# Patient Record
Sex: Female | Born: 1935 | Race: White | Hispanic: No | Smoking: Never smoker
Health system: Southern US, Community
[De-identification: ages and names within clinical notes are randomized; demographics above are authoritative.]

## PROBLEM LIST (undated history)

## (undated) DIAGNOSIS — I509 Heart failure, unspecified: Secondary | ICD-10-CM

## (undated) DIAGNOSIS — J189 Pneumonia, unspecified organism: Secondary | ICD-10-CM

## (undated) DIAGNOSIS — I10 Essential (primary) hypertension: Secondary | ICD-10-CM

## (undated) DIAGNOSIS — M545 Low back pain, unspecified: Secondary | ICD-10-CM

## (undated) DIAGNOSIS — E119 Type 2 diabetes mellitus without complications: Secondary | ICD-10-CM

## (undated) DIAGNOSIS — Z950 Presence of cardiac pacemaker: Secondary | ICD-10-CM

## (undated) DIAGNOSIS — I219 Acute myocardial infarction, unspecified: Secondary | ICD-10-CM

## (undated) DIAGNOSIS — M109 Gout, unspecified: Secondary | ICD-10-CM

## (undated) DIAGNOSIS — J90 Pleural effusion, not elsewhere classified: Secondary | ICD-10-CM

## (undated) DIAGNOSIS — R0602 Shortness of breath: Secondary | ICD-10-CM

## (undated) DIAGNOSIS — M1990 Unspecified osteoarthritis, unspecified site: Secondary | ICD-10-CM

## (undated) DIAGNOSIS — Z9289 Personal history of other medical treatment: Secondary | ICD-10-CM

## (undated) DIAGNOSIS — E7800 Pure hypercholesterolemia, unspecified: Secondary | ICD-10-CM

## (undated) DIAGNOSIS — G8929 Other chronic pain: Secondary | ICD-10-CM

## (undated) DIAGNOSIS — E039 Hypothyroidism, unspecified: Secondary | ICD-10-CM

## (undated) DIAGNOSIS — N184 Chronic kidney disease, stage 4 (severe): Secondary | ICD-10-CM

## (undated) HISTORY — PX: JOINT REPLACEMENT: SHX530

## (undated) HISTORY — PX: INSERT / REPLACE / REMOVE PACEMAKER: SUR710

---

## 1993-09-07 DIAGNOSIS — I219 Acute myocardial infarction, unspecified: Secondary | ICD-10-CM

## 1993-09-07 HISTORY — DX: Acute myocardial infarction, unspecified: I21.9

## 1993-09-07 HISTORY — PX: CORONARY ANGIOPLASTY: SHX604

## 1998-04-20 ENCOUNTER — Emergency Department (HOSPITAL_COMMUNITY): Admission: EM | Admit: 1998-04-20 | Discharge: 1998-04-20 | Payer: Self-pay | Admitting: Emergency Medicine

## 1998-09-07 HISTORY — PX: CORONARY ANGIOPLASTY WITH STENT PLACEMENT: SHX49

## 1999-03-31 ENCOUNTER — Observation Stay (HOSPITAL_COMMUNITY): Admission: AD | Admit: 1999-03-31 | Discharge: 1999-04-01 | Payer: Self-pay | Admitting: Cardiology

## 1999-08-25 ENCOUNTER — Emergency Department (HOSPITAL_COMMUNITY): Admission: EM | Admit: 1999-08-25 | Discharge: 1999-08-25 | Payer: Self-pay | Admitting: Emergency Medicine

## 1999-12-31 ENCOUNTER — Encounter: Admission: RE | Admit: 1999-12-31 | Discharge: 1999-12-31 | Payer: Self-pay | Admitting: Family Medicine

## 1999-12-31 ENCOUNTER — Encounter: Payer: Self-pay | Admitting: Family Medicine

## 2000-07-09 ENCOUNTER — Encounter: Payer: Self-pay | Admitting: Family Medicine

## 2000-07-09 ENCOUNTER — Encounter: Admission: RE | Admit: 2000-07-09 | Discharge: 2000-07-09 | Payer: Self-pay | Admitting: Family Medicine

## 2000-12-21 ENCOUNTER — Encounter: Admission: RE | Admit: 2000-12-21 | Discharge: 2000-12-21 | Payer: Self-pay | Admitting: Family Medicine

## 2000-12-21 ENCOUNTER — Encounter: Payer: Self-pay | Admitting: Family Medicine

## 2001-04-12 ENCOUNTER — Encounter: Payer: Self-pay | Admitting: Neurosurgery

## 2001-04-12 ENCOUNTER — Encounter: Admission: RE | Admit: 2001-04-12 | Discharge: 2001-04-12 | Payer: Self-pay | Admitting: Neurosurgery

## 2001-04-26 ENCOUNTER — Encounter: Admission: RE | Admit: 2001-04-26 | Discharge: 2001-04-26 | Payer: Self-pay | Admitting: Neurosurgery

## 2001-04-26 ENCOUNTER — Encounter: Payer: Self-pay | Admitting: Neurosurgery

## 2001-05-10 ENCOUNTER — Encounter: Payer: Self-pay | Admitting: Neurosurgery

## 2001-05-10 ENCOUNTER — Encounter: Admission: RE | Admit: 2001-05-10 | Discharge: 2001-05-10 | Payer: Self-pay | Admitting: Neurosurgery

## 2002-01-26 ENCOUNTER — Emergency Department (HOSPITAL_COMMUNITY): Admission: EM | Admit: 2002-01-26 | Discharge: 2002-01-26 | Payer: Self-pay

## 2002-03-17 ENCOUNTER — Encounter: Admission: RE | Admit: 2002-03-17 | Discharge: 2002-03-17 | Payer: Self-pay | Admitting: Family Medicine

## 2002-03-17 ENCOUNTER — Encounter: Payer: Self-pay | Admitting: Family Medicine

## 2002-03-20 ENCOUNTER — Encounter: Payer: Self-pay | Admitting: Nephrology

## 2002-03-20 ENCOUNTER — Encounter: Admission: RE | Admit: 2002-03-20 | Discharge: 2002-03-20 | Payer: Self-pay | Admitting: Nephrology

## 2006-11-05 ENCOUNTER — Encounter: Admission: RE | Admit: 2006-11-05 | Discharge: 2006-11-05 | Payer: Self-pay | Admitting: Family Medicine

## 2006-11-18 ENCOUNTER — Encounter: Admission: RE | Admit: 2006-11-18 | Discharge: 2006-11-18 | Payer: Self-pay | Admitting: Family Medicine

## 2007-04-15 ENCOUNTER — Ambulatory Visit: Admission: RE | Admit: 2007-04-15 | Discharge: 2007-04-15 | Payer: Self-pay | Admitting: General Surgery

## 2007-06-08 HISTORY — PX: HERNIA REPAIR: SHX51

## 2007-06-09 ENCOUNTER — Encounter (INDEPENDENT_AMBULATORY_CARE_PROVIDER_SITE_OTHER): Payer: Self-pay | Admitting: General Surgery

## 2007-06-11 ENCOUNTER — Inpatient Hospital Stay (HOSPITAL_COMMUNITY): Admission: AD | Admit: 2007-06-11 | Discharge: 2007-06-13 | Payer: Self-pay | Admitting: General Surgery

## 2007-07-09 HISTORY — PX: ABDOMINAL WALL MESH  REMOVAL: SHX1116

## 2007-08-01 ENCOUNTER — Inpatient Hospital Stay (HOSPITAL_COMMUNITY): Admission: RE | Admit: 2007-08-01 | Discharge: 2007-08-03 | Payer: Self-pay | Admitting: General Surgery

## 2007-09-08 HISTORY — PX: TOTAL HIP ARTHROPLASTY: SHX124

## 2007-09-21 ENCOUNTER — Encounter: Admission: RE | Admit: 2007-09-21 | Discharge: 2007-09-21 | Payer: Self-pay | Admitting: Anesthesiology

## 2007-12-14 ENCOUNTER — Encounter: Admission: RE | Admit: 2007-12-14 | Discharge: 2007-12-14 | Payer: Self-pay | Admitting: Family Medicine

## 2008-05-16 ENCOUNTER — Encounter: Admission: RE | Admit: 2008-05-16 | Discharge: 2008-05-16 | Payer: Self-pay | Admitting: Family Medicine

## 2008-07-16 ENCOUNTER — Inpatient Hospital Stay (HOSPITAL_COMMUNITY): Admission: RE | Admit: 2008-07-16 | Discharge: 2008-07-19 | Payer: Self-pay | Admitting: Orthopedic Surgery

## 2008-09-07 DIAGNOSIS — Z9289 Personal history of other medical treatment: Secondary | ICD-10-CM

## 2008-09-07 HISTORY — PX: TOTAL HIP ARTHROPLASTY: SHX124

## 2008-09-07 HISTORY — DX: Personal history of other medical treatment: Z92.89

## 2008-10-02 ENCOUNTER — Encounter: Admission: RE | Admit: 2008-10-02 | Discharge: 2008-10-02 | Payer: Self-pay | Admitting: Cardiology

## 2008-10-10 ENCOUNTER — Ambulatory Visit (HOSPITAL_COMMUNITY): Admission: RE | Admit: 2008-10-10 | Discharge: 2008-10-11 | Payer: Self-pay | Admitting: Cardiology

## 2009-01-29 ENCOUNTER — Inpatient Hospital Stay (HOSPITAL_COMMUNITY): Admission: RE | Admit: 2009-01-29 | Discharge: 2009-02-01 | Payer: Self-pay | Admitting: Orthopedic Surgery

## 2009-07-18 ENCOUNTER — Encounter: Admission: RE | Admit: 2009-07-18 | Discharge: 2009-07-18 | Payer: Self-pay | Admitting: General Surgery

## 2010-12-16 LAB — GLUCOSE, CAPILLARY
Glucose-Capillary: 135 mg/dL — ABNORMAL HIGH (ref 70–99)
Glucose-Capillary: 158 mg/dL — ABNORMAL HIGH (ref 70–99)
Glucose-Capillary: 172 mg/dL — ABNORMAL HIGH (ref 70–99)
Glucose-Capillary: 190 mg/dL — ABNORMAL HIGH (ref 70–99)
Glucose-Capillary: 238 mg/dL — ABNORMAL HIGH (ref 70–99)
Glucose-Capillary: 87 mg/dL (ref 70–99)

## 2010-12-16 LAB — DIFFERENTIAL
Basophils Absolute: 0 10*3/uL (ref 0.0–0.1)
Lymphocytes Relative: 21 % (ref 12–46)

## 2010-12-16 LAB — URINALYSIS, ROUTINE W REFLEX MICROSCOPIC
Glucose, UA: NEGATIVE mg/dL
Hgb urine dipstick: NEGATIVE
Ketones, ur: NEGATIVE mg/dL
Specific Gravity, Urine: 1.018 (ref 1.005–1.030)
Urobilinogen, UA: 1 mg/dL (ref 0.0–1.0)

## 2010-12-16 LAB — CBC
HCT: 28 % — ABNORMAL LOW (ref 36.0–46.0)
HCT: 40.7 % (ref 36.0–46.0)
Hemoglobin: 13.4 g/dL (ref 12.0–15.0)
Hemoglobin: 9 g/dL — ABNORMAL LOW (ref 12.0–15.0)
MCHC: 33.4 g/dL (ref 30.0–36.0)
MCV: 92.1 fL (ref 78.0–100.0)
Platelets: 144 10*3/uL — ABNORMAL LOW (ref 150–400)
RBC: 2.95 MIL/uL — ABNORMAL LOW (ref 3.87–5.11)
RDW: 13.2 % (ref 11.5–15.5)
WBC: 10.8 10*3/uL — ABNORMAL HIGH (ref 4.0–10.5)
WBC: 10.8 10*3/uL — ABNORMAL HIGH (ref 4.0–10.5)

## 2010-12-16 LAB — BASIC METABOLIC PANEL
BUN: 24 mg/dL — ABNORMAL HIGH (ref 6–23)
BUN: 26 mg/dL — ABNORMAL HIGH (ref 6–23)
BUN: 26 mg/dL — ABNORMAL HIGH (ref 6–23)
BUN: 37 mg/dL — ABNORMAL HIGH (ref 6–23)
CO2: 27 mEq/L (ref 19–32)
CO2: 28 mEq/L (ref 19–32)
Calcium: 7.5 mg/dL — ABNORMAL LOW (ref 8.4–10.5)
Calcium: 7.5 mg/dL — ABNORMAL LOW (ref 8.4–10.5)
Calcium: 9.1 mg/dL (ref 8.4–10.5)
Calcium: 9.4 mg/dL (ref 8.4–10.5)
Chloride: 101 mEq/L (ref 96–112)
Chloride: 103 mEq/L (ref 96–112)
Chloride: 103 mEq/L (ref 96–112)
Chloride: 99 mEq/L (ref 96–112)
Creatinine, Ser: 1.33 mg/dL — ABNORMAL HIGH (ref 0.4–1.2)
Creatinine, Ser: 1.52 mg/dL — ABNORMAL HIGH (ref 0.4–1.2)
Creatinine, Ser: 1.6 mg/dL — ABNORMAL HIGH (ref 0.4–1.2)
Creatinine, Ser: 2.33 mg/dL — ABNORMAL HIGH (ref 0.4–1.2)
GFR calc Af Amer: 25 mL/min — ABNORMAL LOW (ref >60)
GFR calc Af Amer: 44 mL/min — ABNORMAL LOW (ref >60)
GFR calc non Af Amer: 20 mL/min — ABNORMAL LOW (ref >60)
GFR calc non Af Amer: 33 mL/min — ABNORMAL LOW (ref >60)
Glucose, Bld: 179 mg/dL — ABNORMAL HIGH (ref 70–99)
Glucose, Bld: 211 mg/dL — ABNORMAL HIGH (ref 70–99)
Glucose, Bld: 98 mg/dL (ref 70–99)
Potassium: 3.7 mEq/L (ref 3.5–5.1)
Potassium: 4.7 mEq/L (ref 3.5–5.1)
Potassium: 5.5 mEq/L — ABNORMAL HIGH (ref 3.5–5.1)
Potassium: 6 mEq/L — ABNORMAL HIGH (ref 3.5–5.1)
Sodium: 132 mEq/L — ABNORMAL LOW (ref 135–145)
Sodium: 133 mEq/L — ABNORMAL LOW (ref 135–145)
Sodium: 142 mEq/L (ref 135–145)

## 2010-12-16 LAB — HEMOGLOBIN AND HEMATOCRIT, BLOOD
HCT: 25.3 % — ABNORMAL LOW (ref 36.0–46.0)
Hemoglobin: 8.3 g/dL — ABNORMAL LOW (ref 12.0–15.0)

## 2010-12-16 LAB — PROTIME-INR
INR: 1 (ref 0.00–1.49)
Prothrombin Time: 13.9 seconds (ref 11.6–15.2)

## 2010-12-16 LAB — TYPE AND SCREEN: ABO/RH(D): O POS

## 2010-12-16 LAB — URINE MICROSCOPIC-ADD ON

## 2010-12-16 LAB — APTT: aPTT: 29 seconds (ref 24–37)

## 2010-12-23 LAB — GLUCOSE, CAPILLARY
Glucose-Capillary: 129 mg/dL — ABNORMAL HIGH (ref 70–99)
Glucose-Capillary: 149 mg/dL — ABNORMAL HIGH (ref 70–99)
Glucose-Capillary: 57 mg/dL — ABNORMAL LOW (ref 70–99)

## 2011-01-20 NOTE — Discharge Summary (Signed)
Miranda Cruz, Miranda Cruz NO.:  [id number]   MEDICAL RECORD NO.:  [id number]          PATIENT TYPE:  INP   LOCATION:  1605                         FACILITY:  [facility]   PHYSICIAN:  Madlyn Frankel. Charlann Boxer, M.D.  DATE OF BIRTH:  20-Nov-1929   DATE OF ADMISSION:  07/16/2008  DATE OF DISCHARGE:  07/19/2008                               DISCHARGE SUMMARY   ADMISSION DIAGNOSES:  1. Osteoarthritis.  2. Hypertension.  3. Coronary artery disease.  4. Congestive heart failure.  5. Impaired hearing.  6. Diabetes.  7. Gout.   DISCHARGE DIAGNOSES:  1. Osteoarthritis.  2. Hypertension.  3. Coronary artery disease.  4. Congestive heart failure.  5. Impaired hearing.  6. Diabetes.  7. Gout.   HISTORY OF PRESENT ILLNESS:  A 77 year old female with a history of left  hip sit pain secondary to osteoarthritis.   PROCEDURE:  Left total hip replacement by surgeon Dr. Durene Romans,  assisted by Dwyane Luo, PA-C.   CONSULTATIONS:  None.   LABORATORY DATA:  CBC tracked over the course of her stay.  Final  reading showed white blood cells 10.2, hemoglobin 9.5, hematocrit 29,  platelet count 163,000.  Metabolic panel:  Sodium 139, potassium 3.7,  BUN 40, creatinine 1.69, glucose 50.  Coag's all within normal limits.  Urinalysis showed rare bacteria, negative for nitrites.  Calcium 7.8.   Radiology:  Chest, two view showed mild cardiomegaly, no active disease.   Cardiology, EKG showed sinus bradycardia with a first degree AV block,  left anterior fascicular block.   HOSPITAL COURSE:  The patient was admitted to the hospital and underwent  left total hip replacement and tolerated the procedure well.  The  patient was admitted to the orthopedic floor.  Her stay was unremarkable  although she made minimal progress throughout.  She did have some  hypotension and was given some volume.  She does have a history of  chronic renal insufficiency, borderline, which remained stable  throughout.   Her dressing was changed after day #1 with no significant  drainage from the wound.  She did have a couple hypoglycemic events.  Due to her limited progress, she was a candidate for a skilled nursing  facility rehab until she was able to safely ambulate.  Seen on July 19, 2008 she was ready to go, afebrile, dressing was changed, no active  drainage, neurovascularly intact.  She was weight-bearing as tolerated  with the use of a rolling walker and was ready to go.   DISPOSITION:  Discharged to skilled nursing facility rehab in stable and  improved condition.   DISCHARGE MEDICATIONS:  1. Lovenox 40 mg subcutaneously q.24h. x 11 days and then stop.  2. Enteric coated aspirin 325 mg one p.o. daily x 4 weeks after      Lovenox completed.  3. Robaxin 500 mg one p.o. q.6h. muscle spasms/pain.  4. Vicodin 7.5/325 one to two p.o. q.4h. for pain.  5. Iron 325 mg one p.o. t.i.d. x  3 weeks.  6. Colace 100 mg one p.o. b.i.d. constipation.  7. MiraLax 17 grams p.o. daily p.r.n. constipation while on narcotics.  8. Glyburide 5 mg one p.o. q.a.m.  9. Lipitor 20 mg one p.o. q.a.m.  10.Lisinopril 10 mg one p.o. q.a.m., hold while systolic less than      120.  11.Metoprolol 25 mg one p.o. q.a.m. and one p.o. q.p.m.  12.Furosemide 80 mg one p.o. q.a.m. and one p.o. q.p.m. if needed.  13.Allopurinol 100 mg two p.o. q.a.m.  14.Lovaza 1 gram, four q.a.m.  Discontinue if any bleeding problems.  15.Alprazolam 1 mg one p.o. nightly.  16.Qualaquin 324 mg one p.o. nightly p.r.n.  17.Levothyroxine 50 mcg one p.o. q.a.m.  18.Pepcid over the counter p.r.n.  19.Benadryl 25 mg one p.o. q.6h. p.r.n. itching.  20.Co-Q10 100 mg p.r.n.  21.Vitamin D, unknown dosage amount p.r.n.   ACTIVITY:  Physical therapy, weight-bearing as tolerated with use of her  own walker.  Goals of physical therapy will be to increase strength,  decrease pain and encourage independence of activities of daily living.  Increase her  strength and ability to get from a sitting to standing  position independently.   DISCHARGE WOUND CARE:  Keep dry.   DISCHARGE DIET:  She is diabetic, carb-modified diet.   DISCHARGE FOLLOWUP:  Follow up with Dr. Charlann Boxer at phone number [phone] in  two weeks for a wound check.     ______________________________  Yetta Glassman. [address]oreta [address], [place]      Madlyn Frankel. Charlann Boxer, M.D.  Electronically Signed    BLM/MEDQ  D:  07/19/2008  T:  07/19/2008  Job:  [id number]   cc:   Francisca December, M.D.  Fax: [phone]   Donia Guiles, M.D.  Fax: [phone]   Tamera C. Lewis, N.P.   Please fax copies to all.

## 2011-01-20 NOTE — H&P (Signed)
NAMERITAL, CAVEY NO.:  [id number]   MEDICAL RECORD NO.:  [id number]           PATIENT TYPE:   LOCATION:                                 FACILITY:   PHYSICIAN:  Madlyn Frankel. Charlann Boxer, M.D.  DATE OF BIRTH:  1930/02/07   DATE OF ADMISSION:  DATE OF DISCHARGE:                              HISTORY & PHYSICAL   Procedure will be a right total hip replacement.   CHIEF COMPLAINT:  Right hip pain.   HISTORY OF PRESENT ILLNESS:  This is a 77 year old female with a history  of right hip pain secondary to osteoarthritis.  It has been refractory  to all conservative treatment.  She does have a history of a left total  hip replacement November 2009 and has done very well with this.  She  does state she has had a pacemaker placed since that left total hip  replacement back in November 2009.  Other than the pacemaker, she has no  other recent significant health changes.   PAST MEDICAL HISTORY:  1. Osteoarthritis.  2. Coronary artery disease.  3. Hypertension.  4. Congestive heart failure.  5. Impaired hearing.  6. Diabetes.  7. Gout.  8. Pacemaker placement.   PAST SURGICAL HISTORY:  1. Pacemaker in 2010.  2. Left total hip placement in November 2009.  3. Hernia repair in 2008.   FAMILY HISTORY:  Kidney disease, appendicitis, stroke.   SOCIAL HISTORY:  She is widowed, a nonsmoker.  She does have family for  postoperative care in the home.   PRIMARY CARE PHYSICIAN:  Donia Guiles, M.D.   OTHER PHYSICIANS:  Include Francisca December, M.D., cardiologist, and Kathrin Penner. Vear Clock, M.D. for pain management.   MEDICATIONS:  1. Glyburide 5 mg 1 p.o. q.a.m.  2. Lipitor 20 mg 1 p.o. q.a.m.  3. Lisinopril 10 mg 1 p.o. q.a.m.  4. Levothyroxine 50 mcg 1-1/2 each morning.  5. Furosemide 80 mg 1 p.o. q.a.m. and bedtime as needed in the      evening.  6. Oxycodone 5 mg 1-2 p.o. q. 4-6 p.r.n.  7. Cyclobenzaprine 10 mg 1 p.o. q. 12 p.r.n.  8. Alprazolam 1 mg bedtime p.r.n.  9. Allopurinol 100 mg 2 p.o. daily.  10.Lovaza 1 g 4 per day.  11.Qualaquin 324 mg p.o. bedtime p.r.n.  12.Pantoprazole 40 mg 1 p.o. q.a.m.  13.Aspirin 325 mg 1 p.o. q.a.m.  14.Stool softeners p.r.n.  15.Benadryl p.r.n.  16.Aleve p.r.n.  17.CoQ-10 100 mg p.r.n.  18.Vitamin D p.r.n.   DRUG ALLERGIES:  To include PHENERGAN and MORPHINE.   REVIEW OF SYSTEMS:  HEENT:  She has hearing loss, wears dentures.  DERMATOLOGY:  She has intermittent rashes.  RESPIRATORY:  She does have wheezing.  MUSCULOSKELETAL:  She has joint pain, back pain, morning stiffness.  Otherwise see HPI.   PHYSICAL EXAMINATION:  Pulse 64, respirations 16, blood pressure 148/76.  GENERAL:  Awake, alert and oriented.  HEENT:  Normocephalic.  NECK:  Supple.  No carotid bruit.  CHEST:  Right lungs are clear to auscultation.  Left lungs  upper and  lower have inspiratory wheeze and rub.  BREASTS:  Deferred.  HEART:  S1-S2 distinct.  ABDOMEN:  Soft, bowel sounds present.  PELVIS:  Stable.  GENITOURINARY:  Deferred.  EXTREMITIES:  Right hip has increased pain with range of motion.  SKIN:  Posterolateral scar left hip.  Right hip.  No cellulitis.  No  skin breakdown.  NEUROLOGIC:  Intact distal sensibilities.   LABORATORY DATA:  Labs, EKG, chest x-ray pending presurgical testing.   IMPRESSION:  Right hip osteoarthritis.   PLAN OF ACTION:  Right total hip replacement by Dr. Charlann Boxer at [facility]  on Jan 29, 2009.  Risks and complications were discussed.   The patient is planning on home health care physical therapy, and  Lovenox was provided at today's history and physical as well as other  postoperative medications.     ______________________________  Yetta Glassman [address], [place]      Madlyn Frankel. Charlann Boxer, M.D.  Electronically Signed    BLM/MEDQ  D:  01/18/2009  T:  01/18/2009  Job:  [id number]   cc:   Donia Guiles, M.D.  Fax: [phone]   Francisca December, M.D.  Fax: [phone]   Mark L. Vear Clock, M.D.  Fax:  [phone]

## 2011-01-20 NOTE — Op Note (Signed)
Miranda Cruz, Miranda Cruz NO.:  [id number]   MEDICAL RECORD NO.:  [id number]          PATIENT TYPE:  OIB   LOCATION:  5711                         FACILITY:  [facility]   PHYSICIAN:  Ollen Gross. Vernell Morgans, M.D. DATE OF BIRTH:  Oct 09, 1929   DATE OF PROCEDURE:  06/09/2007  DATE OF DISCHARGE:                               OPERATIVE REPORT   PREOPERATIVE DIAGNOSIS:  Ventral hernia.   POSTOPERATIVE DIAGNOSIS:  Ventral hernia.   PROCEDURES:  Ventral hernia repair with mesh.   SURGEON:  Ollen Gross. Vernell Morgans, M.D.   ASSISTANTMarcial Pacas E. Earlene Plater, M.D.   ANESTHESIA:  General endotracheal.   PROCEDURE:  After informed consent was obtained, the patient was brought  to the operating room and placed in a supine position on the operating  table.  After adequate induction of general anesthesia, the patient's  abdomen was prepped with Betadine and draped in the usual sterile  manner.  A midline incision was made overlying the hernia with a 10  blade knife.  The hernia was not reducible.  This incision was carried  down through the skin into the subcutaneous tissue sharply with the  electrocautery.  The hernia was identified; it was separated from the  rest of the subcutaneous tissue by combination of blunt finger  dissection and some sharp dissection with the electrocautery.  The  hernia sac was then opened sharply with the cautery.  There was some  colon and omentum within the hernia sac.  The omentum was easily able to  be separated from the wall of the hernia sac by sharp dissection with  the electrocautery.  Once this was accomplished, the hernia contents  were free of any adhesions to the hernia sac.  The sac was excised at  its base sharply with the electrocautery.  The midline incision was  opened superiorly a little bit more sharply with the electrocautery  under direct vision; this allowed [place] to reduce the contents of the  hernia back into the abdomen.  The bowel appeared to be  healthy.  Next,  subcutaneous dissection on top of the anterior abdominal wall fascia was  performed sharply with electrocautery for several centimeters  circumferentially around the defect.  A piece of 15 x 20-cm Proceed mesh  was chosen.  The mesh was anchored through the abdominal wall, on the  inside of the abdominal wall, with full-thickness #1 Novofil stitches;  these were brought through the abdominal wall, then through the mesh,  then back through the abdominal wall and again, this was done  circumferentially around the defect several centimeters out from the  edge of the defect in a manner so that there were no gaps between the  stitches.  Once all the stitches were placed, all of the stitches were  then pulled up.  Care was taken to make sure that no intra-abdominal  intestine or contents were being pinched by the sutures as they were  pulled up.  All of the stitches were then cinched down and tied and once  this was accomplished, the  mesh appeared to be in good position beneath  the fascia, covering the hernia defect.  The mesh was oriented with the  blue side towards the ceiling.  and the coated side towards the  intestine.  Once this was accomplished, the abdomen was then irrigated  with copious amounts of saline.  The fascia of the abdominal wall was  then closed over the top of the mesh with a running #1 Novofil stitch.  The subcutaneous tissue was then irrigated again with saline.  Some of  thinned-out umbilical skin appeared to be dusky and this was excised  sharply with a 10 blade knife and again, hemostasis was then achieved  using the Bovie electrocautery.  A small right lower quadrant stab  incision was made with a 15 blade knife.  A hemostat was brought through  this incision into the operative bed and a 19-French round Blake drain  was brought through this opening into the operative bed and then out the  skin.  The drain was placed in the subcutaneous space.  The  drain was  anchored to the skin with a 3-0 nylon stitch.  The skin was then closed  with staples.  The drain was placed to bulb suction.  Betadine ointment  and sterile dressings were then applied.  The patient tolerated the  procedure well.  At the end of the case, all needle, sponge and  instrument counts were correct.  The patient was then awakened and taken  to Recovery in stable condition.      Ollen Gross. Vernell Morgans, M.D.  Electronically Signed     PST/MEDQ  D:  06/09/2007  T:  06/10/2007  Job:  [id number]

## 2011-01-20 NOTE — Op Note (Signed)
Miranda Cruz, Miranda Cruz NO.:  [id number]   MEDICAL RECORD NO.:  [id number]          PATIENT TYPE:  INP   LOCATION:  2550                         FACILITY:  [facility]   PHYSICIAN:  Ollen Gross. Vernell Morgans, M.D. DATE OF BIRTH:  [birth year]   DATE OF PROCEDURE:  08/01/2007  DATE OF DISCHARGE:                               OPERATIVE REPORT   PREOPERATIVE DIAGNOSIS:  Infected mesh open abdominal wound.   POSTOPERATIVE DIAGNOSIS:  Infected mesh open abdominal wound.   PROCEDURE:  Removal of infected mesh, placement of vac dressing to  abdominal wound.   SURGEON:  Ollen Gross. Vernell Morgans, M.D.   ASSISTANT:  Wilmon Arms. Tsuei, M.D.   ANESTHESIA:  General endotracheal.   DESCRIPTION OF PROCEDURE:  After informed consent was obtained, the  patient was brought to the operating room and placed in the supine  position on the operating table.  After adequate induction of general  anesthesia, the patient's abdomen was prepped with Betadine and draped  in the usual sterile manner. The patient had an open midline wound. At  the base of the wound, there was a small quarter-sized piece of exposed  mesh. The wound was actually very clean along the base and the sidewalls  except for where the exposed mesh was. The wound was then examined.  There was a small bit of necrotic fat on the left lateral sidewall of  the wound that was debrided with Metzenbaum scissors, and [place]  pickups. Other than that though, the rest of the wound had intact fascia  covering the mesh. Only a quarter size piece of mesh was actually  exposed and there was no undermining to the mesh, other than in the area  that was exposed, appeared to be well incorporated into the abdominal  wall. At this point, the quarter-sized piece of mesh that was exposed  was removed sharply with Metzenbaum scissors.  The wound was then pulse  lavaged with antibiotic solution and then a vacuum dressing was applied  using a small piece of white  foam over the defect where the mesh was  exposed and another piece of white foam on the lateral sidewall on the  left. The dressing was then placed through to vacuum suction. The  patient tolerated the procedure well. At the end of the case, all  needle, sponge and instrument counts were correct.  The patient was then  awakened and taken to recovery in stable condition.      Ollen Gross. Vernell Morgans, M.D.  Electronically Signed     PST/MEDQ  D:  08/01/2007  T:  08/01/2007  Job:  [id number]

## 2011-01-20 NOTE — Op Note (Signed)
Miranda Cruz, Miranda Cruz NO.:  [id number]   MEDICAL RECORD NO.:  [id number]          PATIENT TYPE:  INP   LOCATION:  1611                         FACILITY:  [facility]   PHYSICIAN:  Madlyn Frankel. Charlann Boxer, M.D.  DATE OF BIRTH:  August 09, 1930   DATE OF PROCEDURE:  01/29/2009  DATE OF DISCHARGE:  02/01/2009                               OPERATIVE REPORT   PREOPERATIVE DIAGNOSIS:  Right hip osteoarthritis.   POSTOPERATIVE DIAGNOSIS:  Right hip osteoarthritis.   PROCEDURE:  Right total hip replacement.   COMPONENTS USED:  A DePuy hip system, size 58 Pinnacle cup, a 3 high  Trilock stem with a 32 +4 neutral Marathon liner and a 32 +1 ball.   SURGEON:  Madlyn Frankel. Charlann Boxer, M.D.   ASSISTANT:  Yetta Glassman. Marcellus Scott.   ANESTHESIA:  General.   BLOOD LOSS:  Less than 300 mL.   DRAINS:  One Hemovac.   FINDINGS:  None.   SPECIMENS:  None.   COMPLICATIONS:  None.   INDICATION:  Miranda Cruz is a 77 year old female, seen and evaluated in the  office for left-hip osteoarthritis.  She had failed conservative  measures and had a significant reduction obtuse marginal her quality of  life.  She had a history of a left total hip replacement that she has  done very well with and she wishes, at this point, to proceed with right-  hip surgery.  We reviewed the risks and benefits of this surgery, which  she went through before.  Questions were encouraged and consent  obtained.   PROCEDURE IN DETAIL:  The patient was brought to operative theater.  Once adequate anesthesia, preoperative antibiotics, Ancef, administered,  the patient was carefully positioned into the left lateral decubitus  position, right-side up, identifying landmarks for leg-length  evaluation.   The right lower extremity prescrubbed, prepped and draped in sterile  fashion.  Time-out was performed, identifying the patient, planned  procedure and extremity.  A lateral-based incision was then made.  Sharp  dissection carried to  the iliotibial band and gluteal fascia, which were  then incised posteriorly.  The gluteus maximus was split.  The short  external rotators were identified and taken down with the posterior  capsule preserved for later repair, but also protect the sciatic nerve  from retractors.  The hip was dislocated.  She was noted preoperatively  to have a little bit of protrusio, but the hip dislocated without  complication, no posterior wall issues.  A neck osteotomy was made,  based off anatomic landmarks, comparing this to the contralateral hip.   I began on the femoral side, broaching the femur up to a size 3.  Once I  had gotten a good medial and lateral fit in the proximal metaphysis, I  packed the femur off with a sponge and attended to the acetabulum.  Acetabular reaming commenced and was carried up to a 47 reamer with good  bony bed preparation.  I subsequently impacted the 48-mm Pinnacle cup  with good, secure fit.   A single cancellous screw was utilized  to help support the cup.  A trial  32 +4 liner was placed and trial reduction carried out.  Trial reduction  was carried out with 3 high offset stem and a 32 +1 ball.  With this,  the hip stability appeared to be very good without evidence of  impingement or subluxation, tolerating internal rotation to 70-80  degrees with neutral abduction and hip flexion to 90 degrees, no  evidence of impingement with extension, external rotation.  Given all  these parameters, the trial components were all removed, final bone  preparation carried out.  The final hole eliminator and 32 +4 neutral  Marathon liner were then inserted and with good visualized rim fit.  The  final 3 high Trilock stem was then impacted, sitting at the level where  the broach was.  Based on this and my trial reduction, we chose a 32  +1  ball.  This was impacted onto a cleaned and dried trunnion.  The hip  joint was irrigated throughout the case, again at this point.  I   reapproximated the posterior capsular tissue to superior leaflet, placed  a medium Hemovac drain deep.  The iliotibial band and gluteal fascia  were then reapproximated over this.  The remainder of the wound was  closed with 2-0 Vicryl and running 4-0 Monocryl.  The hip was cleaned,  dried and dressed sterilely with sterile dressings.  The patient was  brought to the recovery room in stable condition, tolerating the  procedure well.      Madlyn Frankel Charlann Boxer, M.D.  Electronically Signed     MDO/MEDQ  D:  02/13/2009  T:  02/13/2009  Job:  [id number]

## 2011-01-20 NOTE — Op Note (Signed)
Miranda Cruz, Miranda Cruz NO.:  [id number]   MEDICAL RECORD NO.:  [id number]          PATIENT TYPE:  INP   LOCATION:  0002                         FACILITY:  [facility]   PHYSICIAN:  Madlyn Frankel. Charlann Boxer, M.D.  DATE OF BIRTH:  May 24, 1930   DATE OF PROCEDURE:  07/16/2008  DATE OF DISCHARGE:                               OPERATIVE REPORT   PREOPERATIVE DIAGNOSIS:  Left hip osteoarthritis.  Slight acetabular  protrusio.   POSTOPERATIVE DIAGNOSIS:  Left hip osteoarthritis.  Slight acetabular  protrusio.   PROCEDURE:  Left total hip placement.   COMPONENTS USED:  A DePuy hip system with a 48 pinnacle cup, single  cancellous screw hole eliminator, and 32 +4 neutral liner, a size 4  standard stem with a 32 +1 ball.   SURGEON:  Madlyn Frankel. Charlann Boxer, M.D.   ASSISTANT:  Yetta Glassman. Mann, PA   ANESTHESIA:  General.   BLOOD LOSS:  400 mL.   SPECIMENS:  None.   COMPLICATIONS:  None.   DRAINS:  One medium Hemovac.   INDICATIONS FOR PROCEDURE:  [name] is a 77 year old female with medical  history of diabetes, hypertension.  She presented to office with end-  stage bilateral hip arthritis, left greater than right.  She failed  conservative measures and despite risk of infection, DVT, component  failure, need for revision.  She wished to proceed with surgery for  benefit of pain relief.  Consent was obtained after the discussion,  particularly outlining concern for infection given her diabetes.  Consent was obtained.   PROCEDURE IN DETAIL:  The patient was brought to operative theater.  Once adequate anesthesia preoperative antibiotics, Ancef administered  the patient was positioned in the right lateral decubitus position with  left side up.  She left lower extremity pre scrubbed and prepped and  draped in sterile fashion.  The lateral based incision was made for  posterior approach to the hip.  The iliotibial band and gluteal fascia  incised posteriorly.  The short external  rotators were taken down  separate from posterior capsule.  The hip was dislocated.  The patient  had a calcified labrum circumferentially.   The neck osteotomy was made utilizing a standard neck with a +1 ball.  Based on the center of the head.   The following neck osteotomy I tended to the femur first.  Femoral  exposure was obtained.  I opened the canal with starting drill,  irrigated to prevent fat emboli and then broached up to a size 4 using a  calcar planer to finish off some anterior bone.   The femur was then retracted anteriorly.  The residual labrum and  calcified labrum was debrided.  I began reaming 43 reamer, reamed up to  47 reamer and chose a 48 pinnacle cup.  This was impacted at approximate  35-40 degrees of abduction, 20 degrees of forward flexion.  It was  checked with a guide.  Single cancellous screw was used to support the  initial scratch-fit fixation.   At this point trial neutral 33 +4 neutral liner  was placed.   Trial reduction carried out with 4 broach, 4 standard neck and a 32 +1  ball.  I was happy with leg lengths compared to the down leg when I  preoperatively positioned hip stability.  Given all these parameters the  trial components removed.  A hole eliminator was placed.  The final 32  +1 neutral liner was impacted with visually well seated within the cup.  The final 4 standard stem was impacted level of the broach and 32 +1  ball was impacted onto dry trunnion.  Hip was reduced irrigated  throughout the case again this point.  I was unable to reapproximate the  posterior capsule superior leaflet so I debrided some of this to prevent  impingement.  This was due to debridement anteriorly with the capsule  and calcified labrum.   The iliotibial band and gluteal fascia was then reapproximated using #1  Vicryl.  The remaining wound was closed 2-0 Vicryl and running 4-0  Monocryl.  The hip was cleaned, dried and dressed sterilely with a  Mepilex  dressing.  He is brought to recovery in stable condition  tolerating the procedure well.      Madlyn Frankel Charlann Boxer, M.D.  Electronically Signed     MDO/MEDQ  D:  07/16/2008  T:  07/16/2008  Job:  [id number]

## 2011-01-23 NOTE — Discharge Summary (Signed)
NAMETIONNE, CARELLI NO.:  [id number]   MEDICAL RECORD NO.:  [id number]          PATIENT TYPE:  INP   LOCATION:  1611                         FACILITY:  [facility]   PHYSICIAN:  Madlyn Frankel. Charlann Boxer, M.D.  DATE OF BIRTH:  [birth year]   DATE OF ADMISSION:  01/29/2009  DATE OF DISCHARGE:  02/01/2009                               DISCHARGE SUMMARY   ADMITTING DIAGNOSES:  1. Osteoarthritis.  2. Coronary artery disease.  3. Hypertension.  4. Congestive heart failure.  5. Impaired hearing.  6. Diabetes.  7. Gout.  8. Pacemaker placement.   DISCHARGE DIAGNOSES:  1. Osteoarthritis.  2. Coronary artery disease.  3. Hypertension.  4. Congestive heart failure.  5. Impaired hearing.  6. Diabetes.  7. Gout.  8. Pacemaker placement.  9. Acute blood loss anemia treated with iron.   HISTORY OF PRESENT ILLNESS:  A 77 year old female with a history of  right hip pain secondary to osteoarthritis refractory to all  conservative treatment.  Has a history of a left total hip replacement  and has done well.   CONSULTATIONS:  None.   PROCEDURE:  A right total hip replacement by Dr. Durene Romans.  Assistant - Environmental health practitioner PA-C.   LABORATORY DATA:  CBC final reading - white blood cells 10.8, hemoglobin  8.3, hematocrit 25.3, platelets 144.  Metabolic panel - sodium 132,  potassium 3.7, BUN 26, creatinine 1.33, glucose 98.  Calcium 7.3.   HOSPITAL COURSE:  The patient underwent a right total hip replacement  and was admitted to the orthopedic floor.  Her stay was unremarkable.  She remained orthopedically stable.  Hemodynamically, she did have a  drop in her hemoglobin, was anemic.  Did transfuse 1 unit of packed red  blood cells prior to discharge, as well as iron.  Otherwise, she did  well with physical therapy, was weightbearing as tolerated and met all  criteria for discharge home by day three.   DISCHARGE DISPOSITION:  Discharged home in stable and improved  condition.   DISCHARGE DIET:  Heart-healthy.   DISCHARGE WOUND CARE:  Keep dry.   DISCHARGE MEDICATIONS:  1. Oxycodone 5 mg one to three tablets p.o. q.4-6h. p.r.n. pain.  2. Robaxin 500 mg p.o. q.6h.  3. Enteric-coated aspirin 325 mg one p.o. b.i.d. x6 weeks, then daily.  4. Colace 100 mg p.o. b.i.d.  5. MiraLax 17 grams p.o. daily.  6. Lipitor 40 mg half tab q.a.m.  7. Glyburide 5 mg p.o. q.a.m.  8. Levothyroxine 50 mcg 1-1/2 tabs q.a.m.  9. Qualaquin 324 mg q.h.s. p.r.n.  10.Furosemide 80 mg p.o. q.a.m. and q.p.m. p.r.n.  11.Pantoprazole 40 mg p.o. q.a.m.  12.Allopurinol 100 mg two times a day.  13.Alprazolam 1 mg p.o. q.h.s. p.r.n.  14.Cyclobenzaprine 10 mg p.r.n.  15.Lovaza 1 gm twice daily.  16.Vitamin D 1000 units daily.  17.CoQ10 100 mg p.o. daily.  18.Benadryl p.r.n.   DISCHARGE FOLLOWUP:  Follow with Dr. Charlann Boxer at phone number [phone] in 2  weeks for wound check.     ______________________________  Yetta Glassman. [address]oreta [address], [place]  Madlyn Frankel Charlann Boxer, M.D.  Electronically Signed    BLM/MEDQ  D:  02/07/2009  T:  02/07/2009  Job:  [id number]   cc:   Donia Guiles, M.D.  Fax: [phone]   Corliss Marcus, M.D.   Kathrin Penner. Vear Clock, M.D.  Fax: [phone]

## 2011-01-23 NOTE — Discharge Summary (Signed)
NAMEESTEPHANY, PEROT NO.:  [id number]   MEDICAL RECORD NO.:  [id number]          PATIENT TYPE:  INP   LOCATION:  5711                         FACILITY:  [facility]   PHYSICIAN:  Ollen Gross. Vernell Morgans, M.D. DATE OF BIRTH:  1929/10/18   DATE OF ADMISSION:  06/09/2007  DATE OF DISCHARGE:  06/13/2007                               DISCHARGE SUMMARY   HISTORY OF PRESENT ILLNESS:  Ms. Anglemyer is a 77 year old white female who  was brought to the operating room with a ventral hernia.  She underwent  a ventral hernia repair with mesh which she tolerated well.  Drain was  left in place postoperatively.  She had some issues with pain control  postoperatively, but we added Toradol to her pain medicine regimen.  This helped a lot.  Her diet was slowly advanced, and by October 6, she  was tolerating a diet.  Her pain was under control.  She was ambulating  and her JP was left in, and she was discharged home.   MEDICATIONS AT TIME OF DISCHARGE:  She was to resume her home medicines.  She was given a prescription for pain medicine.   DISCHARGE INSTRUCTIONS:  1. Her activity is no heavy lifting.  2. Diet is as tolerated.   FINAL DIAGNOSIS:  Ventral hernia repair with mesh and follow-up with Dr.  Carolynne Edouard in one week.      Ollen Gross. Vernell Morgans, M.D.  Electronically Signed     PST/MEDQ  D:  08/16/2007  T:  08/16/2007  Job:  [id number]

## 2011-01-23 NOTE — H&P (Signed)
NAMESHERRYLL, SKOCZYLAS NO.:  [id number]   MEDICAL RECORD NO.:  [id number]          PATIENT TYPE:  INP   LOCATION:                               FACILITY:  [facility]   PHYSICIAN:  Madlyn Frankel. Charlann Boxer, M.D.  DATE OF BIRTH:  09-11-1929   DATE OF ADMISSION:  07/16/2008  DATE OF DISCHARGE:                              HISTORY & PHYSICAL   PROCEDURE:  Left total hip replacement.   CHIEF COMPLAINT:  Left hip pain.   HISTORY OF PRESENT ILLNESS:  A 77 year old female with a history of left  hip pain secondary to osteoarthritis.  It had been refractory to all  conservative treatment.  She had been presurgical assessed by Dr.  Arvilla Market and Dr. Amil Amen.   PRIMARY CARE PHYSICIAN:  Donia Guiles, M.D.   CARDIOLOGIST:  Dr. Corliss Marcus.   Also sees Dr. Thyra Breed.   PAST MEDICAL HISTORY:  Significant for  1. Osteoarthritis.  2. Hypertension.  3. Coronary artery disease.  4. Congestive heart failure.  5. Impaired hearing.  6. Diabetes.  7. Gout.   PAST SURGICAL HISTORY:  1. Angioplasty in 1995.  2. Hernia repair.   FAMILY HISTORY:  Kidney failure, appendicitis and stroke.   SOCIAL HISTORY:  She is widowed.  Primary caregiver will have family in  her home postoperatively and is planning on going home provided she  makes enough progress postoperatively.   DRUG ALLERGIES:  PHENERGAN and MORPHINE.   MEDICATIONS:  1. Levothyroxine 50 mcg half tablet q.a.m. This is a 25 mcg dosage.  2. Glyburide 5 mg one p.o. q.a.m.  3. Lipitor 20 mg one p.o. q.a.m.  4. Lisinopril 10 mg one p.o. q.a.m.  5. Metoprolol 25 mg one p.o. q.a.m. and one p.o. q.p.m.  6. Furosemide 80 mg one p.o. q.a.m. and one p.o. nightly p.r.n. only.  7. Oxycodone 5 mg 1-2 tablets p.o. q.6 h p.r.n. pain.  8. Flexeril 10 mg one p.o. q.12 h p.r.n.  9. Alprazolam 1 mg p.o. q.h.s.  10.Allopurinol 100 mg two p.o. daily.  11.Lovaza 1 gram 4 daily.  12.Solaquin 324 mg one q.h.s.  13.Aspirin 325 mg one q.a.m.  14.Pepcid p.r.n.  15.Benadryl p.r.n.  16.Aleve p.r.n.  17.Co-Q 10 100 mg p.r.n.  18.Vitamin D p.r.n.   REVIEW OF SYSTEMS:  HEENT:  She has hearing loss, wears dentures.  DERMATOLOGY:  Has intermittent rashes.  CARDIOVASCULAR:  She has  swelling, history of CHF.  GASTROINTESTINAL:  Some constipation.  GENITOURINARY:  She has incontinence issues.  MUSCULOSKELETAL:  Joint  pain, joint swelling, back pain and spasms, morning stiffness and muscle  weakness.  Utilizes a cane to assist in ambulation.  Otherwise see HPI.   PHYSICAL EXAMINATION:  VITAL SIGNS:  Pulse 72, respirations 16, blood  pressure 110/56.  GENERAL:  Awake, alert and oriented, well-developed, well-nourished.  NECK:  Supple.  No carotid bruits.  CHEST/LUNGS:  Clear to auscultation bilaterally.  BREASTS:  Deferred.  HEART:  Regular rate and rhythm.  S1-S2 distinct.  ABDOMEN:  Soft, nontender, obese.  Bowel sounds present.  GENITOURINARY:  Deferred.  EXTREMITIES:  Left hip has increased pain with range of motion.  Her hip  flexor strength is just 2+/5.  SKIN:  No cellulitis.  NEUROLOGIC:  Intact distal sensibilities.   LABORATORY DATA:  Labs, EKG, chest x-ray pending presurgical testing.  Previous EKG and surgical clearance had sinus bradycardia with first-  degree AV block, also had a right bundle branch block, has had  cardiology clearance for surgery.   IMPRESSION:  Left hip osteoarthritis.   PLAN OF ACTION:  Left total hip replacement at [facility] on  July 16, 2008, by surgeon, Dr. Durene Romans.  Risks and  complications were discussed.  Clearance was provided.   No postoperative medicines provided at time of history and physical as  the patient may require short-term rehab postoperatively.     ______________________________  Yetta Glassman [address], [place]      Madlyn Frankel. Charlann Boxer, M.D.  Electronically Signed    BLM/MEDQ  D:  06/21/2008  T:  06/21/2008  Job:  [id number]   cc:   Francisca December, M.D.   Fax: [phone]   Donia Guiles, M.D.  Fax: [phone]   Tamera C. Charma IgoP.

## 2011-06-09 LAB — GLUCOSE, CAPILLARY
Glucose-Capillary: 129 — ABNORMAL HIGH
Glucose-Capillary: 185 — ABNORMAL HIGH
Glucose-Capillary: 191 — ABNORMAL HIGH
Glucose-Capillary: 199 — ABNORMAL HIGH
Glucose-Capillary: 202 — ABNORMAL HIGH
Glucose-Capillary: 207 — ABNORMAL HIGH
Glucose-Capillary: 266 — ABNORMAL HIGH
Glucose-Capillary: 295 — ABNORMAL HIGH
Glucose-Capillary: 67 — ABNORMAL LOW

## 2011-06-09 LAB — CBC
HCT: 29 — ABNORMAL LOW
HCT: 33.3 — ABNORMAL LOW
HCT: 41.7
Hemoglobin: 13.5
Hemoglobin: 9.5 — ABNORMAL LOW
MCHC: 32.3
MCHC: 33.5
MCV: 91.7
MCV: 91.8
MCV: 91.9
Platelets: 163
Platelets: 180
RBC: 4.54
RDW: 13.8
RDW: 14.1
RDW: 14.4

## 2011-06-09 LAB — DIFFERENTIAL
Basophils Absolute: 0
Basophils Relative: 0
Eosinophils Absolute: 0.2
Eosinophils Relative: 2
Monocytes Absolute: 0.5
Monocytes Relative: 6

## 2011-06-09 LAB — BASIC METABOLIC PANEL
BUN: 39 — ABNORMAL HIGH
BUN: 40 — ABNORMAL HIGH
CO2: 30
Calcium: 9.3
Chloride: 102
Chloride: 103
Chloride: 104
Creatinine, Ser: 1.45 — ABNORMAL HIGH
GFR calc Af Amer: 36 — ABNORMAL LOW
GFR calc Af Amer: 39 — ABNORMAL LOW
GFR calc non Af Amer: 30 — ABNORMAL LOW
Glucose, Bld: 133 — ABNORMAL HIGH
Glucose, Bld: 75
Potassium: 3.7
Potassium: 4.1
Potassium: 5.6 — ABNORMAL HIGH
Sodium: 139
Sodium: 140

## 2011-06-09 LAB — URINALYSIS, ROUTINE W REFLEX MICROSCOPIC
Bilirubin Urine: NEGATIVE
Hgb urine dipstick: NEGATIVE
Specific Gravity, Urine: 1.017
Urobilinogen, UA: 1
pH: 5.5

## 2011-06-09 LAB — APTT: aPTT: 41 — ABNORMAL HIGH

## 2011-06-09 LAB — TYPE AND SCREEN: ABO/RH(D): O POS

## 2011-06-09 LAB — POCT I-STAT 4, (NA,K, GLUC, HGB,HCT)
Glucose, Bld: 182 — ABNORMAL HIGH
HCT: 43

## 2011-06-09 LAB — URINE MICROSCOPIC-ADD ON

## 2011-06-16 LAB — BASIC METABOLIC PANEL
BUN: 32 — ABNORMAL HIGH
CO2: 30
Calcium: 8.2 — ABNORMAL LOW
Chloride: 101
Creatinine, Ser: 1.38 — ABNORMAL HIGH
Creatinine, Ser: 1.53 — ABNORMAL HIGH
GFR calc Af Amer: 40 — ABNORMAL LOW
GFR calc non Af Amer: 33 — ABNORMAL LOW
GFR calc non Af Amer: 37 — ABNORMAL LOW
Glucose, Bld: 202 — ABNORMAL HIGH
Sodium: 139

## 2011-06-16 LAB — CBC
HCT: 36.1
Hemoglobin: 9.8 — ABNORMAL LOW
MCHC: 32.6
MCV: 89.1
Platelets: 358
RBC: 3.38 — ABNORMAL LOW
RDW: 16.3 — ABNORMAL HIGH
WBC: 12.5 — ABNORMAL HIGH
WBC: 9.2

## 2011-06-16 LAB — DIFFERENTIAL
Basophils Absolute: 0.1
Basophils Relative: 1
Eosinophils Absolute: 0.6
Eosinophils Relative: 4
Lymphs Abs: 2.5
Neutrophils Relative %: 71

## 2011-06-18 LAB — CBC
HCT: 35.3 — ABNORMAL LOW
Hemoglobin: 11.6 — ABNORMAL LOW
MCHC: 32.8
MCHC: 32.8
MCV: 89.5
MCV: 91.1
Platelets: 206
RBC: 4.19
RDW: 14.3 — ABNORMAL HIGH
WBC: 10.3

## 2011-06-18 LAB — BASIC METABOLIC PANEL
BUN: 53 — ABNORMAL HIGH
BUN: 53 — ABNORMAL HIGH
CO2: 28
CO2: 29
Calcium: 8.3 — ABNORMAL LOW
Calcium: 8.4
Calcium: 8.8
Chloride: 101
Chloride: 103
Chloride: 105
Creatinine, Ser: 1.94 — ABNORMAL HIGH
Creatinine, Ser: 1.37 — ABNORMAL HIGH
GFR calc Af Amer: 30 — ABNORMAL LOW
GFR calc Af Amer: 46 — ABNORMAL LOW
GFR calc non Af Amer: 28 — ABNORMAL LOW
GFR calc non Af Amer: 38 — ABNORMAL LOW
Glucose, Bld: 139 — ABNORMAL HIGH
Glucose, Bld: 177 — ABNORMAL HIGH
Potassium: 4.1
Potassium: 5.1
Sodium: 138
Sodium: 142
Sodium: 140

## 2011-06-18 LAB — DIFFERENTIAL
Basophils Absolute: 0
Basophils Relative: 1
Eosinophils Absolute: 0.2
Eosinophils Relative: 2
Lymphocytes Relative: 17
Lymphocytes Relative: 27
Lymphs Abs: 1.6
Lymphs Abs: 2.8
Monocytes Absolute: 0.7
Monocytes Relative: 5
Neutro Abs: 6.4
Neutrophils Relative %: 62

## 2011-06-22 LAB — DIFFERENTIAL
Basophils Absolute: 0
Eosinophils Relative: 4
Lymphocytes Relative: 21
Lymphs Abs: 2.4
Monocytes Absolute: 0.6

## 2011-06-22 LAB — CBC
HCT: 40.7
Hemoglobin: 13.6
RDW: 14

## 2011-06-22 LAB — BASIC METABOLIC PANEL
Chloride: 100
GFR calc non Af Amer: 28 — ABNORMAL LOW
Glucose, Bld: 115 — ABNORMAL HIGH
Potassium: 5.9 — ABNORMAL HIGH
Sodium: 139

## 2012-05-08 HISTORY — PX: CATARACT EXTRACTION W/ INTRAOCULAR LENS IMPLANT: SHX1309

## 2013-01-31 ENCOUNTER — Inpatient Hospital Stay (HOSPITAL_COMMUNITY): Payer: Medicare Other

## 2013-01-31 ENCOUNTER — Encounter (HOSPITAL_COMMUNITY): Payer: Self-pay | Admitting: *Deleted

## 2013-01-31 ENCOUNTER — Inpatient Hospital Stay (HOSPITAL_COMMUNITY)
Admission: AD | Admit: 2013-01-31 | Discharge: 2013-02-11 | DRG: 682 | Disposition: A | Discharge status: Home / Self Care | Payer: Medicare Other | Source: Ambulatory Visit | Attending: Internal Medicine | Admitting: Internal Medicine

## 2013-01-31 DIAGNOSIS — M109 Gout, unspecified: Secondary | ICD-10-CM | POA: Diagnosis present

## 2013-01-31 DIAGNOSIS — E8779 Other fluid overload: Secondary | ICD-10-CM

## 2013-01-31 DIAGNOSIS — I509 Heart failure, unspecified: Secondary | ICD-10-CM | POA: Diagnosis present

## 2013-01-31 DIAGNOSIS — J189 Pneumonia, unspecified organism: Secondary | ICD-10-CM

## 2013-01-31 DIAGNOSIS — E039 Hypothyroidism, unspecified: Secondary | ICD-10-CM | POA: Diagnosis present

## 2013-01-31 DIAGNOSIS — I2510 Atherosclerotic heart disease of native coronary artery without angina pectoris: Secondary | ICD-10-CM

## 2013-01-31 DIAGNOSIS — E872 Acidosis, unspecified: Secondary | ICD-10-CM

## 2013-01-31 DIAGNOSIS — Z66 Do not resuscitate: Secondary | ICD-10-CM | POA: Diagnosis present

## 2013-01-31 DIAGNOSIS — I252 Old myocardial infarction: Secondary | ICD-10-CM

## 2013-01-31 DIAGNOSIS — E8770 Fluid overload, unspecified: Secondary | ICD-10-CM | POA: Insufficient documentation

## 2013-01-31 DIAGNOSIS — G9340 Encephalopathy, unspecified: Secondary | ICD-10-CM

## 2013-01-31 DIAGNOSIS — N179 Acute kidney failure, unspecified: Principal | ICD-10-CM

## 2013-01-31 DIAGNOSIS — R042 Hemoptysis: Secondary | ICD-10-CM

## 2013-01-31 DIAGNOSIS — N184 Chronic kidney disease, stage 4 (severe): Secondary | ICD-10-CM

## 2013-01-31 DIAGNOSIS — Z950 Presence of cardiac pacemaker: Secondary | ICD-10-CM

## 2013-01-31 DIAGNOSIS — Z79899 Other long term (current) drug therapy: Secondary | ICD-10-CM

## 2013-01-31 DIAGNOSIS — R0902 Hypoxemia: Secondary | ICD-10-CM | POA: Diagnosis not present

## 2013-01-31 DIAGNOSIS — E119 Type 2 diabetes mellitus without complications: Secondary | ICD-10-CM

## 2013-01-31 DIAGNOSIS — I2589 Other forms of chronic ischemic heart disease: Secondary | ICD-10-CM

## 2013-01-31 DIAGNOSIS — I255 Ischemic cardiomyopathy: Secondary | ICD-10-CM | POA: Diagnosis present

## 2013-01-31 DIAGNOSIS — N2581 Secondary hyperparathyroidism of renal origin: Secondary | ICD-10-CM | POA: Diagnosis present

## 2013-01-31 DIAGNOSIS — E871 Hypo-osmolality and hyponatremia: Secondary | ICD-10-CM | POA: Diagnosis present

## 2013-01-31 DIAGNOSIS — E875 Hyperkalemia: Secondary | ICD-10-CM

## 2013-01-31 DIAGNOSIS — D638 Anemia in other chronic diseases classified elsewhere: Secondary | ICD-10-CM | POA: Diagnosis present

## 2013-01-31 DIAGNOSIS — N19 Unspecified kidney failure: Secondary | ICD-10-CM

## 2013-01-31 DIAGNOSIS — Z96649 Presence of unspecified artificial hip joint: Secondary | ICD-10-CM

## 2013-01-31 DIAGNOSIS — I129 Hypertensive chronic kidney disease with stage 1 through stage 4 chronic kidney disease, or unspecified chronic kidney disease: Secondary | ICD-10-CM | POA: Diagnosis present

## 2013-01-31 DIAGNOSIS — Z9861 Coronary angioplasty status: Secondary | ICD-10-CM

## 2013-01-31 DIAGNOSIS — I959 Hypotension, unspecified: Secondary | ICD-10-CM

## 2013-01-31 DIAGNOSIS — R570 Cardiogenic shock: Secondary | ICD-10-CM | POA: Diagnosis not present

## 2013-01-31 LAB — CBC
HCT: 35 % — ABNORMAL LOW (ref 36.0–46.0)
HCT: 29.6 % — ABNORMAL LOW (ref 36.0–46.0)
Hemoglobin: 9.6 g/dL — ABNORMAL LOW (ref 12.0–15.0)
MCH: 29.2 pg (ref 26.0–34.0)
MCHC: 32.6 g/dL (ref 30.0–36.0)
MCHC: 32.4 g/dL (ref 30.0–36.0)
MCV: 90.9 fL (ref 78.0–100.0)
MCV: 90 fL (ref 78.0–100.0)
Platelets: 124 10*3/uL — ABNORMAL LOW (ref 150–400)
RBC: 3.29 MIL/uL — ABNORMAL LOW (ref 3.87–5.11)
RDW: 14.7 % (ref 11.5–15.5)
RDW: 14.6 % (ref 11.5–15.5)
WBC: 6.1 10*3/uL (ref 4.0–10.5)

## 2013-01-31 LAB — BASIC METABOLIC PANEL
CO2: 16 mEq/L — ABNORMAL LOW (ref 19–32)
Calcium: 7.6 mg/dL — ABNORMAL LOW (ref 8.4–10.5)
Creatinine, Ser: 6.43 mg/dL — ABNORMAL HIGH (ref 0.50–1.10)
Glucose, Bld: 147 mg/dL — ABNORMAL HIGH (ref 70–99)

## 2013-01-31 LAB — COMPREHENSIVE METABOLIC PANEL
ALT: 16 U/L (ref 0–35)
Albumin: 2.9 g/dL — ABNORMAL LOW (ref 3.5–5.2)
Alkaline Phosphatase: 179 U/L — ABNORMAL HIGH (ref 39–117)
Glucose, Bld: 134 mg/dL — ABNORMAL HIGH (ref 70–99)
Potassium: 6.9 mEq/L (ref 3.5–5.1)
Sodium: 132 mEq/L — ABNORMAL LOW (ref 135–145)
Total Protein: 6.6 g/dL (ref 6.0–8.3)

## 2013-01-31 LAB — CK TOTAL AND CKMB (NOT AT ARMC)
CK, MB: 7 ng/mL (ref 0.3–4.0)
Relative Index: INVALID (ref 0.0–2.5)
Total CK: 82 U/L (ref 7–177)

## 2013-01-31 LAB — URINALYSIS, ROUTINE W REFLEX MICROSCOPIC
Hgb urine dipstick: NEGATIVE
Protein, ur: NEGATIVE mg/dL
Specific Gravity, Urine: 1.017 (ref 1.005–1.030)
Urobilinogen, UA: 0.2 mg/dL (ref 0.0–1.0)

## 2013-01-31 LAB — TROPONIN I: Troponin I: <0.3 ng/mL (ref <0.30)

## 2013-01-31 MED ORDER — NOREPINEPHRINE BITARTRATE 1 MG/ML IJ SOLN
2.0000 ug/min | INTRAMUSCULAR | Status: DC
  Filled 2013-01-31: qty 4

## 2013-01-31 MED ORDER — ENOXAPARIN SODIUM 30 MG/0.3ML ~~LOC~~ SOLN
30.0000 mg | SUBCUTANEOUS | Status: DC
  Filled 2013-01-31: qty 0.3

## 2013-01-31 MED ORDER — SODIUM CHLORIDE 0.9 % IV BOLUS (SEPSIS)
1000.0000 mL | Freq: Once | INTRAVENOUS | Status: AC
  Administered 2013-01-31: 1000 mL via INTRAVENOUS

## 2013-01-31 MED ORDER — SODIUM BICARBONATE 8.4 % IV SOLN
50.0000 meq | Freq: Once | INTRAVENOUS | Status: AC
  Administered 2013-01-31: 50 meq via INTRAVENOUS

## 2013-01-31 MED ORDER — SODIUM BICARBONATE 8.4 % IV SOLN
INTRAVENOUS | Status: AC
  Filled 2013-01-31: qty 50

## 2013-01-31 MED ORDER — SODIUM CHLORIDE 0.9 % IV BOLUS (SEPSIS)
250.0000 mL | Freq: Once | INTRAVENOUS | Status: AC
  Administered 2013-01-31: 250 mL via INTRAVENOUS

## 2013-01-31 MED ORDER — DEXTROSE 250 MG/ML IV SOLN
25.0000 g | Freq: Once | INTRAVENOUS | Status: DC
  Filled 2013-01-31 (×2): qty 100

## 2013-01-31 MED ORDER — LEVOTHYROXINE SODIUM 125 MCG PO TABS
125.0000 ug | ORAL_TABLET | Freq: Every day | ORAL | Status: DC
  Filled 2013-01-31: qty 1

## 2013-01-31 MED ORDER — FLUTICASONE PROPIONATE 50 MCG/ACT NA SUSP
2.0000 | Freq: Two times a day (BID) | NASAL | Status: DC
  Filled 2013-01-31: qty 16

## 2013-01-31 MED ORDER — ALPRAZOLAM 0.5 MG PO TABS: 1.0000 mg | ORAL_TABLET | Freq: Every evening | ORAL | Status: DC | PRN

## 2013-01-31 MED ORDER — SODIUM CHLORIDE 0.9 % IJ SOLN
10.0000 mL | Freq: Two times a day (BID) | INTRAMUSCULAR | Status: DC
  Administered 2013-01-31: 20 mL
  Administered 2013-02-01 – 2013-02-03 (×4): 10 mL
  Administered 2013-02-07: 3 mL
  Administered 2013-02-09: 10 mL

## 2013-01-31 MED ORDER — PANTOPRAZOLE SODIUM 40 MG PO TBEC: 40.0000 mg | DELAYED_RELEASE_TABLET | Freq: Every day | ORAL | Status: DC

## 2013-01-31 MED ORDER — SODIUM CHLORIDE 0.9 % IV BOLUS (SEPSIS)
500.0000 mL | Freq: Once | INTRAVENOUS | Status: AC
  Administered 2013-01-31: 500 mL via INTRAVENOUS

## 2013-01-31 MED ORDER — DOPAMINE-DEXTROSE 3.2-5 MG/ML-% IV SOLN
2.0000 ug/kg/min | INTRAVENOUS | Status: DC
  Administered 2013-01-31: 2 ug/kg/min via INTRAVENOUS
  Filled 2013-01-31: qty 250

## 2013-01-31 MED ORDER — DOPAMINE-DEXTROSE 3.2-5 MG/ML-% IV SOLN: 5.0000 ug/kg/min | INTRAVENOUS | Status: DC

## 2013-01-31 MED ORDER — DEXTROSE 50 % IV SOLN
1.0000 | Freq: Once | INTRAVENOUS | Status: AC
  Administered 2013-01-31: 50 mL via INTRAVENOUS
  Filled 2013-01-31: qty 50

## 2013-01-31 MED ORDER — SODIUM CHLORIDE 0.9 % IJ SOLN
10.0000 mL | INTRAMUSCULAR | Status: DC | PRN
  Administered 2013-02-04: 10 mL
  Administered 2013-02-05: 20 mL
  Administered 2013-02-05 – 2013-02-06 (×2): 10 mL
  Administered 2013-02-07: 20 mL
  Administered 2013-02-08 – 2013-02-09 (×3): 10 mL
  Administered 2013-02-10: 20 mL
  Administered 2013-02-11: 10 mL

## 2013-01-31 MED ORDER — ATORVASTATIN CALCIUM 20 MG PO TABS: 20.0000 mg | ORAL_TABLET | Freq: Every day | ORAL | Status: DC

## 2013-01-31 MED ORDER — ACETAMINOPHEN 325 MG PO TABS
650.0000 mg | ORAL_TABLET | Freq: Four times a day (QID) | ORAL | Status: DC | PRN
  Administered 2013-02-07 – 2013-02-08 (×3): 650 mg via ORAL
  Filled 2013-01-31 (×3): qty 2

## 2013-01-31 MED ORDER — INSULIN ASPART 100 UNIT/ML ~~LOC~~ SOLN
0.0000 [IU] | SUBCUTANEOUS | Status: DC
  Administered 2013-02-01: 2 [IU] via SUBCUTANEOUS
  Administered 2013-02-01: 3 [IU] via SUBCUTANEOUS
  Administered 2013-02-01 – 2013-02-02 (×3): 2 [IU] via SUBCUTANEOUS

## 2013-01-31 MED ORDER — SODIUM POLYSTYRENE SULFONATE 15 GM/60ML PO SUSP
15.0000 g | Freq: Once | ORAL | Status: AC
  Administered 2013-01-31: 15 g via ORAL
  Filled 2013-01-31: qty 60

## 2013-01-31 MED ORDER — FUROSEMIDE 10 MG/ML IJ SOLN
100.0000 mg | Freq: Once | INTRAVENOUS | Status: DC
  Filled 2013-01-31: qty 10

## 2013-01-31 MED ORDER — INSULIN ASPART 100 UNIT/ML IV SOLN
10.0000 [IU] | Freq: Once | INTRAVENOUS | Status: AC
  Administered 2013-01-31: 10 [IU] via INTRAVENOUS

## 2013-01-31 MED ORDER — INSULIN ASPART 100 UNIT/ML ~~LOC~~ SOLN: 0.0000 [IU] | Freq: Three times a day (TID) | SUBCUTANEOUS | Status: DC

## 2013-01-31 MED ORDER — PANTOPRAZOLE SODIUM 40 MG IV SOLR
40.0000 mg | Freq: Every day | INTRAVENOUS | Status: DC
  Administered 2013-01-31 – 2013-02-01 (×2): 40 mg via INTRAVENOUS
  Filled 2013-01-31 (×2): qty 40

## 2013-01-31 MED ORDER — ALLOPURINOL 100 MG PO TABS: 100.0000 mg | ORAL_TABLET | Freq: Every day | ORAL | Status: DC

## 2013-01-31 MED ORDER — ONDANSETRON HCL 4 MG/2ML IJ SOLN
4.0000 mg | Freq: Three times a day (TID) | INTRAMUSCULAR | Status: DC | PRN
  Administered 2013-02-01: 4 mg via INTRAVENOUS
  Filled 2013-01-31: qty 2

## 2013-01-31 MED ORDER — HEPARIN SODIUM (PORCINE) 5000 UNIT/ML IJ SOLN
5000.0000 [IU] | Freq: Three times a day (TID) | INTRAMUSCULAR | Status: DC
  Administered 2013-02-01 – 2013-02-08 (×22): 5000 [IU] via SUBCUTANEOUS
  Filled 2013-01-31 (×27): qty 1

## 2013-01-31 MED ORDER — SODIUM CHLORIDE 0.9 % IV SOLN
1.0000 g | INTRAVENOUS | Status: AC
  Administered 2013-01-31: 1 g via INTRAVENOUS
  Filled 2013-01-31: qty 10

## 2013-01-31 MED ORDER — ONDANSETRON HCL 4 MG/2ML IJ SOLN: 4.0000 mg | Freq: Four times a day (QID) | INTRAMUSCULAR | Status: DC | PRN

## 2013-01-31 MED ORDER — DEXTROSE 50 % IV SOLN
INTRAVENOUS | Status: AC
  Filled 2013-01-31: qty 50

## 2013-01-31 NOTE — Progress Notes (Signed)
eLink Physician-Brief Progress Note Patient Name: Miranda Cruz DOB: 15-Jul-1930 MRN: [id number]  Date of Service  01/31/2013   HPI/Events of Note  Vomited times one - zofran was d/ced   eICU Interventions  Plan: Re-order for zofran   Intervention Category Minor Interventions: Routine modifications to care plan (e.g. PRN medications for pain, fever)  DETERDING,ELIZABETH 01/31/2013, 11:38 PM

## 2013-01-31 NOTE — Progress Notes (Signed)
Peripherally Inserted Central Catheter/Midline Placement  The IV Nurse has discussed with the patient and/or persons authorized to consent for the patient, the purpose of this procedure and the potential benefits and risks involved with this procedure.  The benefits include less needle sticks, lab draws from the catheter and patient may be discharged home with the catheter.  Risks include, but not limited to, infection, bleeding, blood clot (thrombus formation), and puncture of an artery; nerve damage and irregular heat beat.  Alternatives to this procedure were also discussed. Family present  PICC/Midline Placement Documentation        Mellissa Kohut 01/31/2013, 5:43 PM

## 2013-01-31 NOTE — Consult Note (Signed)
PULMONARY  / CRITICAL CARE MEDICINE  Name: Miranda Cruz MRN: [id number] DOB: May 22, 1930    ADMISSION DATE:  01/31/2013 CONSULTATION DATE:  01/31/2013  REFERRING MD :  [facility] PRIMARY SERVICE:  PCCM  CHIEF COMPLAINT:  Hypotension  BRIEF PATIENT DESCRIPTION: 79 yo with past medical history of CAD, ischemic cardiomyopathy (EF 40%) and CKD (Cr 2.5) admitted after found her Cr to be elevated to 6.7 and K elevated to 6.5.  Initially DNR / refused dialysis, but code status overturned by family. PCCM was consulted.  SIGNIFICANT EVENTS / STUDIES:  5/27  Admitted with acute on chronic renal failure, hyperkalemia, hypotrension  LINES / TUBES: PICC 5/27 >>> Foley 5/27 >>> L rad A-line 5/27 >>>  CULTURES: 5/27 Blood >>> 5/27 Urine >>>  ANTIBIOTICS:  The patient is encephalopathic and unable to provide history, which was obtained for available medical records.  HISTORY OF PRESENT ILLNESS:  79 yo with past medical history of CAD, ischemic cardiomyopathy (EF 40%) and CKD (Cr 2.5) admitted after found her Cr to be elevated to 6.7 and K elevated to 6.5.  Initially DNR / refused dialysis, but code status overturned by family. PCCM was consulted.  PAST MEDICAL HISTORY :  History reviewed. No pertinent past medical history. History reviewed. No pertinent past surgical history. Prior to Admission medications   Medication Sig Start Date End Date Taking? Authorizing Provider  allopurinol (ZYLOPRIM) 100 MG tablet Take 100 mg by mouth daily.   Yes Historical Provider, MD  ALPRAZolam Prudy Feeler) 1 MG tablet Take 1 mg by mouth at bedtime.   Yes Historical Provider, MD  atorvastatin (LIPITOR) 40 MG tablet Take 40 mg by mouth daily.   Yes Historical Provider, MD  furosemide (LASIX) 80 MG tablet Take 80 mg by mouth daily.   Yes Historical Provider, MD  isosorbide mononitrate (IMDUR) 30 MG 24 hr tablet Take 30 mg by mouth daily.   Yes Historical Provider, MD  levothyroxine (SYNTHROID, LEVOTHROID) 125 MCG tablet Take  125 mcg by mouth daily.   Yes Historical Provider, MD  lisinopril (PRINIVIL,ZESTRIL) 5 MG tablet Take 5 mg by mouth daily.   Yes Historical Provider, MD  oxyCODONE (OXY IR/ROXICODONE) 5 MG immediate release tablet Take 10-15 mg by mouth every 4 (four) hours as needed for pain.   Yes Historical Provider, MD  pantoprazole (PROTONIX) 40 MG tablet Take 40 mg by mouth daily.   Yes Historical Provider, MD  fluticasone (FLONASE) 50 MCG/ACT nasal spray Place 2 sprays into both nostrils 2 (two) times daily.    Historical Provider, MD  glyBURIDE (DIABETA) 2.5 MG tablet Take 2.5 mg by mouth every morning.    Historical Provider, MD  methocarbamol (ROBAXIN) 500 MG tablet Take 500 mg by mouth every 6 (six) hours.    Historical Provider, MD  niacin (NIASPAN) 500 MG CR tablet Take 500 mg by mouth daily.    Historical Provider, MD  nitroGLYCERIN (NITROSTAT) 0.4 MG SL tablet Place 0.4 mg under the tongue as needed for chest pain.    Historical Provider, MD  omega-3 acid ethyl esters (LOVAZA) 1 G capsule Take 1 g by mouth daily.    Historical Provider, MD  Vitamin D, Ergocalciferol, (DRISDOL) 50000 UNITS CAPS Take 50,000 Units by mouth 3 (three) times a week.    Historical Provider, MD   Allergies  Allergen Reactions  . Morphine And Related     unknown  . Phenergan (Promethazine Hcl)     unknown  . Trazodone And Nefazodone  hallucinations    FAMILY HISTORY:  History reviewed. No pertinent family history.  SOCIAL HISTORY:  reports that she has never smoked. She does not have any smokeless tobacco history on file. Her alcohol and drug histories are not on file.  REVIEW OF SYSTEMS:  Unable to provide.  INTERVAL HISTORY:  VITAL SIGNS: Temp:  [93.3 F (34.1 C)-94.7 F (34.8 C)] 93.3 F (34.1 C) (05/27 1959) Pulse Rate:  [61-70] 70 (05/27 2130) Resp:  [10-24] 15 (05/27 2130) BP: (57-106)/(13-61) 62/38 mmHg (05/27 2130) SpO2:  [91 %-99 %] 94 % (05/27 2130) Weight:  [84.7 kg (186 lb 11.7 oz)] 84.7  kg (186 lb 11.7 oz) (05/27 1400) HEMODYNAMICS:   VENTILATOR SETTINGS:   INTAKE / OUTPUT: Intake/Output     05/27 0701 - 05/28 0700   P.O. 30   IV Piggyback 550   Total Intake(mL/kg) 580 (6.8)   Urine (mL/kg/hr) 75   Total Output 75   Net +505        PHYSICAL EXAMINATION: General:  Appears chronically ill, in no respiratory distress Neuro:  Encephalopathic but follows commands, nonfocal, cough / gag present HEENT:  PERRL Cardiovascular:  RRR, no m/r/g Lungs:  Bilateral diminished air entry, few bibasilar rales Abdomen:  Soft, nontender, bowel sounds diminished Musculoskeletal:  Moves all extremities, anasarca Skin:  Intact  LABS:  Recent Labs Lab 01/31/13 1503 01/31/13 2048  HGB 11.4*  --   WBC 8.0  --   PLT 153  --   NA 132* 133*  K 6.9* 5.2*  CL 97 99  CO2 15* 16*  GLUCOSE 134* 147*  BUN 139* 142*  CREATININE 6.63* 6.43*  CALCIUM 8.0* 7.6*  AST 40*  --   ALT 16  --   ALKPHOS 179*  --   BILITOT 0.4  --   PROT 6.6  --   ALBUMIN 2.9*  --   TROPONINI  --  <0.30   No results found for this basename: GLUCAP,  in the last 168 hours  CXR:  5/27 >>>  Hardware in place, pulmonary vascular congestion, no overt airspace disease  ASSESSMENT / PLAN:  PULMONARY A:  Mild pulmonary edema.  No overt failure but at risk for intubation. P:   Gaol SpO2>92 Supplemental oxygen PRN  CARDIOVASCULAR A: Hypotension.  Ischemic cardiomyopathy.  Possible SIRS. P:  Goal MAP>60 Trend troponin / lactate TTE Dopamine PRN Hold Lipitor, Imdur, Niacin, Lovaza, Lipitor  RENAL A:  Acute on chronic renal failure.  Hyperkalemia, improving.  Hypervolemic hyponatremia.  Metabolic acidosis. P:   Renal following, likely need HD / CVVH (agreable to short term per family) Treated with Ca, bicarbonate, Insulin, D50, kayexalate  Given NS 1000 x 1 CVP via power PICC Hold Lasix Trend BMP Renal US  GASTROINTESTINAL A:  No active issues. P:   NPO as encephalopathic Protonix IV as  preadmission  HEMATOLOGIC A:  Mild anemia. P:  Trend CBC Heparin for DVT Px  INFECTIOUS A:  No evidence of infection. P:   Cultures  PCT Defer antibiotics for now  ENDOCRINE  A:  DM.  Hyperglycemia.  Hypothyroidism.  Unknown thyroid function.  P:   Hold Glyburide SSI Levothyroxine when able Cortisol level  NEUROLOGIC A:  Acute encephalopathy (uremia, medications). P:   Hold Xanax, Oxycodone  TODAY'S SUMMARY: 79 yo with past medical history of CAD, ischemic cardiomyopathy (EF 40%) and CKD (Cr 2.5) admitted after found her Cr to be elevated to 6.7 and K elevated to 6.5.  Initially DNR /  refused dialysis, but code status overturned by family. No indications for urgent HD.  No boluses - CVP 20.  Dopamine for goal MAP > 60.  Monitor oxygen requirements / need for BiPAP / intubation.  I have personally obtained a history, examined the patient, evaluated laboratory and imaging results, formulated the assessment and plan and placed orders.  CRITICAL CARE:  The patient is critically ill with multiple organ systems failure and requires high complexity decision making for assessment and support, frequent evaluation and titration of therapies, application of advanced monitoring technologies and extensive interpretation of multiple databases. Critical Care Time devoted to patient care services described in this note is 45 minutes.   Lonia Farber, MD Pulmonary and Critical Care Medicine [facility] Pager: [phone]  01/31/2013, 10:15 PM

## 2013-01-31 NOTE — Consult Note (Addendum)
[facility]        RENAL CONSULT   Reason for Consult: Worsening renal function in patient with underlying CKD. CR was 2.1 on 01/16/2013. Today BUN greater than 125, CR 6.8 and potassium 6.5.  Mrs. Salaz weighed 175 pounds on 01/16/2013 and Dr. Vicente Males office. She weighed 182 pounds today Referring Physician: Dr. Zannie Cove  HPI: ADRIANN THAU is a 79 y.o. white woman who was brought to Dr. Vicente Males office by her daughter today because of altered mental status. She usually lives at home with daughter Miriana Gaertner.  She has recently gotten back from a trip to [facility]. She says that she may not have been taking her medicines regularly. In addition she was taking ibuprofen for the last several days because of back pain ( no more than 2 or 3 per day according to her). In addition, she fell last week bruising her right anterior rib area, right hip, and 2nd and 3rd toes of the left foot. .    Past medical history: DM-2 High BP Hypothyroidism Coronary artery disease (sees Dr. Eden Lathe 1995, stent 2000, pacemaker 2010 Gout Left THR 2009, right THR 2010 (Dr. Lajoyce Corners) Ventral hernia repair with complications (infection) 2008, 2010 Hard of hearing   Family History:  Her father died in his 47s of diabetes. He had "kidney trouble" but was never on dialysis. Her mother died age 73 of pneumonia she had 2 brothers and one sister one brother is living one died of unknown causes, one sister died of a stroke. She has 4 daughters and one son. The oldest daughter has multiple sclerosis  Social History: Born and grew up in [place], [place], graduated from [employer], Lives with daughter Bushra Denman, she was married 41yr, her husband died in 30. 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 Morphine And Related     unknown  . Phenergan (Promethazine Hcl)     unknown  .  Trazodone And Nefazodone     hallucinations     Medications:  I have reviewed the patient's current medications. Prior to Admission:  Prescriptions prior to admission  Medication Sig Dispense Refill  . allopurinol (ZYLOPRIM) 100 MG tablet Take 100 mg by mouth daily.      Marland Kitchen ALPRAZolam (XANAX) 1 MG tablet Take 1 mg by mouth at bedtime.      Marland Kitchen atorvastatin (LIPITOR) 20 MG tablet Take 20 mg by mouth daily.      . fluticasone (FLONASE) 50 MCG/ACT nasal spray Place 2 sprays into both nostrils 2 (two) times daily.      . furosemide (LASIX) 80 MG tablet Take 80 mg by mouth daily.      Marland Kitchen glyBURIDE (DIABETA) 2.5 MG tablet Take 2.5 mg by mouth every morning.      . isosorbide mononitrate (IMDUR) 30 MG 24 hr tablet Take 30 mg by mouth daily.      Marland Kitchen levothyroxine (SYNTHROID, LEVOTHROID) 125 MCG tablet Take 125 mcg by mouth daily.      Marland Kitchen lisinopril (PRINIVIL,ZESTRIL) 5 MG tablet Take 5 mg by mouth daily.      . methocarbamol (ROBAXIN) 500 MG tablet Take 500 mg by mouth every 6 (six) hours.      . niacin (NIASPAN) 500 MG CR tablet Take 500 mg by mouth daily.      . nitroGLYCERIN (NITROSTAT) 0.4 MG SL tablet  Place 0.4 mg under the tongue as needed for chest pain.      Marland Kitchen omega-3 acid ethyl esters (LOVAZA) 1 G capsule Take 1 g by mouth daily.      Marland Kitchen oxyCODONE (OXY IR/ROXICODONE) 5 MG immediate release tablet Take 10-15 mg by mouth every 4 (four) hours as needed for pain.      . pantoprazole (PROTONIX) 40 MG tablet Take 40 mg by mouth daily.      . Vitamin D, Ergocalciferol, (DRISDOL) 50000 UNITS CAPS Take 50,000 Units by mouth 3 (three) times a week.       Scheduled:  Allopurinol 100/D., H. atorvastatin 20/D., calcium gluconate one G. IV, Lovenox 30 mg subcutaneous/D., Flonase 2 sprays twice a day, levothyroxine 0.125/D., Protonix 40/D.    ROS- no angina, no claudication, no melena, no hematochezia, no gross hematuria, no renal colic,   no nocturia, no doe, no orthopnea, no purulent sputum, no  hemoptysis, no weight gain or weight loss, no cold or heat intolerance.  Physical Exam Blood pressure 106/23, pulse 70, resp. rate 13, SpO2 93.00%. 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 Acute worsening of renal function in patient with underlying chronic kidney disease-- Will check UA, renal ultrasound (to be sure there is no lower tract obstruction), renal artery duplex scan (to be sure no bilateral renal artery occlusion), SPEP and UPEP (to be sure no myeloma) , CK (has bruising and a recent fall). Hold lisinopril Treat hyperkalemia with calcium, glucose and insulin, bicarb, Kayexalate. Check EKG. Furosemide will help fluid overload as well as hyperkalemia I spoke with Mrs. Viruet's daughter Alton Revere), and a daughter-in-law Ishmael Holter, RN). They agree with Mrs. Aeschliman that she's not interested in long-term dialysis but if short-term dialysis is needed to return her to her prior state of health, that she would be willing to undergo dialysis 2. DM-2-- Per primary service 3. High BP--hold lisinopril, blood pressure not high currently 4. Altered mental status--per primary service 5. Gout-- Continue on allopurinol unless she has eosinophilia or pyuria 6. Ischemic cardiomyopathy--check 2-D echo (last echo from Dr. Arminda Resides office January 2010 EF 40-45%), furosemide when  necessary 7. Hypothyroidism--continue with thyroid replacement therapy 8. Bruising L 2nd and 3rd toes--X Ray   Sherissa Tenenbaum F 01/31/2013, 3:53 PM

## 2013-01-31 NOTE — Progress Notes (Signed)
*  PRELIMINARY RESULTS* Vascular Ultrasound Renal Artery Duplex has been completed.  Study was technically limited due to patient body habitus and lack of patient cooperation. There is no obvious evidence of hemodynamically significant renal artery stenosis involving the visualized segments of the intrarenal and renal arteries bilaterally.  01/31/2013 4:51 PM Gertie Fey, RDMS, RDCS

## 2013-01-31 NOTE — Procedures (Signed)
Arterial Catheter Insertion Procedure Note Miranda Cruz [id number] 1929/10/18  Procedure: Insertion of Arterial Catheter  Indications: Blood pressure monitoring and Frequent blood sampling  Procedure Details Consent: Risks of procedure as well as the alternatives and risks of each were explained to the (patient/caregiver).  Consent for procedure obtained. Time Out: Verified patient identification, verified procedure, site/side was marked, verified correct patient position, special equipment/implants available, medications/allergies/relevent history reviewed, required imaging and test results available.  Performed  Maximum sterile technique was used including antiseptics, cap, gloves, gown, hand hygiene, mask and sheet. Skin prep: Chlorhexidine; local anesthetic administered 20 gauge catheter was inserted into left radial artery using the Seldinger technique.  Evaluation Blood flow good; BP tracing good. Complications: No apparent complications.   [name] 01/31/2013

## 2013-01-31 NOTE — Progress Notes (Signed)
Spoke with Natalia Leatherwood with [facility] and she came to unit to assess pt, and speak with pts family about pts condition and code status. Pt is being transferred to ICU and CCM.

## 2013-01-31 NOTE — Progress Notes (Signed)
Utilization Review Completed.Nishi Neiswonger T5/27/2014  

## 2013-01-31 NOTE — Progress Notes (Signed)
DR. Jomarie Longs paged about BP remaining low. Pt now has PICC in place. Will give 250cc NS bolus when PICC confirmed.

## 2013-01-31 NOTE — Progress Notes (Signed)
Spoke with Natalia Leatherwood with [facility] about pts BP/ and lethargy. New orders received.

## 2013-01-31 NOTE — H&P (Addendum)
[facility] History and Physical  Miranda Cruz ZOX:[id number] DOB: Jul 22, 1930 DOA: 01/31/2013  Referring physician: Louanna Raw, Direct Admission to SDU PCP: Hoyle Sauer, MD  Specialists: cards Dr.Skains  Chief Complaint: kidney problems from PCP office  HPI: Miranda Cruz is a 79 y.o. female with H/o Ischemic cardiomyopathy EF of 40%, CAD, CKD, baseline creatinine 2-2.5, Gout, DM  was brought to Dr. Vicente Males office by her daughter today because of lethargy, swelling. She usually lives at home with daughter and she just got back from a family trip [place].  Per family they think she has missed her medicines/lasix for 4-5 days, they deny any NSAID use. Also reports poor PO intake, worsening lower ext edema, breathing heavier for 4days. In addition, she fell last week bruising her right anterior chest, Left upper back etc. No fevers, chills, no vomiting, diarrhea. Upon evaluation in Dr.Avvas office she was noted to be volume overloaded and Labs revealed creatinine of 6.7, K of 6.5, Co2 of 13 and she was referred for Direct Admission.  Review of Systems: The patient denies anorexia, fever, weight loss,, vision loss, decreased hearing, hoarseness, chest pain, syncope,  balance deficits, hemoptysis, abdominal pain, melena, hematochezia, severe indigestion/heartburn, hematuria, incontinence, genital sores, muscle weakness, suspicious skin lesions, transient blindness, difficulty walking, depression,  abnormal bleeding, enlarged lymph nodes, angioedema, and breast masses.    History reviewed. No pertinent past medical history. History reviewed. No pertinent past surgical history. Social History:  reports that she has never smoked. She does not have any smokeless tobacco history on file. Her alcohol and drug histories are not on file. Lives at home, independent in ADLs  Allergies  Allergen Reactions  . Morphine And Related     unknown  . Phenergan (Promethazine Hcl)     unknown  . Trazodone  And Nefazodone     hallucinations      family history. Unable to recall at this time  Prior to Admission medications   Medication Sig Start Date End Date Taking? Authorizing Provider  allopurinol (ZYLOPRIM) 100 MG tablet Take 100 mg by mouth daily.   Yes Historical Provider, MD  ALPRAZolam Prudy Feeler) 1 MG tablet Take 1 mg by mouth at bedtime.   Yes Historical Provider, MD  atorvastatin (LIPITOR) 40 MG tablet Take 40 mg by mouth daily.   Yes Historical Provider, MD  furosemide (LASIX) 80 MG tablet Take 80 mg by mouth daily.   Yes Historical Provider, MD  isosorbide mononitrate (IMDUR) 30 MG 24 hr tablet Take 30 mg by mouth daily.   Yes Historical Provider, MD  levothyroxine (SYNTHROID, LEVOTHROID) 125 MCG tablet Take 125 mcg by mouth daily.   Yes Historical Provider, MD  lisinopril (PRINIVIL,ZESTRIL) 5 MG tablet Take 5 mg by mouth daily.   Yes Historical Provider, MD  oxyCODONE (OXY IR/ROXICODONE) 5 MG immediate release tablet Take 10-15 mg by mouth every 4 (four) hours as needed for pain.   Yes Historical Provider, MD  pantoprazole (PROTONIX) 40 MG tablet Take 40 mg by mouth daily.   Yes Historical Provider, MD  fluticasone (FLONASE) 50 MCG/ACT nasal spray Place 2 sprays into both nostrils 2 (two) times daily.    Historical Provider, MD  glyBURIDE (DIABETA) 2.5 MG tablet Take 2.5 mg by mouth every morning.    Historical Provider, MD  methocarbamol (ROBAXIN) 500 MG tablet Take 500 mg by mouth every 6 (six) hours.    Historical Provider, MD  niacin (NIASPAN) 500 MG CR tablet Take 500 mg by mouth daily.  Historical Provider, MD  nitroGLYCERIN (NITROSTAT) 0.4 MG SL tablet Place 0.4 mg under the tongue as needed for chest pain.    Historical Provider, MD  omega-3 acid ethyl esters (LOVAZA) 1 G capsule Take 1 g by mouth daily.    Historical Provider, MD  Vitamin D, Ergocalciferol, (DRISDOL) 50000 UNITS CAPS Take 50,000 Units by mouth 3 (three) times a week.    Historical Provider, MD   Physical  Exam: Filed Vitals:   01/31/13 1400 01/31/13 1500 01/31/13 1600  BP: 106/23  100/52  Pulse: 61 70 70  TempSrc:   Axillary  Resp: 15 13 14   Height: 5\' 5"  (1.651 m)    Weight: 84.7 kg (186 lb 11.7 oz)    SpO2: 98% 93% 97%    Blood pressure 106/23, pulse 70, resp. rate 13, SpO2 93.00%.  General-somewhat lethargic, easily  arousible and able to talk intelligibly when awake, oriented to self, place and partly to time  HEENT: PERRLA, EOMI, several missing front teeth, no JVD Chest-pacemaker left subclavian, clear to auscultation, no consolidation  Heart-no rub  Abd-midline scar, ecchymoses right costal margin, no organs or masses are felt, nontender, bowel sounds present  Extr- 2-3plus edema pitting,  brusing of 2 toes of L foot Neuro-moves all ext, no localizing signs Skin: bruising of toes, anterior chest wall, and Left upper back    Labs on Admission:  Basic Metabolic Panel:  Recent Labs Lab 01/31/13 1503  NA 132*  K 6.9*  CL 97  CO2 15*  GLUCOSE 134*  BUN 139*  CREATININE 6.63*  CALCIUM 8.0*   Liver Function Tests:  Recent Labs Lab 01/31/13 1503  AST 40*  ALT 16  ALKPHOS 179*  BILITOT 0.4  PROT 6.6  ALBUMIN 2.9*   No results found for this basename: LIPASE, AMYLASE,  in the last 168 hours No results found for this basename: AMMONIA,  in the last 168 hours CBC:  Recent Labs Lab 01/31/13 1503  WBC 8.0  HGB 11.4*  HCT 35.0*  MCV 90.9  PLT 153   Cardiac Enzymes:  Recent Labs Lab 01/31/13 1503  CKTOTAL 120    BNP (last 3 results) No results found for this basename: PROBNP,  in the last 8760 hours CBG: No results found for this basename: GLUCAP,  in the last 168 hours  Radiological Exams on Admission: Dg Chest Port 1 View  01/31/2013   *RADIOLOGY REPORT*  Clinical Data: Volume overload, renal failure  PORTABLE CHEST - 1 VIEW  Comparison: 10/11/2008  Findings: The cardiac shadow is enlarged.  A pacing device is again identified.  Mild vascular  congestion with mild interstitial edema is seen.  No sizable effusion or infiltrate is noted.  IMPRESSION: Changes consistent with mild congestive failure.   Original Report Authenticated By: Alcide Clever, M.D.    EKG: Independently reviewed. pending  Assessment/Plan Active Problems:   1. ARF on CKD with volume overload - admit to SDU - stop ACE -Place foley STAT -Strict I/Os -repeat labs, CBC, Bmet, CK, BNP, cardiac enzymes, SPEP/UPEP -UA -Renal USG and Renal arterial duplex per Dr.Fox recs -Renal Consult -She is inclined against DIalysis -DIuretics per Renal    2. Hyperkalemia:  -EKG  -CA gluconate stat and Kayexelate  - Insulin and Dextrose - repeat labs tonight  3.  Metabolic acidosis -due to 1 -? Bicarb per Renal  4. DM (diabetes mellitus) -SSI  5. Cardiomyopathy, ischemic/CAD -check ECHO -continue statin, hold imdur  DVt proph: lovenox  Code Status: DNR  Family Communication: d/w daughter and Daughter in law at bedside Disposition Plan: SDU  Time spent:  Jarrell Armond [facility] Pager [phone]  If 7PM-7AM, please contact night-coverage [email or web address] Password [facility] 01/31/2013, 5:00 PM

## 2013-02-01 ENCOUNTER — Inpatient Hospital Stay (HOSPITAL_COMMUNITY): Payer: Medicare Other

## 2013-02-01 LAB — CBC
HCT: 36.2 % (ref 36.0–46.0)
Hemoglobin: 11.6 g/dL — ABNORMAL LOW (ref 12.0–15.0)
MCH: 29.1 pg (ref 26.0–34.0)
MCHC: 32 g/dL (ref 30.0–36.0)
MCV: 91 fL (ref 78.0–100.0)
Platelets: 191 10*3/uL (ref 150–400)
RBC: 3.98 MIL/uL (ref 3.87–5.11)
RDW: 14.7 % (ref 11.5–15.5)
WBC: 12.5 10*3/uL — ABNORMAL HIGH (ref 4.0–10.5)

## 2013-02-01 LAB — BASIC METABOLIC PANEL
BUN: 140 mg/dL — ABNORMAL HIGH (ref 6–23)
BUN: 130 mg/dL — ABNORMAL HIGH (ref 6–23)
BUN: 127 mg/dL — ABNORMAL HIGH (ref 6–23)
CO2: 16 mEq/L — ABNORMAL LOW (ref 19–32)
CO2: 17 mEq/L — ABNORMAL LOW (ref 19–32)
Calcium: 7.6 mg/dL — ABNORMAL LOW (ref 8.4–10.5)
Chloride: 98 mEq/L (ref 96–112)
Chloride: 102 mEq/L (ref 96–112)
Chloride: 104 mEq/L (ref 96–112)
Creatinine, Ser: 6.01 mg/dL — ABNORMAL HIGH (ref 0.50–1.10)
Creatinine, Ser: 5.57 mg/dL — ABNORMAL HIGH (ref 0.50–1.10)
GFR calc Af Amer: 7 mL/min — ABNORMAL LOW (ref >90)
GFR calc non Af Amer: 6 mL/min — ABNORMAL LOW (ref >90)
Glucose, Bld: 193 mg/dL — ABNORMAL HIGH (ref 70–99)
Glucose, Bld: 125 mg/dL — ABNORMAL HIGH (ref 70–99)
Potassium: 6.2 mEq/L — ABNORMAL HIGH (ref 3.5–5.1)
Potassium: 5.5 mEq/L — ABNORMAL HIGH (ref 3.5–5.1)
Sodium: 133 mEq/L — ABNORMAL LOW (ref 135–145)

## 2013-02-01 LAB — URINE CULTURE
Colony Count: NO GROWTH
Culture: NO GROWTH

## 2013-02-01 LAB — GLUCOSE, CAPILLARY: Glucose-Capillary: 144 mg/dL — ABNORMAL HIGH (ref 70–99)

## 2013-02-01 LAB — LACTIC ACID, PLASMA: Lactic Acid, Venous: 0.7 mmol/L (ref 0.5–2.2)

## 2013-02-01 LAB — IRON AND TIBC: TIBC: 190 ug/dL — ABNORMAL LOW (ref 250–470)

## 2013-02-01 MED ORDER — SODIUM CHLORIDE 0.9 % IV SOLN
INTRAVENOUS | Status: DC
  Administered 2013-01-31: 10 mL/h via INTRAVENOUS
  Administered 2013-02-01: 1000 mL via INTRAVENOUS

## 2013-02-01 MED ORDER — LEVOTHYROXINE SODIUM 100 MCG IV SOLR
75.0000 ug | Freq: Every day | INTRAVENOUS | Status: DC
  Administered 2013-02-01 – 2013-02-03 (×3): 75 ug via INTRAVENOUS
  Filled 2013-02-01 (×3): qty 5

## 2013-02-01 MED ORDER — NOREPINEPHRINE BITARTRATE 1 MG/ML IJ SOLN
2.0000 ug/min | INTRAVENOUS | Status: DC
  Administered 2013-02-01: 4 ug/min via INTRAVENOUS
  Administered 2013-02-02: 20 ug/min via INTRAVENOUS
  Administered 2013-02-02: 24 ug/min via INTRAVENOUS
  Filled 2013-02-01 (×3): qty 16

## 2013-02-01 MED ORDER — FUROSEMIDE 10 MG/ML IJ SOLN
100.0000 mg | Freq: Four times a day (QID) | INTRAMUSCULAR | Status: AC
  Administered 2013-02-01 – 2013-02-02 (×4): 100 mg via INTRAVENOUS
  Filled 2013-02-01 (×4): qty 10

## 2013-02-01 MED ORDER — SODIUM POLYSTYRENE SULFONATE 15 GM/60ML PO SUSP
60.0000 g | Freq: Once | ORAL | Status: AC
  Administered 2013-02-01: 60 g via ORAL
  Filled 2013-02-01: qty 240

## 2013-02-01 MED ORDER — SODIUM CHLORIDE 0.9 % IV SOLN
INTRAVENOUS | Status: DC
  Administered 2013-01-31: 10 mL/h via INTRAVENOUS

## 2013-02-01 MED ORDER — SODIUM POLYSTYRENE SULFONATE 15 GM/60ML PO SUSP
60.0000 g | Freq: Once | ORAL | Status: AC
  Administered 2013-02-01: 60 g via RECTAL
  Filled 2013-02-01: qty 240

## 2013-02-01 MED ORDER — SODIUM CHLORIDE 0.9 % IV BOLUS (SEPSIS)
750.0000 mL | Freq: Once | INTRAVENOUS | Status: AC
  Administered 2013-02-01: 750 mL via INTRAVENOUS

## 2013-02-01 NOTE — Consult Note (Signed)
PULMONARY  / CRITICAL CARE MEDICINE  Name: Miranda Cruz MRN: [id number] DOB: 09-14-1929    ADMISSION DATE:  01/31/2013 CONSULTATION DATE:  01/31/2013  REFERRING MD :  [facility] PRIMARY SERVICE:  PCCM  CHIEF COMPLAINT:  Hypotension  BRIEF PATIENT DESCRIPTION: 79 yo with past medical history of CAD, ischemic cardiomyopathy (EF 40%) and CKD (Cr 2.5) admitted after found her Cr to be elevated to 6.7 and K elevated to 6.5.  Initially DNR / refused dialysis, but code status overturned by family. PCCM was consulted.  SIGNIFICANT EVENTS / STUDIES:  5/27  Admitted with acute on chronic renal failure, hyperkalemia, hypotension 5/28 Renal US: No evidence of hydronephrosis; no acute abnormality seen to explain the patient's renal failure  LINES / TUBES: RUE PICC 5/27 >>  L rad A-line 5/27 >>   CULTURES: 5/27 Blood >>  5/27 Urine >>   ANTIBIOTICS:   SUBJ: Somnolent. Poorly oriented. No distress   VITAL SIGNS: Temp:  [93.3 F (34.1 C)-98.5 F (36.9 C)] 98.1 F (36.7 C) (05/28 1200) Pulse Rate:  [68-75] 70 (05/28 1400) Resp:  [10-24] 12 (05/28 1400) BP: (57-106)/(13-60) 67/45 mmHg (05/27 2145) SpO2:  [86 %-100 %] 100 % (05/28 1400) Arterial Line BP: (79-141)/(35-71) 79/38 mmHg (05/28 1400) Weight:  [87.9 kg (193 lb 12.6 oz)] 87.9 kg (193 lb 12.6 oz) (05/28 0500) HEMODYNAMICS: CVP:  [18 mmHg-24 mmHg] 18 mmHg VENTILATOR SETTINGS:   INTAKE / OUTPUT: Intake/Output     05/27 0701 - 05/28 0700 05/28 0701 - 05/29 0700   P.O. 270    I.V. (mL/kg) 1381.3 (15.7) 274.5 (3.1)   IV Piggyback 550 800   Total Intake(mL/kg) 2201.3 (25) 1074.5 (12.2)   Urine (mL/kg/hr) 155 380 (0.6)   Emesis/NG output 240    Total Output 395 380   Net +1806.3 +694.5        Stool Occurrence  1 x   Emesis Occurrence 1 x     PHYSICAL EXAMINATION: General: NAD. Poorly oriented Neuro: Cognition and LOC impaired. No focal deficits HEENT: WNL. PERRL Cardiovascular:  RRR s M Lungs: no adventitious sounds  anteriorly Abdomen:  Soft, nontender, bowel sounds diminished Ext: symmetric pitting edema   LABS: BMET    Component Value Date/Time   NA 137 02/01/2013 1300   K 5.5* 02/01/2013 1300   CL 102 02/01/2013 1300   CO2 13* 02/01/2013 1300   GLUCOSE 125* 02/01/2013 1300   BUN 130* 02/01/2013 1300   CREATININE 6.08* 02/01/2013 1300   CALCIUM 7.8* 02/01/2013 1300   GFRNONAA 6* 02/01/2013 1300   GFRAA 7* 02/01/2013 1300    CBC    Component Value Date/Time   WBC 12.5* 02/01/2013 0411   RBC 3.98 02/01/2013 0411   HGB 11.6* 02/01/2013 0411   HCT 36.2 02/01/2013 0411   PLT 191 02/01/2013 0411   MCV 91.0 02/01/2013 0411   MCH 29.1 02/01/2013 0411   MCHC 32.0 02/01/2013 0411   RDW 14.7 02/01/2013 0411   LYMPHSABS 2.3 01/21/2009 1340   MONOABS 0.5 01/21/2009 1340   EOSABS 0.4 01/21/2009 1340   BASOSABS 0.0 01/21/2009 1340      Recent Labs Lab 01/31/13 2353 02/01/13 0402  GLUCAP 125* 167*    CXR:  No new film  ASSESSMENT / PLAN:  RENAL A:  Acute on chronic renal failure.   Hyperkalemia, improving.  Hypervolemic hyponatremia.   Metabolic acidosis. P:   Renal following Monitor BMET Repeat NS bolus based on clear CXR Lasix per Renal  Avoid nephrotoxins  NEUROLOGIC A:  Acute encephalopathy - likely uremic. P:   Holding Xanax, Oxycodone   CARDIOVASCULAR A: Hypotension.  Ischemic cardiomyopathy.  doubt SIRS. P:  Cont vasopressors to maintain MAP > 70 mmHg (to optimize renal perfusion pressure)  PULMONARY A:  No acute issues P:   Gaol SpO2>92 Supplemental oxygen PRN   GASTROINTESTINAL A:  No active issues. P:   NPO as encephalopathic Protonix IV as preadmission  HEMATOLOGIC A:  Mild anemia. P:  Trend CBC Heparin for DVT Px  INFECTIOUS A:  No evidence of infection. P:   F/U cx results Trend PCT Monitor off abx   Daughter updated @ bedside   TODAY'S SUMMARY:    I have personally obtained a history, examined the patient, evaluated laboratory and imaging  results, formulated the assessment and plan and placed orders.  CRITICAL CARE:    Billy Fischer, MD Pulmonary and Critical Care Medicine [facility] Pager: [phone]  02/01/2013, 2:39 PM

## 2013-02-01 NOTE — Progress Notes (Signed)
eLink Physician-Brief Progress Note Patient Name: Miranda Cruz DOB: 1930-07-25 MRN: [id number]  Date of Service  02/01/2013   HPI/Events of Note  Hyperkalemia  eICU Interventions  Plan: Kayexalate 60 gm po times one dose   Intervention Category Major Interventions: Electrolyte abnormality - evaluation and management  DETERDING,ELIZABETH 02/01/2013, 5:04 AM

## 2013-02-01 NOTE — Consult Note (Signed)
WOC consult Note Reason for Consult: Requested to assess pt for skin breakdown in folds.  Groin and abd skin folds with red fissures and partial thickness skin loss consistent with intertrigo. Mod thick yellow drainage, some odor. Interdry silver-impregnated fabric ordered to provide antimicrobial benefits and wick moisture away from skin. This should remain in place for 5 days to promote optimal healing. Instructions left for staff use.  Please re-consult if further assistance is needed.  Thank-you,  Jariya Reichow MSN, RN, CWOCN, CWCN-AP, [phone]   

## 2013-02-01 NOTE — Progress Notes (Signed)
Vomited large amount  after kayexalate,  Called and notified ccm

## 2013-02-01 NOTE — Progress Notes (Signed)
  Echocardiogram 2D Echocardiogram has been performed.  Georgian Co 02/01/2013, 12:22 PM

## 2013-02-01 NOTE — Progress Notes (Signed)
[facility]  Subjective:  Less responsive than yesterday, though she still recognizes daughters. She was placed on dopamine last night for low BP--she never received any IV furosemide yesterday (100 mg IV x 2 were ordered) Not on antibiotics   Objective: Vital signs in last 24 hours:  BP currently 120/52 Blood pressure 67/45, pulse 70, temperature 98.5 F (36.9 C), temperature source Axillary, resp. rate 15, height 5\' 5"  (1.651 m), weight 87.9 kg (193 lb 12.6 oz), SpO2 99.00%.    PHYSICAL EXAM General--as above Chest--rhonchi Heart--no rub Abd--nontender Extr--2+ edema persists  Lab Results:   Recent Labs Lab 01/31/13 1503 01/31/13 2048 02/01/13 0411  NA 132* 133* 133*  K 6.9* 5.2* 6.2*  CL 97 99 98  CO2 15* 16* 16*  BUN 139* 142* 140*  CREATININE 6.63* 6.43* 6.01*  GLUCOSE 134* 147* 193*  CALCIUM 8.0* 7.6* 7.6*   CK (total) 82  Recent Labs  01/31/13 2200 02/01/13 0411  WBC 6.1 12.5*  HGB 9.6* 11.6*  HCT 29.6* 36.2  PLT 124* 191   UA--pH 5, SG 1.017, dipstick neg; SPEP and UPEP pending  Renal US--R 10.4 cm, L 8.4 cm, no hydro; renal artery duplex results not back yet  I have reviewed the patient's current medications.   Assessment/Plan: 1. Acute worsening of renal function in patient with underlying chronic kidney disease--very little urine output overnight (no furosemide given).  Will rx furosemide 100 mg IV q 6 hr.  60 g kayexalate given earlier today.  CVP 18.  If oliguria persists, then dialysis may be necessary to correct acidosis and high K.   2. DM-2-- Per primary service 3. High BP-- lisinopril held, blood pressure better on dopamine 4. Altered mental status--per primary service 5. Gout-- Continue on allopurinol unless she has eosinophilia or pyuria 6. Ischemic cardiomyopathy--check 2-D echo (last echo from Dr. Arminda Resides office January 2010 EF 40-45%), furosemide when necessary 7. Hypothyroidism--continue with thyroid replacement  therapy 8. Bruising L 2nd and 3rd toes--X Ray shows no fracture        LOS: 1 day   Jamus Loving F 02/01/2013,8:35 AM   .labalb

## 2013-02-01 NOTE — Progress Notes (Signed)
INITIAL NUTRITION ASSESSMENT  DOCUMENTATION CODES Per approved criteria  -Obesity Unspecified   INTERVENTION: 1. Resource Breeze po BID, each supplement provides 250 kcal and 9 grams of protein. Once diet advanced  NUTRITION DIAGNOSIS: Inadequate oral intake related to "no appetite" as evidenced by weight loss.   Goal: Diet advance to meet >/=75% estimated nutrition needs  Monitor:  Diet advance, weight trends, labs, HD?   Reason for Assessment: Malnutrition Screening Tool  79 y.o. female  Admitting Dx: AMS  ASSESSMENT: Pt with recent trip and likely missed several days of medications. Admitted with AMS, worsening of renal function with baseline CKD, and worsening edema. Per notes pt was not eating well PTA.  Pt was previously a DNR and has refused HD, but family desires short term HD if needed.   Spoke with pt's daughter. Pt does not eat unless prompted most of the time, will drink Ensure clear or eat a protein bar sometimes. States she never feels hungry. Per daughter, lost 12 lbs in the past 6 months, and about 8 lbs in the 6 months prior to that. Has been trending down for a while. Weight loss is not significant in given time frame. Fluid status may be masking additional weight loss.   Height: Ht Readings from Last 1 Encounters:  01/31/13 5\' 5"  (1.651 m)    Weight: Wt Readings from Last 1 Encounters:  02/01/13 193 lb 12.6 oz (87.9 kg)    Ideal Body Weight: 125 lbs   % Ideal Body Weight: 154%  Wt Readings from Last 10 Encounters:  02/01/13 193 lb 12.6 oz (87.9 kg)  Admission weight 186 lbs   Usual Body Weight: 206 lbs ?  % Usual Body Weight: 90%  BMI:  Body mass index is 32.25 kg/(m^2). Obesity class 1  Estimated Nutritional Needs: Kcal: 1900-2100 Protein: 55-65 gm  Fluid: >/= 1.2 L   Skin: stage I on sacrum and groin. +4 pitting edema in BLE   Diet Order:   NPO  EDUCATION NEEDS: -No education needs identified at this time   Intake/Output Summary  (Last 24 hours) at 02/01/13 1117 Last data filed at 02/01/13 1034  Gross per 24 hour  Intake 2353.81 ml  Output    520 ml  Net 1833.81 ml    Last BM: PTA    Labs:   Recent Labs Lab 01/31/13 1503 01/31/13 2048 02/01/13 0411  NA 132* 133* 133*  K 6.9* 5.2* 6.2*  CL 97 99 98  CO2 15* 16* 16*  BUN 139* 142* 140*  CREATININE 6.63* 6.43* 6.01*  CALCIUM 8.0* 7.6* 7.6*  GLUCOSE 134* 147* 193*    CBG (last 3)   Recent Labs  01/31/13 2353 02/01/13 0402  GLUCAP 125* 167*    Scheduled Meds: . furosemide  100 mg Intravenous Q6H  . heparin subcutaneous  5,000 Units Subcutaneous Q8H  . insulin aspart  0-15 Units Subcutaneous Q4H  . levothyroxine  75 mcg Intravenous Daily  . pantoprazole (PROTONIX) IV  40 mg Intravenous QHS  . sodium chloride  10-40 mL Intracatheter Q12H    Continuous Infusions: . sodium chloride 10 mL/hr at 02/01/13 0600  . sodium chloride 10 mL/hr at 02/01/13 0600  . norepinephrine (LEVOPHED) Adult infusion 4 mcg/min (02/01/13 1034)    History reviewed. No pertinent past medical history. Per H&P: H/o Ischemic cardiomyopathy EF of 40%, CAD, CKD, baseline creatinine 2-2.5, Gout, DM   History reviewed. No pertinent past surgical history.  Clarene Duke RD, LDN Pager [phone] After Hours  pager [phone]

## 2013-02-01 NOTE — Progress Notes (Signed)
Event: 01/31/13  2130:  Several notifications since beginning of shift regarding pt's persistent hypotension. To this point hypotension has not responded to multiple IVF boluses. Pt now less responsive than earlier. Family at bedside. NP to bedside. Subjective: Pt unable to provide information d/t decreased LOC. Spoke w/ family at bedside regarding their wishes regarding aggressive vs comfort care given pt's DNR status and documentation that she declined dialysis. Daughter and daughter-in-law at bedside relate that they want to pursue agressive management. Dialysis or intubation for short if it might lead to some level of recovery is what family wishes to pursue.  Objective: Ms. Benbrook is an 79 y.o. female with h/o Ischemic cardiomyopathy EF of 40%, CAD, CKD, (baseline creatinine 2-2.5), gout and DM who was brought to Dr. Vicente Males office by her daughter today because of increasing lethargy and swelling. Pt  lives at home with daughter and she just got back from a family trip in [place]. Per family they think she has missed her medicines/lasix for 4-5 days, they deny any NSAID use. They also reported poor PO intake, worsening lower ext edema and breathing heavier for 4 days. In addition, she fell last week bruising her right anterior chest and left upper back etc. No reported h/o fevers, chills, vomiting or diarrhea. Upon evaluation in Dr.Avva's office she was noted to be volume overloaded and labs revealed creatinine of 6.7, K of 6.5, Co2 of 13. Pt was referred for direct admission. At bedside pt noted very pale. She arouses to noxious stimuli but remains quite lethargic. She is noted in no acute distress. BBS w/ coarse crackles bil. Current VS, T-93.3, BP-67/45, P-70, R-12-15 w/ 02 sats of 93% on r/a. Lactic acid 0.7. CBC, B-Met and CE's pending.  Assessment/Plan: 1. Persistent hypotension in clinical setting of pt w/ ischemic cardiomyopathy, acute on chronic renal failure w/ volume overload. Has not responded  to total 1L bolus. Dopamine ordered to be started at 17mcq/min. Will place A-Line.  Consulted w/ critical care service as pt now a full code status. Dr Cathi Roan has recommended Levophed instead of Dopamine and relates that Dr Marin Shutter will evaluate pt.                          2. Hypothermia: ? SIRS though no apparent infectious source. Bear-hugger until pt's temp normalizes.  Will transfer pt to ICU level of care. Appreciate CCM input. Will continue to monitor closely.  Leanne Chang, NP-C [facility] Pager [phone]

## 2013-02-02 ENCOUNTER — Inpatient Hospital Stay (HOSPITAL_COMMUNITY): Payer: Medicare Other

## 2013-02-02 LAB — CBC
MCV: 90 fL (ref 78.0–100.0)
Platelets: 203 10*3/uL (ref 150–400)
RBC: 3.8 MIL/uL — ABNORMAL LOW (ref 3.87–5.11)
WBC: 11.4 10*3/uL — ABNORMAL HIGH (ref 4.0–10.5)

## 2013-02-02 LAB — PROTEIN ELECTROPHORESIS, SERUM
Beta 2: 7 % — ABNORMAL HIGH (ref 3.2–6.5)
Beta Globulin: 6 % (ref 4.7–7.2)
M-Spike, %: NOT DETECTED g/dL

## 2013-02-02 LAB — DIFFERENTIAL
Basophils Relative: 0 % (ref 0–1)
Eosinophils Absolute: 0.1 10*3/uL (ref 0.0–0.7)
Lymphs Abs: 1.9 10*3/uL (ref 0.7–4.0)
Monocytes Absolute: 0.9 10*3/uL (ref 0.1–1.0)
Monocytes Relative: 8 % (ref 3–12)
Neutrophils Relative %: 75 % (ref 43–77)

## 2013-02-02 LAB — GLUCOSE, CAPILLARY
Glucose-Capillary: 137 mg/dL — ABNORMAL HIGH (ref 70–99)
Glucose-Capillary: 72 mg/dL (ref 70–99)
Glucose-Capillary: 144 mg/dL — ABNORMAL HIGH (ref 70–99)
Glucose-Capillary: 115 mg/dL — ABNORMAL HIGH (ref 70–99)

## 2013-02-02 LAB — BASIC METABOLIC PANEL
CO2: 14 mEq/L — ABNORMAL LOW (ref 19–32)
Calcium: 7.7 mg/dL — ABNORMAL LOW (ref 8.4–10.5)
Sodium: 134 mEq/L — ABNORMAL LOW (ref 135–145)

## 2013-02-02 LAB — TSH: TSH: 1.439 u[IU]/mL (ref 0.350–4.500)

## 2013-02-02 MED ORDER — INSULIN ASPART 100 UNIT/ML ~~LOC~~ SOLN
0.0000 [IU] | Freq: Every day | SUBCUTANEOUS | Status: DC
  Administered 2013-02-04: 2 [IU] via SUBCUTANEOUS
  Administered 2013-02-06: 3 [IU] via SUBCUTANEOUS

## 2013-02-02 MED ORDER — SODIUM CHLORIDE 0.9 % IV SOLN: INTRAVENOUS | Status: DC

## 2013-02-02 MED ORDER — INSULIN ASPART 100 UNIT/ML ~~LOC~~ SOLN
0.0000 [IU] | Freq: Three times a day (TID) | SUBCUTANEOUS | Status: DC
  Administered 2013-02-02: 2 [IU] via SUBCUTANEOUS
  Administered 2013-02-03: 5 [IU] via SUBCUTANEOUS
  Administered 2013-02-03 (×2): 2 [IU] via SUBCUTANEOUS
  Administered 2013-02-04 (×2): 3 [IU] via SUBCUTANEOUS
  Administered 2013-02-04: 6 [IU] via SUBCUTANEOUS
  Administered 2013-02-05: 2 [IU] via SUBCUTANEOUS
  Administered 2013-02-05 – 2013-02-06 (×3): 3 [IU] via SUBCUTANEOUS
  Administered 2013-02-06: 2 [IU] via SUBCUTANEOUS
  Administered 2013-02-06: 5 [IU] via SUBCUTANEOUS
  Administered 2013-02-07: 2 [IU] via SUBCUTANEOUS
  Administered 2013-02-07: 3 [IU] via SUBCUTANEOUS
  Administered 2013-02-08 (×2): 2 [IU] via SUBCUTANEOUS
  Administered 2013-02-08: 8 [IU] via SUBCUTANEOUS
  Administered 2013-02-09 (×2): 3 [IU] via SUBCUTANEOUS
  Administered 2013-02-10: 2 [IU] via SUBCUTANEOUS
  Administered 2013-02-10: 3 [IU] via SUBCUTANEOUS
  Administered 2013-02-11: 2 [IU] via SUBCUTANEOUS

## 2013-02-02 MED ORDER — FUROSEMIDE 10 MG/ML IJ SOLN
100.0000 mg | Freq: Four times a day (QID) | INTRAVENOUS | Status: DC
  Administered 2013-02-02 – 2013-02-03 (×4): 100 mg via INTRAVENOUS
  Filled 2013-02-02 (×6): qty 10

## 2013-02-02 MED ORDER — HYDROCORTISONE SOD SUCCINATE 100 MG IJ SOLR
50.0000 mg | Freq: Three times a day (TID) | INTRAMUSCULAR | Status: DC
  Administered 2013-02-02 – 2013-02-04 (×6): 50 mg via INTRAVENOUS
  Filled 2013-02-02 (×9): qty 1

## 2013-02-02 MED ORDER — SODIUM BICARBONATE 650 MG PO TABS
650.0000 mg | ORAL_TABLET | Freq: Two times a day (BID) | ORAL | Status: DC
  Administered 2013-02-02 – 2013-02-04 (×6): 650 mg via ORAL
  Filled 2013-02-02 (×8): qty 1

## 2013-02-02 MED ORDER — INSULIN ASPART 100 UNIT/ML ~~LOC~~ SOLN
3.0000 [IU] | Freq: Three times a day (TID) | SUBCUTANEOUS | Status: DC
  Administered 2013-02-02 – 2013-02-11 (×22): 3 [IU] via SUBCUTANEOUS

## 2013-02-02 MED ORDER — STERILE WATER FOR INJECTION IV SOLN
INTRAVENOUS | Status: DC
  Administered 2013-02-02: 11:00:00 via INTRAVENOUS
  Filled 2013-02-02 (×2): qty 850

## 2013-02-02 MED ORDER — STERILE WATER FOR INJECTION IV SOLN
INTRAVENOUS | Status: DC
  Filled 2013-02-02: qty 850

## 2013-02-02 NOTE — Progress Notes (Signed)
cbg 76 . Pt drank 1 1/2 apple juices.

## 2013-02-02 NOTE — Progress Notes (Signed)
PULMONARY  / CRITICAL CARE MEDICINE  Name: Miranda Cruz MRN: [id number] DOB: [birth year]    ADMISSION DATE:  01/31/2013 CONSULTATION DATE:  01/31/2013  REFERRING MD :  [facility] PRIMARY SERVICE:  PCCM  CHIEF COMPLAINT:  Hypotension  BRIEF PATIENT DESCRIPTION: 79 yo with past medical history of CAD, ischemic cardiomyopathy (EF 40%) and CKD (Cr 2.5) admitted after found her Cr to be elevated to 6.7 and K elevated to 6.5.  Initially DNR / refused dialysis, but code status overturned by family. PCCM was consulted.  SIGNIFICANT EVENTS / STUDIES:  5/27  Admitted with acute on chronic renal failure, hyperkalemia, hypotension 5/28 Renal US: No evidence of hydronephrosis; no acute abnormality seen to explain the patient's renal failure  LINES / TUBES: RUE PICC 5/27 >>  L rad A-line 5/27 >>   CULTURES: 5/27 Blood >>  5/27 Urine >>   ANTIBIOTICS:   SUBJ: RASS 0 to -1. No distress. No new complaints   VITAL SIGNS: Temp:  [97.6 F (36.4 C)-98.9 F (37.2 C)] 97.9 F (36.6 C) (05/29 1200) Pulse Rate:  [64-82] 72 (05/29 1300) Resp:  [7-19] 15 (05/29 1300) BP: (81-139)/(33-77) 137/48 mmHg (05/29 1300) SpO2:  [94 %-100 %] 100 % (05/29 1300) Arterial Line BP: (93-141)/(36-59) 141/59 mmHg (05/29 1300) Weight:  [88.7 kg (195 lb 8.8 oz)] 88.7 kg (195 lb 8.8 oz) (05/29 0500) HEMODYNAMICS: CVP:  [9 mmHg-20 mmHg] 15 mmHg VENTILATOR SETTINGS:   INTAKE / OUTPUT: Intake/Output     05/28 0701 - 05/29 0700 05/29 0701 - 05/30 0700   P.O.  60   I.V. (mL/kg) 1092.2 (12.3) 327.4 (3.7)   IV Piggyback 910 60   Total Intake(mL/kg) 2002.2 (22.6) 447.4 (5)   Urine (mL/kg/hr) 1700 (0.8)    Emesis/NG output     Total Output 1700     Net +302.2 +447.4        Stool Occurrence 1 x     PHYSICAL EXAMINATION: General: NAD. More alert Neuro: No focal deficits HEENT: WNL. PERRL Cardiovascular:  RRR s M Lungs: no adventitious sounds anteriorly Abdomen:  Soft, nontender, bowel sounds diminished Ext: symmetric  pitting edema   LABS: BMET    Component Value Date/Time   NA 134* 02/02/2013 0500   K 5.1 02/02/2013 0500   CL 99 02/02/2013 0500   CO2 14* 02/02/2013 0500   GLUCOSE 212* 02/02/2013 0500   BUN 123* 02/02/2013 0500   CREATININE 5.65* 02/02/2013 0500   CALCIUM 7.7* 02/02/2013 0500   GFRNONAA 6* 02/02/2013 0500   GFRAA 7* 02/02/2013 0500    CBC    Component Value Date/Time   WBC 11.4* 02/02/2013 0500   RBC 3.80* 02/02/2013 0500   HGB 11.0* 02/02/2013 0500   HCT 34.2* 02/02/2013 0500   PLT 203 02/02/2013 0500   MCV 90.0 02/02/2013 0500   MCH 28.9 02/02/2013 0500   MCHC 32.2 02/02/2013 0500   RDW 14.9 02/02/2013 0500   LYMPHSABS 1.9 02/02/2013 0500   MONOABS 0.9 02/02/2013 0500   EOSABS 0.1 02/02/2013 0500   BASOSABS 0.0 02/02/2013 0500      Recent Labs Lab 02/01/13 1941 02/01/13 2354 02/02/13 0310 02/02/13 0751 02/02/13 1148  GLUCAP 137* 72 114* 144* 132*    CXR:  Increased bibasilar effusions vs atx  ASSESSMENT / PLAN:  RENAL A:  Acute on chronic renal failure - now nonoliguric on furosemide Hyperkalemia, improving.  Hypervolemic hyponatremia.   Metabolic acidosis. P:   Renal following Lasix per Renal  Monitor BMET Avoid nephrotoxins  CARDIOVASCULAR A: Hypotension -etiology unclear. Cortisol low   Ischemic cardiomyopathy.  doubt SIRS. P:  Cont vasopressors to maintain MAP > 70 mmHg  Begin hydrocortisone 5/29    NEUROLOGIC A:  Acute encephalopathy, improved - likely uremic. P:   Holding Xanax, Oxycodone   PULMONARY A:  Mild hypoxemia - likely due to atx and mild edema/effusions P:   Gaol SpO2>92 Supplemental oxygen   GASTROINTESTINAL A:  No active issues. P:   Begin diet   HEMATOLOGIC A:  Mild anemia. P:  Trend CBC Heparin for DVT Px   INFECTIOUS A:  No evidence of infection. P:   F/U cx results Trend PCT Monitor off abx   Daughter updated @ bedside   TODAY'S SUMMARY:    I have personally obtained a history, examined the patient,  evaluated laboratory and imaging results, formulated the assessment and plan and placed orders.  CRITICAL CARE:    Billy Fischer, MD Pulmonary and Critical Care Medicine [facility] Pager: [phone]  02/02/2013, 2:09 PM

## 2013-02-02 NOTE — Progress Notes (Signed)
Notified dr. deterding that map in the 50's and at max rate for levophed.  md okayed to go to max of 30 mcg on levophed

## 2013-02-02 NOTE — Progress Notes (Signed)
[facility]  Subjective:  Awake, alert, daughter Miranda Cruz at bedside On NE 28 mcg/min, CVP 15 02 sat 97% on 2 L O2   Objective: Vital signs in last 24 hours:  BP at 9 AM 122/45 Blood pressure 96/34, pulse 70, temperature 98.9 F (37.2 C), temperature source Oral, resp. rate 16, height 5\' 5"  (1.651 m), weight 88.7 kg (195 lb 8.8 oz), SpO2 99.00%.    PHYSICAL EXAM General--as above  Chest--rhonchi  Heart--no rub  Abd--nontender  Extr--2+ edema persists, bruising over L 2nd and 3rd toes  I/O last 24 hr--1972/1700  Date  Weight 12 May  80 kg (in Dr. Vicente Males office) 27 May  84.7 28 May  87.9 29 May  88.7  Lab Results:   Recent Labs Lab 02/01/13 1300 02/01/13 1945 02/02/13 0500  NA 137 139 134*  K 5.5* 4.8 5.1  CL 102 104 99  CO2 13* 17* 14*  BUN 130* 127* 123*  CREATININE 6.08* 5.57* 5.65*  GLUCOSE 125* 154* 212*  CALCIUM 7.8* 7.4* 7.7*     Recent Labs  02/01/13 0411 02/02/13 0500  WBC 12.5* 11.4*  HGB 11.6* 11.0*  HCT 36.2 34.2*  PLT 191 203   I have reviewed the patient's current medications.  Assessment/Plan: 1. Acute worsening of renal function in patient with underlying chronic kidney disease--urine output better with diuretics.  She has gained 19 lb in 2 weeks.  CVP is 15.  I don't think she needs additional IV fluids. K 5.1 today, bicarb 14.  Rx po bicarb 650 BID and change IVF (KVOs) to sterile H2O with 150 bicarb.   2. DM-2-- Per primary service 3. High BP-- lisinopril held, blood pressure better on NE.  I am puzzled by her low BP. No evidence of MI.   Echo 28 May showed 45-50% EF--hypokinesis of anteroseptal wall.   No tamponade on echo.  Blood cultures neg so far. Check CXR today 4.  Altered mental status--per primary service 5. Gout-- On allopurinol no eosinophilia or pyuria.  Check uric acid 6. Ischemic cardiomyopathy- 2-D echo as above--last echo in Dr. Arminda Resides office January 2010 EF 40-45%), continue RX  furosemide 7. Hypothyroidism--continue with thyroid replacement therapy 8. Bruising L 2nd and 3rd toes--X Ray shows no fracture        LOS: 2 days   Devery Murgia F 02/02/2013,8:49 AM   .labalb

## 2013-02-03 ENCOUNTER — Inpatient Hospital Stay (HOSPITAL_COMMUNITY): Payer: Medicare Other

## 2013-02-03 ENCOUNTER — Encounter (HOSPITAL_COMMUNITY): Payer: Self-pay | Admitting: *Deleted

## 2013-02-03 LAB — RENAL FUNCTION PANEL
BUN: 128 mg/dL — ABNORMAL HIGH (ref 6–23)
Chloride: 100 mEq/L (ref 96–112)
GFR calc Af Amer: 8 mL/min — ABNORMAL LOW (ref >90)
Glucose, Bld: 139 mg/dL — ABNORMAL HIGH (ref 70–99)
Potassium: 5 mEq/L (ref 3.5–5.1)

## 2013-02-03 LAB — UIFE/LIGHT CHAINS/TP QN, 24-HR UR
Albumin, U: DETECTED
Alpha 1, Urine: DETECTED — AB
Gamma Globulin, Urine: DETECTED — AB

## 2013-02-03 LAB — MAGNESIUM: Magnesium: 1.2 mg/dL — ABNORMAL LOW (ref 1.5–2.5)

## 2013-02-03 LAB — GLUCOSE, CAPILLARY: Glucose-Capillary: 149 mg/dL — ABNORMAL HIGH (ref 70–99)

## 2013-02-03 MED ORDER — BOOST / RESOURCE BREEZE PO LIQD
1.0000 | Freq: Two times a day (BID) | ORAL | Status: DC
  Administered 2013-02-03 – 2013-02-10 (×9): 1 via ORAL
  Filled 2013-02-03: qty 1

## 2013-02-03 MED ORDER — MAGNESIUM SULFATE 40 MG/ML IJ SOLN
2.0000 g | Freq: Once | INTRAMUSCULAR | Status: AC
  Administered 2013-02-03: 2 g via INTRAVENOUS
  Filled 2013-02-03: qty 50

## 2013-02-03 MED ORDER — FUROSEMIDE 10 MG/ML IJ SOLN
100.0000 mg | Freq: Four times a day (QID) | INTRAVENOUS | Status: DC
  Administered 2013-02-03 – 2013-02-05 (×7): 100 mg via INTRAVENOUS
  Filled 2013-02-03 (×12): qty 10

## 2013-02-03 MED ORDER — SEVELAMER CARBONATE 800 MG PO TABS
800.0000 mg | ORAL_TABLET | Freq: Three times a day (TID) | ORAL | Status: DC
  Administered 2013-02-03 – 2013-02-10 (×20): 800 mg via ORAL
  Filled 2013-02-03 (×28): qty 1

## 2013-02-03 MED ORDER — LEVOTHYROXINE SODIUM 125 MCG PO TABS
125.0000 ug | ORAL_TABLET | Freq: Every day | ORAL | Status: DC
  Administered 2013-02-04 – 2013-02-11 (×8): 125 ug via ORAL
  Filled 2013-02-03 (×9): qty 1

## 2013-02-03 NOTE — Progress Notes (Addendum)
PULMONARY  / CRITICAL CARE MEDICINE  Name: Miranda Cruz MRN: [id number] DOB: [birth year]    ADMISSION DATE:  01/31/2013 CONSULTATION DATE:  01/31/2013  REFERRING MD :  [facility] PRIMARY SERVICE:  PCCM  CHIEF COMPLAINT:  Hypotension  BRIEF PATIENT DESCRIPTION: 79 yo with past medical history of CAD, ischemic cardiomyopathy (EF 40%) and CKD (Cr 2.5) admitted after found her Cr to be elevated to 6.7 and K elevated to 6.5.  Initially DNR / refused dialysis, but code status overturned by family. PCCM was consulted.  SIGNIFICANT EVENTS / STUDIES:  5/27  Admitted with acute on chronic renal failure, hyperkalemia, hypotension 5/28 Renal US: No evidence of hydronephrosis; no acute abnormality seen to explain the patient's renal failure 5/30 Off pressors   LINES / TUBES: RUE PICC 5/27 >>  L rad A-line 5/27 >> 5/30  CULTURES: 5/27 Blood >>  5/27 Urine >> NEG  ANTIBIOTICS:   SUBJ: RASS 0. No distress. No new complaints. More alert and better oriented   VITAL SIGNS: Temp:  [97.6 F (36.4 C)-98.2 F (36.8 C)] 97.9 F (36.6 C) (05/30 1200) Pulse Rate:  [69-70] 70 (05/30 1400) Resp:  [11-20] 13 (05/30 1400) BP: (94-154)/(27-59) 101/35 mmHg (05/30 1400) SpO2:  [94 %-100 %] 96 % (05/30 1400) Arterial Line BP: (132-182)/(42-59) 143/47 mmHg (05/30 1100) Weight:  [86.5 kg (190 lb 11.2 oz)] 86.5 kg (190 lb 11.2 oz) (05/30 0437) HEMODYNAMICS: CVP:  [9 mmHg-16 mmHg] 9 mmHg VENTILATOR SETTINGS:   INTAKE / OUTPUT: Intake/Output     05/29 0701 - 05/30 0700 05/30 0701 - 05/31 0700   P.O. 140    I.V. (mL/kg) 1013 (11.7) 181.9 (2.1)   IV Piggyback 120 60   Total Intake(mL/kg) 1273 (14.7) 241.9 (2.8)   Urine (mL/kg/hr) 2500 (1.2) 1375 (2)   Total Output 2500 1375   Net -1227 -1133.1         PHYSICAL EXAMINATION: General: NAD. Well oriented Neuro: No focal deficits HEENT: WNL.  Cardiovascular:  RRR s M Lungs: no adventitious sounds anteriorly Abdomen:  Soft, nontender, bowel sounds  diminished Ext: symmetric pitting edema   LABS: BMET    Component Value Date/Time   NA 138 02/03/2013 0415   K 5.0 02/03/2013 0415   CL 100 02/03/2013 0415   CO2 18* 02/03/2013 0415   GLUCOSE 139* 02/03/2013 0415   BUN 128* 02/03/2013 0415   CREATININE 5.48* 02/03/2013 0415   CALCIUM 8.1* 02/03/2013 0415   GFRNONAA 7* 02/03/2013 0415   GFRAA 8* 02/03/2013 0415    CBC    Component Value Date/Time   WBC 11.4* 02/02/2013 0500   RBC 3.80* 02/02/2013 0500   HGB 11.0* 02/02/2013 0500   HCT 34.2* 02/02/2013 0500   PLT 203 02/02/2013 0500   MCV 90.0 02/02/2013 0500   MCH 28.9 02/02/2013 0500   MCHC 32.2 02/02/2013 0500   RDW 14.9 02/02/2013 0500   LYMPHSABS 1.9 02/02/2013 0500   MONOABS 0.9 02/02/2013 0500   EOSABS 0.1 02/02/2013 0500   BASOSABS 0.0 02/02/2013 0500      Recent Labs Lab 02/02/13 1555 02/02/13 1642 02/02/13 2017 02/03/13 0755 02/03/13 1217  GLUCAP 101* 108* 115* 126* 149*    CXR:  Improved aeration  ASSESSMENT / PLAN:  RENAL A:  Acute on chronic renal failure - now nonoliguric on furosemide Hyperkalemia, improving.  Hypervolemic hyponatremia, resolved  Metabolic acidosis, improving P:   Renal following Lasix per Renal  Monitor BMET Avoid nephrotoxins   CARDIOVASCULAR A: Hypotension -etiology unclear.  Low cortisol -   Ischemic cardiomyopathy.  doubt SIRS. P:  Monitor off vasopressors   Cont hydrocortisone started 5/29    NEUROLOGIC A:  Acute encephalopathy, resolved P:   Holding Xanax, Oxycodone   PULMONARY A:  Mild hypoxemia - likely due to atx and mild edema/effusions P:   Gaol SpO2>92 Supplemental oxygen   GASTROINTESTINAL A:  No active issues. P:   Cont diet   HEMATOLOGIC A:  Mild anemia. P:  Trend CBC Heparin for DVT Px   INFECTIOUS A:  No evidence of infection. P:   F/U cx results Trend PCT Monitor off abx  ENDOCRINE A: DM2 Low cortisol P: Cont SSI Cont hydrocortisone. Would wean hydrocortisone to off as permitted  by BP, then perform cosyntropin stim test prior to discharge to confirm or refute dx of adrenal insuff     If remains off pressors 5/30, can be transferred out of ICU 5/31 and to [facility] service    Billy Fischer, MD Pulmonary and Critical Care Medicine [facility] Pager: [phone]  02/03/2013, 2:54 PM

## 2013-02-03 NOTE — Progress Notes (Signed)
NUTRITION FOLLOW UP  Intervention:   Resource Breeze po BID, each supplement provides 250 kcal and 9 grams of protein.   NUTRITION DIAGNOSIS:  Inadequate oral intake related to "no appetite" as evidenced by weight loss. Ongoing   Goal:  Diet advance to meet >/=75% estimated nutrition needs. Improving  Monitor:  weight trends, labs, I/O's, PO intake   Assessment:   Pt more alert, diet advanced. Weight up from admission weight, likely related to fluids. Continues with 2+ edema in extremities.  Pt likes Ensure Clear beverage, will add Resource Breeze as this facility does not stock Ensure Clear. Ate 25% of one meal so far.   Height: Ht Readings from Last 1 Encounters:  01/31/13 5\' 5"  (1.651 m)    Weight Status:   Wt Readings from Last 1 Encounters:  02/03/13 190 lb 11.2 oz (86.5 kg)  186 lbs on admission   Re-estimated needs:  Kcal: 1900-2100  Protein: 55-65 gm  Fluid: >/= 1.2 L   Skin: stage I on sacrum and groin. Edema 2+ BLE  Diet Order: Carb Control   Intake/Output Summary (Last 24 hours) at 02/03/13 1347 Last data filed at 02/03/13 1200  Gross per 24 hour  Intake 1037.47 ml  Output   3525 ml  Net -2487.53 ml    Last BM: 5/28   Labs:   Recent Labs Lab 02/01/13 1945 02/02/13 0500 02/03/13 0415  NA 139 134* 138  K 4.8 5.1 5.0  CL 104 99 100  CO2 17* 14* 18*  BUN 127* 123* 128*  CREATININE 5.57* 5.65* 5.48*  CALCIUM 7.4* 7.7* 8.1*  MG  --  1.3* 1.2*  PHOS  --   --  8.4*  GLUCOSE 154* 212* 139*    CBG (last 3)   Recent Labs  02/02/13 2017 02/03/13 0755 02/03/13 1217  GLUCAP 115* 126* 149*    Scheduled Meds: . furosemide  100 mg Intravenous Q6H  . heparin subcutaneous  5,000 Units Subcutaneous Q8H  . hydrocortisone sod succinate (SOLU-CORTEF) inj  50 mg Intravenous Q8H  . insulin aspart  0-15 Units Subcutaneous TID WC  . insulin aspart  0-5 Units Subcutaneous QHS  . insulin aspart  3 Units Subcutaneous TID WC  . [START ON 02/04/2013]  levothyroxine  125 mcg Oral QAC breakfast  . sevelamer carbonate  800 mg Oral TID WC  . sodium bicarbonate  650 mg Oral BID  . sodium chloride  10-40 mL Intracatheter Q12H    Continuous Infusions: . sodium chloride 10 mL/hr at 02/02/13 1015  .  sodium bicarbonate 150 mEq in sterile water 1000 mL infusion 20 mL/hr at 02/02/13 1716    Clarene Duke RD, LDN Pager [phone] After Hours pager [phone]

## 2013-02-03 NOTE — Progress Notes (Signed)
eLink Physician-Brief Progress Note Patient Name: Miranda Cruz DOB: June 12, 1930 MRN: [id number]  Date of Service  02/03/2013   HPI/Events of Note  RN reports difficulty swallowing pills -pre admit   eICU Interventions  Swallow eval ordered      Erland Vivas V. 02/03/2013, 4:43 PM

## 2013-02-03 NOTE — Progress Notes (Signed)
Hypomagnesemia   Mg replaced  

## 2013-02-03 NOTE — Progress Notes (Signed)
Called CCM Dr Bard Herbert about patient decreasing blood pressure. CCM (Simmonds) advised to hold lasix and to contact again if systolic lower than 90. Will continue to monitor patient.

## 2013-02-03 NOTE — Progress Notes (Signed)
[facility]  Subjective:  Awake, alert, daughter-in-law Ishmael Holter, RN) at bedside On Less NE  51mcg/min (BP better once hydrocortisone started).   Objective: Vital signs in last 24 hours: Blood pressure 94/37, pulse 70, temperature 97.6 F (36.4 C), temperature source Oral, resp. rate 14, height 5\' 5"  (1.651 m), weight 86.5 kg (190 lb 11.2 oz), SpO2 98.00%.    PHYSICAL EXAM General--as above Chest--rhonchi  Heart--no rub  Abd--nontender  Extr--2+ edema persists, bruising over L 2nd and 3rd toes  I/O last 24 hr--1273/2500  Date   Weight  12 May   80 kg (in Dr. Vicente Males office)  27 May   84.7  28 May  87.9  29 May  88.7 30 May  86.5  Lab Results:   Recent Labs Lab 02/01/13 1945 02/02/13 0500 02/03/13 0415  NA 139 134* 138  K 4.8 5.1 5.0  CL 104 99 100  CO2 17* 14* 18*  BUN 127* 123* 128*  CREATININE 5.57* 5.65* 5.48*  GLUCOSE 154* 212* 139*  CALCIUM 7.4* 7.7* 8.1*  PHOS  --   --  8.4*     Recent Labs  02/01/13 0411 02/02/13 0500  WBC 12.5* 11.4*  HGB 11.6* 11.0*  HCT 36.2 34.2*  PLT 191 203     I have reviewed the patient's current medications. Scheduled: . furosemide  100 mg Intravenous Q6H  . heparin subcutaneous  5,000 Units Subcutaneous Q8H  . hydrocortisone sod succinate (SOLU-CORTEF) inj  50 mg Intravenous Q8H  . insulin aspart  0-15 Units Subcutaneous TID WC  . insulin aspart  0-5 Units Subcutaneous QHS  . insulin aspart  3 Units Subcutaneous TID WC  . levothyroxine  75 mcg Intravenous Daily  . sodium bicarbonate  650 mg Oral BID  . sodium chloride  10-40 mL Intracatheter Q12H   Continuous: . sodium chloride 10 mL/hr at 02/02/13 1015  . norepinephrine (LEVOPHED) Adult infusion 2 mcg/min (02/03/13 0500)  .  sodium bicarbonate 150 mEq in sterile water 1000 mL infusion 20 mL/hr at 02/02/13 1716    Assessment/Plan: 1. Acute worsening of renal function in patient with underlying chronic kidney disease--SPEP and UPEP  pending.  On IV bicarb and furosemide 100 Q6 hr.  Phos 8--begin renvela.  Cr a little lower today.  Mg low-getting IV MgSO4 2. DM-2-- Per primary service 3. Hypotension-- lisinopril held, blood pressure better on NE and hydrocortisone.  Will need formal testing for adrenal insufficiency once better 4. Altered mental status--per primary service 5. Gout-- On allopurinol no eosinophilia or pyuria. Uric acid 7 on 29 May 6. Ischemic cardiomyopathy- 2-D echo with 40-45% EF on 28 May 7. Hypothyroidism--continue with thyroid replacement therapy 8. Bruising L 2nd and 3rd toes--X Ray shows no fracture        LOS: 3 days   Raquell Richer F 02/03/2013,8:12 AM   .labalb

## 2013-02-03 NOTE — Progress Notes (Signed)
[facility] ADULT ICU REPLACEMENT PROTOCOL FOR AM LAB REPLACEMENT ONLY  The patient does not apply for the [facility] Adult ICU Electrolyte Replacment Protocol based on the criteria listed below:   1. Is GFR >/= 40 ml/min? no  Patient's GFR today is 7 2. Is urine output >/= 0.5 ml/kg/hr for the last 6 hours? no Patient's UOP is  ml/kg/hr 3. Is BUN < 60 mg/dL? no  Patient's BUN today is 128 4. Abnormal electrolyte(s): Mag 1.2 5. Ordered repletion with: NA 6. If a panic level lab has been reported, has the CCM MD in charge been notified? yes.   Physician:  Dr Salvadore Farber, Sherie Dobrowolski A 02/03/2013 5:38 AM

## 2013-02-04 LAB — RENAL FUNCTION PANEL
Calcium: 7.7 mg/dL — ABNORMAL LOW (ref 8.4–10.5)
Creatinine, Ser: 4.5 mg/dL — ABNORMAL HIGH (ref 0.50–1.10)
GFR calc Af Amer: 10 mL/min — ABNORMAL LOW (ref >90)
GFR calc non Af Amer: 8 mL/min — ABNORMAL LOW (ref >90)
Glucose, Bld: 148 mg/dL — ABNORMAL HIGH (ref 70–99)
Phosphorus: 6.5 mg/dL — ABNORMAL HIGH (ref 2.3–4.6)
Sodium: 143 mEq/L (ref 135–145)

## 2013-02-04 LAB — GLUCOSE, CAPILLARY
Glucose-Capillary: 167 mg/dL — ABNORMAL HIGH (ref 70–99)
Glucose-Capillary: 235 mg/dL — ABNORMAL HIGH (ref 70–99)

## 2013-02-04 MED ORDER — MAGNESIUM SULFATE 40 MG/ML IJ SOLN
2.0000 g | Freq: Once | INTRAMUSCULAR | Status: AC
  Administered 2013-02-04: 2 g via INTRAVENOUS
  Filled 2013-02-04: qty 50

## 2013-02-04 MED ORDER — POTASSIUM CHLORIDE CRYS ER 20 MEQ PO TBCR
40.0000 meq | EXTENDED_RELEASE_TABLET | Freq: Once | ORAL | Status: DC
  Filled 2013-02-04 (×2): qty 2

## 2013-02-04 MED ORDER — HYDROCORTISONE SOD SUCCINATE 100 MG IJ SOLR
50.0000 mg | Freq: Two times a day (BID) | INTRAMUSCULAR | Status: DC
  Administered 2013-02-04 – 2013-02-05 (×3): 50 mg via INTRAVENOUS
  Filled 2013-02-04 (×4): qty 1

## 2013-02-04 NOTE — Progress Notes (Signed)
Pt transferred via bed to 2031, on transfer pt was on tele monitor, vss, no c/o of pain, report given to Melina Copa, pt daughter [name] was notified of the transfer.

## 2013-02-04 NOTE — Progress Notes (Signed)
PULMONARY  / CRITICAL CARE MEDICINE  Name: Miranda Cruz MRN: [id number] DOB: 1930-01-01    ADMISSION DATE:  01/31/2013 CONSULTATION DATE:  01/31/2013  REFERRING MD :  [facility] PRIMARY SERVICE:  PCCM  CHIEF COMPLAINT:  Hypotension  BRIEF PATIENT DESCRIPTION: 79 yo with past medical history of CAD, ischemic cardiomyopathy (EF 40%) and CKD (Cr 2.5) admitted 5/27 after found her Cr to be elevated to 6.7 and K elevated to 6.5.  Initially DNR / refused dialysis, but code status overturned by family. PCCM was consulted.  SIGNIFICANT EVENTS / STUDIES:  5/27  Admitted with acute on chronic renal failure, hyperkalemia, hypotension 5/28 Renal US: No evidence of hydronephrosis; no acute abnormality seen to explain the patient's renal failure 5/30 Off pressors   LINES / TUBES: RUE PICC 5/27 >>  L rad A-line 5/27 >> 5/30  CULTURES: 5/27 Blood >>  5/27 Urine >> NEG  ANTIBIOTICS: none  SUBJ: Off pressors.  Awake and alert.  No c/o.  Feeling much better.    VITAL SIGNS: Temp:  [97 F (36.1 C)-98 F (36.7 C)] 98 F (36.7 C) (05/31 0753) Pulse Rate:  [69-71] 70 (05/31 0800) Resp:  [11-19] 15 (05/31 0800) BP: (89-124)/(33-56) 124/43 mmHg (05/31 0800) SpO2:  [93 %-100 %] 96 % (05/31 0800) Arterial Line BP: (134-158)/(42-48) 143/47 mmHg (05/30 1100) Weight:  [184 lb 15.5 oz (83.9 kg)] 184 lb 15.5 oz (83.9 kg) (05/31 0500) HEMODYNAMICS:   VENTILATOR SETTINGS:   INTAKE / OUTPUT: Intake/Output     05/30 0701 - 05/31 0700 05/31 0701 - 06/01 0700   P.O. 474    I.V. (mL/kg) 661.9 (7.9)    IV Piggyback 230    Total Intake(mL/kg) 1365.9 (16.3)    Urine (mL/kg/hr) 3505 (1.7)    Total Output 3505     Net -2139.1           PHYSICAL EXAMINATION: General: NAD. Well oriented Neuro: No focal deficits HEENT: mm moist, WNL.  Cardiovascular:  RRR s M Lungs: resps even non labored on [place], no adventitious sounds anteriorly Abdomen:  Soft, nontender, bowel sounds diminished Ext: warm and dry, 1+  symmetric pitting edema   LABS: BMET  Recent Labs Lab 02/01/13 1300 02/01/13 1945 02/02/13 0500 02/03/13 0415 02/04/13 0420  NA 137 139 134* 138 143  K 5.5* 4.8 5.1 5.0 3.4*  CL 102 104 99 100 99  CO2 13* 17* 14* 18* 30  GLUCOSE 125* 154* 212* 139* 148*  BUN 130* 127* 123* 128* 126*  CREATININE 6.08* 5.57* 5.65* 5.48* 4.50*  CALCIUM 7.8* 7.4* 7.7* 8.1* 7.7*  MG  --   --  1.3* 1.2* 1.5  PHOS  --   --   --  8.4* 6.5*    CBC  Recent Labs Lab 01/31/13 2200 02/01/13 0411 02/02/13 0500  HGB 9.6* 11.6* 11.0*  HCT 29.6* 36.2 34.2*  WBC 6.1 12.5* 11.4*  PLT 124* 191 203     Recent Labs Lab 02/03/13 0755 02/03/13 1217 02/03/13 1757 02/03/13 2151 02/04/13 0759  GLUCAP 126* 149* 217* 132* 167*    CXR:   Dg Chest Port 1 View  02/03/2013   *RADIOLOGY REPORT*  Clinical Data: Respiratory failure.  PORTABLE CHEST - 1 VIEW  Comparison: Chest 02/02/2013 and 01/31/2013.  Findings: Right PICC remains in place.  Right pleural effusion and basilar airspace disease appear mildly improved.  Small left effusion and airspace disease are unchanged.  Heart size is upper normal.  No pneumothorax.  IMPRESSION: Some improvement  in a right pleural effusion basilar airspace disease.  No new abnormality.   Original Report Authenticated By: Holley Dexter, M.D.   Dg Chest Port 1 View  02/02/2013   *RADIOLOGY REPORT*  Clinical Data: Evaluate CHF.  PORTABLE CHEST - 1 VIEW  Comparison: 01/31/2013  Findings: Cardiomegaly.  Worsening bilateral perihilar and lower lobe airspace opacities, right greater than left.  This represent asymmetric edema or infection.  Small bilateral pleural effusions, also greater on the right, new since prior study.  Left pacer and right PICC line are unchanged.  No acute bony abnormality.  IMPRESSION: Worsening bilateral airspace opacities and effusions as above. These findings are greater on the right.  This could represent asymmetric edema/CHF or infection.   Original  Report Authenticated By: Charlett Nose, M.D.    ASSESSMENT / PLAN:  RENAL A:  Acute on chronic renal failure - now nonoliguric on furosemide Hyperkalemia, Resolved.  Hypervolemic hyponatremia, resolved  Metabolic acidosis, Resolved.  Hypomagnesia   P:   -Renal following -Lasix per Renal  -D/c bicarb gtt 5/31 -Monitor BMET -Avoid nephrotoxins -Recheck mg/phos in am   CARDIOVASCULAR A: Hypotension -etiology unclear.  Low cortisol -   Ischemic cardiomyopathy.  doubt SIRS. P:  -Monitor off vasopressors   -Cont hydrocortisone started 5/29, will change to q12, cont to wean as BP allows, will need cosyntropin stim test once off to assess adrenal insufficiency   NEUROLOGIC A:  Acute encephalopathy, resolved P:   -Holding Xanax, Oxycodone -Pt/ot ordered    PULMONARY A:  Mild hypoxemia - likely due to atx and mild edema/effusions P:   -Goal SpO2>92 -Supplemental oxygen   GASTROINTESTINAL A:  No active issues. ?dysphagia per nsg  P:   -Cont diet -Will have speech eval    HEMATOLOGIC A:  Mild anemia. P:  -Trend CBC -Heparin for DVT Px   INFECTIOUS A:  No evidence of infection. P:   -F/U cx results -Trend PCT -Monitor off abx  ENDOCRINE A: DM2 Low cortisol P: -Cont SSI -Cont hydrocortisone. -Would wean hydrocortisone to off as permitted by BP, then perform cosyntropin stim test prior to discharge to confirm or refute dx of adrenal insuff  Will tx to tele and ask [facility] to assume care 6/1.   Danford Bad, NP 02/04/2013  8:38 AM Pager: [phone] or [phone]  *Care during the described time interval was provided by me and/or other providers on the critical care team. I have reviewed this patient's available data, including medical history, events of note, physical examination and test results as part of my evaluation.   Patient seen and examined, agree with above note.  I dictated the care and orders written for this patient under my  direction.  Alyson Reedy, MD [phone]

## 2013-02-04 NOTE — Evaluation (Signed)
Clinical/Bedside Swallow Evaluation Patient Details  Name: Miranda Cruz MRN: [id number] Date of Birth: 1930/02/11  Today's Date: 02/04/2013 Time: 1027-1051 SLP Time Calculation (min): 24 min  Past Medical History: History reviewed. No pertinent past medical history. Past Surgical History: History reviewed. No pertinent past surgical history. HPI:  79 yo with past medical history of CAD, ischemic cardiomyopathy (EF 40%) and CKD (Cr 2.5) admitted 5/27 with acute on chronic renal failure.  Per RN, pt noted with frequent coughing during meals, hence swallow eval ordered.   Assessment / Plan / Recommendation Clinical Impression  Pt presents with mild dysphagia notable when consuming solid foods simultaneously with thin liquids.  Mixing consistencies leads to immediate coughing, due to likely aspiration of the thin liquids.  Pt cued to alternate solid/liquid boluses and avoid simultaneous consumption - when practiced, this strategy eliminated coughing.    REC: continued current regular/thin liquids; give meds whole in puree; assist with meals as needed.  SLP will f/u x1 to ensure improved toleration.      Aspiration Risk  Mild    Diet Recommendation Regular;Thin liquid   Liquid Administration via: Cup;Straw Medication Administration: Whole meds with puree Compensations: Follow solids with liquid    Other  Recommendations Oral Care Recommendations: Oral care BID   Follow Up Recommendations  None    Frequency and Duration min 1 x/week  1 week   Pertinent Vitals/Pain No pain    SLP Swallow Goals Patient will utilize recommended strategies during swallow to increase swallowing safety with: Modified independent assistance   Swallow Study Prior Functional Status  Type of Home: House Lives With: Daughter Available Help at Discharge: Family;Available 24 hours/day Vocation: Retired    General Date of Onset: 01/31/13 HPI: 79 yo with past medical history of CAD, ischemic cardiomyopathy  (EF 40%) and CKD (Cr 2.5) admitted 5/27 with acute on chronic renal failure.  Per RN, pt noted with frequent coughing during meals, hence swallow eval ordered. Type of Study: Bedside swallow evaluation Diet Prior to this Study: Regular;Thin liquids Temperature Spikes Noted: No Respiratory Status: Supplemental O2 delivered via (comment) Behavior/Cognition: Alert;Cooperative;Hard of hearing Oral Cavity - Dentition: Missing dentition Self-Feeding Abilities: Able to feed self;Needs assist Patient Positioning: Upright in chair Baseline Vocal Quality: Clear Volitional Cough: Strong Volitional Swallow: Able to elicit    Oral/Motor/Sensory Function Overall Oral Motor/Sensory Function: Appears within functional limits for tasks assessed   Ice Chips Ice chips: Within functional limits Presentation: Spoon   Thin Liquid Thin Liquid: Impaired Presentation: Straw;Cup Pharyngeal  Phase Impairments: Cough - Immediate (when consuming solids and liquids simultaneously)    Nectar Thick Nectar Thick Liquid: Not tested   Honey Thick Honey Thick Liquid: Not tested   Puree Puree: Within functional limits   Solid   GO    Solid: Impaired Presentation: Self Fed Pharyngeal Phase Impairments: Cough - Immediate (when consumed in conjunction with thin liquids)      Atavia Poppe L. Samson Frederic, [place] CCC/SLP Pager [phone]  Blenda Mounts Laurice 02/04/2013,11:02 AM

## 2013-02-04 NOTE — Progress Notes (Signed)
[facility] ADULT ICU REPLACEMENT PROTOCOL FOR AM LAB REPLACEMENT ONLY  The patient does not apply for the [facility] Adult ICU Electrolyte Replacment Protocol based on the criteria listed below:   1. Is GFR >/= 40 ml/min? no  Patient's GFR today is 8 2. Is urine output >/= 0.5 ml/kg/hr for the last 6 hours? no Patient's UOP is  ml/kg/hr 3. Is BUN < 60 mg/dL? no  Patient's BUN today is 126 4. Abnormal electrolyte(s): K 3.4, Mag 1.5 5. Ordered repletion with: NA 6. If a panic level lab has been reported, has the CCM MD in charge been notified? yes.   Physician:  Dr Salvadore Farber, Lorrain Rivers A 02/04/2013 5:55 AM

## 2013-02-04 NOTE — Progress Notes (Signed)
Hypomagnesemia   Mg replaced  

## 2013-02-04 NOTE — Evaluation (Signed)
Physical Therapy Evaluation Patient Details Name: Miranda Cruz MRN: [id number] DOB: [birth year] Today's Date: 02/04/2013 Time: 6387-5643 PT Time Calculation (min): 24 min  PT Assessment / Plan / Recommendation Clinical Impression  Patient is an 79 yo female admitted with acute renal failure, AMS, with hypotension.  Patient presents with general weakness impacting functional mobility. Will benefit from acute PT to maximize independence prior to discharge.  Recommend SNF at discharge for continued therapy - will need to function at supervision level to discharge home.    PT Assessment  Patient needs continued PT services    Follow Up Recommendations  SNF    Does the patient have the potential to tolerate intense rehabilitation      Barriers to Discharge        Equipment Recommendations  None recommended by PT    Recommendations for Other Services     Frequency Min 3X/week    Precautions / Restrictions Precautions Precautions: Fall Precaution Comments: recent fall 1 week pta Restrictions Weight Bearing Restrictions: No   Pertinent Vitals/Pain       Mobility  Bed Mobility Bed Mobility: Rolling Left;Left Sidelying to Sit;Sitting - Scoot to Edge of Bed Rolling Left: 4: Min assist;With rail Left Sidelying to Sit: 3: Mod assist;With rails Sitting - Scoot to Edge of Bed: 4: Min assist;With rail Details for Bed Mobility Assistance: Verbal cues for technique.  Initially patient attempted to sit straight up in bed (long sitting).  Educated patient on rolling and moving side to sit.   Transfers Transfers: Sit to Stand;Stand to Sit;Stand Pivot Transfers Sit to Stand: 1: +2 Total assist;With upper extremity assist;From bed Sit to Stand: Patient Percentage: 60% Stand to Sit: 1: +2 Total assist;With upper extremity assist;With armrests;To chair/3-in-1 Stand to Sit: Patient Percentage: 70% Stand Pivot Transfers: 1: +2 Total assist Stand Pivot Transfers: Patient Percentage: 60% Details  for Transfer Assistance: Verbal cues for hand placement and technique.  Assist to rise to standing.  Able to take several steps to pivot to chair.  Decreased balance with standing. Ambulation/Gait Ambulation/Gait Assistance: Not tested (comment)    Exercises     PT Diagnosis: Difficulty walking;Generalized weakness  PT Problem List: Decreased strength;Decreased activity tolerance;Decreased balance;Decreased mobility;Decreased knowledge of use of DME;Cardiopulmonary status limiting activity PT Treatment Interventions: DME instruction;Gait training;Functional mobility training;Therapeutic exercise;Balance training;Patient/family education   PT Goals Acute Rehab PT Goals PT Goal Formulation: With patient Time For Goal Achievement: 02/18/13 Potential to Achieve Goals: Good Pt will go Supine/Side to Sit: with supervision;with HOB 0 degrees PT Goal: Supine/Side to Sit - Progress: Goal set today Pt will go Sit to Supine/Side: with supervision;with HOB 0 degrees PT Goal: Sit to Supine/Side - Progress: Goal set today Pt will go Sit to Stand: with supervision;with upper extremity assist PT Goal: Sit to Stand - Progress: Goal set today Pt will Transfer Bed to Chair/Chair to Bed: with supervision PT Transfer Goal: Bed to Chair/Chair to Bed - Progress: Goal set today Pt will Ambulate: 51 - 150 feet;with supervision;with rolling walker PT Goal: Ambulate - Progress: Goal set today  Visit Information  Last PT Received On: 02/04/13 Assistance Needed: +2    Subjective Data  Subjective: "I didn't realize I was so sick" Patient Stated Goal: To return home   Prior Functioning  Home Living Lives With: Daughter Available Help at Discharge: Family;Available 24 hours/day Type of Home: House Home Access: Ramped entrance Home Layout: One level Bathroom Shower/Tub: Health visitor: Standard Bathroom Accessibility: Yes How Accessible: Accessible  via walker Home Adaptive Equipment:  Bedside commode/3-in-1;Walker - rolling;Straight cane;Wheelchair - manual;Grab bars in shower Prior Function Level of Independence: Independent with assistive device(s) Able to Take Stairs?: No Driving: No Vocation: Retired Musician: Surveyor, mining Arousal/Alertness: Awake/alert Behavior During Therapy: WFL for tasks assessed/performed Overall Cognitive Status: Within Functional Limits for tasks assessed    Extremity/Trunk Assessment Right Upper Extremity Assessment RUE ROM/Strength/Tone: WFL for tasks assessed RUE Sensation: WFL - Light Touch Left Upper Extremity Assessment LUE ROM/Strength/Tone: WFL for tasks assessed LUE Sensation: WFL - Light Touch Right Lower Extremity Assessment RLE ROM/Strength/Tone: Deficits RLE ROM/Strength/Tone Deficits: Strength 4/5 RLE Sensation: WFL - Light Touch Left Lower Extremity Assessment LLE ROM/Strength/Tone: Deficits LLE ROM/Strength/Tone Deficits: Strength 4/5 LLE Sensation: WFL - Light Touch   Balance Balance Balance Assessed: Yes Static Sitting Balance Static Sitting - Balance Support: No upper extremity supported;Feet supported Static Sitting - Level of Assistance: 5: Stand by assistance Static Sitting - Comment/# of Minutes: 4 Static Standing Balance Static Standing - Balance Support: Bilateral upper extremity supported Static Standing - Level of Assistance: 4: Min assist Static Standing - Comment/# of Minutes: 2 minutes.  Patient with decreased balance, leaning posteriorly.  End of Session PT - End of Session Equipment Utilized During Treatment: Gait belt;Oxygen Activity Tolerance: Patient limited by fatigue Patient left: in chair;with call bell/phone within reach Nurse Communication: Mobility status (Lift sling in chair if needed)  GP     Vena Austria 02/04/2013, 10:53 AM  Durenda Hurt. Renaldo Fiddler, [facility] Acute Rehab Services Pager [phone]

## 2013-02-04 NOTE — Progress Notes (Addendum)
[facility]  Subjective:  Awake, alert, watching Miranda Cruz movie on TV "you don't mess with Miranda Cruz!" Says she feels better   Objective: Vital signs in last 24 hours: Blood pressure 124/44, pulse 70, temperature 98 F (36.7 C), temperature source Oral, resp. rate 16, height 5\' 5"  (1.651 m), weight 83.9 kg (184 lb 15.5 oz), SpO2 97.00%.    PHYSICAL EXAM General--as above  Chest--rhonchi  Heart--no rub  Abd--nontender  Extr--2+ edema persists, bruising over L 2nd and 3rd toes  Date   Weight  12 May  80 kg (in Dr. Vicente Males office)  27 May  84.7  28 May  87.9  29 May  88.7  30 May  86.5 31 May  83.9  Lab Results:   Recent Labs Lab 02/02/13 0500 02/03/13 0415 02/04/13 0420  NA 134* 138 143  K 5.1 5.0 3.4*  CL 99 100 99  CO2 14* 18* 30  BUN 123* 128* 126*  CREATININE 5.65* 5.48* 4.50*  GLUCOSE 212* 139* 148*  CALCIUM 7.7* 8.1* 7.7*  PHOS  --  8.4* 6.5*     Recent Labs  02/02/13 0500  WBC 11.4*  HGB 11.0*  HCT 34.2*  PLT 203     I have reviewed the patient's current medications. Scheduled: . feeding supplement  1 Container Oral BID BM  . furosemide  100 mg Intravenous Q6H  . heparin subcutaneous  5,000 Units Subcutaneous Q8H  . hydrocortisone sod succinate (SOLU-CORTEF) inj  50 mg Intravenous Q12H  . insulin aspart  0-15 Units Subcutaneous TID WC  . insulin aspart  0-5 Units Subcutaneous QHS  . insulin aspart  3 Units Subcutaneous TID WC  . levothyroxine  125 mcg Oral QAC breakfast  . potassium chloride  40 mEq Oral Once  . sevelamer carbonate  800 mg Oral TID WC  . sodium bicarbonate  650 mg Oral BID  . sodium chloride  10-40 mL Intracatheter Q12H   Continuous: . sodium chloride 10 mL/hr at 02/02/13 1015    Assessment/Plan: 1. Acute worsening of renal function in patient with underlying chronic kidney disease--urine output better with diuretics.  Weight and Cr are decreasing.  K and Mg are low--they are being replaced 2. DM-2--  Per primary service 3. High BP-- lisinopril held, blood pressure better. I am puzzled by her low BP. No evidence of MI. Echo 28 May showed 45-50% EF--hypokinesis of anteroseptal wall. No tamponade on echo. Blood cultures neg so far. Cortisol was 8.5 on 27 May at 10;30 PM.  She'll need cortrosyn stim test once off hydrocortisone 4. Altered mental status--per primary service.  Better today 5. Gout-- On allopurinol-- no eosinophilia or pyuria.  uric acid 7 6. Ischemic cardiomyopathy- 2-D echo as above--last echo in Dr. Arminda Resides office January 2010 EF 40-45%)--no change, continue RX furosemide 7. Hypothyroidism--continue with thyroid replacement therapy 8. Bruising L 2nd and 3rd toes--X Ray shows no fracture     LOS: 4 days   Esau Fridman F 02/04/2013,10:55 AM   .labalb

## 2013-02-05 ENCOUNTER — Inpatient Hospital Stay (HOSPITAL_COMMUNITY): Payer: Medicare Other

## 2013-02-05 LAB — BASIC METABOLIC PANEL
BUN: 117 mg/dL — ABNORMAL HIGH (ref 6–23)
Calcium: 8.1 mg/dL — ABNORMAL LOW (ref 8.4–10.5)
GFR calc non Af Amer: 10 mL/min — ABNORMAL LOW (ref >90)
Glucose, Bld: 170 mg/dL — ABNORMAL HIGH (ref 70–99)
Sodium: 139 mEq/L (ref 135–145)

## 2013-02-05 LAB — POTASSIUM: Potassium: 3.2 mEq/L — ABNORMAL LOW (ref 3.5–5.1)

## 2013-02-05 LAB — CBC WITH DIFFERENTIAL/PLATELET
Basophils Relative: 0 % (ref 0–1)
Eosinophils Absolute: 0 10*3/uL (ref 0.0–0.7)
MCH: 29 pg (ref 26.0–34.0)
MCHC: 32.8 g/dL (ref 30.0–36.0)
Neutrophils Relative %: 86 % — ABNORMAL HIGH (ref 43–77)
Platelets: 170 10*3/uL (ref 150–400)
RDW: 14.7 % (ref 11.5–15.5)

## 2013-02-05 LAB — GLUCOSE, CAPILLARY
Glucose-Capillary: 160 mg/dL — ABNORMAL HIGH (ref 70–99)
Glucose-Capillary: 185 mg/dL — ABNORMAL HIGH (ref 70–99)
Glucose-Capillary: 167 mg/dL — ABNORMAL HIGH (ref 70–99)

## 2013-02-05 LAB — CBC
Hemoglobin: 10 g/dL — ABNORMAL LOW (ref 12.0–15.0)
MCH: 28.8 pg (ref 26.0–34.0)
MCHC: 33 g/dL (ref 30.0–36.0)

## 2013-02-05 LAB — PHOSPHORUS: Phosphorus: 4.7 mg/dL — ABNORMAL HIGH (ref 2.3–4.6)

## 2013-02-05 MED ORDER — FUROSEMIDE 80 MG PO TABS
80.0000 mg | ORAL_TABLET | Freq: Two times a day (BID) | ORAL | Status: DC
  Administered 2013-02-05 – 2013-02-11 (×13): 80 mg via ORAL
  Filled 2013-02-05 (×16): qty 1

## 2013-02-05 MED ORDER — POTASSIUM CHLORIDE CRYS ER 20 MEQ PO TBCR
40.0000 meq | EXTENDED_RELEASE_TABLET | Freq: Once | ORAL | Status: AC
  Administered 2013-02-06: 40 meq via ORAL

## 2013-02-05 MED ORDER — POTASSIUM CHLORIDE CRYS ER 20 MEQ PO TBCR: 40.0000 meq | EXTENDED_RELEASE_TABLET | Freq: Two times a day (BID) | ORAL | Status: DC

## 2013-02-05 MED ORDER — HYDROCORTISONE SOD SUCCINATE 100 MG IJ SOLR
50.0000 mg | Freq: Every day | INTRAMUSCULAR | Status: DC
  Administered 2013-02-06 – 2013-02-07 (×2): 50 mg via INTRAVENOUS
  Filled 2013-02-05 (×2): qty 1

## 2013-02-05 MED ORDER — POTASSIUM CHLORIDE 10 MEQ/100ML IV SOLN
10.0000 meq | INTRAVENOUS | Status: AC
  Administered 2013-02-05 (×2): 10 meq via INTRAVENOUS
  Filled 2013-02-05 (×2): qty 100

## 2013-02-05 MED ORDER — POTASSIUM CHLORIDE CRYS ER 20 MEQ PO TBCR
40.0000 meq | EXTENDED_RELEASE_TABLET | Freq: Two times a day (BID) | ORAL | Status: DC
  Administered 2013-02-05: 40 meq via ORAL
  Filled 2013-02-05: qty 2

## 2013-02-05 MED ORDER — SENNOSIDES-DOCUSATE SODIUM 8.6-50 MG PO TABS
2.0000 | ORAL_TABLET | Freq: Two times a day (BID) | ORAL | Status: DC
  Administered 2013-02-05 – 2013-02-10 (×9): 2 via ORAL
  Filled 2013-02-05 (×15): qty 2

## 2013-02-05 NOTE — Progress Notes (Signed)
[facility]  Subjective:  Awake, alert, watching Miranda Cruz movie   Objective: Vital signs in last 24 hours: Blood pressure 134/82, pulse 70, temperature 97.5 F (36.4 C), temperature source Oral, resp. rate 19, height 5\' 5"  (1.651 m), weight 87.7 kg (193 lb 5.5 oz), SpO2 96.00%.    PHYSICAL EXAM General--as abvoe Chest--decreased BS in bases Heart--no rub Abd--nontender Extr--2+ edema persists, bruising over L 2nd and 3rd toes  Date Weight  12 May  80 kg (in Dr. Vicente Males office)  27 May  84.7  28 May  87.9  29 May  88.7  30 May  86.5  31 May  83.9   1 Jun   87.7 kg (?)  I/O yesterday 120/1975, today 1500 cc out so far  Lab Results:   Recent Labs Lab 02/03/13 0415 02/04/13 0420 02/05/13 0605  NA 138 143 139  K 5.0 3.4* 2.7*  CL 100 99 95*  CO2 18* 30 31  BUN 128* 126* 117*  CREATININE 5.48* 4.50* 3.75*  GLUCOSE 139* 148* 170*  CALCIUM 8.1* 7.7* 8.1*  PHOS 8.4* 6.5* 4.7*     Recent Labs  02/05/13 0605  WBC 6.9  HGB 10.0*  HCT 30.3*  PLT 175     I have reviewed the patient's current medications. Scheduled: . feeding supplement  1 Container Oral BID BM  . furosemide  80 mg Oral BID  . heparin subcutaneous  5,000 Units Subcutaneous Q8H  . hydrocortisone sod succinate (SOLU-CORTEF) inj  50 mg Intravenous Q12H  . insulin aspart  0-15 Units Subcutaneous TID WC  . insulin aspart  0-5 Units Subcutaneous QHS  . insulin aspart  3 Units Subcutaneous TID WC  . levothyroxine  125 mcg Oral QAC breakfast  . potassium chloride  40 mEq Oral Once  . potassium chloride  40 mEq Oral BID  . sevelamer carbonate  800 mg Oral TID WC  . sodium bicarbonate  650 mg Oral BID  . sodium chloride  10-40 mL Intracatheter Q12H      Assessment/Plan: 1. Acute worsening of renal function in patient with underlying chronic kidney disease--urine output better with diuretics. Weight and Cr are decreasing (I don't believe today's weight). K and Mg are low--they  are being replaced.  D/c po bicarb.  Change furosemide from IV to 80 po BID 2. DM-2-- Per primary service 3. High BP-- lisinopril held, blood pressure better. I am puzzled by her low BP of a few days ago. No evidence of MI. Echo 28 May showed 45-50% EF--hypokinesis of anteroseptal wall. No tamponade on echo. Blood cultures neg so far. Cortisol was 8.5 on 27 May at 10;30 PM. She'll need cortrosyn stim test once off hydrocortisone 4. Altered mental status--per primary service. Much better today 5. Gout-- was on allopurinol-- no eosinophilia or pyuria. uric acid 7.  Allopurinol was d/c'd on 28 May--not sure why.  Check uric acid in AM 6. Ischemic cardiomyopathy- 2-D echo as above--last echo PTA was in Dr. Arminda Resides office January 2010 EF 40-45%)--no change, continue RX furosemide 7. Hypothyroidism--continue with thyroid replacement therapy 8. Bruising L 2nd and 3rd toes--X Ray shows no fracture     LOS: 5 days   Miranda Cruz F 02/05/2013,9:37 AM   .labalb

## 2013-02-05 NOTE — Progress Notes (Signed)
CRITICAL VALUE ALERT  Critical value received:  k 2.7  Date of notification:  02/05/2013  Time of notification:  0720  Critical value read back:yes  Nurse who received alert:  Jeremy Johann, RN  MD notified (1st page):  Dr. Blake Divine  Time of first page:  [phone]  MD notified (2nd page):  Time of second page:  Responding MD:     Time MD responded:

## 2013-02-05 NOTE — Progress Notes (Signed)
[facility] PROGRESS NOTE  Miranda Cruz RUE:[id number] DOB: [birth year] DOA: 01/31/2013 PCP: Hoyle Sauer, MD Brief HPI:  79 yo with past medical history of CAD, ischemic cardiomyopathy (EF 40%) and CKD (Cr 2.5) admitted 5/27 after found her Cr to be elevated to 6.7 and K elevated to 6.5. Initially DNR / refused dialysis, but code status overturned by family. PCCM was consulted. She underwent US RENAL did not show evidence of hydronephrosis, she waa also hypotensive an d was started on pressors on admission, they were discontinued on 5/30 after BP parameters normalized. She is deemed stable and transferred to telemetry to medical service on 6/1. Her renal parameters are improving and Dr Caryn Section on board for renal .     Assessment/Plan: Acute on chronic renal failure -  Improving. now nonoliguric on furosemide Hyperkalemia, Resolved.  Hypervolemic hyponatremia, resolved   Hypotension: unclear etiology. Probably from ischemic cardiomyopathy. She is weaned off pressors and started on hydrocortisone. Will wean her off the hydrocortisonand will need cosyntropin stimulation test to evaluate adrenal insufficiency.  Acute encephalopathy, probably metabolic : resolved.   Diabetes Mellitus:  CBG (last 3)   Recent Labs  02/05/13 0604 02/05/13 1112 02/05/13 1555  GLUCAP 160* 185* 142*    Resume SSI.   Hypothyroidism: resume synthroid.   DVT prophylaxis  Code Status: fullcode Family Communication: none at bedside. Disposition Plan: pending PT EVAL   Consultants:  PCCM Signed off on 5/31  Renal consult.  Procedures:  none  Antibiotics:  none  HPI/Subjective: Comfortable no new complaints.   Objective: Filed Vitals:   02/04/13 1949 02/05/13 0425 02/05/13 0700 02/05/13 1349  BP: 111/44 134/82  142/57  Pulse: 70 70  70  Temp: 98.1 F (36.7 C) 97.5 F (36.4 C)  97.7 F (36.5 C)  TempSrc: Oral Oral  Oral  Resp: 18 19  18   Height:      Weight:   87.7 kg (193 lb 5.5  oz)   SpO2: 95% 96%  97%    Intake/Output Summary (Last 24 hours) at 02/05/13 1704 Last data filed at 02/05/13 0836  Gross per 24 hour  Intake      0 ml  Output   2450 ml  Net  -2450 ml   Filed Weights   02/03/13 0437 02/04/13 0500 02/05/13 0700  Weight: 86.5 kg (190 lb 11.2 oz) 83.9 kg (184 lb 15.5 oz) 87.7 kg (193 lb 5.5 oz)    Exam:   General:  Hard of hearing, but pleasant and comfortable.  Cardiovascular: s1s2  Respiratory: CTAB  Abdomen: SOFT NT ND BS+  Musculoskeletal: pedal edema present.   Data Reviewed: Basic Metabolic Panel:  Recent Labs Lab 02/01/13 1945 02/02/13 0500 02/03/13 0415 02/04/13 0420 02/05/13 0605  NA 139 134* 138 143 139  K 4.8 5.1 5.0 3.4* 2.7*  CL 104 99 100 99 95*  CO2 17* 14* 18* 30 31  GLUCOSE 154* 212* 139* 148* 170*  BUN 127* 123* 128* 126* 117*  CREATININE 5.57* 5.65* 5.48* 4.50* 3.75*  CALCIUM 7.4* 7.7* 8.1* 7.7* 8.1*  MG  --  1.3* 1.2* 1.5 2.2  PHOS  --   --  8.4* 6.5* 4.7*   Liver Function Tests:  Recent Labs Lab 01/31/13 1503 02/03/13 0415 02/04/13 0420  AST 40*  --   --   ALT 16  --   --   ALKPHOS 179*  --   --   BILITOT 0.4  --   --   PROT 6.6  --   --  ALBUMIN 2.9* 2.4* 2.2*   No results found for this basename: LIPASE, AMYLASE,  in the last 168 hours No results found for this basename: AMMONIA,  in the last 168 hours CBC:  Recent Labs Lab 01/31/13 2200 02/01/13 0411 02/02/13 0500 02/05/13 0605 02/05/13 1235  WBC 6.1 12.5* 11.4* 6.9 8.2  NEUTROABS  --   --  8.7*  --  7.0  HGB 9.6* 11.6* 11.0* 10.0* 10.6*  HCT 29.6* 36.2 34.2* 30.3* 32.3*  MCV 90.0 91.0 90.0 87.3 88.5  PLT 124* 191 203 175 170   Cardiac Enzymes:  Recent Labs Lab 01/31/13 1503 01/31/13 2048  CKTOTAL 120 82  CKMB  --  7.0*  TROPONINI  --  <0.30   BNP (last 3 results) No results found for this basename: PROBNP,  in the last 8760 hours CBG:  Recent Labs Lab 02/04/13 1727 02/04/13 2106 02/05/13 0604 02/05/13 1112  02/05/13 1555  GLUCAP 154* 235* 160* 185* 142*    Recent Results (from the past 240 hour(s))  MRSA PCR SCREENING     Status: None   Collection Time    01/31/13  2:01 PM      Result Value Range Status   MRSA by PCR NEGATIVE  NEGATIVE Final   Comment:            The GeneXpert MRSA Assay (FDA     approved for NASAL specimens     only), is one component of a     comprehensive MRSA colonization     surveillance program. It is not     intended to diagnose MRSA     infection nor to guide or     monitor treatment for     MRSA infections.  URINE CULTURE     Status: None   Collection Time    01/31/13  4:58 PM      Result Value Range Status   Specimen Description URINE, RANDOM   Final   Special Requests NONE   Final   Culture  Setup Time 01/31/2013 18:45   Final   Colony Count NO GROWTH   Final   Culture NO GROWTH   Final   Report Status 02/01/2013 FINAL   Final  CULTURE, BLOOD (ROUTINE X 2)     Status: None   Collection Time    01/31/13 10:14 PM      Result Value Range Status   Specimen Description BLOOD A-LINE   Final   Special Requests BOTTLES DRAWN AEROBIC ONLY 10CC   Final   Culture  Setup Time 02/01/2013 05:07   Final   Culture     Final   Value:        BLOOD CULTURE RECEIVED NO GROWTH TO DATE CULTURE WILL BE HELD FOR 5 DAYS BEFORE ISSUING A FINAL NEGATIVE REPORT   Report Status PENDING   Incomplete  CULTURE, BLOOD (ROUTINE X 2)     Status: None   Collection Time    02/01/13  1:20 AM      Result Value Range Status   Specimen Description BLOOD LEFT HAND   Final   Special Requests BOTTLES DRAWN AEROBIC ONLY 10CC   Final   Culture  Setup Time 02/01/2013 05:07   Final   Culture     Final   Value:        BLOOD CULTURE RECEIVED NO GROWTH TO DATE CULTURE WILL BE HELD FOR 5 DAYS BEFORE ISSUING A FINAL NEGATIVE REPORT   Report Status PENDING  Incomplete     Studies: Dg Chest Port 1 View  02/05/2013   *RADIOLOGY REPORT*  Clinical Data: Edema  PORTABLE CHEST - 1 VIEW   Comparison: 02/03/2013  Findings: Right arm PICC and left subclavian pacemaker are stable in position.  Mild cardiomegaly stable.  Atheromatous aorta. Patchy   left infrahilar airspace disease is somewhat more conspicuous.  Question tiny left pleural effusion as before.  IMPRESSION:  1.  Developing left infrahilar airspace disease.   Original Report Authenticated By: D. Andria Rhein, MD    Scheduled Meds: . feeding supplement  1 Container Oral BID BM  . furosemide  80 mg Oral BID  . heparin subcutaneous  5,000 Units Subcutaneous Q8H  . hydrocortisone sod succinate (SOLU-CORTEF) inj  50 mg Intravenous Q12H  . insulin aspart  0-15 Units Subcutaneous TID WC  . insulin aspart  0-5 Units Subcutaneous QHS  . insulin aspart  3 Units Subcutaneous TID WC  . levothyroxine  125 mcg Oral QAC breakfast  . potassium chloride  40 mEq Oral Once  . potassium chloride  40 mEq Oral BID  . senna-docusate  2 tablet Oral BID  . sevelamer carbonate  800 mg Oral TID WC  . sodium chloride  10-40 mL Intracatheter Q12H   Continuous Infusions: . sodium chloride 10 mL/hr at 02/04/13 0700    Active Problems:   ARF (acute renal failure)   CKD (chronic kidney disease) stage 4, GFR 15-29 ml/min   Metabolic acidosis   DM (diabetes mellitus)   Volume overload   Cardiomyopathy, ischemic   CAD (coronary artery disease)   Hypotension   Encephalopathy acute   Hyperkalemia    Time spent: 25 minutes.     [facility]  [facility] Pager [phone] If 7PM-7AM, please contact night-coverage at [email or web address], password [facility] 02/05/2013, 5:04 PM  LOS: 5 days

## 2013-02-06 LAB — RENAL FUNCTION PANEL
BUN: 110 mg/dL — ABNORMAL HIGH (ref 6–23)
CO2: 34 mEq/L — ABNORMAL HIGH (ref 19–32)
Chloride: 94 mEq/L — ABNORMAL LOW (ref 96–112)
Creatinine, Ser: 3.09 mg/dL — ABNORMAL HIGH (ref 0.50–1.10)
GFR calc non Af Amer: 13 mL/min — ABNORMAL LOW (ref >90)

## 2013-02-06 LAB — GLUCOSE, CAPILLARY: Glucose-Capillary: 132 mg/dL — ABNORMAL HIGH (ref 70–99)

## 2013-02-06 MED ORDER — LORAZEPAM 0.5 MG PO TABS: 0.5000 mg | ORAL_TABLET | Freq: Once | ORAL | Status: DC

## 2013-02-06 MED ORDER — POTASSIUM CHLORIDE CRYS ER 20 MEQ PO TBCR
40.0000 meq | EXTENDED_RELEASE_TABLET | Freq: Once | ORAL | Status: AC
  Administered 2013-02-06: 40 meq via ORAL

## 2013-02-06 MED ORDER — LEVOFLOXACIN IN D5W 750 MG/150ML IV SOLN
750.0000 mg | INTRAVENOUS | Status: DC
  Administered 2013-02-06 – 2013-02-10 (×3): 750 mg via INTRAVENOUS
  Filled 2013-02-06 (×3): qty 150

## 2013-02-06 NOTE — Progress Notes (Signed)
Patient ID: Clovis Pu, female   DOB: 1929/10/17, 79 y.o.   MRN: [id number] S:no new complaints O:BP 124/81  Pulse 75  Temp(Src) 97.7 F (36.5 C) (Oral)  Resp 17  Ht 5\' 5"  (1.651 m)  Wt 85.5 kg (188 lb 7.9 oz)  BMI 31.37 kg/m2  SpO2 97%  Intake/Output Summary (Last 24 hours) at 02/06/13 1040 Last data filed at 02/06/13 0600  Gross per 24 hour  Intake      0 ml  Output   1001 ml  Net  -1001 ml   Intake/Output: I/O last 3 completed shifts: In: -  Out: 3451 [Urine:3450; Stool:1]  Intake/Output this shift:    Weight change: -0.9 kg (-1 lb 15.7 oz) Gen:WD elderly WF in NAD CVS:no rub Resp:cta DGU:YQIHKV Ext:no edema   Recent Labs Lab 01/31/13 1503  02/01/13 1300 02/01/13 1945 02/02/13 0500 02/03/13 0415 02/04/13 0420 02/05/13 0605 02/05/13 2035 02/06/13 0525  NA 132*  < > 137 139 134* 138 143 139  --  142  K 6.9*  < > 5.5* 4.8 5.1 5.0 3.4* 2.7* 3.2* 3.4*  CL 97  < > 102 104 99 100 99 95*  --  94*  CO2 15*  < > 13* 17* 14* 18* 30 31  --  34*  GLUCOSE 134*  < > 125* 154* 212* 139* 148* 170*  --  144*  BUN 139*  < > 130* 127* 123* 128* 126* 117*  --  110*  CREATININE 6.63*  < > 6.08* 5.57* 5.65* 5.48* 4.50* 3.75*  --  3.09*  ALBUMIN 2.9*  --   --   --   --  2.4* 2.2*  --   --  2.5*  CALCIUM 8.0*  < > 7.8* 7.4* 7.7* 8.1* 7.7* 8.1*  --  8.4  PHOS  --   --   --   --   --  8.4* 6.5* 4.7*  --  3.6  AST 40*  --   --   --   --   --   --   --   --   --   ALT 16  --   --   --   --   --   --   --   --   --   < > = values in this interval not displayed. Liver Function Tests:  Recent Labs Lab 01/31/13 1503 02/03/13 0415 02/04/13 0420 02/06/13 0525  AST 40*  --   --   --   ALT 16  --   --   --   ALKPHOS 179*  --   --   --   BILITOT 0.4  --   --   --   PROT 6.6  --   --   --   ALBUMIN 2.9* 2.4* 2.2* 2.5*   No results found for this basename: LIPASE, AMYLASE,  in the last 168 hours No results found for this basename: AMMONIA,  in the last 168 hours CBC:  Recent  Labs Lab 01/31/13 2200 02/01/13 0411 02/02/13 0500 02/05/13 0605 02/05/13 1235  WBC 6.1 12.5* 11.4* 6.9 8.2  NEUTROABS  --   --  8.7*  --  7.0  HGB 9.6* 11.6* 11.0* 10.0* 10.6*  HCT 29.6* 36.2 34.2* 30.3* 32.3*  MCV 90.0 91.0 90.0 87.3 88.5  PLT 124* 191 203 175 170   Cardiac Enzymes:  Recent Labs Lab 01/31/13 1503 01/31/13 2048  CKTOTAL 120 82  CKMB  --  7.0*  TROPONINI  --  <0.30   CBG:  Recent Labs Lab 02/05/13 0604 02/05/13 1112 02/05/13 1555 02/05/13 2059 02/06/13 0555  GLUCAP 160* 185* 142* 167* 132*    Iron Studies: No results found for this basename: IRON, TIBC, TRANSFERRIN, FERRITIN,  in the last 72 hours Studies/Results: Dg Chest Port 1 View  02/05/2013   *RADIOLOGY REPORT*  Clinical Data: Edema  PORTABLE CHEST - 1 VIEW  Comparison: 02/03/2013  Findings: Right arm PICC and left subclavian pacemaker are stable in position.  Mild cardiomegaly stable.  Atheromatous aorta. Patchy   left infrahilar airspace disease is somewhat more conspicuous.  Question tiny left pleural effusion as before.  IMPRESSION:  1.  Developing left infrahilar airspace disease.   Original Report Authenticated By: D. Hassell III, MD   . feeding supplement  1 Container Oral BID BM  . furosemide  80 mg Oral BID  . heparin subcutaneous  5,000 Units Subcutaneous Q8H  . hydrocortisone sod succinate (SOLU-CORTEF) inj  50 mg Intravenous Daily  . insulin aspart  0-15 Units Subcutaneous TID WC  . insulin aspart  0-5 Units Subcutaneous QHS  . insulin aspart  3 Units Subcutaneous TID WC  . levothyroxine  125 mcg Oral QAC breakfast  . potassium chloride  40 mEq Oral Once  . senna-docusate  2 tablet Oral BID  . sevelamer carbonate  800 mg Oral TID WC  . sodium chloride  10-40 mL Intracatheter Q12H    BMET    Component Value Date/Time   NA 142 02/06/2013 0525   K 3.4* 02/06/2013 0525   CL 94* 02/06/2013 0525   CO2 34* 02/06/2013 0525   GLUCOSE 144* 02/06/2013 0525   BUN 110* 02/06/2013 0525    CREATININE 3.09* 02/06/2013 0525   CALCIUM 8.4 02/06/2013 0525   GFRNONAA 13* 02/06/2013 0525   GFRAA 15* 02/06/2013 0525   CBC    Component Value Date/Time   WBC 8.2 02/05/2013 1235   RBC 3.65* 02/05/2013 1235   HGB 10.6* 02/05/2013 1235   HCT 32.3* 02/05/2013 1235   PLT 170 02/05/2013 1235   MCV 88.5 02/05/2013 1235   MCH 29.0 02/05/2013 1235   MCHC 32.8 02/05/2013 1235   RDW 14.7 02/05/2013 1235   LYMPHSABS 0.8 02/05/2013 1235   MONOABS 0.4 02/05/2013 1235   EOSABS 0.0 02/05/2013 1235   BASOSABS 0.0 02/05/2013 1235     Assessment/Plan:  1. AKI/CKD- marked improvement.  Scr cont to trend downward 2. AMS-improved 3. Acute on chronic CHF- improved volume status. Now on po lasix. 4. DM-per primary svc. 5. HTN- cont to hold lisinopril 6. Gout- allopurinol stopped 02/01/13 7. ICMP- last EF 45-50% with hypokinesis of anteroseptal wall. 8. Hypothyroidism 9. F/E/N- repleted K and Mg. Follow levels 10. ACDz- follow 11. SHPTH- on renvela 12. Dispo- per primary svc.     Ameera Tigue A

## 2013-02-06 NOTE — Care Management Note (Unsigned)
    Page 1 of 2   02/07/2013     4:12:39 PM   CARE MANAGEMENT NOTE 02/07/2013  Patient:  Miranda Cruz, Miranda Cruz   Account Number:  [id number]  Date Initiated:  01/31/2013  Documentation initiated by:  Junius Creamer  Subjective/Objective Assessment:   adm w hyperkalemia     Action/Plan:   lives w daughter   Anticipated DC Date:  02/09/2013   Anticipated DC Plan:  SKILLED NURSING FACILITY  In-house referral  Clinical Social Worker      DC Associate Professor  CM consult      [facility] Choice  HOME HEALTH   Choice offered to / List presented to:  C-1 Patient        HH arranged  HH-1 RN  HH-2 PT  HH-3 OT      [facility] agency  [facility]   Status of service:  In process, will continue to follow Medicare Important Message given?   (If response is "NO", the following Medicare IM given date fields will be blank) Date Medicare IM given:   Date Additional Medicare IM given:    Discharge Disposition:  HOME W HOME HEALTH SERVICES  Per UR Regulation:  Reviewed for med. necessity/level of care/duration of stay  If discussed at Long Length of Stay Meetings, dates discussed:    Comments:  02/07/13 Joselin Crandell,RN,BSN [phone] PT FOR POSSIBLE DC TOMORROW.  PT DECLINES SNF FOR REHAB, STATES HER FAMILY WILL ASSIST AT DC.  REFERRAL TO [facility], PER CHOICE. START OF CARE 24-48 POST DC DATE.  02/06/13 Ader Fritze,RN,BSN [phone] P.T. CONSULT DONE TODAY; RECOMMENDATION IS FOR SNF PLACEMENT.  PT LIVES WITH DAUGHTER WHO WORKS, AND STATES GRANDDAUGHTER CAN STAY WITH HER THIS SUMMER WHILE SHE IS WORKING.  SHE DESCRIBES HER AS "VERY TINY".  SHE HAS USED HH IN THE PAST WITH [facility].  WILL CONSULT CSW TO DISCUSS OPTION OF SNF FOR REHAB WITH PT, AS UNCERTAIN IF GRANDDAUGHTER WILL BE ADEQUATE SUPPORT AT HOME.  WILL FOLLOW.

## 2013-02-06 NOTE — Progress Notes (Signed)
Physical Therapy Treatment Patient Details Name: Miranda Cruz MRN: [id number] DOB: Nov 10, 1929 Today's Date: 02/06/2013 Time: 4098-1191 PT Time Calculation (min): 31 min  PT Assessment / Plan / Recommendation Comments on Treatment Session  Pt s/p ARF, hypotensive, and AMS.  Will benefit from PT to address endurance and strrength.  Improving mobility.  Will need therapy at SNF to return to prior functional level.      Follow Up Recommendations  SNF;Supervision - Intermittent                 Equipment Recommendations  None recommended by PT        Frequency Min 3X/week   Plan Discharge plan remains appropriate;Frequency remains appropriate    Precautions / Restrictions Precautions Precautions: Fall Precaution Comments: recent fall 1 week pta Restrictions Weight Bearing Restrictions: No   Pertinent Vitals/Pain VSS, no pain    Mobility  Bed Mobility Bed Mobility: Supine to Sit Rolling Left: 4: Min guard Left Sidelying to Sit: With rails;4: Min guard Sitting - Scoot to Edge of Bed: With rail;4: Min guard Details for Bed Mobility Assistance: Verbal cues for technique.  Pt rolled and then and moved side to sit.   Transfers Transfers: Sit to Stand;Stand to [employer] Transfers Sit to Stand: 4: Min guard;With upper extremity assist;From toilet Stand to Sit: 4: Min guard;Without upper extremity assist;To chair/3-in-1 Stand Pivot Transfers: Not tested (comment) Details for Transfer Assistance: Verbal cues for hand placement and technique.  Improved balance with standing. Ambulation/Gait Ambulation/Gait Assistance: 4: Min guard Ambulation Distance (Feet): 165 Feet Assistive device: Rolling walker Ambulation/Gait Assistance Details: Pt ambulated with RW with good and safe technique.   Gait Pattern: Step-through pattern;Decreased stride length;Trunk flexed;Wide base of support Gait velocity: decreased Stairs: No Wheelchair Mobility Wheelchair Mobility: No     PT  Goals Acute Rehab PT Goals PT Goal Formulation: With patient Time For Goal Achievement: 02/18/13 Potential to Achieve Goals: Good Pt will go Supine/Side to Sit: with supervision;with HOB 0 degrees PT Goal: Supine/Side to Sit - Progress: Progressing toward goal Pt will go Sit to Supine/Side: with supervision;with HOB 0 degrees PT Goal: Sit to Supine/Side - Progress: Progressing toward goal Pt will go Sit to Stand: with supervision;with upper extremity assist PT Goal: Sit to Stand - Progress: Progressing toward goal Pt will Transfer Bed to Chair/Chair to Bed: with supervision PT Transfer Goal: Bed to Chair/Chair to Bed - Progress: Progressing toward goal Pt will Ambulate: 51 - 150 feet;with supervision;with rolling walker PT Goal: Ambulate - Progress: Progressing toward goal  Visit Information  Last PT Received On: 02/06/13 Assistance Needed: +1 PT/OT Co-Evaluation/Treatment: Yes    Subjective Data  Subjective: "I didn't know I would gain that weight from fluid that quick.": Patient Stated Goal: To return home   Cognition  Cognition Arousal/Alertness: Awake/alert Behavior During Therapy: WFL for tasks assessed/performed Overall Cognitive Status: Within Functional Limits for tasks assessed    Balance  Balance Balance Assessed: Yes Static Sitting Balance Static Sitting - Balance Support: No upper extremity supported Static Sitting - Level of Assistance: 5: Stand by assistance Static Sitting - Comment/# of Minutes: 5 Static Standing Balance Static Standing - Balance Support: No upper extremity supported Static Standing - Level of Assistance: 5: Stand by assistance Static Standing - Comment/# of Minutes: Pt able to stand for at least 10 mins during grooming and bathing tasks.  One LOB which she was able to selfcorrect using the sink for support.  End of Session PT - End  of Session Equipment Utilized During Treatment: Gait belt Activity Tolerance: Patient limited by  fatigue Patient left: in chair;with call bell/phone within reach Nurse Communication: Mobility status       INGOLD,Eriyanna Kofoed 02/06/2013, 10:36 AM [facility] [phone] (pager)

## 2013-02-06 NOTE — Evaluation (Signed)
Occupational Therapy Evaluation Patient Details Name: Miranda Cruz MRN: [id number] DOB: 06-18-1930 Today's Date: 02/06/2013 Time: 9147-8295 OT Time Calculation (min): 35 min  OT Assessment / Plan / Recommendation Clinical Impression  Pt is a pleasant 79 yr old female admitted for ARF and history of fall approximately 1 week before admission.  Pt currently is min guard assist for selfcare related transfers and supervision to min assist for bathing and dressing tasks.  Feel pt will benefit from acute care OT to help increase overall independence in order to return home with her daughter and grandaughter and 24 hour supervision.      OT Assessment  Patient needs continued OT Services    Follow Up Recommendations  No OT follow up    Barriers to Discharge None    Equipment Recommendations  Tub/shower seat       Frequency  Min 2X/week    Precautions / Restrictions Precautions Precautions: Fall Precaution Comments: recent fall 1 week pta Restrictions Weight Bearing Restrictions: No   Pertinent Vitals/Pain Vitals stable, O2 sats 97% on room air, HR approximately 70-80 BPM    ADL  Eating/Feeding: Simulated Where Assessed - Eating/Feeding: Chair Grooming: Performed;Min guard;Wash/dry hands;Wash/dry face;Teeth care Where Assessed - Grooming: Unsupported standing Upper Body Bathing: Performed;Min guard Where Assessed - Upper Body Bathing: Unsupported standing Lower Body Bathing: Performed;Minimal assistance Where Assessed - Lower Body Bathing: Unsupported sit to stand Upper Body Dressing: Simulated;Set up Where Assessed - Upper Body Dressing: Unsupported sitting Lower Body Dressing: Performed;Other (comment);Maximal assistance (gripper socks only) Where Assessed - Lower Body Dressing: Supported sit to stand Toilet Transfer: Performed;Min Pension scheme manager Method: Other (comment) (ambulate with RW to handicapped toilet) Acupuncturist: Comfort height toilet;Grab  bars Toileting - Clothing Manipulation and Hygiene: Performed;Min guard Where Assessed - Engineer, mining and Hygiene: Sit to stand from 3-in-1 or toilet Tub/Shower Transfer Method: Not assessed Equipment Used: Rolling walker;Gait belt Transfers/Ambulation Related to ADLs: Pt able to peform all transfers and mobility with use of the RW and min guard assist. ADL Comments: Pt with history of bilateral THR.  Reports using the sockaide at home for LB dressing secondary to not being able to reach down to her feet.  Overall min guard assist for mobility and selfcare at this time.  Will have initial 24 hour sueprvision at discharge.  May benefit from a shower seat for use in the walk-in shower.    OT Diagnosis: Generalized weakness  OT Problem List: Impaired balance (sitting and/or standing);Decreased knowledge of use of DME or AE OT Treatment Interventions: Self-care/ADL training;Therapeutic activities;Patient/family education;DME and/or AE instruction;Balance training   OT Goals Acute Rehab OT Goals OT Goal Formulation: With patient Time For Goal Achievement: 02/20/13 Potential to Achieve Goals: Good ADL Goals Pt Will Perform Grooming: with modified independence;Supported;Standing at sink ADL Goal: Grooming - Progress: Goal set today Pt Will Perform Lower Body Bathing: with supervision;Sit to stand from bed;with adaptive equipment ADL Goal: Lower Body Bathing - Progress: Goal set today Pt Will Perform Lower Body Dressing: with modified independence;Sit to stand from bed;Supported;with adaptive equipment ADL Goal: Lower Body Dressing - Progress: Goal set today Pt Will Transfer to Toilet: with modified independence;Ambulation;with DME;3-in-1 ADL Goal: Toilet Transfer - Progress: Goal set today Pt Will Perform Tub/Shower Transfer: Shower transfer;Ambulation;with DME;Stand pivot transfer ADL Goal: Tub/Shower Transfer - Progress: Goal set today  Visit Information  Last OT Received  On: 02/06/13 Assistance Needed: +1 PT/OT Co-Evaluation/Treatment: Yes    Subjective Data  Subjective: My grandaughter  will be out of school and with me while my daughter is at work. Patient Stated Goal: Pt did not state.   Prior Functioning     Home Living Lives With: Daughter Available Help at Discharge: Family;Available 24 hours/day Type of Home: House Home Access: Ramped entrance Home Layout: One level Bathroom Shower/Tub: Health visitor: Standard Bathroom Accessibility: Yes How Accessible: Accessible via walker Home Adaptive Equipment: Bedside commode/3-in-1;Walker - rolling;Straight cane;Wheelchair - manual;Grab bars in shower;Reacher;Sock aid Prior Function Level of Independence: Independent with assistive device(s) Able to Take Stairs?: No Driving: No Vocation: Retired Musician: HOH         Vision/Perception Vision - History Baseline Vision: No visual deficits Patient Visual Report: No change from baseline Vision - Assessment Eye Alignment: Within Functional Limits Vision Assessment: Vision not tested Perception Perception: Within Functional Limits Praxis Praxis: Intact   Cognition  Cognition Arousal/Alertness: Awake/alert Behavior During Therapy: WFL for tasks assessed/performed Overall Cognitive Status: Within Functional Limits for tasks assessed    Extremity/Trunk Assessment Right Upper Extremity Assessment RUE ROM/Strength/Tone: WFL for tasks assessed RUE Sensation: WFL - Light Touch RUE Coordination: WFL - gross/fine motor Left Upper Extremity Assessment LUE ROM/Strength/Tone: WFL for tasks assessed LUE Sensation: WFL - Light Touch LUE Coordination: WFL - gross/fine motor Trunk Assessment Trunk Assessment: Normal     Mobility Bed Mobility Bed Mobility: Supine to Sit Rolling Left: 4: Min guard Transfers Transfers: Sit to Stand Sit to Stand: 4: Min guard;With upper extremity assist;From toilet Stand to  Sit: 4: Min guard;Without upper extremity assist;To chair/3-in-1        Balance Static Sitting Balance Static Sitting - Balance Support: No upper extremity supported Static Sitting - Level of Assistance: 5: Stand by assistance Static Standing Balance Static Standing - Balance Support: No upper extremity supported Static Standing - Level of Assistance: 5: Stand by assistance Static Standing - Comment/# of Minutes: Pt able to stand for at least 10 mins during grooming and bathing tasks.  One LOB which she was able to selfcorrect using the sink for support.   End of Session OT - End of Session Equipment Utilized During Treatment: Gait belt Activity Tolerance: Patient tolerated treatment well Patient left: in chair;with call bell/phone within reach Nurse Communication: Mobility status     Eriverto Byrnes OTR/L Pager number [phone] 02/06/2013, 10:28 AM

## 2013-02-06 NOTE — Clinical Documentation Improvement (Signed)
SHOCK DOCUMENTATION CLARIFICATION QUERY THIS DOCUMENT IS NOT A PERMANENT PART OF THE MEDICAL RECORD Please update your documentation within the medical record to reflect your response to this query.                                                                                        02/06/13  Dear Provider  In a better effort to capture your patient's severity of illness, reflect appropriate length of stay and utilization of resources, a review of the patient medical record has revealed the following indicators.  Based on your clinical judgment, please clarify and document in a progress note and/or discharge summary the clinical condition associated with the following supporting information:In responding to this query please exercise your independent judgment.  The fact that a query is asked, does not imply that any particular answer is desired or expected.  Possible Clinical Conditions?  Shock  Cardiogenic Shock  Hypovolemic Shock  Other Condition  Cannot Clinically determine  Signs & Symptoms:    BP: 68/29; 70/13; MAP: 33 (5/27)  HR: 82  Organ Dysfunction:  Neuro:confused, altered mental status  Circulatory:Changes consistent with mild congestive failure.Also reports poor PO intake, worsening lower ext edema, breathing heavier for 4days. Extr- 2-3plus edema pitting, brusing of 2 toes of L foot Mild vascular congestion with mild interstitial edema is seen. Ischemic cardiomyopathy EF of 40%, CAD,   Kidney: CKD stage 4,   Respiratory:pulmonary edema, metabolic acidosis, hypoxemia; ischemic cardiomyopathy, EF: 40% CAD  Diagnostics: CXR: pulmonary vascular congestion  Treatment: dopamine: 2-20 mcg/kg/min, Levophed  Others: 5/27 500 ml NSS bolus, 250 mls NSS bolus, 1000 ml NSS bolus, 5/28 NSS 750 mls bolus,    You may use possible, probable, or suspect with inpatient documentation. possible, probable, suspected diagnoses MUST be documented at the time of  discharge  Reviewed: additional documentation in the medical record  Thank You,  Amada Kingfisher  RN, BSN, CCM [email or web address] .[phone] Clinical Documentation Specialist: Health Information Management   TO RESPOND TO THE THIS QUERY, FOLLOW THE INSTRUCTIONS BELOW:  1. If needed, update documentation for the patient's encounter via the notes activity.  2. Access this query again and click edit on the In [employer].  3. After updating, or not, click F2 to complete all highlighted (required) fields concerning your review. Select "additional documentation in the medical record" OR "no additional documentation provided".  4. Click Sign note button.  5. The deficiency will fall out of your In Basket *Please let us know if you are not able to complete this workflow by phone or e-mail (listed below).

## 2013-02-06 NOTE — Progress Notes (Signed)
[facility] PROGRESS NOTE  Miranda Cruz:[id number] DOB: July 10, 1930 DOA: 01/31/2013 PCP: Hoyle Sauer, MD Brief HPI:  79 yo with past medical history of CAD, ischemic cardiomyopathy (EF 40%) and CKD (Cr 2.5) admitted 5/27 after found her Cr to be elevated to 6.7 and K elevated to 6.5. Initially DNR / refused dialysis, but code status overturned by family. PCCM was consulted. She underwent US RENAL did not show evidence of hydronephrosis, she waa also hypotensive an d was started on pressors on admission, they were discontinued on 5/30 after BP parameters normalized. She is deemed stable and transferred to telemetry to medical service on 6/1. Her renal parameters are improving and Dr Caryn Section on board for renal .     Assessment/Plan: Acute on chronic renal failure -  Improving. now nonoliguric on furosemide Hyperkalemia, Resolved.  Hypervolemic hyponatremia, resolved   Hypotension/ cardiogenic shock: unclear etiology. Probably from ischemic cardiomyopathy. She is weaned off pressors and started on hydrocortisone. Will wean her off the hydrocortisonand will need cosyntropin stimulation test to evaluate adrenal insufficiency.  Acute encephalopathy, probably metabolic : resolved.   Diabetes Mellitus:  CBG (last 3)   Recent Labs  02/05/13 2059 02/06/13 0555 02/06/13 1125  GLUCAP 167* 132* 156*    Resume SSI.   Hypothyroidism: resume synthroid.   DVT prophylaxis  Code Status: fullcode Family Communication: none at bedside. Disposition Plan: pending PT EVAL   Consultants:  PCCM Signed off on 5/31  Renal consult.  Procedures:  none  Antibiotics:  none  HPI/Subjective: Comfortable no new complaints.   Objective: Filed Vitals:   02/06/13 0419 02/06/13 0539 02/06/13 0652 02/06/13 0930  BP: 124/81     Pulse: 75   75  Temp: 97.7 F (36.5 C)     TempSrc: Oral     Resp: 17     Height:      Weight:  86.8 kg (191 lb 5.8 oz) 85.5 kg (188 lb 7.9 oz)   SpO2: 95%    97%    Intake/Output Summary (Last 24 hours) at 02/06/13 1244 Last data filed at 02/06/13 0600  Gross per 24 hour  Intake      0 ml  Output   1001 ml  Net  -1001 ml   Filed Weights   02/05/13 0700 02/06/13 0539 02/06/13 0652  Weight: 87.7 kg (193 lb 5.5 oz) 86.8 kg (191 lb 5.8 oz) 85.5 kg (188 lb 7.9 oz)    Exam:   General:  Hard of hearing, but pleasant and comfortable.  Cardiovascular: s1s2  Respiratory: CTAB  Abdomen: SOFT NT ND BS+  Musculoskeletal: pedal edema present.   Data Reviewed: Basic Metabolic Panel:  Recent Labs Lab 02/02/13 0500 02/03/13 0415 02/04/13 0420 02/05/13 0605 02/05/13 2035 02/06/13 0525  NA 134* 138 143 139  --  142  K 5.1 5.0 3.4* 2.7* 3.2* 3.4*  CL 99 100 99 95*  --  94*  CO2 14* 18* 30 31  --  34*  GLUCOSE 212* 139* 148* 170*  --  144*  BUN 123* 128* 126* 117*  --  110*  CREATININE 5.65* 5.48* 4.50* 3.75*  --  3.09*  CALCIUM 7.7* 8.1* 7.7* 8.1*  --  8.4  MG 1.3* 1.2* 1.5 2.2  --  2.0  PHOS  --  8.4* 6.5* 4.7*  --  3.6   Liver Function Tests:  Recent Labs Lab 01/31/13 1503 02/03/13 0415 02/04/13 0420 02/06/13 0525  AST 40*  --   --   --  ALT 16  --   --   --   ALKPHOS 179*  --   --   --   BILITOT 0.4  --   --   --   PROT 6.6  --   --   --   ALBUMIN 2.9* 2.4* 2.2* 2.5*   No results found for this basename: LIPASE, AMYLASE,  in the last 168 hours No results found for this basename: AMMONIA,  in the last 168 hours CBC:  Recent Labs Lab 01/31/13 2200 02/01/13 0411 02/02/13 0500 02/05/13 0605 02/05/13 1235  WBC 6.1 12.5* 11.4* 6.9 8.2  NEUTROABS  --   --  8.7*  --  7.0  HGB 9.6* 11.6* 11.0* 10.0* 10.6*  HCT 29.6* 36.2 34.2* 30.3* 32.3*  MCV 90.0 91.0 90.0 87.3 88.5  PLT 124* 191 203 175 170   Cardiac Enzymes:  Recent Labs Lab 01/31/13 1503 01/31/13 2048  CKTOTAL 120 82  CKMB  --  7.0*  TROPONINI  --  <0.30   BNP (last 3 results) No results found for this basename: PROBNP,  in the last 8760  hours CBG:  Recent Labs Lab 02/05/13 1112 02/05/13 1555 02/05/13 2059 02/06/13 0555 02/06/13 1125  GLUCAP 185* 142* 167* 132* 156*    Recent Results (from the past 240 hour(s))  MRSA PCR SCREENING     Status: None   Collection Time    01/31/13  2:01 PM      Result Value Range Status   MRSA by PCR NEGATIVE  NEGATIVE Final   Comment:            The GeneXpert MRSA Assay (FDA     approved for NASAL specimens     only), is one component of a     comprehensive MRSA colonization     surveillance program. It is not     intended to diagnose MRSA     infection nor to guide or     monitor treatment for     MRSA infections.  URINE CULTURE     Status: None   Collection Time    01/31/13  4:58 PM      Result Value Range Status   Specimen Description URINE, RANDOM   Final   Special Requests NONE   Final   Culture  Setup Time 01/31/2013 18:45   Final   Colony Count NO GROWTH   Final   Culture NO GROWTH   Final   Report Status 02/01/2013 FINAL   Final  CULTURE, BLOOD (ROUTINE X 2)     Status: None   Collection Time    01/31/13 10:14 PM      Result Value Range Status   Specimen Description BLOOD A-LINE   Final   Special Requests BOTTLES DRAWN AEROBIC ONLY 10CC   Final   Culture  Setup Time 02/01/2013 05:07   Final   Culture     Final   Value:        BLOOD CULTURE RECEIVED NO GROWTH TO DATE CULTURE WILL BE HELD FOR 5 DAYS BEFORE ISSUING A FINAL NEGATIVE REPORT   Report Status PENDING   Incomplete  CULTURE, BLOOD (ROUTINE X 2)     Status: None   Collection Time    02/01/13  1:20 AM      Result Value Range Status   Specimen Description BLOOD LEFT HAND   Final   Special Requests BOTTLES DRAWN AEROBIC ONLY 10CC   Final   Culture  Setup Time 02/01/2013  05:07   Final   Culture     Final   Value:        BLOOD CULTURE RECEIVED NO GROWTH TO DATE CULTURE WILL BE HELD FOR 5 DAYS BEFORE ISSUING A FINAL NEGATIVE REPORT   Report Status PENDING   Incomplete     Studies: Dg Chest Port 1  View  02/05/2013   *RADIOLOGY REPORT*  Clinical Data: Edema  PORTABLE CHEST - 1 VIEW  Comparison: 02/03/2013  Findings: Right arm PICC and left subclavian pacemaker are stable in position.  Mild cardiomegaly stable.  Atheromatous aorta. Patchy   left infrahilar airspace disease is somewhat more conspicuous.  Question tiny left pleural effusion as before.  IMPRESSION:  1.  Developing left infrahilar airspace disease.   Original Report Authenticated By: D. Andria Rhein, MD    Scheduled Meds: . feeding supplement  1 Container Oral BID BM  . furosemide  80 mg Oral BID  . heparin subcutaneous  5,000 Units Subcutaneous Q8H  . hydrocortisone sod succinate (SOLU-CORTEF) inj  50 mg Intravenous Daily  . insulin aspart  0-15 Units Subcutaneous TID WC  . insulin aspart  0-5 Units Subcutaneous QHS  . insulin aspart  3 Units Subcutaneous TID WC  . levofloxacin (LEVAQUIN) IV  750 mg Intravenous Q48H  . levothyroxine  125 mcg Oral QAC breakfast  . potassium chloride  40 mEq Oral Once  . senna-docusate  2 tablet Oral BID  . sevelamer carbonate  800 mg Oral TID WC  . sodium chloride  10-40 mL Intracatheter Q12H   Continuous Infusions: . sodium chloride 10 mL/hr at 02/04/13 0700    Active Problems:   ARF (acute renal failure)   CKD (chronic kidney disease) stage 4, GFR 15-29 ml/min   Metabolic acidosis   DM (diabetes mellitus)   Volume overload   Cardiomyopathy, ischemic   CAD (coronary artery disease)   Hypotension   Encephalopathy acute   Hyperkalemia    Time spent: 25 minutes.     [facility]  [facility] Pager [phone] If 7PM-7AM, please contact night-coverage at [email or web address], password [facility] 02/06/2013, 12:44 PM  LOS: 6 days

## 2013-02-07 ENCOUNTER — Inpatient Hospital Stay (HOSPITAL_COMMUNITY): Payer: Medicare Other

## 2013-02-07 LAB — GLUCOSE, CAPILLARY
Glucose-Capillary: 113 mg/dL — ABNORMAL HIGH (ref 70–99)
Glucose-Capillary: 126 mg/dL — ABNORMAL HIGH (ref 70–99)

## 2013-02-07 LAB — BASIC METABOLIC PANEL
CO2: 35 mEq/L — ABNORMAL HIGH (ref 19–32)
Calcium: 8.9 mg/dL (ref 8.4–10.5)
Chloride: 97 mEq/L (ref 96–112)
Sodium: 140 mEq/L (ref 135–145)

## 2013-02-07 LAB — CULTURE, BLOOD (ROUTINE X 2): Culture: NO GROWTH

## 2013-02-07 MED ORDER — ALPRAZOLAM 0.5 MG PO TABS
1.0000 mg | ORAL_TABLET | Freq: Every day | ORAL | Status: DC | PRN
  Administered 2013-02-10: 1 mg via ORAL
  Filled 2013-02-07 (×2): qty 1

## 2013-02-07 MED ORDER — DIPHENHYDRAMINE HCL 25 MG PO CAPS
25.0000 mg | ORAL_CAPSULE | Freq: Once | ORAL | Status: AC
  Administered 2013-02-07: 25 mg via ORAL
  Filled 2013-02-07: qty 1

## 2013-02-07 MED ORDER — DM-GUAIFENESIN ER 30-600 MG PO TB12
1.0000 | ORAL_TABLET | Freq: Two times a day (BID) | ORAL | Status: DC
  Administered 2013-02-07 (×2): 1 via ORAL
  Filled 2013-02-07 (×4): qty 1

## 2013-02-07 MED ORDER — POTASSIUM CHLORIDE CRYS ER 20 MEQ PO TBCR
40.0000 meq | EXTENDED_RELEASE_TABLET | Freq: Two times a day (BID) | ORAL | Status: DC
  Administered 2013-02-07 – 2013-02-10 (×6): 40 meq via ORAL
  Filled 2013-02-07 (×10): qty 2

## 2013-02-07 MED ORDER — ALPRAZOLAM 0.5 MG PO TABS
1.0000 mg | ORAL_TABLET | Freq: Every evening | ORAL | Status: DC | PRN
  Administered 2013-02-07: 1 mg via ORAL
  Filled 2013-02-07 (×2): qty 1

## 2013-02-07 NOTE — Progress Notes (Signed)
Patient ID: Miranda Cruz, female   DOB: 10/16/1929, 79 y.o.   MRN: [id number] S:"dont feel well today" had SOB last night. "felt like I was smothering".  States that this happens intermittently at home O:BP 157/87  Pulse 70  Temp(Src) 97.7 F (36.5 C) (Oral)  Resp 18  Ht 5\' 5"  (1.651 m)  Wt 84 kg (185 lb 3 oz)  BMI 30.82 kg/m2  SpO2 99%  Intake/Output Summary (Last 24 hours) at 02/07/13 1041 Last data filed at 02/07/13 0455  Gross per 24 hour  Intake    920 ml  Output   2777 ml  Net  -1857 ml   Intake/Output: I/O last 3 completed shifts: In: 1040 [P.O.:240; I.V.:650; IV Piggyback:150] Out: 3778 [Urine:3775; Stool:3]  Intake/Output this shift:    Weight change: -2.8 kg (-6 lb 2.8 oz) Gen:WD elderly obese WF in NAD but appears fatigued CVS:no rub Resp:scattered rhonchi, fine crackles intermittently NWG:NFAOZ HYQ:MVHQI pretib edema   Recent Labs Lab 01/31/13 1503  02/01/13 1945 02/02/13 0500 02/03/13 0415 02/04/13 0420 02/05/13 0605 02/05/13 2035 02/06/13 0525 02/07/13 0950  NA 132*  < > 139 134* 138 143 139  --  142 140  K 6.9*  < > 4.8 5.1 5.0 3.4* 2.7* 3.2* 3.4* 3.3*  CL 97  < > 104 99 100 99 95*  --  94* 97  CO2 15*  < > 17* 14* 18* 30 31  --  34* 35*  GLUCOSE 134*  < > 154* 212* 139* 148* 170*  --  144* 140*  BUN 139*  < > 127* 123* 128* 126* 117*  --  110* 95*  CREATININE 6.63*  < > 5.57* 5.65* 5.48* 4.50* 3.75*  --  3.09* 2.56*  ALBUMIN 2.9*  --   --   --  2.4* 2.2*  --   --  2.5*  --   CALCIUM 8.0*  < > 7.4* 7.7* 8.1* 7.7* 8.1*  --  8.4 8.9  PHOS  --   --   --   --  8.4* 6.5* 4.7*  --  3.6  --   AST 40*  --   --   --   --   --   --   --   --   --   ALT 16  --   --   --   --   --   --   --   --   --   < > = values in this interval not displayed. Liver Function Tests:  Recent Labs Lab 01/31/13 1503 02/03/13 0415 02/04/13 0420 02/06/13 0525  AST 40*  --   --   --   ALT 16  --   --   --   ALKPHOS 179*  --   --   --   BILITOT 0.4  --   --   --   PROT  6.6  --   --   --   ALBUMIN 2.9* 2.4* 2.2* 2.5*   No results found for this basename: LIPASE, AMYLASE,  in the last 168 hours No results found for this basename: AMMONIA,  in the last 168 hours CBC:  Recent Labs Lab 01/31/13 2200 02/01/13 0411 02/02/13 0500 02/05/13 0605 02/05/13 1235  WBC 6.1 12.5* 11.4* 6.9 8.2  NEUTROABS  --   --  8.7*  --  7.0  HGB 9.6* 11.6* 11.0* 10.0* 10.6*  HCT 29.6* 36.2 34.2* 30.3* 32.3*  MCV 90.0 91.0 90.0 87.3 88.5  PLT 124* 191 203 175 170   Cardiac Enzymes:  Recent Labs Lab 01/31/13 1503 01/31/13 2048  CKTOTAL 120 82  CKMB  --  7.0*  TROPONINI  --  <0.30   CBG:  Recent Labs Lab 02/06/13 0555 02/06/13 1125 02/06/13 1612 02/06/13 2112 02/07/13 0614  GLUCAP 132* 156* 216* 262* 113*    Iron Studies: No results found for this basename: IRON, TIBC, TRANSFERRIN, FERRITIN,  in the last 72 hours Studies/Results: No results found. Marland Kitchen dextromethorphan-guaiFENesin  1 tablet Oral BID  . feeding supplement  1 Container Oral BID BM  . furosemide  80 mg Oral BID  . heparin subcutaneous  5,000 Units Subcutaneous Q8H  . hydrocortisone sod succinate (SOLU-CORTEF) inj  50 mg Intravenous Daily  . insulin aspart  0-15 Units Subcutaneous TID WC  . insulin aspart  0-5 Units Subcutaneous QHS  . insulin aspart  3 Units Subcutaneous TID WC  . levofloxacin (LEVAQUIN) IV  750 mg Intravenous Q48H  . levothyroxine  125 mcg Oral QAC breakfast  . potassium chloride  40 mEq Oral Once  . senna-docusate  2 tablet Oral BID  . sevelamer carbonate  800 mg Oral TID WC  . sodium chloride  10-40 mL Intracatheter Q12H    BMET    Component Value Date/Time   NA 140 02/07/2013 0950   K 3.3* 02/07/2013 0950   CL 97 02/07/2013 0950   CO2 35* 02/07/2013 0950   GLUCOSE 140* 02/07/2013 0950   BUN 95* 02/07/2013 0950   CREATININE 2.56* 02/07/2013 0950   CALCIUM 8.9 02/07/2013 0950   GFRNONAA 17* 02/07/2013 0950   GFRAA 19* 02/07/2013 0950   CBC    Component Value Date/Time   WBC  8.2 02/05/2013 1235   RBC 3.65* 02/05/2013 1235   HGB 10.6* 02/05/2013 1235   HCT 32.3* 02/05/2013 1235   PLT 170 02/05/2013 1235   MCV 88.5 02/05/2013 1235   MCH 29.0 02/05/2013 1235   MCHC 32.8 02/05/2013 1235   RDW 14.7 02/05/2013 1235   LYMPHSABS 0.8 02/05/2013 1235   MONOABS 0.4 02/05/2013 1235   EOSABS 0.0 02/05/2013 1235   BASOSABS 0.0 02/05/2013 1235     Assessment/Plan:  1. SOB- diuresing well.  +prod cough. On abx. Plan per primary svc. 2. AKI/CKD- marked improvement. Scr cont to trend downward toward baseline of 2.2. Does not want HD and given advanced age and multiple comorbidities, I concur. Cont with conservative therapy. 3. AMS-improved 4. Acute on chronic CHF- improved volume status. Now on po lasix. 5. DM-per primary svc. 6. HTN- cont to hold lisinopril 7. Gout- allopurinol stopped 02/01/13 8. ICMP- last EF 45-50% with hypokinesis of anteroseptal wall. 9. Hypothyroidism 10. F/E/N- replete K and Mg. Follow levels 11. ACDz- follow 12. SHPTH- on renvela 13. Dispo- per primary svc.  14. Will sign off. Call for any questions/concerns. F/u with her PCP  Marlene Beidler A

## 2013-02-07 NOTE — Progress Notes (Signed)
Physical Therapy Treatment Patient Details Name: Miranda Cruz MRN: [id number] DOB: 04-13-1930 Today's Date: 02/07/2013 Time: 2956-2130 PT Time Calculation (min): 24 min  PT Assessment / Plan / Recommendation Comments on Treatment Session  Pt demonstrates some improvements in activity tolerance with increased strengthening exercises today.    Follow Up Recommendations  SNF;Supervision - Intermittent           Equipment Recommendations  None recommended by PT       Frequency Min 3X/week   Plan Discharge plan remains appropriate;Frequency remains appropriate    Precautions / Restrictions Precautions Precautions: Fall Precaution Comments: recent fall 1 week pta Restrictions Weight Bearing Restrictions: No       Mobility  Transfers Transfers: Sit to Stand;Stand to Sit Sit to Stand: 4: Min assist;From chair/3-in-1;With upper extremity assist;With armrests Stand to Sit: 4: Min assist;To chair/3-in-1;With upper extremity assist;With armrests Stand to Sit: Patient Percentage: 80% Stand Pivot Transfers: 4: Min assist Details for Transfer Assistance: cues for hand placment and ant wt shifting to stand. min assist with RW for stand/pivot transfer from chair to bsc and back. Ambulation/Gait Ambulation/Gait Assistance: Not tested (comment) Assistive device: Rolling walker Gait Pattern: Step-through pattern;Decreased stride length;Trunk flexed;Wide base of support Gait velocity: decreased Stairs: No Wheelchair Mobility Wheelchair Mobility: No    Exercises General Exercises - Lower Extremity Long Arc Quad: AROM;Strengthening;Both;10 reps;Seated Hip ABduction/ADduction: AROM;10 reps;Seated;Limitations Hip Abduction/Adduction Limitations: pillow sqeezes x10 Hip Flexion/Marching: AROM;Strengthening;10 reps;Seated;Standing;Limitations Hip Flexion/Marching Limitations: alt seated marches x10 each leg and standing marches x10 each leg Toe Raises: AROM;10 reps;Seated Heel Raises: AROM;10  reps;Seated Mini-Sqauts: AROM;10 reps;Standing    PT Goals Acute Rehab PT Goals Pt will go Supine/Side to Sit: with supervision;with HOB 0 degrees PT Goal: Supine/Side to Sit - Progress: Other (comment) (not addressed) Pt will go Sit to Supine/Side: with supervision;with HOB 0 degrees PT Goal: Sit to Supine/Side - Progress: Other (comment) (not addressed) Pt will go Sit to Stand: with supervision;with upper extremity assist PT Goal: Sit to Stand - Progress: Progressing toward goal Pt will Transfer Bed to Chair/Chair to Bed: with supervision PT Transfer Goal: Bed to Chair/Chair to Bed - Progress: Progressing toward goal Pt will Ambulate: 51 - 150 feet;with supervision;with rolling walker PT Goal: Ambulate - Progress: Other (comment) (not addressed)  Visit Information  Last PT Received On: 02/07/13 Assistance Needed: +1    Subjective Data  Subjective: Reports having a rough night with breathing, agreeable to some therapy in room, not gait today. Patient Stated Goal: To return home   Cognition  Cognition Arousal/Alertness: Awake/alert Behavior During Therapy: WFL for tasks assessed/performed Overall Cognitive Status: Within Functional Limits for tasks assessed    Balance  Balance Balance Assessed: Yes Static Sitting Balance Static Sitting - Balance Support: No upper extremity supported;Feet supported Static Sitting - Level of Assistance: 7: Independent Static Standing Balance Static Standing - Balance Support: Bilateral upper extremity supported;During functional activity Static Standing - Level of Assistance: 5: Stand by assistance  End of Session PT - End of Session Equipment Utilized During Treatment: Gait belt Activity Tolerance: Patient limited by fatigue Patient left: in chair;with call bell/phone within reach Nurse Communication: Mobility status   GP     Sallyanne Kuster 02/07/2013, 11:56 AM  Sallyanne Kuster, PTA Office- [phone]

## 2013-02-07 NOTE — Progress Notes (Signed)
Patient evaluated for long-term disease management services with [facility] Care Management Program as a benefit of her [employer]. Spoke with daughter Alton Revere) and granddaughter Rudell Cobb) with patient's permission. Mrs Montecalvo was on the way to xray upon this writer's arrival. Explained [facility] Care Management services to [place] and Rosey Bath and consents were signed. Confirmed contact numbers. Patient will receive a post discharge transition of care call and monthly home visits for assessments and for education. Explained that [facility] Care Management will not interfere with home health care. Mrs Kievit likely to have home health services upon discharge. Her granddaughter, Lorelle Formosa, and her daughter, Ashaunte Standley, live with her. Left [facility] Care Management packet and contact information with patient's family. Spoke with inpatient RNCM about conversation.  Raiford Noble, RN,BSN, [facility] Liaison, [phone]

## 2013-02-07 NOTE — Progress Notes (Signed)
[facility] PROGRESS NOTE  DE LIBMAN AVW:[id number] DOB: 03-May-1930 DOA: 01/31/2013 PCP: Hoyle Sauer, MD  Brief HPI:  79 yo with past medical history of CAD, ischemic cardiomyopathy (EF 40%) and CKD (Cr 2.5) admitted 5/27 after found her Cr to be elevated to 6.7 and K elevated to 6.5. Initially DNR / refused dialysis, but code status overturned by family. PCCM was consulted. She underwent US RENAL did not show evidence of hydronephrosis, she waa also hypotensive an d was started on pressors on admission, they were discontinued on 5/30 after BP parameters normalized. She is deemed stable and transferred to telemetry to medical service on 6/1. Her renal parameters are improving and Dr Caryn Section on board for renal .     Assessment/Plan: Acute on chronic renal failure -  Improving. now nonoliguric on furosemide. Her creatinine is 2.56 today  I/O last 3 completed shifts: In: 1040 [P.O.:240; I.V.:650; IV Piggyback:150] Out: 3778 [Urine:3775; Stool:3] Total I/O In: 480 [P.O.:480] Out: 502 [Urine:500; Stool:2]   Resume lasix at the current dose.   Hyperkalemia, Resolved.   Hypervolemic hyponatremia, resolved   Hypotension/ cardiogenic shock: unclear etiology. Probably from ischemic cardiomyopathy. She is weaned off pressors and started on hydrocortisone. Will wean her off the hydrocortisone and will need cosyntropin stimulation test to evaluate adrenal insufficiency.   Acute encephalopathy, probably metabolic : resolved.   Diabetes Mellitus:  CBG (last 3)   Recent Labs  02/06/13 2112 02/07/13 0614 02/07/13 1120  GLUCAP 262* 113* 126*    Resume SSI.   Hypothyroidism: resume synthroid.   Hypokalemia: replete as needed.   DVT prophylaxis  Code Status: fullcode Family Communication: daughter and grand daughter at bedside.  Disposition Plan: possibly home tomorrow.    Consultants:  PCCM Signed off on 5/31  Renal consult.   Procedures:  none  Antibiotics:  levaquin 6/2 >  HPI/Subjective: REPORTED she had sob last night, did not sleep well last 2 days as she missed her xanax dose. Resumed xanax from today.   Objective: Filed Vitals:   02/06/13 0930 02/06/13 1321 02/06/13 2118 02/07/13 0455  BP:  151/71 133/60 157/87  Pulse: 75 70 71 70  Temp:  98.2 F (36.8 C) 97.9 F (36.6 C) 97.7 F (36.5 C)  TempSrc:  Oral Oral Oral  Resp:  16 18 18   Height:      Weight:    84 kg (185 lb 3 oz)  SpO2: 97% 97% 96% 99%    Intake/Output Summary (Last 24 hours) at 02/07/13 1429 Last data filed at 02/07/13 1100  Gross per 24 hour  Intake   1040 ml  Output   3279 ml  Net  -2239 ml   Filed Weights   02/06/13 0539 02/06/13 0652 02/07/13 0455  Weight: 86.8 kg (191 lb 5.8 oz) 85.5 kg (188 lb 7.9 oz) 84 kg (185 lb 3 oz)    Exam:   General:  Hard of hearing, but pleasant and comfortable.  Cardiovascular: s1s2  Respiratory: CTAB  Abdomen: SOFT NT ND BS+  Musculoskeletal: pedal edema present.   Data Reviewed: Basic Metabolic Panel:  Recent Labs Lab 02/02/13 0500 02/03/13 0415 02/04/13 0420 02/05/13 0605 02/05/13 2035 02/06/13 0525 02/07/13 0950  NA 134* 138 143 139  --  142 140  K 5.1 5.0 3.4* 2.7* 3.2* 3.4* 3.3*  CL 99 100 99 95*  --  94* 97  CO2 14* 18* 30 31  --  34* 35*  GLUCOSE 212* 139* 148* 170*  --  144*  140*  BUN 123* 128* 126* 117*  --  110* 95*  CREATININE 5.65* 5.48* 4.50* 3.75*  --  3.09* 2.56*  CALCIUM 7.7* 8.1* 7.7* 8.1*  --  8.4 8.9  MG 1.3* 1.2* 1.5 2.2  --  2.0  --   PHOS  --  8.4* 6.5* 4.7*  --  3.6  --    Liver Function Tests:  Recent Labs Lab 01/31/13 1503 02/03/13 0415 02/04/13 0420 02/06/13 0525  AST 40*  --   --   --   ALT 16  --   --   --   ALKPHOS 179*  --   --   --   BILITOT 0.4  --   --   --   PROT 6.6  --   --   --   ALBUMIN 2.9* 2.4* 2.2* 2.5*   No results found for this basename: LIPASE, AMYLASE,  in the last 168 hours No results found for  this basename: AMMONIA,  in the last 168 hours CBC:  Recent Labs Lab 01/31/13 2200 02/01/13 0411 02/02/13 0500 02/05/13 0605 02/05/13 1235  WBC 6.1 12.5* 11.4* 6.9 8.2  NEUTROABS  --   --  8.7*  --  7.0  HGB 9.6* 11.6* 11.0* 10.0* 10.6*  HCT 29.6* 36.2 34.2* 30.3* 32.3*  MCV 90.0 91.0 90.0 87.3 88.5  PLT 124* 191 203 175 170   Cardiac Enzymes:  Recent Labs Lab 01/31/13 1503 01/31/13 2048  CKTOTAL 120 82  CKMB  --  7.0*  TROPONINI  --  <0.30   BNP (last 3 results) No results found for this basename: PROBNP,  in the last 8760 hours CBG:  Recent Labs Lab 02/06/13 1125 02/06/13 1612 02/06/13 2112 02/07/13 0614 02/07/13 1120  GLUCAP 156* 216* 262* 113* 126*    Recent Results (from the past 240 hour(s))  MRSA PCR SCREENING     Status: None   Collection Time    01/31/13  2:01 PM      Result Value Range Status   MRSA by PCR NEGATIVE  NEGATIVE Final   Comment:            The GeneXpert MRSA Assay (FDA     approved for NASAL specimens     only), is one component of a     comprehensive MRSA colonization     surveillance program. It is not     intended to diagnose MRSA     infection nor to guide or     monitor treatment for     MRSA infections.  URINE CULTURE     Status: None   Collection Time    01/31/13  4:58 PM      Result Value Range Status   Specimen Description URINE, RANDOM   Final   Special Requests NONE   Final   Culture  Setup Time 01/31/2013 18:45   Final   Colony Count NO GROWTH   Final   Culture NO GROWTH   Final   Report Status 02/01/2013 FINAL   Final  CULTURE, BLOOD (ROUTINE X 2)     Status: None   Collection Time    01/31/13 10:14 PM      Result Value Range Status   Specimen Description BLOOD A-LINE   Final   Special Requests BOTTLES DRAWN AEROBIC ONLY 10CC   Final   Culture  Setup Time 02/01/2013 05:07   Final   Culture NO GROWTH 5 DAYS   Final   Report Status 02/07/2013  FINAL   Final  CULTURE, BLOOD (ROUTINE X 2)     Status: None    Collection Time    02/01/13  1:20 AM      Result Value Range Status   Specimen Description BLOOD LEFT HAND   Final   Special Requests BOTTLES DRAWN AEROBIC ONLY 10CC   Final   Culture  Setup Time 02/01/2013 05:07   Final   Culture NO GROWTH 5 DAYS   Final   Report Status 02/07/2013 FINAL   Final     Studies: No results found.  Scheduled Meds: . dextromethorphan-guaiFENesin  1 tablet Oral BID  . feeding supplement  1 Container Oral BID BM  . furosemide  80 mg Oral BID  . heparin subcutaneous  5,000 Units Subcutaneous Q8H  . hydrocortisone sod succinate (SOLU-CORTEF) inj  50 mg Intravenous Daily  . insulin aspart  0-15 Units Subcutaneous TID WC  . insulin aspart  0-5 Units Subcutaneous QHS  . insulin aspart  3 Units Subcutaneous TID WC  . levofloxacin (LEVAQUIN) IV  750 mg Intravenous Q48H  . levothyroxine  125 mcg Oral QAC breakfast  . potassium chloride  40 mEq Oral Once  . potassium chloride  40 mEq Oral BID  . senna-docusate  2 tablet Oral BID  . sevelamer carbonate  800 mg Oral TID WC  . sodium chloride  10-40 mL Intracatheter Q12H   Continuous Infusions: . sodium chloride 10 mL/hr at 02/04/13 0700    Active Problems:   ARF (acute renal failure)   CKD (chronic kidney disease) stage 4, GFR 15-29 ml/min   Metabolic acidosis   DM (diabetes mellitus)   Volume overload   Cardiomyopathy, ischemic   CAD (coronary artery disease)   Hypotension   Encephalopathy acute   Hyperkalemia    Time spent: 25 minutes.     [facility]  [facility] Pager [phone] If 7PM-7AM, please contact night-coverage at [email or web address], password [facility] 02/07/2013, 2:29 PM  LOS: 7 days

## 2013-02-07 NOTE — Progress Notes (Signed)
Speech Language Pathology Dysphagia Treatment Patient Details Name: Miranda Cruz MRN: [id number] DOB: May 08, 1930 Today's Date: 02/07/2013 Time: 5284-1324 SLP Time Calculation (min): 8 min  Assessment / Plan / Recommendation Clinical Impression  Pt. seen for dysphagia treatment.  She recalled swallow strategy (no thin and solid in oral cavity simultaneously) without cues and able to explain clinical reasoning to SLP.  Cough x 1 prior to po's and no s/s aspiration during observation.  No cues were required to perform swallow precautions.  Recommend she continue regular texture and thin liquids, straws ok.  No ST follow up needed.    Diet Recommendation  Continue with Current Diet: Regular;Thin liquid    SLP Plan All goals met   Pertinent Vitals/Pain Yes, stated RN just gave her pain meds   Swallowing Goals  SLP Swallowing Goals Patient will utilize recommended strategies during swallow to increase swallowing safety with: Modified independent assistance Swallow Study Goal #2 - Progress: Met  General Temperature Spikes Noted: No Respiratory Status: Supplemental O2 delivered via (comment) Behavior/Cognition: Alert;Cooperative;Pleasant mood Oral Cavity - Dentition: Missing dentition (has 2 upper teeth) Patient Positioning: Upright in chair  Oral Cavity - Oral Hygiene Does patient have any of the following "at risk" factors?: Oxygen therapy - cannula, mask, simple oxygen devices Brush patient's teeth BID with toothbrush (using toothpaste with fluoride): Yes Patient is AT RISK - Oral Care Protocol followed (see row info): Yes   Dysphagia Treatment Treatment focused on: Skilled observation of diet tolerance;Utilization of compensatory strategies Treatment Methods/Modalities: Skilled observation Patient observed directly with PO's: Yes Type of PO's observed: Regular;Thin liquids Feeding: Able to feed self Liquids provided via: Cup;Straw Amount of cueing:  (independent)   GO    Royce Macadamia M.Ed CCC-SLP Pager [phone]  02/07/2013  Royce Macadamia 02/07/2013, 9:55 AM

## 2013-02-08 DIAGNOSIS — J189 Pneumonia, unspecified organism: Secondary | ICD-10-CM

## 2013-02-08 DIAGNOSIS — R042 Hemoptysis: Secondary | ICD-10-CM | POA: Diagnosis present

## 2013-02-08 LAB — MAGNESIUM: Magnesium: 1.3 mg/dL — ABNORMAL LOW (ref 1.5–2.5)

## 2013-02-08 LAB — BASIC METABOLIC PANEL
CO2: 31 mEq/L (ref 19–32)
Calcium: 9.1 mg/dL (ref 8.4–10.5)
Creatinine, Ser: 2.43 mg/dL — ABNORMAL HIGH (ref 0.50–1.10)
GFR calc non Af Amer: 18 mL/min — ABNORMAL LOW (ref >90)
Sodium: 140 mEq/L (ref 135–145)

## 2013-02-08 LAB — GLUCOSE, CAPILLARY
Glucose-Capillary: 127 mg/dL — ABNORMAL HIGH (ref 70–99)
Glucose-Capillary: 162 mg/dL — ABNORMAL HIGH (ref 70–99)
Glucose-Capillary: 290 mg/dL — ABNORMAL HIGH (ref 70–99)

## 2013-02-08 MED ORDER — MAGNESIUM SULFATE 40 MG/ML IJ SOLN
2.0000 g | Freq: Once | INTRAMUSCULAR | Status: AC
  Administered 2013-02-08: 2 g via INTRAVENOUS
  Filled 2013-02-08: qty 50

## 2013-02-08 MED ORDER — BENZONATATE 100 MG PO CAPS
100.0000 mg | ORAL_CAPSULE | Freq: Three times a day (TID) | ORAL | Status: DC | PRN
  Filled 2013-02-08: qty 1

## 2013-02-08 MED ORDER — MAGNESIUM SULFATE 50 % IJ SOLN
2.0000 g | Freq: Once | INTRAVENOUS | Status: DC
  Filled 2013-02-08: qty 4

## 2013-02-08 MED ORDER — GUAIFENESIN ER 600 MG PO TB12
600.0000 mg | ORAL_TABLET | Freq: Two times a day (BID) | ORAL | Status: DC
  Administered 2013-02-08 – 2013-02-10 (×6): 600 mg via ORAL
  Filled 2013-02-08 (×9): qty 1

## 2013-02-08 MED ORDER — GUAIFENESIN-DM 100-10 MG/5ML PO SYRP: 5.0000 mL | ORAL_SOLUTION | ORAL | Status: DC | PRN

## 2013-02-08 MED ORDER — PREDNISONE 20 MG PO TABS
40.0000 mg | ORAL_TABLET | Freq: Every day | ORAL | Status: DC
  Administered 2013-02-08: 40 mg via ORAL
  Filled 2013-02-08 (×4): qty 2

## 2013-02-08 MED ORDER — ALBUTEROL SULFATE (5 MG/ML) 0.5% IN NEBU
2.5000 mg | INHALATION_SOLUTION | Freq: Four times a day (QID) | RESPIRATORY_TRACT | Status: DC | PRN
  Administered 2013-02-10: 2.5 mg via RESPIRATORY_TRACT
  Filled 2013-02-08: qty 0.5

## 2013-02-08 MED ORDER — ALBUTEROL SULFATE (5 MG/ML) 0.5% IN NEBU
2.5000 mg | INHALATION_SOLUTION | Freq: Two times a day (BID) | RESPIRATORY_TRACT | Status: DC
  Administered 2013-02-08 – 2013-02-10 (×4): 2.5 mg via RESPIRATORY_TRACT
  Filled 2013-02-08 (×5): qty 0.5

## 2013-02-08 MED ORDER — ALBUTEROL SULFATE (5 MG/ML) 0.5% IN NEBU
2.5000 mg | INHALATION_SOLUTION | Freq: Four times a day (QID) | RESPIRATORY_TRACT | Status: DC
  Administered 2013-02-08: 2.5 mg via RESPIRATORY_TRACT
  Filled 2013-02-08: qty 0.5

## 2013-02-08 NOTE — Clinical Social Work Note (Signed)
CSW consulted for SNF. CSW met with pt who reports her dtr and grandtr will provide 24 hour supervision/assist post d/c. Pt has declined SNF placement. RNCM following for [facility] needs. CSW signing off as no other CSW needs identified at this time.  Dellie Burns, MSW, LCSWA [phone] (Coverage)

## 2013-02-08 NOTE — Progress Notes (Signed)
Patient ID: Miranda Cruz  female  ZOX:[id number]    DOB: July 28, 1930    DOA: 01/31/2013  PCP: Hoyle Sauer, MD  Assessment/Plan: Principal Problem:   ARF (acute renal failure) on CKD (chronic kidney disease) stage 4, GFR 15-29 ml/min - Improving, nephrology service following, continue Lasix  Active Problems: Hemoptysis with bilateral pneumonia: Likely from tracheobronchitis - DC heparin subcutaneous, placed on albuterol nebs, IV Levaquin   - Placed on prednisone with taper  DM (diabetes mellitus) - Continue sliding scale insulin    Cardiomyopathy, ischemic/  CAD (coronary artery disease): Currently stable  Hyperkalemia, Resolved.   Hypervolemic hyponatremia, resolved   Hypotension/ cardiogenic shock: unclear etiology. Probably from ischemic cardiomyopathy. She is weaned off pressors and was started on hydrocortisone, weaned off yesterday.   Acute encephalopathy, probably metabolic : resolved.    DVT Prophylaxis: SCDs  Code Status:  Disposition: Hopefully tomorrow improving    Subjective: Patient complaining of hemoptysis, her coughing has somewhat improved with Mucinex. Wants more frequent Mucinex.  Objective: Weight change: 0.8 kg (1 lb 12.2 oz)  Intake/Output Summary (Last 24 hours) at 02/08/13 1328 Last data filed at 02/08/13 0900  Gross per 24 hour  Intake    120 ml  Output    326 ml  Net   -206 ml   Blood pressure 144/66, pulse 70, temperature 97.7 F (36.5 C), temperature source Oral, resp. rate 18, height 5\' 5"  (1.651 m), weight 84.8 kg (186 lb 15.2 oz), SpO2 99.00%.  Physical Exam: General: Alert and awake, oriented x3, not in any acute distress. CVS: S1-S2 clear, no murmur rubs or gallops Chest bilateral rhonchi Abdomen: soft nontender, nondistended, normal bowel sounds  Extremities: no cyanosis, clubbing,  +edema noted bilaterally   Lab Results: Basic Metabolic Panel:  Recent Labs Lab 02/06/13 0525 02/07/13 0950 02/08/13 0610  NA 142 140 140   K 3.4* 3.3* 3.8  CL 94* 97 97  CO2 34* 35* 31  GLUCOSE 144* 140* 136*  BUN 110* 95* 88*  CREATININE 3.09* 2.56* 2.43*  CALCIUM 8.4 8.9 9.1  MG 2.0  --  1.3*  PHOS 3.6  --   --    Liver Function Tests:  Recent Labs Lab 02/04/13 0420 02/06/13 0525  ALBUMIN 2.2* 2.5*   No results found for this basename: LIPASE, AMYLASE,  in the last 168 hours No results found for this basename: AMMONIA,  in the last 168 hours CBC:  Recent Labs Lab 02/05/13 0605 02/05/13 1235  WBC 6.9 8.2  NEUTROABS  --  7.0  HGB 10.0* 10.6*  HCT 30.3* 32.3*  MCV 87.3 88.5  PLT 175 170   Cardiac Enzymes: No results found for this basename: CKTOTAL, CKMB, CKMBINDEX, TROPONINI,  in the last 168 hours BNP: No components found with this basename: POCBNP,  CBG:  Recent Labs Lab 02/07/13 1120 02/07/13 1641 02/07/13 2058 02/08/13 0610 02/08/13 1104  GLUCAP 126* 183* 178* 127* 162*     Micro Results: Recent Results (from the past 240 hour(s))  MRSA PCR SCREENING     Status: None   Collection Time    01/31/13  2:01 PM      Result Value Range Status   MRSA by PCR NEGATIVE  NEGATIVE Final   Comment:            The GeneXpert MRSA Assay (FDA     approved for NASAL specimens     only), is one component of a     comprehensive MRSA colonization  surveillance program. It is not     intended to diagnose MRSA     infection nor to guide or     monitor treatment for     MRSA infections.  URINE CULTURE     Status: None   Collection Time    01/31/13  4:58 PM      Result Value Range Status   Specimen Description URINE, RANDOM   Final   Special Requests NONE   Final   Culture  Setup Time 01/31/2013 18:45   Final   Colony Count NO GROWTH   Final   Culture NO GROWTH   Final   Report Status 02/01/2013 FINAL   Final  CULTURE, BLOOD (ROUTINE X 2)     Status: None   Collection Time    01/31/13 10:14 PM      Result Value Range Status   Specimen Description BLOOD A-LINE   Final   Special Requests  BOTTLES DRAWN AEROBIC ONLY 10CC   Final   Culture  Setup Time 02/01/2013 05:07   Final   Culture NO GROWTH 5 DAYS   Final   Report Status 02/07/2013 FINAL   Final  CULTURE, BLOOD (ROUTINE X 2)     Status: None   Collection Time    02/01/13  1:20 AM      Result Value Range Status   Specimen Description BLOOD LEFT HAND   Final   Special Requests BOTTLES DRAWN AEROBIC ONLY 10CC   Final   Culture  Setup Time 02/01/2013 05:07   Final   Culture NO GROWTH 5 DAYS   Final   Report Status 02/07/2013 FINAL   Final    Studies/Results: Dg Chest 2 View  02/07/2013   *RADIOLOGY REPORT*  Clinical Data: Cough, congestion and weakness  CHEST - 2 VIEW  Comparison: 02/05/2013  Findings: Patchy perihilar airspace opacity is noted extending to the medial lung bases.  This has increased from the prior study mostly in the perihilar regions.  The cardiac silhouette is mildly enlarged.  No mediastinal or hilar masses.  Stable right PICC and left anterior chest wall sequential pacemaker.  IMPRESSION: Increase in parahilar airspace opacities with persistent medial lung base opacity.  This suggests bilateral pneumonia.  It could reflect asymmetric edema.   Original Report Authenticated By: Amie Portland, M.D.   US Renal Port  02/01/2013   *RADIOLOGY REPORT*  Clinical Data:  Acute renal failure.  RENAL/URINARY TRACT ULTRASOUND COMPLETE  Comparison:  CT of the abdomen and pelvis performed 07/18/2009, and renal ultrasound performed 12/14/2007  Findings:  Right Kidney:  The right kidney measures 10.4 cm in length.  The kidney demonstrates normal size, echogenicity and configuration. There is mild cortical thinning at the upper pole of the right kidney.  No hydronephrosis or calcification is identified.  No masses are seen.  Left Kidney:  The left kidney measures 8.1 cm in length.  Not well assessed due to limitations in positioning.  The kidney demonstrates grossly normal size, echogenicity and configuration. No significant  cortical thinning is seen.  No hydronephrosis or calcification is identified.  No masses are seen.  Bladder:  The bladder is not visualized; the known Foley catheter is not seen.  IMPRESSION: No evidence of hydronephrosis; no acute abnormality seen to explain the patient's renal failure.  Mild cortical thinning at the upper pole of the right kidney; the left kidney is difficult to fully characterize due to limitations in positioning.   Original Report Authenticated By: Tonia Ghent, M.D.  Dg Chest Port 1 View  02/05/2013   *RADIOLOGY REPORT*  Clinical Data: Edema  PORTABLE CHEST - 1 VIEW  Comparison: 02/03/2013  Findings: Right arm PICC and left subclavian pacemaker are stable in position.  Mild cardiomegaly stable.  Atheromatous aorta. Patchy   left infrahilar airspace disease is somewhat more conspicuous.  Question tiny left pleural effusion as before.  IMPRESSION:  1.  Developing left infrahilar airspace disease.   Original Report Authenticated By: D. Andria Rhein, MD   Dg Chest Port 1 View  02/03/2013   *RADIOLOGY REPORT*  Clinical Data: Respiratory failure.  PORTABLE CHEST - 1 VIEW  Comparison: Chest 02/02/2013 and 01/31/2013.  Findings: Right PICC remains in place.  Right pleural effusion and basilar airspace disease appear mildly improved.  Small left effusion and airspace disease are unchanged.  Heart size is upper normal.  No pneumothorax.  IMPRESSION: Some improvement in a right pleural effusion basilar airspace disease.  No new abnormality.   Original Report Authenticated By: Holley Dexter, M.D.   Dg Chest Port 1 View  02/02/2013   *RADIOLOGY REPORT*  Clinical Data: Evaluate CHF.  PORTABLE CHEST - 1 VIEW  Comparison: 01/31/2013  Findings: Cardiomegaly.  Worsening bilateral perihilar and lower lobe airspace opacities, right greater than left.  This represent asymmetric edema or infection.  Small bilateral pleural effusions, also greater on the right, new since prior study.  Left pacer and right  PICC line are unchanged.  No acute bony abnormality.  IMPRESSION: Worsening bilateral airspace opacities and effusions as above. These findings are greater on the right.  This could represent asymmetric edema/CHF or infection.   Original Report Authenticated By: Charlett Nose, M.D.   Dg Chest Port 1 View  01/31/2013   *RADIOLOGY REPORT*  Clinical Data: Evaluate line placement.  PORTABLE CHEST - 1 VIEW  Comparison: Chest x-ray 01/30/2013.  Findings: There is a right upper extremity PICC with tip terminating in the distal superior vena cava. Lung volumes are low. No acute consolidative airspace disease.  No definite pleural effusions.  Cephalization of the pulmonary vasculature, without frank pulmonary edema.  Mild cardiomegaly is unchanged.  Upper mediastinal contours are within normal limits.  Atherosclerosis in the thoracic aorta.  Left-sided pacemaker device in place with lead tips projecting over the expected location of the right ventricle and right atrium.  IMPRESSION: 1.  Interval placement of right upper extremity PICC with tip terminating in the distal superior vena cava. 2.  Cardiomegaly with pulmonary venous congestion, but no frank pulmonary edema. 3.  Atherosclerosis.   Original Report Authenticated By: Trudie Reed, M.D.   Dg Chest Port 1 View  01/31/2013   *RADIOLOGY REPORT*  Clinical Data: Volume overload, renal failure  PORTABLE CHEST - 1 VIEW  Comparison: 10/11/2008  Findings: The cardiac shadow is enlarged.  A pacing device is again identified.  Mild vascular congestion with mild interstitial edema is seen.  No sizable effusion or infiltrate is noted.  IMPRESSION: Changes consistent with mild congestive failure.   Original Report Authenticated By: Alcide Clever, M.D.   Dg Foot 2 Views Left  02/01/2013   *RADIOLOGY REPORT*  Clinical Data: Second and third toe bruising with left foot swelling.  LEFT FOOT - 2 VIEW  Comparison: None.  Findings: Diffuse soft tissue swelling appears worst along  the dorsum of the foot.  Mild degenerative changes at the first metatarsal phalangeal joint with a small erosion along the lateral base of the first proximal phalanx.  No definite acute osseous abnormality.  Prominent calcaneal spurs.  IMPRESSION:  1.  Diffuse soft tissue swelling without definite acute osseous abnormality. 2.  Mild first metatarsal phalangeal joint osteoarthritis with a small erosion along the lateral base of the first proximal phalanx.   Original Report Authenticated By: Leanna Battles, M.D.    Medications: Scheduled Meds: . albuterol  2.5 mg Nebulization BID  . feeding supplement  1 Container Oral BID BM  . furosemide  80 mg Oral BID  . guaiFENesin  600 mg Oral BID  . insulin aspart  0-15 Units Subcutaneous TID WC  . insulin aspart  0-5 Units Subcutaneous QHS  . insulin aspart  3 Units Subcutaneous TID WC  . levofloxacin (LEVAQUIN) IV  750 mg Intravenous Q48H  . levothyroxine  125 mcg Oral QAC breakfast  . potassium chloride  40 mEq Oral Once  . potassium chloride  40 mEq Oral BID  . senna-docusate  2 tablet Oral BID  . sevelamer carbonate  800 mg Oral TID WC  . sodium chloride  10-40 mL Intracatheter Q12H      LOS: 8 days   RAI,RIPUDEEP M.D. [facility] 02/08/2013, 1:28 PM Pager: [phone]  If 7PM-7AM, please contact night-coverage [email or web address] Password [email or web address]

## 2013-02-09 LAB — GLUCOSE, CAPILLARY
Glucose-Capillary: 162 mg/dL — ABNORMAL HIGH (ref 70–99)
Glucose-Capillary: 180 mg/dL — ABNORMAL HIGH (ref 70–99)
Glucose-Capillary: 126 mg/dL — ABNORMAL HIGH (ref 70–99)

## 2013-02-09 LAB — BASIC METABOLIC PANEL
BUN: 76 mg/dL — ABNORMAL HIGH (ref 6–23)
Calcium: 9.6 mg/dL (ref 8.4–10.5)
GFR calc Af Amer: 22 mL/min — ABNORMAL LOW (ref >90)
GFR calc non Af Amer: 19 mL/min — ABNORMAL LOW (ref >90)
Glucose, Bld: 206 mg/dL — ABNORMAL HIGH (ref 70–99)
Sodium: 137 mEq/L (ref 135–145)

## 2013-02-09 LAB — LEGIONELLA ANTIGEN, URINE

## 2013-02-09 MED ORDER — OXYCODONE HCL 5 MG PO TABS
10.0000 mg | ORAL_TABLET | Freq: Four times a day (QID) | ORAL | Status: DC | PRN
  Administered 2013-02-09 (×2): 10 mg via ORAL
  Filled 2013-02-09 (×2): qty 2

## 2013-02-09 MED ORDER — ISOSORBIDE MONONITRATE ER 30 MG PO TB24
30.0000 mg | ORAL_TABLET | Freq: Every day | ORAL | Status: DC
  Administered 2013-02-09 – 2013-02-10 (×2): 30 mg via ORAL
  Filled 2013-02-09 (×3): qty 1

## 2013-02-09 MED ORDER — BUDESONIDE 0.25 MG/2ML IN SUSP
0.2500 mg | Freq: Two times a day (BID) | RESPIRATORY_TRACT | Status: DC
  Administered 2013-02-09 – 2013-02-10 (×2): 0.25 mg via RESPIRATORY_TRACT
  Filled 2013-02-09 (×7): qty 2

## 2013-02-09 MED ORDER — METHOCARBAMOL 500 MG PO TABS
500.0000 mg | ORAL_TABLET | Freq: Four times a day (QID) | ORAL | Status: DC | PRN
  Administered 2013-02-09: 500 mg via ORAL
  Filled 2013-02-09: qty 1

## 2013-02-09 MED ORDER — METOPROLOL TARTRATE 12.5 MG HALF TABLET
12.5000 mg | ORAL_TABLET | Freq: Two times a day (BID) | ORAL | Status: DC
  Administered 2013-02-09 – 2013-02-10 (×4): 12.5 mg via ORAL
  Filled 2013-02-09 (×6): qty 1

## 2013-02-09 MED ORDER — METOPROLOL TARTRATE 25 MG PO TABS: 25.0000 mg | ORAL_TABLET | Freq: Two times a day (BID) | ORAL | Status: DC

## 2013-02-09 NOTE — Progress Notes (Signed)
Occupational Therapy Treatment Patient Details Name: NELISSA BOLDUC MRN: [id number] DOB: 1930/03/23 Today's Date: 02/09/2013 Time: 6213-0865 OT Time Calculation (min): 33 min  OT Assessment / Plan / Recommendation    Follow Up Recommendations  Home health OT;Supervision/Assistance - 24 hour                Plan Discharge plan needs to be updated    Precautions / Restrictions Precautions Precautions: Fall Restrictions Weight Bearing Restrictions: No       ADL  Grooming: Performed;Wash/dry hands;Wash/dry face;Brushing hair;Other (comment) (washing hair with shampoo cap) Where Assessed - Grooming: Unsupported sitting Toilet Transfer: Moderate assistance Toilet Transfer Method: Sit to stand;Other (comment) (sit to stand from bed only for repositioning) Transfers/Ambulation Related to ADLs: Pt needed increased A this OT visit as pt stated she started a new medicene and she wasnt feeling her self.  Pt did participate with OT with encouagment ADL Comments: Pt sat EOB for approx 25 minutes for grooming activity with sit to stand as needed when legs felt like they were cramping.      OT Goals ADL Goals ADL Goal: Grooming - Progress: Progressing toward goals ADL Goal: Toilet Transfer - Progress: Progressing toward goals  Visit Information  Last OT Received On: 02/09/13    Subjective Data  Subjective: i got a new medicene today and I have had a rough morning      Cognition  Cognition Arousal/Alertness: Awake/alert Behavior During Therapy: WFL for tasks assessed/performed Overall Cognitive Status: Within Functional Limits for tasks assessed    Mobility  Bed Mobility Bed Mobility: Supine to Sit;Sit to Supine Rolling Left: 4: Min assist;With rail Sit to Supine: 3: Mod assist Transfers Transfers: Sit to Stand;Stand to Sit Sit to Stand: 3: Mod assist;Without upper extremity assist;From bed Stand to Sit: 4: Min assist;With upper extremity assist          End of Session OT - End  of Session Activity Tolerance: Patient tolerated treatment well Patient left: in bed;with call bell/phone within reach  GO     Luis Sami, [place] 02/09/2013, 10:58 AM

## 2013-02-09 NOTE — Progress Notes (Signed)
Patient ID: Miranda Cruz  female  ZOX:[id number]    DOB: [birth year]    DOA: 01/31/2013  PCP: Hoyle Sauer, MD  Assessment/Plan: Principal Problem:   ARF (acute renal failure) on CKD (chronic kidney disease) stage 4, GFR 15-29 ml/min - Improving, nephrology service following, continue Lasix  Active Problems: Hemoptysis with bilateral pneumonia: Likely from tracheobronchitis, improving per patient - DC heparin subcutaneous, placed on albuterol nebs, IV Levaquin   -Discontinue prednisone as patient is somewhat confused today and the family requested prednisone to be discontinued  DM (diabetes mellitus) - Continue sliding scale insulin    Cardiomyopathy, ischemic/  CAD (coronary artery disease): Currently stable  Hyperkalemia, Resolved.   Hypervolemic hyponatremia, resolved   Hypotension/ cardiogenic shock: unclear etiology. Probably from ischemic cardiomyopathy. She is weaned off pressors and was started on hydrocortisone, weaned off yesterday.   Acute encephalopathy, probably metabolic : resolved.    DVT Prophylaxis: SCDs  Code Status:  Disposition: DC in a.m.    Subjective: Patient complaining of hemoptysis, her coughing has somewhat improved with Mucinex. Wants more frequent Mucinex.  Objective: Weight change: -1.8 kg (-3 lb 15.5 oz)  Intake/Output Summary (Last 24 hours) at 02/09/13 1245 Last data filed at 02/09/13 0900  Gross per 24 hour  Intake      0 ml  Output    851 ml  Net   -851 ml   Blood pressure 156/74, pulse 71, temperature 97.8 F (36.6 C), temperature source Oral, resp. rate 18, height 5\' 5"  (1.651 m), weight 83 kg (182 lb 15.7 oz), SpO2 95.00%.  Physical Exam: General: Alert and awake, oriented x3, NAD CVS: S1-S2 clear, no murmur rubs or gallops Chest bilateral rhonchi throughout Abdomen: soft NT, ND. normal bowel sounds  Extremities: no cyanosis, clubbing,  +edema noted bilaterally   Lab Results: Basic Metabolic Panel:  Recent Labs Lab  02/06/13 0525  02/08/13 0610 02/09/13 1130  NA 142  < > 140 137  K 3.4*  < > 3.8 4.9  CL 94*  < > 97 94*  CO2 34*  < > 31 30  GLUCOSE 144*  < > 136* 206*  BUN 110*  < > 88* 76*  CREATININE 3.09*  < > 2.43* 2.32*  CALCIUM 8.4  < > 9.1 9.6  MG 2.0  --  1.3*  --   PHOS 3.6  --   --   --   < > = values in this interval not displayed. Liver Function Tests:  Recent Labs Lab 02/04/13 0420 02/06/13 0525  ALBUMIN 2.2* 2.5*   No results found for this basename: LIPASE, AMYLASE,  in the last 168 hours No results found for this basename: AMMONIA,  in the last 168 hours CBC:  Recent Labs Lab 02/05/13 0605 02/05/13 1235  WBC 6.9 8.2  NEUTROABS  --  7.0  HGB 10.0* 10.6*  HCT 30.3* 32.3*  MCV 87.3 88.5  PLT 175 170   Cardiac Enzymes: No results found for this basename: CKTOTAL, CKMB, CKMBINDEX, TROPONINI,  in the last 168 hours BNP: No components found with this basename: POCBNP,  CBG:  Recent Labs Lab 02/08/13 1104 02/08/13 1623 02/08/13 2055 02/09/13 0601 02/09/13 1123  GLUCAP 162* 290* 162* 168* 188*     Micro Results: Recent Results (from the past 240 hour(s))  MRSA PCR SCREENING     Status: None   Collection Time    01/31/13  2:01 PM      Result Value Range Status   MRSA by  PCR NEGATIVE  NEGATIVE Final   Comment:            The GeneXpert MRSA Assay (FDA     approved for NASAL specimens     only), is one component of a     comprehensive MRSA colonization     surveillance program. It is not     intended to diagnose MRSA     infection nor to guide or     monitor treatment for     MRSA infections.  URINE CULTURE     Status: None   Collection Time    01/31/13  4:58 PM      Result Value Range Status   Specimen Description URINE, RANDOM   Final   Special Requests NONE   Final   Culture  Setup Time 01/31/2013 18:45   Final   Colony Count NO GROWTH   Final   Culture NO GROWTH   Final   Report Status 02/01/2013 FINAL   Final  CULTURE, BLOOD (ROUTINE X 2)      Status: None   Collection Time    01/31/13 10:14 PM      Result Value Range Status   Specimen Description BLOOD A-LINE   Final   Special Requests BOTTLES DRAWN AEROBIC ONLY 10CC   Final   Culture  Setup Time 02/01/2013 05:07   Final   Culture NO GROWTH 5 DAYS   Final   Report Status 02/07/2013 FINAL   Final  CULTURE, BLOOD (ROUTINE X 2)     Status: None   Collection Time    02/01/13  1:20 AM      Result Value Range Status   Specimen Description BLOOD LEFT HAND   Final   Special Requests BOTTLES DRAWN AEROBIC ONLY 10CC   Final   Culture  Setup Time 02/01/2013 05:07   Final   Culture NO GROWTH 5 DAYS   Final   Report Status 02/07/2013 FINAL   Final    Studies/Results: Dg Chest 2 View  02/07/2013   *RADIOLOGY REPORT*  Clinical Data: Cough, congestion and weakness  CHEST - 2 VIEW  Comparison: 02/05/2013  Findings: Patchy perihilar airspace opacity is noted extending to the medial lung bases.  This has increased from the prior study mostly in the perihilar regions.  The cardiac silhouette is mildly enlarged.  No mediastinal or hilar masses.  Stable right PICC and left anterior chest wall sequential pacemaker.  IMPRESSION: Increase in parahilar airspace opacities with persistent medial lung base opacity.  This suggests bilateral pneumonia.  It could reflect asymmetric edema.   Original Report Authenticated By: Amie Portland, M.D.   US Renal Port  02/01/2013   *RADIOLOGY REPORT*  Clinical Data:  Acute renal failure.  RENAL/URINARY TRACT ULTRASOUND COMPLETE  Comparison:  CT of the abdomen and pelvis performed 07/18/2009, and renal ultrasound performed 12/14/2007  Findings:  Right Kidney:  The right kidney measures 10.4 cm in length.  The kidney demonstrates normal size, echogenicity and configuration. There is mild cortical thinning at the upper pole of the right kidney.  No hydronephrosis or calcification is identified.  No masses are seen.  Left Kidney:  The left kidney measures 8.1 cm in length.   Not well assessed due to limitations in positioning.  The kidney demonstrates grossly normal size, echogenicity and configuration. No significant cortical thinning is seen.  No hydronephrosis or calcification is identified.  No masses are seen.  Bladder:  The bladder is not visualized; the known Foley catheter  is not seen.  IMPRESSION: No evidence of hydronephrosis; no acute abnormality seen to explain the patient's renal failure.  Mild cortical thinning at the upper pole of the right kidney; the left kidney is difficult to fully characterize due to limitations in positioning.   Original Report Authenticated By: Tonia Ghent, M.D.   Dg Chest Port 1 View  02/05/2013   *RADIOLOGY REPORT*  Clinical Data: Edema  PORTABLE CHEST - 1 VIEW  Comparison: 02/03/2013  Findings: Right arm PICC and left subclavian pacemaker are stable in position.  Mild cardiomegaly stable.  Atheromatous aorta. Patchy   left infrahilar airspace disease is somewhat more conspicuous.  Question tiny left pleural effusion as before.  IMPRESSION:  1.  Developing left infrahilar airspace disease.   Original Report Authenticated By: D. Andria Rhein, MD   Dg Chest Port 1 View  02/03/2013   *RADIOLOGY REPORT*  Clinical Data: Respiratory failure.  PORTABLE CHEST - 1 VIEW  Comparison: Chest 02/02/2013 and 01/31/2013.  Findings: Right PICC remains in place.  Right pleural effusion and basilar airspace disease appear mildly improved.  Small left effusion and airspace disease are unchanged.  Heart size is upper normal.  No pneumothorax.  IMPRESSION: Some improvement in a right pleural effusion basilar airspace disease.  No new abnormality.   Original Report Authenticated By: Holley Dexter, M.D.   Dg Chest Port 1 View  02/02/2013   *RADIOLOGY REPORT*  Clinical Data: Evaluate CHF.  PORTABLE CHEST - 1 VIEW  Comparison: 01/31/2013  Findings: Cardiomegaly.  Worsening bilateral perihilar and lower lobe airspace opacities, right greater than left.  This  represent asymmetric edema or infection.  Small bilateral pleural effusions, also greater on the right, new since prior study.  Left pacer and right PICC line are unchanged.  No acute bony abnormality.  IMPRESSION: Worsening bilateral airspace opacities and effusions as above. These findings are greater on the right.  This could represent asymmetric edema/CHF or infection.   Original Report Authenticated By: Charlett Nose, M.D.   Dg Chest Port 1 View  01/31/2013   *RADIOLOGY REPORT*  Clinical Data: Evaluate line placement.  PORTABLE CHEST - 1 VIEW  Comparison: Chest x-ray 01/30/2013.  Findings: There is a right upper extremity PICC with tip terminating in the distal superior vena cava. Lung volumes are low. No acute consolidative airspace disease.  No definite pleural effusions.  Cephalization of the pulmonary vasculature, without frank pulmonary edema.  Mild cardiomegaly is unchanged.  Upper mediastinal contours are within normal limits.  Atherosclerosis in the thoracic aorta.  Left-sided pacemaker device in place with lead tips projecting over the expected location of the right ventricle and right atrium.  IMPRESSION: 1.  Interval placement of right upper extremity PICC with tip terminating in the distal superior vena cava. 2.  Cardiomegaly with pulmonary venous congestion, but no frank pulmonary edema. 3.  Atherosclerosis.   Original Report Authenticated By: Trudie Reed, M.D.   Dg Chest Port 1 View  01/31/2013   *RADIOLOGY REPORT*  Clinical Data: Volume overload, renal failure  PORTABLE CHEST - 1 VIEW  Comparison: 10/11/2008  Findings: The cardiac shadow is enlarged.  A pacing device is again identified.  Mild vascular congestion with mild interstitial edema is seen.  No sizable effusion or infiltrate is noted.  IMPRESSION: Changes consistent with mild congestive failure.   Original Report Authenticated By: Alcide Clever, M.D.   Dg Foot 2 Views Left  02/01/2013   *RADIOLOGY REPORT*  Clinical Data: Second  and third toe bruising with left  foot swelling.  LEFT FOOT - 2 VIEW  Comparison: None.  Findings: Diffuse soft tissue swelling appears worst along the dorsum of the foot.  Mild degenerative changes at the first metatarsal phalangeal joint with a small erosion along the lateral base of the first proximal phalanx.  No definite acute osseous abnormality.  Prominent calcaneal spurs.  IMPRESSION:  1.  Diffuse soft tissue swelling without definite acute osseous abnormality. 2.  Mild first metatarsal phalangeal joint osteoarthritis with a small erosion along the lateral base of the first proximal phalanx.   Original Report Authenticated By: Leanna Battles, M.D.    Medications: Scheduled Meds: . albuterol  2.5 mg Nebulization BID  . budesonide  0.25 mg Nebulization BID  . feeding supplement  1 Container Oral BID BM  . furosemide  80 mg Oral BID  . guaiFENesin  600 mg Oral BID  . insulin aspart  0-15 Units Subcutaneous TID WC  . insulin aspart  0-5 Units Subcutaneous QHS  . insulin aspart  3 Units Subcutaneous TID WC  . isosorbide mononitrate  30 mg Oral Daily  . levofloxacin (LEVAQUIN) IV  750 mg Intravenous Q48H  . levothyroxine  125 mcg Oral QAC breakfast  . metoprolol tartrate  12.5 mg Oral BID  . potassium chloride  40 mEq Oral Once  . potassium chloride  40 mEq Oral BID  . senna-docusate  2 tablet Oral BID  . sevelamer carbonate  800 mg Oral TID WC  . sodium chloride  10-40 mL Intracatheter Q12H      LOS: 9 days   RAI,RIPUDEEP M.D. [facility] 02/09/2013, 12:45 PM Pager: [phone]  If 7PM-7AM, please contact night-coverage [email or web address] Password [email or web address]

## 2013-02-10 ENCOUNTER — Inpatient Hospital Stay (HOSPITAL_COMMUNITY): Payer: Medicare Other

## 2013-02-10 LAB — URINALYSIS, ROUTINE W REFLEX MICROSCOPIC
Bilirubin Urine: NEGATIVE
Glucose, UA: NEGATIVE mg/dL
Ketones, ur: NEGATIVE mg/dL
Nitrite: NEGATIVE
Specific Gravity, Urine: 1.013 (ref 1.005–1.030)
pH: 6 (ref 5.0–8.0)

## 2013-02-10 LAB — GLUCOSE, CAPILLARY
Glucose-Capillary: 177 mg/dL — ABNORMAL HIGH (ref 70–99)
Glucose-Capillary: 87 mg/dL (ref 70–99)

## 2013-02-10 MED ORDER — ALPRAZOLAM 0.5 MG PO TABS
1.0000 mg | ORAL_TABLET | Freq: Every day | ORAL | Status: DC
  Administered 2013-02-10: 1 mg via ORAL
  Filled 2013-02-10: qty 2

## 2013-02-10 MED ORDER — OXYCODONE HCL 5 MG PO TABS
5.0000 mg | ORAL_TABLET | Freq: Four times a day (QID) | ORAL | Status: DC | PRN
  Administered 2013-02-10: 5 mg via ORAL
  Filled 2013-02-10: qty 1

## 2013-02-10 NOTE — Progress Notes (Signed)
Physical Therapy Treatment Patient Details Name: Miranda Cruz MRN: [id number] DOB: 06/10/1930 Today's Date: 02/10/2013 Time: 4098-1191 PT Time Calculation (min): 27 min  PT Assessment / Plan / Recommendation Comments on Treatment Session  Pt making progress towards ambulation and activity goals, however, patient demonstrates significant confusion today.  Pt seeing individuals and carrying on conversations with non-existant people. Patient stating things such as "my husband is in the drawer" and "Let me out of this bathroom so I can drive my car" Alerted nsg, nurse came into room to evaluate and assess patient mental status change. At this time do not feel patient is able to ambulate safely for discharge without hands on assist. Will continue to monitor for progress and change in mental status.    Follow Up Recommendations  SNF;Supervision - Intermittent           Equipment Recommendations  None recommended by PT    Recommendations for Other Services    Frequency Min 3X/week   Plan Discharge plan remains appropriate;Frequency remains appropriate    Precautions / Restrictions Precautions Precautions: Fall Precaution Comments: recent fall 1 week pta Restrictions Weight Bearing Restrictions: No   Pertinent Vitals/Pain NAD    Mobility  Bed Mobility Bed Mobility: Rolling Right;Right Sidelying to Sit;Sitting - Scoot to [place] of Bed Rolling Right: 4: Min assist Right Sidelying to Sit: 4: Min assist Sitting - Scoot to Edge of Bed: 4: Min assist Details for Bed Mobility Assistance: Assist for hips and trunk, patient unable to follow commands for positioning Transfers Transfers: Sit to Stand;Stand to Sit Sit to Stand: 4: Min assist;From chair/3-in-1;With upper extremity assist;With armrests Stand to Sit: 4: Min assist;To chair/3-in-1;With upper extremity assist;With armrests Stand Pivot Transfers: 4: Min assist Details for Transfer Assistance: VCs for hand  placement Ambulation/Gait Ambulation/Gait Assistance: 4: Min assist Ambulation Distance (Feet): 160 Feet Assistive device: Rolling walker Ambulation/Gait Assistance Details: Manual assist for control of rolling walker as well as for patient stability with ambulation.  VCs and manula cues for handplacement and proper use of assistive device Gait Pattern: Step-through pattern;Decreased stride length;Trunk flexed;Wide base of support Gait velocity: decreased Stairs: No    Exercises General Exercises - Lower Extremity Ankle Circles/Pumps: AROM;10 reps;Seated Long Arc Quad: AROM;Strengthening;Both;10 reps;Seated Hip Flexion/Marching: AROM;Strengthening;10 reps;Seated Toe Raises: AROM;10 reps;Seated Heel Raises: AROM;10 reps;Seated Mini-Sqauts: AROM;10 reps;Standing   PT Diagnosis:    PT Problem List:   PT Treatment Interventions:     PT Goals Acute Rehab PT Goals Pt will go Supine/Side to Sit: with supervision;with HOB 0 degrees PT Goal: Supine/Side to Sit - Progress: Progressing toward goal Pt will go Sit to Supine/Side: with supervision;with HOB 0 degrees PT Goal: Sit to Supine/Side - Progress: Progressing toward goal Pt will go Sit to Stand: with supervision;with upper extremity assist PT Goal: Sit to Stand - Progress: Progressing toward goal Pt will Transfer Bed to Chair/Chair to Bed: with supervision PT Transfer Goal: Bed to Chair/Chair to Bed - Progress: Progressing toward goal Pt will Ambulate: 51 - 150 feet;with supervision;with rolling walker PT Goal: Ambulate - Progress: Progressing toward goal  Visit Information  Last PT Received On: 02/10/13 Assistance Needed: +1    Subjective Data  Subjective: Pt very confused; not making sense with conversation Patient Stated Goal: to return home   Cognition  Cognition Arousal/Alertness: Awake/alert Behavior During Therapy: WFL for tasks assessed/performed Overall Cognitive Status: Within Functional Limits for tasks assessed     Balance  Static Sitting Balance Static Sitting - Balance Support: No  upper extremity supported;Feet supported Static Sitting - Level of Assistance: 7: Independent Static Sitting - Comment/# of Minutes: ther ex performed EOB with patient; apporximately 10 minutes of independent sitting  End of Session PT - End of Session Equipment Utilized During Treatment: Gait belt Activity Tolerance: Other (comment) (Pt limited by confusion) Patient left: in chair;with call bell/phone within reach;with chair alarm set;with nursing in room Nurse Communication: Mobility status;Other (comment) (patient extremely confused)   GP     Fabio Asa 02/10/2013, 2:34 PM Charlotte Crumb, PT DPT  [phone]

## 2013-02-10 NOTE — Progress Notes (Signed)
Patient ID: Miranda Cruz  female  EAV:[id number]    DOB: [birth year]    DOA: 01/31/2013  PCP: Hoyle Sauer, MD  Assessment/Plan:  Acute encephalopathy: Likely medication effect. Patient was taken off of prednisone which was initially thought to be the culprit. She was restarted on Robaxin and oxycodone (patient was adamant on having her Robaxin at the time and her family reported that she takes oxycodone 10-15 mg every 4 hours and I was concerned about withdrawals.). Today patient noticed to be hallucinating and talking to herself. She received oxycodone last night. - Stat UA showed no UTI - Stat CT head did not show any acute infarct - Patient follows commands. Initially I discontinued oxycodone but that the family reported that she takes it scheduled rather than when necessary, so I replaced it.    ARF (acute renal failure) on CKD (chronic kidney disease) stage 4, GFR 15-29 ml/min - Improving, nephrology service following, continue Lasix  Hemoptysis with bilateral pneumonia: Likely from tracheobronchitis. Hemoptysis resolved - on albuterol nebs, IV Levaquin   -Prednisone was discontinued due to slight confusion yesterday and the family requested prednisone to be discontinued  DM (diabetes mellitus) - Continue sliding scale insulin    Cardiomyopathy, ischemic/  CAD (coronary artery disease): Currently stable  Hyperkalemia, Resolved.   Hypervolemic hyponatremia, resolved   Hypotension/ cardiogenic shock: unclear etiology. Probably from ischemic cardiomyopathy. She is weaned off pressors and was started on hydrocortisone, weaned off.  DVT Prophylaxis: SCDs  Code Status:  Disposition:     Subjective: Still coughing, thick sputum, confused and talking to herself  Objective: Weight change: -0.3 kg (-10.6 oz)  Intake/Output Summary (Last 24 hours) at 02/10/13 1336 Last data filed at 02/10/13 0801  Gross per 24 hour  Intake    360 ml  Output      0 ml  Net    360 ml   Blood  pressure 125/79, pulse 70, temperature 97.7 F (36.5 C), temperature source Oral, resp. rate 18, height 5\' 5"  (1.651 m), weight 82.7 kg (182 lb 5.1 oz), SpO2 96.00%.  Physical Exam: General: Alert and awake, somewhat confused, NAD CVS: S1-S2 clear, no murmur rubs or gallops Chest bilateral rhonchi throughout although somewhat better from the last 2 days Abdomen: soft NT, ND. normal bowel sounds  Extremities: no cyanosis, clubbing,  +edema noted bilaterally   Lab Results: Basic Metabolic Panel:  Recent Labs Lab 02/06/13 0525  02/08/13 0610 02/09/13 1130  NA 142  < > 140 137  K 3.4*  < > 3.8 4.9  CL 94*  < > 97 94*  CO2 34*  < > 31 30  GLUCOSE 144*  < > 136* 206*  BUN 110*  < > 88* 76*  CREATININE 3.09*  < > 2.43* 2.32*  CALCIUM 8.4  < > 9.1 9.6  MG 2.0  --  1.3*  --   PHOS 3.6  --   --   --   < > = values in this interval not displayed. Liver Function Tests:  Recent Labs Lab 02/04/13 0420 02/06/13 0525  ALBUMIN 2.2* 2.5*   No results found for this basename: LIPASE, AMYLASE,  in the last 168 hours No results found for this basename: AMMONIA,  in the last 168 hours CBC:  Recent Labs Lab 02/05/13 0605 02/05/13 1235  WBC 6.9 8.2  NEUTROABS  --  7.0  HGB 10.0* 10.6*  HCT 30.3* 32.3*  MCV 87.3 88.5  PLT 175 170   Cardiac Enzymes: No  results found for this basename: CKTOTAL, CKMB, CKMBINDEX, TROPONINI,  in the last 168 hours BNP: No components found with this basename: POCBNP,  CBG:  Recent Labs Lab 02/09/13 1632 02/09/13 2142 02/10/13 0539 02/10/13 0613 02/10/13 1130  GLUCAP 180* 126* 177* 177* 139*     Micro Results: Recent Results (from the past 240 hour(s))  MRSA PCR SCREENING     Status: None   Collection Time    01/31/13  2:01 PM      Result Value Range Status   MRSA by PCR NEGATIVE  NEGATIVE Final   Comment:            The GeneXpert MRSA Assay (FDA     approved for NASAL specimens     only), is one component of a     comprehensive MRSA  colonization     surveillance program. It is not     intended to diagnose MRSA     infection nor to guide or     monitor treatment for     MRSA infections.  URINE CULTURE     Status: None   Collection Time    01/31/13  4:58 PM      Result Value Range Status   Specimen Description URINE, RANDOM   Final   Special Requests NONE   Final   Culture  Setup Time 01/31/2013 18:45   Final   Colony Count NO GROWTH   Final   Culture NO GROWTH   Final   Report Status 02/01/2013 FINAL   Final  CULTURE, BLOOD (ROUTINE X 2)     Status: None   Collection Time    01/31/13 10:14 PM      Result Value Range Status   Specimen Description BLOOD A-LINE   Final   Special Requests BOTTLES DRAWN AEROBIC ONLY 10CC   Final   Culture  Setup Time 02/01/2013 05:07   Final   Culture NO GROWTH 5 DAYS   Final   Report Status 02/07/2013 FINAL   Final  CULTURE, BLOOD (ROUTINE X 2)     Status: None   Collection Time    02/01/13  1:20 AM      Result Value Range Status   Specimen Description BLOOD LEFT HAND   Final   Special Requests BOTTLES DRAWN AEROBIC ONLY 10CC   Final   Culture  Setup Time 02/01/2013 05:07   Final   Culture NO GROWTH 5 DAYS   Final   Report Status 02/07/2013 FINAL   Final    Studies/Results: Dg Chest 2 View  02/07/2013   *RADIOLOGY REPORT*  Clinical Data: Cough, congestion and weakness  CHEST - 2 VIEW  Comparison: 02/05/2013  Findings: Patchy perihilar airspace opacity is noted extending to the medial lung bases.  This has increased from the prior study mostly in the perihilar regions.  The cardiac silhouette is mildly enlarged.  No mediastinal or hilar masses.  Stable right PICC and left anterior chest wall sequential pacemaker.  IMPRESSION: Increase in parahilar airspace opacities with persistent medial lung base opacity.  This suggests bilateral pneumonia.  It could reflect asymmetric edema.   Original Report Authenticated By: Amie Portland, M.D.   US Renal Port  02/01/2013   *RADIOLOGY  REPORT*  Clinical Data:  Acute renal failure.  RENAL/URINARY TRACT ULTRASOUND COMPLETE  Comparison:  CT of the abdomen and pelvis performed 07/18/2009, and renal ultrasound performed 12/14/2007  Findings:  Right Kidney:  The right kidney measures 10.4 cm in length.  The  kidney demonstrates normal size, echogenicity and configuration. There is mild cortical thinning at the upper pole of the right kidney.  No hydronephrosis or calcification is identified.  No masses are seen.  Left Kidney:  The left kidney measures 8.1 cm in length.  Not well assessed due to limitations in positioning.  The kidney demonstrates grossly normal size, echogenicity and configuration. No significant cortical thinning is seen.  No hydronephrosis or calcification is identified.  No masses are seen.  Bladder:  The bladder is not visualized; the known Foley catheter is not seen.  IMPRESSION: No evidence of hydronephrosis; no acute abnormality seen to explain the patient's renal failure.  Mild cortical thinning at the upper pole of the right kidney; the left kidney is difficult to fully characterize due to limitations in positioning.   Original Report Authenticated By: Tonia Ghent, M.D.   Dg Chest Port 1 View  02/05/2013   *RADIOLOGY REPORT*  Clinical Data: Edema  PORTABLE CHEST - 1 VIEW  Comparison: 02/03/2013  Findings: Right arm PICC and left subclavian pacemaker are stable in position.  Mild cardiomegaly stable.  Atheromatous aorta. Patchy   left infrahilar airspace disease is somewhat more conspicuous.  Question tiny left pleural effusion as before.  IMPRESSION:  1.  Developing left infrahilar airspace disease.   Original Report Authenticated By: D. Andria Rhein, MD   Dg Chest Port 1 View  02/03/2013   *RADIOLOGY REPORT*  Clinical Data: Respiratory failure.  PORTABLE CHEST - 1 VIEW  Comparison: Chest 02/02/2013 and 01/31/2013.  Findings: Right PICC remains in place.  Right pleural effusion and basilar airspace disease appear mildly  improved.  Small left effusion and airspace disease are unchanged.  Heart size is upper normal.  No pneumothorax.  IMPRESSION: Some improvement in a right pleural effusion basilar airspace disease.  No new abnormality.   Original Report Authenticated By: Holley Dexter, M.D.   Dg Chest Port 1 View  02/02/2013   *RADIOLOGY REPORT*  Clinical Data: Evaluate CHF.  PORTABLE CHEST - 1 VIEW  Comparison: 01/31/2013  Findings: Cardiomegaly.  Worsening bilateral perihilar and lower lobe airspace opacities, right greater than left.  This represent asymmetric edema or infection.  Small bilateral pleural effusions, also greater on the right, new since prior study.  Left pacer and right PICC line are unchanged.  No acute bony abnormality.  IMPRESSION: Worsening bilateral airspace opacities and effusions as above. These findings are greater on the right.  This could represent asymmetric edema/CHF or infection.   Original Report Authenticated By: Charlett Nose, M.D.   Dg Chest Port 1 View  01/31/2013   *RADIOLOGY REPORT*  Clinical Data: Evaluate line placement.  PORTABLE CHEST - 1 VIEW  Comparison: Chest x-ray 01/30/2013.  Findings: There is a right upper extremity PICC with tip terminating in the distal superior vena cava. Lung volumes are low. No acute consolidative airspace disease.  No definite pleural effusions.  Cephalization of the pulmonary vasculature, without frank pulmonary edema.  Mild cardiomegaly is unchanged.  Upper mediastinal contours are within normal limits.  Atherosclerosis in the thoracic aorta.  Left-sided pacemaker device in place with lead tips projecting over the expected location of the right ventricle and right atrium.  IMPRESSION: 1.  Interval placement of right upper extremity PICC with tip terminating in the distal superior vena cava. 2.  Cardiomegaly with pulmonary venous congestion, but no frank pulmonary edema. 3.  Atherosclerosis.   Original Report Authenticated By: Trudie Reed, M.D.    Dg Chest [facility]  01/31/2013   *RADIOLOGY REPORT*  Clinical Data: Volume overload, renal failure  PORTABLE CHEST - 1 VIEW  Comparison: 10/11/2008  Findings: The cardiac shadow is enlarged.  A pacing device is again identified.  Mild vascular congestion with mild interstitial edema is seen.  No sizable effusion or infiltrate is noted.  IMPRESSION: Changes consistent with mild congestive failure.   Original Report Authenticated By: Alcide Clever, M.D.   Dg Foot 2 Views Left  02/01/2013   *RADIOLOGY REPORT*  Clinical Data: Second and third toe bruising with left foot swelling.  LEFT FOOT - 2 VIEW  Comparison: None.  Findings: Diffuse soft tissue swelling appears worst along the dorsum of the foot.  Mild degenerative changes at the first metatarsal phalangeal joint with a small erosion along the lateral base of the first proximal phalanx.  No definite acute osseous abnormality.  Prominent calcaneal spurs.  IMPRESSION:  1.  Diffuse soft tissue swelling without definite acute osseous abnormality. 2.  Mild first metatarsal phalangeal joint osteoarthritis with a small erosion along the lateral base of the first proximal phalanx.   Original Report Authenticated By: Leanna Battles, M.D.    Medications: Scheduled Meds: . albuterol  2.5 mg Nebulization BID  . budesonide  0.25 mg Nebulization BID  . feeding supplement  1 Container Oral BID BM  . furosemide  80 mg Oral BID  . guaiFENesin  600 mg Oral BID  . insulin aspart  0-15 Units Subcutaneous TID WC  . insulin aspart  0-5 Units Subcutaneous QHS  . insulin aspart  3 Units Subcutaneous TID WC  . isosorbide mononitrate  30 mg Oral Daily  . levofloxacin (LEVAQUIN) IV  750 mg Intravenous Q48H  . levothyroxine  125 mcg Oral QAC breakfast  . metoprolol tartrate  12.5 mg Oral BID  . potassium chloride  40 mEq Oral Once  . potassium chloride  40 mEq Oral BID  . senna-docusate  2 tablet Oral BID  . sevelamer carbonate  800 mg Oral TID WC  . sodium  chloride  10-40 mL Intracatheter Q12H      LOS: 10 days   RAI,RIPUDEEP M.D. [facility] 02/10/2013, 1:36 PM Pager: [phone]  If 7PM-7AM, please contact night-coverage [email or web address] Password [email or web address]

## 2013-02-10 NOTE — Progress Notes (Signed)
NUTRITION FOLLOW UP  Intervention:    Continue Resource Breeze twice daily (250 kcals, 9 gm protein per 8 fl oz carton) RD to follow for nutrition care plan  Nutrition Dx:   Inadequate oral intake related to "no appetite" as evidenced by weight loss, improving  Goal:   Oral intake with meals & supplements to meet >/= 90% of estimated nutrition needs, progressing  Monitor: PO & supplemental intake, weight, labs, I/O's  Assessment:   Patient not in room upon RD visitation.  CWOCN note reviewed 5/28 ---> groin and abdominal skin folds with red fissures and partial thickness skin loss.  S/p bedside swallow evaluation 5/31 ---> SLP recommending regular, thin liquid diet.  PO intake variable at 0-100% per flowsheet records.  Per MAR, drinking some of her ordered nutrition supplement.  Height: Ht Readings from Last 1 Encounters:  01/31/13 5\' 5"  (1.651 m)    Weight Status:   Wt Readings from Last 1 Encounters:  02/10/13 182 lb 5.1 oz (82.7 kg)    Re-estimated needs:  Kcal: 1700-1900 Protein: 55-65 gm Fluid: 1200 ml  Skin: stage I on sacrum and groin  Diet Order: Carb Control   Intake/Output Summary (Last 24 hours) at 02/10/13 1056 Last data filed at 02/10/13 0801  Gross per 24 hour  Intake    480 ml  Output    200 ml  Net    280 ml    Last BM: 6/4  Labs:   Recent Labs Lab 02/04/13 0420 02/05/13 0605  02/06/13 0525 02/07/13 0950 02/08/13 0610 02/09/13 1130  NA 143 139  --  142 140 140 137  K 3.4* 2.7*  < > 3.4* 3.3* 3.8 4.9  CL 99 95*  --  94* 97 97 94*  CO2 30 31  --  34* 35* 31 30  BUN 126* 117*  --  110* 95* 88* 76*  CREATININE 4.50* 3.75*  --  3.09* 2.56* 2.43* 2.32*  CALCIUM 7.7* 8.1*  --  8.4 8.9 9.1 9.6  MG 1.5 2.2  --  2.0  --  1.3*  --   PHOS 6.5* 4.7*  --  3.6  --   --   --   GLUCOSE 148* 170*  --  144* 140* 136* 206*  < > = values in this interval not displayed.  CBG (last 3)   Recent Labs  02/09/13 2142 02/10/13 0539 02/10/13 0613   GLUCAP 126* 177* 177*    Scheduled Meds: . albuterol  2.5 mg Nebulization BID  . budesonide  0.25 mg Nebulization BID  . feeding supplement  1 Container Oral BID BM  . furosemide  80 mg Oral BID  . guaiFENesin  600 mg Oral BID  . insulin aspart  0-15 Units Subcutaneous TID WC  . insulin aspart  0-5 Units Subcutaneous QHS  . insulin aspart  3 Units Subcutaneous TID WC  . isosorbide mononitrate  30 mg Oral Daily  . levofloxacin (LEVAQUIN) IV  750 mg Intravenous Q48H  . levothyroxine  125 mcg Oral QAC breakfast  . metoprolol tartrate  12.5 mg Oral BID  . potassium chloride  40 mEq Oral Once  . potassium chloride  40 mEq Oral BID  . senna-docusate  2 tablet Oral BID  . sevelamer carbonate  800 mg Oral TID WC  . sodium chloride  10-40 mL Intracatheter Q12H    Continuous Infusions: . sodium chloride 20 mL/hr at 02/08/13 1700    Maureen Chatters, RD, LDN Pager #: [phone] After-Hours  Pager #: [phone]

## 2013-02-11 LAB — URINE CULTURE
Colony Count: NO GROWTH
Special Requests: NORMAL

## 2013-02-11 LAB — GLUCOSE, CAPILLARY: Glucose-Capillary: 128 mg/dL — ABNORMAL HIGH (ref 70–99)

## 2013-02-11 LAB — BASIC METABOLIC PANEL
BUN: 72 mg/dL — ABNORMAL HIGH (ref 6–23)
CO2: 29 mEq/L (ref 19–32)
Calcium: 8.9 mg/dL (ref 8.4–10.5)
Glucose, Bld: 131 mg/dL — ABNORMAL HIGH (ref 70–99)
Potassium: 4.2 mEq/L (ref 3.5–5.1)
Sodium: 136 mEq/L (ref 135–145)

## 2013-02-11 MED ORDER — METOPROLOL TARTRATE 25 MG PO TABS: 12.5000 mg | ORAL_TABLET | Freq: Two times a day (BID) | ORAL | Status: DC

## 2013-02-11 MED ORDER — POTASSIUM CHLORIDE CRYS ER 20 MEQ PO TBCR: 40.0000 meq | EXTENDED_RELEASE_TABLET | Freq: Two times a day (BID) | ORAL | Status: DC

## 2013-02-11 MED ORDER — LEVOFLOXACIN 750 MG PO TABS: 750.0000 mg | ORAL_TABLET | ORAL | Status: DC

## 2013-02-11 MED ORDER — LEVALBUTEROL TARTRATE 45 MCG/ACT IN AERO: 1.0000 | INHALATION_SPRAY | RESPIRATORY_TRACT | Status: DC | PRN

## 2013-02-11 MED ORDER — FUROSEMIDE 80 MG PO TABS: 80.0000 mg | ORAL_TABLET | Freq: Two times a day (BID) | ORAL | Status: DC

## 2013-02-11 MED ORDER — SEVELAMER CARBONATE 800 MG PO TABS: 800.0000 mg | ORAL_TABLET | Freq: Three times a day (TID) | ORAL | Status: DC

## 2013-02-11 MED ORDER — GUAIFENESIN-DM 100-10 MG/5ML PO SYRP: 5.0000 mL | ORAL_SOLUTION | ORAL | Status: DC | PRN

## 2013-02-11 MED ORDER — GUAIFENESIN ER 600 MG PO TB12: 600.0000 mg | ORAL_TABLET | Freq: Two times a day (BID) | ORAL | Status: AC

## 2013-02-11 NOTE — Progress Notes (Signed)
Pt discharge instructions and education completed. PICC line d/c by IV team. Site WNL. Follow up appointments arranged. D/C home with daughter. [name]

## 2013-02-11 NOTE — Discharge Summary (Signed)
Physician Discharge Summary  Patient ID: Miranda Cruz MRN: [id number] DOB/AGE: 1930/08/31 79 y.o.  Admit date: 01/31/2013 Discharge date: 02/11/2013  Primary Care Physician:  Hoyle Sauer, MD  Discharge Diagnoses:    . ARF (acute renal failure) . CKD (chronic kidney disease) stage 4, GFR 15-29 ml/min . DM (diabetes mellitus) . Cardiomyopathy, ischemic . CAD (coronary artery disease) . CAP (community acquired pneumonia) . Hemoptysis  Consults: Nephrology                   Critical care     Allergies:   Allergies  Allergen Reactions  . Morphine And Related     unknown  . Phenergan (Promethazine Hcl)     unknown  . Trazodone And Nefazodone     hallucinations      Discharge Medications:   Medication List    STOP taking these medications       allopurinol 100 MG tablet  Commonly known as:  ZYLOPRIM     lisinopril 5 MG tablet  Commonly known as:  PRINIVIL,ZESTRIL     methocarbamol 500 MG tablet  Commonly known as:  ROBAXIN     oxyCODONE 5 MG immediate release tablet  Commonly known as:  Oxy IR/ROXICODONE      TAKE these medications       ALPRAZolam 1 MG tablet  Commonly known as:  XANAX  Take 1 mg by mouth at bedtime.     atorvastatin 40 MG tablet  Commonly known as:  LIPITOR  Take 40 mg by mouth daily.     fluticasone 50 MCG/ACT nasal spray  Commonly known as:  FLONASE  Place 2 sprays into the nose 2 (two) times daily as needed for rhinitis or allergies.     furosemide 80 MG tablet  Commonly known as:  LASIX  Take 1 tablet (80 mg total) by mouth 2 (two) times daily.     glyBURIDE 2.5 MG tablet  Commonly known as:  DIABETA  Take 2.5 mg by mouth every morning.     guaiFENesin 600 MG 12 hr tablet  Commonly known as:  MUCINEX  Take 1 tablet (600 mg total) by mouth 2 (two) times daily.     guaiFENesin-dextromethorphan 100-10 MG/5ML syrup  Commonly known as:  ROBITUSSIN DM  Take 5 mLs by mouth every 4 (four) hours as needed for cough. ALSO  AVAILABLE OTC     isosorbide mononitrate 30 MG 24 hr tablet  Commonly known as:  IMDUR  Take 30 mg by mouth daily.     levalbuterol 45 MCG/ACT inhaler  Commonly known as:  XOPENEX HFA  Inhale 1-2 puffs into the lungs every 4 (four) hours as needed for wheezing.     levofloxacin 750 MG tablet  Commonly known as:  LEVAQUIN  Take 1 tablet (750 mg total) by mouth every other day. X 5 more doses     levothyroxine 125 MCG tablet  Commonly known as:  SYNTHROID, LEVOTHROID  Take 125 mcg by mouth daily.     metoprolol tartrate 25 MG tablet  Commonly known as:  LOPRESSOR  Take 0.5 tablets (12.5 mg total) by mouth 2 (two) times daily.     niacin 500 MG CR tablet  Commonly known as:  NIASPAN  Take 500 mg by mouth daily.     nitroGLYCERIN 0.4 MG SL tablet  Commonly known as:  NITROSTAT  Place 0.4 mg under the tongue as needed for chest pain.     omega-3 acid ethyl esters  1 G capsule  Commonly known as:  LOVAZA  Take 1 g by mouth daily.     pantoprazole 40 MG tablet  Commonly known as:  PROTONIX  Take 40 mg by mouth daily.     potassium chloride SA 20 MEQ tablet  Commonly known as:  K-DUR,KLOR-CON  Take 2 tablets (40 mEq total) by mouth 2 (two) times daily.     sevelamer carbonate 800 MG tablet  Commonly known as:  RENVELA  Take 1 tablet (800 mg total) by mouth 3 (three) times daily with meals.     Vitamin D (Ergocalciferol) 50000 UNITS Caps  Commonly known as:  DRISDOL  Take 50,000 Units by mouth 3 (three) times a week.         Brief H and P: For complete details please refer to admission H and P, but in brief, Miranda Cruz is a 79 y.o. female with H/o Ischemic cardiomyopathy EF of 40%, CAD, CKD, baseline creatinine 2-2.5, Gout, DM was brought to Dr. Vicente Males office by her daughter today because of lethargy, swelling. She usually lives at home with daughter and she just got back from a family trip [place]. The family thought that she had missed her medicines/lasix for 4-5  days, they deny any NSAID use.  Also reported poor PO intake, worsening lower ext edema, breathing heavier for 4days.  In addition, she fell last week bruising her right anterior chest, Left upper back etc.  No fevers, chills, no vomiting, diarrhea.  Upon evaluation in Dr.Avvas office she was noted to be volume overloaded and Labs revealed creatinine of 6.7, K of 6.5, Co2 of 13 and she was referred for Direct Admission.   Hospital Course:   79 yo with past medical history of CAD, ischemic cardiomyopathy (EF 40%) and CKD (Cr 2.5) admitted 5/27 after found her Cr to be elevated to 6.7 and K elevated to 6.5. Initially DNR / refused dialysis, but code status overturned by family. PCCM was consulted. She underwent US RENAL did not show evidence of hydronephrosis, she was also hypotensive and was transferred to ICU. Patient was started on pressors on admission, they were discontinued on 5/30 after BP parameters normalized. She is deemed stable and transferred to telemetry to medical service on 6/1. Hypotension/ cardiogenic shock: unclear etiology. Probably from ischemic cardiomyopathy. She is weaned off pressors and was started on hydrocortisone, weaned off.   ARF (acute renal failure) on CKD (chronic kidney disease) stage 4, GFR 15-29 ml/min. Creatinine function was 6.63 at the time of admission with BUN of 139 -Renal service was consulted renal ultrasound was done should not show any evidence of hydronephrosis. Patient was not interested in long-term dialysis.  Her kidney function has stabilized at 2.43 at the time of discharge which is closer to her baseline. Continue Lasix, followup with her primary care physician and Dr. Caryn Section for further management    Hemoptysis with bilateral pneumonia: Likely from tracheobronchitis, hemoptysis  has resolved. Patient was placed on Xopenex inhaler, Levaquin. She was placed on prednisone however discontinued due to worsening confusion. She should have a followup chest  x-ray to affirm complete resolution of pneumonia.  Acute encephalopathy: Likely medication effect and hospitalization induced psychosis . Patient was taken off of prednisone which was initially thought to be the culprit. She was restarted on Robaxin and oxycodone (patient was adamant on having her Robaxin at the time and her family reported that she takes oxycodone 10-15 mg every 4 hours and I was concerned about withdrawals).  Patient however worsened and was hallucinating. Patient's daughter who lives with her and does the medications, reported the next day that she does not give Robaxin to the patient as it has caused encephalopathy in the past. UA showed no UTI. Stat CT head did not show any acute infarct   DM (diabetes mellitus) - placed on sliding scale insulin while inpatient.   Cardiomyopathy, ischemic/ CAD (coronary artery disease): Currently stable   Mild Hypervolemic hyponatremia, resolved    Day of Discharge BP 121/74  Pulse 70  Temp(Src) 97.5 F (36.4 C) (Oral)  Resp 16  Ht 5\' 5"  (1.651 m)  Wt 83.9 kg (184 lb 15.5 oz)  BMI 30.78 kg/m2  SpO2 95%  Physical Exam: General: Alert and awake slightly confused  not in any acute distress. CVS: S1-S2 clear no murmur rubs or gallops Chest:  scattered rhonchi, significantly improved from yesterday  Abdomen: soft nontender, nondistended, normal bowel sounds Extremities: no cyanosis, clubbing or edema noted bilaterally   The results of significant diagnostics from this hospitalization (including imaging, microbiology, ancillary and laboratory) are listed below for reference.    LAB RESULTS: Basic Metabolic Panel:  Recent Labs Lab 02/06/13 0525  02/08/13 0610 02/09/13 1130 02/11/13 0630  NA 142  < > 140 137 136  K 3.4*  < > 3.8 4.9 4.2  CL 94*  < > 97 94* 94*  CO2 34*  < > 31 30 29   GLUCOSE 144*  < > 136* 206* 131*  BUN 110*  < > 88* 76* 72*  CREATININE 3.09*  < > 2.43* 2.32* 2.43*  CALCIUM 8.4  < > 9.1 9.6 8.9  MG 2.0   --  1.3*  --   --   PHOS 3.6  --   --   --   --   < > = values in this interval not displayed. Liver Function Tests:  Recent Labs Lab 02/06/13 0525  ALBUMIN 2.5*   CBC:  Recent Labs Lab 02/05/13 0605 02/05/13 1235  WBC 6.9 8.2  NEUTROABS  --  7.0  HGB 10.0* 10.6*  HCT 30.3* 32.3*  MCV 87.3 88.5  PLT 175 170     Recent Labs Lab 02/10/13 2102 02/11/13 0607  GLUCAP 170* 128*    Significant Diagnostic Studies:  US Renal Port  02/01/2013   *RADIOLOGY REPORT*  Clinical Data:  Acute renal failure.  RENAL/URINARY TRACT ULTRASOUND COMPLETE  Comparison:  CT of the abdomen and pelvis performed 07/18/2009, and renal ultrasound performed 12/14/2007  Findings:  Right Kidney:  The right kidney measures 10.4 cm in length.  The kidney demonstrates normal size, echogenicity and configuration. There is mild cortical thinning at the upper pole of the right kidney.  No hydronephrosis or calcification is identified.  No masses are seen.  Left Kidney:  The left kidney measures 8.1 cm in length.  Not well assessed due to limitations in positioning.  The kidney demonstrates grossly normal size, echogenicity and configuration. No significant cortical thinning is seen.  No hydronephrosis or calcification is identified.  No masses are seen.  Bladder:  The bladder is not visualized; the known Foley catheter is not seen.  IMPRESSION: No evidence of hydronephrosis; no acute abnormality seen to explain the patient's renal failure.  Mild cortical thinning at the upper pole of the right kidney; the left kidney is difficult to fully characterize due to limitations in positioning.   Original Report Authenticated By: Tonia Ghent, M.D.   Dg Chest Port 1 View  01/31/2013   *  RADIOLOGY REPORT*  Clinical Data: Evaluate line placement.  PORTABLE CHEST - 1 VIEW  Comparison: Chest x-ray 01/30/2013.  Findings: There is a right upper extremity PICC with tip terminating in the distal superior vena cava. Lung volumes are low.  No acute consolidative airspace disease.  No definite pleural effusions.  Cephalization of the pulmonary vasculature, without frank pulmonary edema.  Mild cardiomegaly is unchanged.  Upper mediastinal contours are within normal limits.  Atherosclerosis in the thoracic aorta.  Left-sided pacemaker device in place with lead tips projecting over the expected location of the right ventricle and right atrium.  IMPRESSION: 1.  Interval placement of right upper extremity PICC with tip terminating in the distal superior vena cava. 2.  Cardiomegaly with pulmonary venous congestion, but no frank pulmonary edema. 3.  Atherosclerosis.   Original Report Authenticated By: Trudie Reed, M.D.   Dg Chest Port 1 View  01/31/2013   *RADIOLOGY REPORT*  Clinical Data: Volume overload, renal failure  PORTABLE CHEST - 1 VIEW  Comparison: 10/11/2008  Findings: The cardiac shadow is enlarged.  A pacing device is again identified.  Mild vascular congestion with mild interstitial edema is seen.  No sizable effusion or infiltrate is noted.  IMPRESSION: Changes consistent with mild congestive failure.   Original Report Authenticated By: Alcide Clever, M.D.   Dg Foot 2 Views Left  02/01/2013   *RADIOLOGY REPORT*  Clinical Data: Second and third toe bruising with left foot swelling.  LEFT FOOT - 2 VIEW  Comparison: None.  Findings: Diffuse soft tissue swelling appears worst along the dorsum of the foot.  Mild degenerative changes at the first metatarsal phalangeal joint with a small erosion along the lateral base of the first proximal phalanx.  No definite acute osseous abnormality.  Prominent calcaneal spurs.  IMPRESSION:  1.  Diffuse soft tissue swelling without definite acute osseous abnormality. 2.  Mild first metatarsal phalangeal joint osteoarthritis with a small erosion along the lateral base of the first proximal phalanx.   Original Report Authenticated By: Leanna Battles, M.D.    2D ECHO: 02/01/2013 Study Conclusions  - Left  ventricle: The cavity size was normal. Systolic function was mildly reduced. The estimated ejection fraction was in the range of 45% to 50%. There is hypokinesis of the anteroseptal myocardium. - Ventricular septum: Abnormal septal motion and thickening, consistent with pacer. - Mitral valve: Calcified annulus. Mild regurgitation. - Left atrium: The atrium was moderately dilated. - Right ventricle: The cavity size was mildly dilated. Wall thickness was normal. - Right atrium: The atrium was mildly dilated. - Tricuspid valve: Moderate regurgitation. - Pulmonary arteries: Systolic pressure was moderately increased. PA peak pressure: 60mm Hg (S).    Disposition and Follow-up:     Discharge Orders   Future Orders Complete By Expires     (HEART FAILURE PATIENTS) Call MD:  Anytime you have any of the following symptoms: 1) 3 pound weight gain in 24 hours or 5 pounds in 1 week 2) shortness of breath, with or without a dry hacking cough 3) swelling in the hands, feet or stomach 4) if you have to sleep on extra pillows at night in order to breathe.  As directed     Diet Carb Modified  As directed     Discharge instructions  As directed     Comments:      1) Please follow-up with Dr Felipa Eth in 1 week, you will need renal function checked. 2) Please review all changes in the medications  3) Check  your blood sugars every morning, discuss with Dr Felipa Eth if you need to be on glyburide. 4) Repeat Chest Xray in 10days-2 weeks for complete resolution of pneumonia.    Increase activity slowly  As directed         DISPOSITION: Home  DIET: Carb modify diet   ACTIVITY: As tolerated  TESTS THAT NEED FOLLOW-UP BMET   DISCHARGE FOLLOW-UP Follow-up Information   Follow up with Hoyle Sauer, MD. Schedule an appointment as soon as possible for a visit in 1 week. (for hospital follow-up, BMET, chest Xray )    Contact information:   [address] [facility] [place]  [address] [phone]       Follow up with Zada Girt, MD. Schedule an appointment as soon as possible for a visit in 10 days. (for follow-up)    Contact information:   [address] [facility] [place] [address] [phone]       Time spent on Discharge: 40 mins  Signed:   Guage Efferson M.D. [facility] 02/11/2013, 8:56 AM Pager: [phone]

## 2013-03-02 ENCOUNTER — Encounter (INDEPENDENT_AMBULATORY_CARE_PROVIDER_SITE_OTHER): Payer: Medicare Other | Admitting: Vascular Surgery

## 2013-03-02 DIAGNOSIS — R609 Edema, unspecified: Secondary | ICD-10-CM

## 2013-04-27 ENCOUNTER — Inpatient Hospital Stay (HOSPITAL_COMMUNITY)
Admission: AD | Admit: 2013-04-27 | Discharge: 2013-05-02 | DRG: 291 | Disposition: A | Discharge status: Home / Self Care | Payer: Medicare Other | Source: Ambulatory Visit | Attending: Internal Medicine | Admitting: Internal Medicine

## 2013-04-27 ENCOUNTER — Encounter (HOSPITAL_COMMUNITY): Payer: Self-pay | Admitting: Internal Medicine

## 2013-04-27 ENCOUNTER — Inpatient Hospital Stay (HOSPITAL_COMMUNITY): Payer: Medicare Other

## 2013-04-27 DIAGNOSIS — I2589 Other forms of chronic ischemic heart disease: Secondary | ICD-10-CM | POA: Diagnosis present

## 2013-04-27 DIAGNOSIS — N179 Acute kidney failure, unspecified: Secondary | ICD-10-CM

## 2013-04-27 DIAGNOSIS — I255 Ischemic cardiomyopathy: Secondary | ICD-10-CM

## 2013-04-27 DIAGNOSIS — I959 Hypotension, unspecified: Secondary | ICD-10-CM

## 2013-04-27 DIAGNOSIS — G8929 Other chronic pain: Secondary | ICD-10-CM | POA: Diagnosis present

## 2013-04-27 DIAGNOSIS — E872 Acidosis, unspecified: Secondary | ICD-10-CM

## 2013-04-27 DIAGNOSIS — I2510 Atherosclerotic heart disease of native coronary artery without angina pectoris: Secondary | ICD-10-CM

## 2013-04-27 DIAGNOSIS — I129 Hypertensive chronic kidney disease with stage 1 through stage 4 chronic kidney disease, or unspecified chronic kidney disease: Secondary | ICD-10-CM | POA: Diagnosis present

## 2013-04-27 DIAGNOSIS — Z7982 Long term (current) use of aspirin: Secondary | ICD-10-CM

## 2013-04-27 DIAGNOSIS — M129 Arthropathy, unspecified: Secondary | ICD-10-CM | POA: Diagnosis present

## 2013-04-27 DIAGNOSIS — M545 Low back pain, unspecified: Secondary | ICD-10-CM | POA: Diagnosis present

## 2013-04-27 DIAGNOSIS — J9601 Acute respiratory failure with hypoxia: Secondary | ICD-10-CM

## 2013-04-27 DIAGNOSIS — Z79899 Other long term (current) drug therapy: Secondary | ICD-10-CM

## 2013-04-27 DIAGNOSIS — E7800 Pure hypercholesterolemia, unspecified: Secondary | ICD-10-CM | POA: Diagnosis present

## 2013-04-27 DIAGNOSIS — G9340 Encephalopathy, unspecified: Secondary | ICD-10-CM

## 2013-04-27 DIAGNOSIS — E8770 Fluid overload, unspecified: Secondary | ICD-10-CM

## 2013-04-27 DIAGNOSIS — E039 Hypothyroidism, unspecified: Secondary | ICD-10-CM | POA: Diagnosis present

## 2013-04-27 DIAGNOSIS — I5023 Acute on chronic systolic (congestive) heart failure: Principal | ICD-10-CM

## 2013-04-27 DIAGNOSIS — J189 Pneumonia, unspecified organism: Secondary | ICD-10-CM

## 2013-04-27 DIAGNOSIS — Z96649 Presence of unspecified artificial hip joint: Secondary | ICD-10-CM

## 2013-04-27 DIAGNOSIS — E875 Hyperkalemia: Secondary | ICD-10-CM

## 2013-04-27 DIAGNOSIS — Z950 Presence of cardiac pacemaker: Secondary | ICD-10-CM

## 2013-04-27 DIAGNOSIS — M109 Gout, unspecified: Secondary | ICD-10-CM | POA: Diagnosis present

## 2013-04-27 DIAGNOSIS — N184 Chronic kidney disease, stage 4 (severe): Secondary | ICD-10-CM

## 2013-04-27 DIAGNOSIS — J90 Pleural effusion, not elsewhere classified: Secondary | ICD-10-CM

## 2013-04-27 DIAGNOSIS — I509 Heart failure, unspecified: Secondary | ICD-10-CM | POA: Diagnosis present

## 2013-04-27 DIAGNOSIS — I252 Old myocardial infarction: Secondary | ICD-10-CM

## 2013-04-27 DIAGNOSIS — E871 Hypo-osmolality and hyponatremia: Secondary | ICD-10-CM

## 2013-04-27 DIAGNOSIS — J9600 Acute respiratory failure, unspecified whether with hypoxia or hypercapnia: Secondary | ICD-10-CM | POA: Diagnosis present

## 2013-04-27 DIAGNOSIS — E119 Type 2 diabetes mellitus without complications: Secondary | ICD-10-CM

## 2013-04-27 DIAGNOSIS — R042 Hemoptysis: Secondary | ICD-10-CM

## 2013-04-27 HISTORY — DX: Pleural effusion, not elsewhere classified: J90

## 2013-04-27 HISTORY — DX: Pure hypercholesterolemia, unspecified: E78.00

## 2013-04-27 HISTORY — DX: Unspecified osteoarthritis, unspecified site: M19.90

## 2013-04-27 HISTORY — DX: Low back pain, unspecified: M54.50

## 2013-04-27 HISTORY — DX: Low back pain: M54.5

## 2013-04-27 HISTORY — DX: Shortness of breath: R06.02

## 2013-04-27 HISTORY — DX: Personal history of other medical treatment: Z92.89

## 2013-04-27 HISTORY — DX: Gout, unspecified: M10.9

## 2013-04-27 HISTORY — DX: Type 2 diabetes mellitus without complications: E11.9

## 2013-04-27 HISTORY — DX: Acute myocardial infarction, unspecified: I21.9

## 2013-04-27 HISTORY — DX: Other chronic pain: G89.29

## 2013-04-27 HISTORY — DX: Presence of cardiac pacemaker: Z95.0

## 2013-04-27 HISTORY — DX: Hypothyroidism, unspecified: E03.9

## 2013-04-27 HISTORY — DX: Essential (primary) hypertension: I10

## 2013-04-27 HISTORY — DX: Pneumonia, unspecified organism: J18.9

## 2013-04-27 HISTORY — DX: Chronic kidney disease, stage 4 (severe): N18.4

## 2013-04-27 HISTORY — DX: Heart failure, unspecified: I50.9

## 2013-04-27 LAB — COMPREHENSIVE METABOLIC PANEL
AST: 29 U/L (ref 0–37)
Albumin: 2.9 g/dL — ABNORMAL LOW (ref 3.5–5.2)
Alkaline Phosphatase: 122 U/L — ABNORMAL HIGH (ref 39–117)
BUN: 33 mg/dL — ABNORMAL HIGH (ref 6–23)
Potassium: 3.9 mEq/L (ref 3.5–5.1)
Sodium: 137 mEq/L (ref 135–145)
Total Protein: 6.6 g/dL (ref 6.0–8.3)

## 2013-04-27 LAB — CBC
HCT: 41.4 % (ref 36.0–46.0)
MCHC: 32.9 g/dL (ref 30.0–36.0)
Platelets: 220 10*3/uL (ref 150–400)
RDW: 14 % (ref 11.5–15.5)

## 2013-04-27 LAB — PROCALCITONIN: Procalcitonin: <0.1 ng/mL

## 2013-04-27 LAB — PROTIME-INR: Prothrombin Time: 14.6 seconds (ref 11.6–15.2)

## 2013-04-27 LAB — PRO B NATRIURETIC PEPTIDE: Pro B Natriuretic peptide (BNP): 6703 pg/mL — ABNORMAL HIGH (ref 0–450)

## 2013-04-27 MED ORDER — NITROGLYCERIN 0.4 MG SL SUBL: 0.4000 mg | SUBLINGUAL_TABLET | SUBLINGUAL | Status: DC | PRN

## 2013-04-27 MED ORDER — GUAIFENESIN ER 600 MG PO TB12
600.0000 mg | ORAL_TABLET | Freq: Two times a day (BID) | ORAL | Status: DC
  Administered 2013-04-27: 600 mg via ORAL
  Filled 2013-04-27 (×3): qty 1

## 2013-04-27 MED ORDER — LEVALBUTEROL HCL 0.63 MG/3ML IN NEBU
0.6300 mg | INHALATION_SOLUTION | Freq: Four times a day (QID) | RESPIRATORY_TRACT | Status: DC
  Administered 2013-04-27: 0.63 mg via RESPIRATORY_TRACT
  Filled 2013-04-27: qty 3

## 2013-04-27 MED ORDER — OXYCODONE HCL 5 MG PO TABS
10.0000 mg | ORAL_TABLET | Freq: Three times a day (TID) | ORAL | Status: DC | PRN
  Administered 2013-04-27 – 2013-04-28 (×2): 10 mg via ORAL
  Administered 2013-04-29: 5 mg via ORAL
  Administered 2013-04-29 – 2013-05-01 (×5): 10 mg via ORAL
  Filled 2013-04-27 (×5): qty 2
  Filled 2013-04-27: qty 1
  Filled 2013-04-27 (×2): qty 2

## 2013-04-27 MED ORDER — ALLOPURINOL 100 MG PO TABS
100.0000 mg | ORAL_TABLET | Freq: Every day | ORAL | Status: DC
  Administered 2013-04-28 – 2013-05-02 (×5): 100 mg via ORAL
  Filled 2013-04-27 (×5): qty 1

## 2013-04-27 MED ORDER — METOPROLOL TARTRATE 12.5 MG HALF TABLET
12.5000 mg | ORAL_TABLET | Freq: Two times a day (BID) | ORAL | Status: DC
  Administered 2013-04-27 – 2013-04-30 (×6): 12.5 mg via ORAL
  Filled 2013-04-27 (×7): qty 1

## 2013-04-27 MED ORDER — LEVALBUTEROL HCL 0.63 MG/3ML IN NEBU
0.6300 mg | INHALATION_SOLUTION | Freq: Three times a day (TID) | RESPIRATORY_TRACT | Status: DC
  Administered 2013-04-28 – 2013-04-29 (×3): 0.63 mg via RESPIRATORY_TRACT
  Filled 2013-04-27 (×9): qty 3

## 2013-04-27 MED ORDER — INSULIN ASPART 100 UNIT/ML ~~LOC~~ SOLN
0.0000 [IU] | Freq: Three times a day (TID) | SUBCUTANEOUS | Status: DC
  Administered 2013-04-28 (×2): 1 [IU] via SUBCUTANEOUS
  Administered 2013-04-28: 2 [IU] via SUBCUTANEOUS
  Administered 2013-04-29 – 2013-05-01 (×2): 1 [IU] via SUBCUTANEOUS
  Administered 2013-05-01: 2 [IU] via SUBCUTANEOUS
  Administered 2013-05-02: 1 [IU] via SUBCUTANEOUS

## 2013-04-27 MED ORDER — ATORVASTATIN CALCIUM 40 MG PO TABS
40.0000 mg | ORAL_TABLET | Freq: Every day | ORAL | Status: DC
  Administered 2013-04-27 – 2013-05-01 (×5): 40 mg via ORAL
  Filled 2013-04-27 (×6): qty 1

## 2013-04-27 MED ORDER — GUAIFENESIN-DM 100-10 MG/5ML PO SYRP: 5.0000 mL | ORAL_SOLUTION | ORAL | Status: DC | PRN

## 2013-04-27 MED ORDER — ONDANSETRON HCL 4 MG PO TABS: 4.0000 mg | ORAL_TABLET | Freq: Four times a day (QID) | ORAL | Status: DC | PRN

## 2013-04-27 MED ORDER — ACETAMINOPHEN 325 MG PO TABS: 650.0000 mg | ORAL_TABLET | Freq: Four times a day (QID) | ORAL | Status: DC | PRN

## 2013-04-27 MED ORDER — LEVALBUTEROL HCL 0.63 MG/3ML IN NEBU: 0.6300 mg | INHALATION_SOLUTION | Freq: Four times a day (QID) | RESPIRATORY_TRACT | Status: DC | PRN

## 2013-04-27 MED ORDER — VITAMIN D (ERGOCALCIFEROL) 1.25 MG (50000 UNIT) PO CAPS
50000.0000 [IU] | ORAL_CAPSULE | ORAL | Status: DC
  Administered 2013-04-28: 50000 [IU] via ORAL
  Filled 2013-04-27 (×4): qty 1

## 2013-04-27 MED ORDER — PNEUMOCOCCAL VAC POLYVALENT 25 MCG/0.5ML IJ INJ
0.5000 mL | INJECTION | INTRAMUSCULAR | Status: AC
  Filled 2013-04-27: qty 0.5

## 2013-04-27 MED ORDER — LEVOFLOXACIN 750 MG PO TABS
750.0000 mg | ORAL_TABLET | Freq: Once | ORAL | Status: AC
  Administered 2013-04-27: 750 mg via ORAL
  Filled 2013-04-27: qty 1

## 2013-04-27 MED ORDER — FUROSEMIDE 80 MG PO TABS
80.0000 mg | ORAL_TABLET | Freq: Two times a day (BID) | ORAL | Status: DC
  Administered 2013-04-27 – 2013-04-30 (×6): 80 mg via ORAL
  Filled 2013-04-27 (×8): qty 1

## 2013-04-27 MED ORDER — ISOSORBIDE MONONITRATE ER 30 MG PO TB24
30.0000 mg | ORAL_TABLET | Freq: Every day | ORAL | Status: DC
  Administered 2013-04-27 – 2013-05-02 (×6): 30 mg via ORAL
  Filled 2013-04-27 (×6): qty 1

## 2013-04-27 MED ORDER — ALPRAZOLAM 0.25 MG PO TABS
1.0000 mg | ORAL_TABLET | Freq: Every day | ORAL | Status: DC
  Administered 2013-04-27 – 2013-05-01 (×5): 1 mg via ORAL
  Filled 2013-04-27 (×2): qty 4
  Filled 2013-04-27: qty 3
  Filled 2013-04-27: qty 1
  Filled 2013-04-27 (×2): qty 4

## 2013-04-27 MED ORDER — ONDANSETRON HCL 4 MG/2ML IJ SOLN: 4.0000 mg | Freq: Four times a day (QID) | INTRAMUSCULAR | Status: DC | PRN

## 2013-04-27 MED ORDER — FLUTICASONE PROPIONATE 50 MCG/ACT NA SUSP
2.0000 | Freq: Two times a day (BID) | NASAL | Status: DC | PRN
  Filled 2013-04-27: qty 16

## 2013-04-27 MED ORDER — LEVOTHYROXINE SODIUM 125 MCG PO TABS
125.0000 ug | ORAL_TABLET | Freq: Every day | ORAL | Status: DC
  Administered 2013-04-28 – 2013-05-02 (×5): 125 ug via ORAL
  Filled 2013-04-27 (×6): qty 1

## 2013-04-27 MED ORDER — HEPARIN SODIUM (PORCINE) 5000 UNIT/ML IJ SOLN
5000.0000 [IU] | Freq: Three times a day (TID) | INTRAMUSCULAR | Status: DC
  Administered 2013-04-27 – 2013-05-02 (×12): 5000 [IU] via SUBCUTANEOUS
  Filled 2013-04-27 (×17): qty 1

## 2013-04-27 MED ORDER — LEVOFLOXACIN 500 MG PO TABS
500.0000 mg | ORAL_TABLET | ORAL | Status: DC
  Administered 2013-04-29 – 2013-05-01 (×2): 500 mg via ORAL
  Filled 2013-04-27 (×2): qty 1

## 2013-04-27 MED ORDER — SODIUM CHLORIDE 0.9 % IJ SOLN
3.0000 mL | Freq: Two times a day (BID) | INTRAMUSCULAR | Status: DC
  Administered 2013-04-27 – 2013-05-01 (×6): 3 mL via INTRAVENOUS

## 2013-04-27 MED ORDER — SEVELAMER CARBONATE 800 MG PO TABS
800.0000 mg | ORAL_TABLET | Freq: Two times a day (BID) | ORAL | Status: DC
  Administered 2013-04-28 – 2013-05-02 (×9): 800 mg via ORAL
  Filled 2013-04-27 (×11): qty 1

## 2013-04-27 MED ORDER — ASPIRIN EC 81 MG PO TBEC
81.0000 mg | DELAYED_RELEASE_TABLET | Freq: Every day | ORAL | Status: DC
  Administered 2013-04-27 – 2013-05-01 (×5): 81 mg via ORAL
  Filled 2013-04-27 (×6): qty 1

## 2013-04-27 MED ORDER — INSULIN ASPART 100 UNIT/ML ~~LOC~~ SOLN: 0.0000 [IU] | Freq: Every day | SUBCUTANEOUS | Status: DC

## 2013-04-27 MED ORDER — ACETAMINOPHEN 650 MG RE SUPP: 650.0000 mg | Freq: Four times a day (QID) | RECTAL | Status: DC | PRN

## 2013-04-27 MED ORDER — PANTOPRAZOLE SODIUM 40 MG PO TBEC
40.0000 mg | DELAYED_RELEASE_TABLET | Freq: Every day | ORAL | Status: DC
  Administered 2013-04-28 – 2013-05-02 (×5): 40 mg via ORAL
  Filled 2013-04-27 (×5): qty 1

## 2013-04-27 MED ORDER — OXYCODONE HCL 5 MG PO CAPS: 10.0000 mg | ORAL_CAPSULE | Freq: Three times a day (TID) | ORAL | Status: DC | PRN

## 2013-04-27 NOTE — H&P (Signed)
[facility] History and Physical  Miranda Cruz ZOX:[id number] DOB: 1930-07-12 DOA: 04/27/2013  Referring physician: Rickard Rhymes PCP: Hoyle Sauer, MD  Specialists:  Cardiology: Dr. Donato Schultz.  Chief Complaint: Worsening cough and dyspnea  HPI: Miranda Cruz is a 79 y.o. female with history of CAD, ischemic cardiomyopathy, EF of 45% on TTE 02/01/13, PPM, stage IV chronic kidney disease with baseline creatinine 2-2.5, gout, type II DM, recent discharge from hospital on 02/11/2013 at which time she was admitted for acute on chronic renal failure, bilateral pneumonia, cardiogenic shock & acute encephalopathy. She was sent directly from PCPs office today due to worsening cough and dyspnea. She was also found to be hypoxic in the PCPs office. Patient gives approximately one-week history of cough with intermittent white sputum, running nose but no chest pains, fever, chills. Initially this was associated with mild dyspnea. She was started on a course of oral Levaquin on 8/19 for presumed sinusitis. However over the course of the last couple of days, her cough and dyspnea have progressively gotten worse. She had a followup appointment with her PCP tomorrow but went back to the office today do to worsening symptoms. She was found to have oxygen saturation of 82% on room air with activity that improved to 98% on 2 L of oxygen via nasal cannula. Chest x-ray was reported to have moderate sized right pleural effusion. She was sent to the hospital for further evaluation and management. She also gives history of poor appetite and has lost some weight over the last couple of weeks.   Review of Systems: All systems reviewed and apart from history of presenting illness, are negative    History reviewed. No pertinent past medical history. History reviewed. No pertinent past surgical history. Social History:  reports that she has never smoked. She does not have any smokeless tobacco history on file. Her  alcohol and drug histories are not on file. Patient lives with her daughter. Ambulates at times with the help of a cane. Not on home oxygen.  Allergies  Allergen Reactions  . Morphine And Related Other (See Comments)    Made her loopy  . Phenergan [Promethazine Hcl] Other (See Comments)    Made her crazy  . Prednisone Other (See Comments)    Confusion, bad dreams, insomnia  . Trazodone And Nefazodone Other (See Comments)    hallucinations     No family history on file. father died of renal failure. Mother died of appendicitis. A brother died of stroke. 2 siblings with history of diabetes, CAD and hypertension.  Prior to Admission medications   Medication Sig Start Date End Date Taking? Authorizing Provider  acetaminophen (TYLENOL) 500 MG tablet Take 1,000 mg by mouth daily as needed for pain.   Yes Historical Provider, MD  allopurinol (ZYLOPRIM) 100 MG tablet Take 100 mg by mouth daily.   Yes Historical Provider, MD  ALPRAZolam Prudy Feeler) 1 MG tablet Take 1 mg by mouth at bedtime.   Yes Historical Provider, MD  aspirin EC 81 MG tablet Take 81 mg by mouth at bedtime.   Yes Historical Provider, MD  atorvastatin (LIPITOR) 40 MG tablet Take 40 mg by mouth at bedtime.    Yes Historical Provider, MD  fluticasone (FLONASE) 50 MCG/ACT nasal spray Place 2 sprays into the nose 2 (two) times daily as needed for rhinitis or allergies.   Yes Historical Provider, MD  furosemide (LASIX) 80 MG tablet Take 1 tablet (80 mg total) by mouth 2 (two) times daily. 02/11/13  Yes Ripudeep Jenna Luo, MD  glyBURIDE (DIABETA) 2.5 MG tablet Take 1.25 mg by mouth daily as needed (if sugar is over 120).   Yes Historical Provider, MD  guaiFENesin (MUCINEX) 600 MG 12 hr tablet Take 1 tablet (600 mg total) by mouth 2 (two) times daily. 02/11/13  Yes Ripudeep Jenna Luo, MD  isosorbide mononitrate (IMDUR) 30 MG 24 hr tablet Take 30 mg by mouth daily.   Yes Historical Provider, MD  levalbuterol [facility] HFA) 45 MCG/ACT inhaler Inhale 1-2  puffs into the lungs 3 (three) times daily as needed for wheezing or shortness of breath. 02/11/13  Yes Ripudeep Jenna Luo, MD  levofloxacin (LEVAQUIN) 750 MG tablet Take 750 mg by mouth daily. 7 day course started 04/25/2013   Yes Historical Provider, MD  levothyroxine (SYNTHROID, LEVOTHROID) 125 MCG tablet Take 125 mcg by mouth daily.   Yes Historical Provider, MD  Menthol, Topical Analgesic, (BENGAY EX) Apply 1 application topically daily as needed (hand pain).   Yes Historical Provider, MD  metoprolol tartrate (LOPRESSOR) 25 MG tablet Take 0.5 tablets (12.5 mg total) by mouth 2 (two) times daily. 02/11/13  Yes Ripudeep Jenna Luo, MD  nitroGLYCERIN (NITROSTAT) 0.4 MG SL tablet Place 0.4 mg under the tongue as needed for chest pain.   Yes Historical Provider, MD  omega-3 acid ethyl esters (LOVAZA) 1 G capsule Take 1 g by mouth daily as needed (constipation).    Yes Historical Provider, MD  oxycodone (OXY-IR) 5 MG capsule Take 10 mg by mouth 3 (three) times daily as needed for pain.   Yes Historical Provider, MD  pantoprazole (PROTONIX) 40 MG tablet Take 40 mg by mouth daily.   Yes Historical Provider, MD  Pseudoeph-Doxylamine-DM-APAP (NYQUIL PO) Take by mouth at bedtime as needed (cough). Small capful   Yes Historical Provider, MD  Pseudoephedrine-APAP-DM (DAYQUIL PO) Take by mouth daily as needed (cough). Small capful   Yes Historical Provider, MD  sevelamer carbonate (RENVELA) 800 MG tablet Take 800 mg by mouth 2 (two) times daily. Breakfast and dinner   Yes Historical Provider, MD  Vitamin D, Ergocalciferol, (DRISDOL) 50000 UNITS CAPS Take 50,000 Units by mouth 3 (three) times a week. Approximately every other day   Yes Historical Provider, MD   Physical Exam: Filed Vitals:   04/27/13 1641  BP: 149/81  Pulse: 70  Temp: 97.5 F (36.4 C)  TempSrc: Oral  Height: 5\' 4"  (1.626 m)  Weight: 76.66 kg (169 lb 0.1 oz)  SpO2: 100%     General exam: Moderately built and nourished female patient, lying  comfortably propped up in bed in no obvious distress.  Head, eyes and ENT: Nontraumatic and normocephalic. Pupils equally reacting to light and accommodation. Oral mucosa moist.  Neck: Supple. No JVD, carotid bruit or thyromegaly.  Lymphatics: No lymphadenopathy.  Respiratory system: Reduced breath sounds bibasally right >left. Occasional left basal crackles. Bilateral occasional posterior rhonchi. Clear anteriorly. No increased work of breathing. Able to speak in full sentences.  Cardiovascular system: S1 and S2 heard, RRR. No JVD, murmurs, gallops, clicks or pedal edema. Telemetry: Paced rhythm.  Gastrointestinal system: Abdomen is nondistended, soft and nontender. Normal bowel sounds heard. No organomegaly or masses appreciated.  Central nervous system: Alert and oriented. No focal neurological deficits.  Extremities: Symmetric 5 x 5 power. Peripheral pulses symmetrically felt.  Skin: No rashes or acute findings.  Musculoskeletal system: Negative exam.  Psychiatry: Pleasant and cooperative.   Labs on Admission:  Basic Metabolic Panel: No results found for this  basename: NA, K, CL, CO2, GLUCOSE, BUN, CREATININE, CALCIUM, MG, PHOS,  in the last 168 hours Liver Function Tests: No results found for this basename: AST, ALT, ALKPHOS, BILITOT, PROT, ALBUMIN,  in the last 168 hours No results found for this basename: LIPASE, AMYLASE,  in the last 168 hours No results found for this basename: AMMONIA,  in the last 168 hours CBC: No results found for this basename: WBC, NEUTROABS, HGB, HCT, MCV, PLT,  in the last 168 hours Cardiac Enzymes: No results found for this basename: CKTOTAL, CKMB, CKMBINDEX, TROPONINI,  in the last 168 hours  BNP (last 3 results) No results found for this basename: PROBNP,  in the last 8760 hours CBG: No results found for this basename: GLUCAP,  in the last 168 hours  Radiological Exams on Admission: No results found.  EKG: Independently reviewed. V.  paced rhythm at 70 beats per minute.  Assessment/Plan Principal Problem:   Pleural effusion - right Active Problems:   CKD (chronic kidney disease) stage 4, GFR 15-29 ml/min   DM (diabetes mellitus)   Cardiomyopathy, ischemic   CAD (coronary artery disease)   Acute respiratory failure with hypoxia   1. Right pleural effusion: DD-parapneumonic effusion from recent bout of pneumonia, CHF (although patient does not look overtly volume overloaded) versus other etiology. No labs or imaging available and hence we'll repeat chest x-ray 2 view, basic labs. Clinical picture not suggestive of acute infectious process-no fever and does not look septic/toxic. For now continue empiric Levaquin started OP. After reviewing chest x-ray, will consider therapeutic and diagnostic right thoracentesis by interventional radiology in a.m. Continue oral Lasix. Patient appears quite comfortable. 2. Acute hypoxic respiratory failure: Secondary to problem #1. Management as above. Continue oxygen and bronchodilator nebulizers. 3. Type II DM: Hold oral medications. Place on SSI. 4. Stage IV chronic kidney disease: Followup BMP. No recent creatinine since last hospital discharge. 5. Chronic systolic CHF/ischemic cardiomyopathy/PPM/CAD: Seems stable. Continue aspirin, beta blockers, Lasix, nitrates, statins.     Code Status: Full  Family Communication: Discussed with son/HCPOA, daughter, son-in-law & daughter-in-law at bedside.  Disposition Plan: Home when medically stable.   Time spent: 65 minutes.  [facility] Pager [phone]  If 7PM-7AM, please contact night-coverage [email or web address] Password [facility] 04/27/2013, 6:47 PM

## 2013-04-28 ENCOUNTER — Inpatient Hospital Stay (HOSPITAL_COMMUNITY): Payer: Medicare Other

## 2013-04-28 DIAGNOSIS — J189 Pneumonia, unspecified organism: Secondary | ICD-10-CM

## 2013-04-28 DIAGNOSIS — I2510 Atherosclerotic heart disease of native coronary artery without angina pectoris: Secondary | ICD-10-CM

## 2013-04-28 HISTORY — PX: THORACENTESIS: SHX235

## 2013-04-28 LAB — BODY FLUID CELL COUNT WITH DIFFERENTIAL
Eos, Fluid: 0 %
Monocyte-Macrophage-Serous Fluid: 27 % — ABNORMAL LOW (ref 50–90)
Other Cells, Fluid: 0 %

## 2013-04-28 LAB — GLUCOSE, CAPILLARY
Glucose-Capillary: 154 mg/dL — ABNORMAL HIGH (ref 70–99)
Glucose-Capillary: 174 mg/dL — ABNORMAL HIGH (ref 70–99)

## 2013-04-28 LAB — PROTEIN, BODY FLUID: Total protein, fluid: 3.3 g/dL

## 2013-04-28 LAB — GLUCOSE, SEROUS FLUID: Glucose, Fluid: 173 mg/dL

## 2013-04-28 MED ORDER — DIPHENHYDRAMINE HCL 25 MG PO CAPS
25.0000 mg | ORAL_CAPSULE | Freq: Once | ORAL | Status: AC
  Administered 2013-04-28: 25 mg via ORAL
  Filled 2013-04-28: qty 1

## 2013-04-28 MED ORDER — DM-GUAIFENESIN ER 30-600 MG PO TB12
1.0000 | ORAL_TABLET | Freq: Two times a day (BID) | ORAL | Status: DC
  Administered 2013-04-28 – 2013-05-02 (×9): 1 via ORAL
  Filled 2013-04-28 (×11): qty 1

## 2013-04-28 NOTE — Procedures (Signed)
US guided diagnostic/therapeutic right thoracentesis performed yielding 1.5 liters yellow fluid. The fluid was sent to the lab for preordered studies. F/u CXR pending. No immediate complications.

## 2013-04-28 NOTE — Progress Notes (Signed)
Utilization Review Completed.Medard Decuir T8/22/2014  

## 2013-04-28 NOTE — Progress Notes (Signed)
[facility] PROGRESS NOTE  Miranda Cruz ZOX:[id number] DOB: [birth year] DOA: 04/27/2013 PCP: Hoyle Sauer, MD  Assessment/Plan: Pleural effusion - right  parapneumonic effusion from recent bout of pneumonia vs CHF  -continue levaquin -BNP is elevated, continue diuresis and obtain echo -await paracentesis ordered for today Active Problems:  Acute respiratory failure with hypoxia Due to  #1, Management as above. Continue oxygen and bronchodilator nebulizers. CKD (chronic kidney disease) stage 4, GFR 15-29 ml/min last cr 02/2013 was 2.43 DM (diabetes mellitus) -ok BG control on SSI Cardiomyopathy, ischemic  CAD (coronary artery disease)  -chest pain free, continue out pt meds    Code Status: FULL Family Communication: NONE at bedside Disposition Plan: pending clinical course   Consultants:  none  Procedures:  paracentesis  Antibiotics:  levaquin  HPI/Subjective: +non productive cough and some SOB  Objective: Filed Vitals:   04/28/13 0441  BP: 126/71  Pulse: 70  Temp: 98 F (36.7 C)  Resp: 20    Intake/Output Summary (Last 24 hours) at 04/28/13 0954 Last data filed at 04/28/13 0700  Gross per 24 hour  Intake    120 ml  Output    450 ml  Net   -330 ml   Filed Weights   04/27/13 1641 04/28/13 0441  Weight: 76.66 kg (169 lb 0.1 oz) 75.6 kg (166 lb 10.7 oz)   Exam:  General: alert & oriented x 3In NAD Cardiovascular: RRR, nl S1 s2 Respiratory: Decreased BS at bases R>L, occasional wheeze Abdomen: soft +BS NT/ND, no masses palpable Extremities: No cyanosis and no edema    Data Reviewed: Basic Metabolic Panel:  Recent Labs Lab 04/27/13 2018  NA 137  K 3.9  CL 95*  CO2 34*  GLUCOSE 126*  BUN 33*  CREATININE 1.36*  CALCIUM 9.6   Liver Function Tests:  Recent Labs Lab 04/27/13 2018  AST 29  ALT 9  ALKPHOS 122*  BILITOT 0.5  PROT 6.6  ALBUMIN 2.9*   No results found for this basename: LIPASE, AMYLASE,  in the last 168  hours No results found for this basename: AMMONIA,  in the last 168 hours CBC:  Recent Labs Lab 04/27/13 2018  WBC 10.6*  HGB 13.6  HCT 41.4  MCV 91.6  PLT 220   Cardiac Enzymes: No results found for this basename: CKTOTAL, CKMB, CKMBINDEX, TROPONINI,  in the last 168 hours BNP (last 3 results)  Recent Labs  04/27/13 2019  PROBNP 6703.0*   CBG:  Recent Labs Lab 04/27/13 1957 04/28/13 0615  GLUCAP 121* 123*    No results found for this or any previous visit (from the past 240 hour(s)).   Studies: Dg Chest 2 View  04/27/2013   *RADIOLOGY REPORT*  Clinical Data: Dyspnea, pleural effusion  CHEST - 2 VIEW  Comparison: 02/07/2013  Findings: Left subclavian pacemaker stable.  Cardiomegaly stable. There is a new moderate right pleural effusion with significant atelectasis/consolidation in the right lower lobe.  There may be tiny left pleural effusion atheromatous aortic arch. Perihilar interstitial infiltrates or edema.  IMPRESSION:  1.  New moderate right pleural effusion with right lower lung consolidation / atelectasis.   Original Report Authenticated By: D. Andria Rhein, MD    Scheduled Meds: . allopurinol  100 mg Oral Daily  . ALPRAZolam  1 mg Oral QHS  . aspirin EC  81 mg Oral QHS  . atorvastatin  40 mg Oral QHS  . furosemide  80 mg Oral BID  . guaiFENesin  600 mg Oral  BID  . heparin  5,000 Units Subcutaneous Q8H  . insulin aspart  0-5 Units Subcutaneous QHS  . insulin aspart  0-9 Units Subcutaneous TID WC  . isosorbide mononitrate  30 mg Oral Daily  . levalbuterol  0.63 mg Nebulization TID  . [START ON 04/29/2013] levofloxacin  500 mg Oral Q48H  . levothyroxine  125 mcg Oral QAC breakfast  . metoprolol tartrate  12.5 mg Oral BID  . pantoprazole  40 mg Oral Daily  . pneumococcal 23 valent vaccine  0.5 mL Intramuscular Tomorrow-1000  . sevelamer carbonate  800 mg Oral BID WC  . sodium chloride  3 mL Intravenous Q12H  . Vitamin D (Ergocalciferol)  50,000 Units Oral  3 times weekly   Continuous Infusions:   Principal Problem:   Pleural effusion - right Active Problems:   CKD (chronic kidney disease) stage 4, GFR 15-29 ml/min   DM (diabetes mellitus)   Cardiomyopathy, ischemic   CAD (coronary artery disease)   Acute respiratory failure with hypoxia    Time spent: >52mins    [facility] C  [facility] Pager [phone]. If 7PM-7AM, please contact night-coverage at [email or web address], password [facility] 04/28/2013, 9:54 AM  LOS: 1 day

## 2013-04-28 NOTE — Care Management Note (Signed)
    Page 1 of 1   05/02/2013     4:18:08 PM   CARE MANAGEMENT NOTE 05/02/2013  Patient:  Miranda Cruz, Miranda Cruz   Account Number:  [id number]  Date Initiated:  04/28/2013  Documentation initiated by:  Shereta Crothers  Subjective/Objective Assessment:   PT ADM ON 04/27/13 WITH SOB, RT PLEURAL EFFUSION.  PTA, PT INDEPENDENT, LIVES WITH DAUGHTER.     Action/Plan:   WILL FOLLOW FOR HOME NEEDS AS PT PROGRESSES.   Anticipated DC Date:  05/02/2013   Anticipated DC Plan:  HOME/SELF CARE      DC Planning Services  CM consult      Choice offered to / List presented to:             Status of service:  Completed, signed off Medicare Important Message given?   (If response is "NO", the following Medicare IM given date fields will be blank) Date Medicare IM given:   Date Additional Medicare IM given:    Discharge Disposition:  HOME/SELF CARE  Per UR Regulation:  Reviewed for med. necessity/level of care/duration of stay  If discussed at Long Length of Stay Meetings, dates discussed:    Comments:  05/02/13 Rosalita Chessman [phone] PT FOR DISCHARGE HOME TODAY WITH DAUGHTER.  NO DC NEEDS IDENTIFIED.

## 2013-04-29 DIAGNOSIS — I2589 Other forms of chronic ischemic heart disease: Secondary | ICD-10-CM

## 2013-04-29 LAB — CBC
HCT: 38.7 % (ref 36.0–46.0)
Hemoglobin: 12.5 g/dL (ref 12.0–15.0)
MCV: 90.8 fL (ref 78.0–100.0)
Platelets: 199 10*3/uL (ref 150–400)
RBC: 4.26 MIL/uL (ref 3.87–5.11)
WBC: 8.4 10*3/uL (ref 4.0–10.5)

## 2013-04-29 LAB — GLUCOSE, CAPILLARY
Glucose-Capillary: 129 mg/dL — ABNORMAL HIGH (ref 70–99)
Glucose-Capillary: 178 mg/dL — ABNORMAL HIGH (ref 70–99)
Glucose-Capillary: 178 mg/dL — ABNORMAL HIGH (ref 70–99)

## 2013-04-29 LAB — BASIC METABOLIC PANEL
BUN: 44 mg/dL — ABNORMAL HIGH (ref 6–23)
CO2: 31 mEq/L (ref 19–32)
Chloride: 95 mEq/L — ABNORMAL LOW (ref 96–112)
Creatinine, Ser: 1.57 mg/dL — ABNORMAL HIGH (ref 0.50–1.10)

## 2013-04-29 LAB — PROCALCITONIN: Procalcitonin: <0.1 ng/mL

## 2013-04-29 NOTE — Progress Notes (Signed)
Patient ambulated 400 ft, started walk on 3 liters of oxygen, ended the walk on no supplemental oxygen. Patient saturating at 99% on room air. Patient returned to room and bed with call bell in reach. Will continue to monitor.

## 2013-04-29 NOTE — Progress Notes (Addendum)
[facility] PROGRESS NOTE  Miranda Cruz WUJ:[id number] DOB: [birth year] DOA: 04/27/2013 PCP: Hoyle Sauer, MD  Assessment/Plan: Pleural effusion - right  parapneumonic effusion from recent bout of pneumonia vs CHF  -continue levaquin -BNP is elevated, continue diuresis and obtain echo -s/p paracentesis with 1.5 L fluid removed- so far not c/w empyema SOB reolved  Active Problems:  Acute respiratory failure with hypoxia Due to  #1, Management as above. Continue oxygen and bronchodilator nebulizers. CKD (chronic kidney disease) stage 4, GFR 15-29 ml/min last cr 02/2013 was 2.43 -Continue close monitoring of renal function cr trending up but still <baseline DM (diabetes mellitus) -ok BG control on SSI Cardiomyopathy, ischemic  CAD (coronary artery disease)  -chest pain free, continue out pt meds    Code Status: FULL Family Communication: NONE at bedside Disposition Plan: pending clinical course   Consultants:  none  Procedures:  paracentesis  Antibiotics:  levaquin  HPI/Subjective: States decreased cough, no SOB  Objective: Filed Vitals:   04/29/13 0418  BP: 137/68  Pulse: 70  Temp: 97.9 F (36.6 C)  Resp: 18    Intake/Output Summary (Last 24 hours) at 04/29/13 0846 Last data filed at 04/29/13 0422  Gross per 24 hour  Intake    600 ml  Output    300 ml  Net    300 ml   Filed Weights   04/27/13 1641 04/28/13 0441 04/29/13 0418  Weight: 76.66 kg (169 lb 0.1 oz) 75.6 kg (166 lb 10.7 oz) 74.6 kg (164 lb 7.4 oz)   Exam:  General: alert & oriented x 3In NAD Cardiovascular: RRR, nl S1 s2 Respiratory: Scattered rhonchi, improved right sided aeration Abdomen: soft +BS NT/ND, no masses palpable Extremities: No cyanosis and no edema    Data Reviewed: Basic Metabolic Panel:  Recent Labs Lab 04/27/13 2018 04/29/13 0413  NA 137 136  K 3.9 3.7  CL 95* 95*  CO2 34* 31  GLUCOSE 126* 131*  BUN 33* 44*  CREATININE 1.36* 1.57*  CALCIUM 9.6 8.6    Liver Function Tests:  Recent Labs Lab 04/27/13 2018  AST 29  ALT 9  ALKPHOS 122*  BILITOT 0.5  PROT 6.6  ALBUMIN 2.9*   No results found for this basename: LIPASE, AMYLASE,  in the last 168 hours No results found for this basename: AMMONIA,  in the last 168 hours CBC:  Recent Labs Lab 04/27/13 2018 04/29/13 0413  WBC 10.6* 8.4  HGB 13.6 12.5  HCT 41.4 38.7  MCV 91.6 90.8  PLT 220 199   Cardiac Enzymes: No results found for this basename: CKTOTAL, CKMB, CKMBINDEX, TROPONINI,  in the last 168 hours BNP (last 3 results)  Recent Labs  04/27/13 2019  PROBNP 6703.0*   CBG:  Recent Labs Lab 04/28/13 0615 04/28/13 1118 04/28/13 1648 04/28/13 2057 04/29/13 0622  GLUCAP 123* 144* 154* 174* 129*    No results found for this or any previous visit (from the past 240 hour(s)).   Studies: Dg Chest 1 View  04/28/2013   CLINICAL DATA:  79 year old female status post thoracentesis on the right.  EXAM: CHEST - 1 VIEW  COMPARISON:  04/27/2013 and earlier.  FINDINGS: AP view at 1615 hrs. Decreased right pleural effusion. No pneumothorax. Moderately improved right lung ventilation. Stable cardiac size and mediastinal contours. Visualized tracheal air column is within normal limits. Mildly lower lung volumes overall with mild left lung base atelectasis. Left chest cardiac pacemaker again noted.  IMPRESSION: Decreased right pleural effusion and no pneumothorax  following thoracentesis. Mildly lower lung volumes.   Electronically Signed   By: Augusto Gamble   On: 04/28/2013 16:22   Dg Chest 2 View  04/27/2013   *RADIOLOGY REPORT*  Clinical Data: Dyspnea, pleural effusion  CHEST - 2 VIEW  Comparison: 02/07/2013  Findings: Left subclavian pacemaker stable.  Cardiomegaly stable. There is a new moderate right pleural effusion with significant atelectasis/consolidation in the right lower lobe.  There may be tiny left pleural effusion atheromatous aortic arch. Perihilar interstitial infiltrates  or edema.  IMPRESSION:  1.  New moderate right pleural effusion with right lower lung consolidation / atelectasis.   Original Report Authenticated By: D. Andria Rhein, MD   US Thoracentesis Asp Pleural Space W/img Guide  04/28/2013   *RADIOLOGY REPORT*  Clinical Data:  Chronic kidney disease, CAD/CHF, right pleural effusion, dyspnea. Request is made for diagnostic/therapeutic right thoracentesis.  ULTRASOUND GUIDED DIAGNOSTIC AND THERAPEUTIC RIGHT THORACENTESIS  An ultrasound guided thoracentesis was thoroughly discussed with the patient and questions answered.  The benefits, risks, alternatives and complications were also discussed.  The patient understands and wishes to proceed with the procedure.  Written consent was obtained.  Ultrasound was performed to localize and mark an adequate pocket of fluid in the right chest.  The area was then prepped and draped in the normal sterile fashion.  1% Lidocaine was used for local anesthesia.  Under ultrasound guidance a 19 gauge Yueh catheter was introduced.  Thoracentesis was performed.  The catheter was removed and a dressing applied.  Complications:  none  Findings: A total of approximately 1.5 liters of yellow fluid was removed. A fluid sample was sent for laboratory analysis.  IMPRESSION: Successful ultrasound guided diagnostic and therapeutic right thoracentesis yielding 1.5 liters of pleural fluid.  Read by: Hayes Ludwig.   Original Report Authenticated By: D. Andria Rhein, MD    Scheduled Meds: . allopurinol  100 mg Oral Daily  . ALPRAZolam  1 mg Oral QHS  . aspirin EC  81 mg Oral QHS  . atorvastatin  40 mg Oral QHS  . dextromethorphan-guaiFENesin  1 tablet Oral BID  . furosemide  80 mg Oral BID  . heparin  5,000 Units Subcutaneous Q8H  . insulin aspart  0-5 Units Subcutaneous QHS  . insulin aspart  0-9 Units Subcutaneous TID WC  . isosorbide mononitrate  30 mg Oral Daily  . levalbuterol  0.63 mg Nebulization TID  . levofloxacin  500 mg Oral  Q48H  . levothyroxine  125 mcg Oral QAC breakfast  . metoprolol tartrate  12.5 mg Oral BID  . pantoprazole  40 mg Oral Daily  . pneumococcal 23 valent vaccine  0.5 mL Intramuscular Tomorrow-1000  . sevelamer carbonate  800 mg Oral BID WC  . sodium chloride  3 mL Intravenous Q12H  . Vitamin D (Ergocalciferol)  50,000 Units Oral 3 times weekly   Continuous Infusions:   Principal Problem:   Pleural effusion - right Active Problems:   CKD (chronic kidney disease) stage 4, GFR 15-29 ml/min   DM (diabetes mellitus)   Cardiomyopathy, ischemic   CAD (coronary artery disease)   Acute respiratory failure with hypoxia    Time spent: >95mins    [facility] C  [facility] Pager [phone]. If 7PM-7AM, please contact night-coverage at [email or web address], password [facility] 04/29/2013, 8:46 AM  LOS: 2 days

## 2013-04-29 NOTE — Progress Notes (Signed)
Pt's daughter-in-law (employee Ed Blalock) referred [place] to pt. Pt was sitting up in bed; talking and laughing. She is a very pleasant woman; she explained why she is in the hospital (fluid on her lung) and may get to go home tomorrow. Pt asked for prayer. After prayer pt and I talked a few more minutes. Pt was thankful for visit and and prayer. Marjory Lies Chaplain  04/29/13 1040  Clinical Encounter Type  Visited With Patient

## 2013-04-29 NOTE — Progress Notes (Signed)
  Echocardiogram 2D Echocardiogram has been performed.  Miranda Cruz FRANCES 04/29/2013, 12:46 PM

## 2013-04-30 DIAGNOSIS — I5023 Acute on chronic systolic (congestive) heart failure: Secondary | ICD-10-CM | POA: Diagnosis present

## 2013-04-30 DIAGNOSIS — I509 Heart failure, unspecified: Secondary | ICD-10-CM

## 2013-04-30 LAB — BASIC METABOLIC PANEL
CO2: 32 mEq/L (ref 19–32)
Calcium: 8.9 mg/dL (ref 8.4–10.5)
Creatinine, Ser: 1.85 mg/dL — ABNORMAL HIGH (ref 0.50–1.10)
GFR calc Af Amer: 29 mL/min — ABNORMAL LOW (ref >90)
GFR calc non Af Amer: 25 mL/min — ABNORMAL LOW (ref >90)
Sodium: 134 mEq/L — ABNORMAL LOW (ref 135–145)

## 2013-04-30 LAB — GLUCOSE, CAPILLARY
Glucose-Capillary: 116 mg/dL — ABNORMAL HIGH (ref 70–99)
Glucose-Capillary: 204 mg/dL — ABNORMAL HIGH (ref 70–99)
Glucose-Capillary: 172 mg/dL — ABNORMAL HIGH (ref 70–99)
Glucose-Capillary: 187 mg/dL — ABNORMAL HIGH (ref 70–99)

## 2013-04-30 MED ORDER — CARVEDILOL 6.25 MG PO TABS
6.2500 mg | ORAL_TABLET | Freq: Two times a day (BID) | ORAL | Status: DC
  Administered 2013-04-30 – 2013-05-02 (×4): 6.25 mg via ORAL
  Filled 2013-04-30 (×6): qty 1

## 2013-04-30 MED ORDER — FUROSEMIDE 10 MG/ML IJ SOLN
40.0000 mg | Freq: Two times a day (BID) | INTRAMUSCULAR | Status: DC
  Administered 2013-04-30 – 2013-05-02 (×4): 40 mg via INTRAVENOUS
  Filled 2013-04-30 (×6): qty 4

## 2013-04-30 NOTE — Consult Note (Addendum)
Admit date: 04/27/2013 Referring Physician  Dr. Suanne Marker Primary Physician  Dr. Felipa Eth Primary Cardiologist  Dr. Anne Fu Reason for Consultation  SOB  HPI: Miranda Cruz is a 79 y.o. female with history of CAD, ischemic cardiomyopathy, EF of 45% on TTE 02/01/13, PPM, stage IV chronic kidney disease with baseline creatinine 2-2.5, gout, type II DM, recent discharge from hospital on 02/11/2013 at which time she was admitted for acute on chronic renal failure, bilateral pneumonia, cardiogenic shock & acute encephalopathy. She was sent directly from PCPs office today due to worsening cough and dyspnea. She was also found to be hypoxic in the PCPs office. Patient gives approximately one-week history of cough with intermittent white sputum, running nose but no chest pains, fever, chills. Initially this was associated with mild dyspnea. She was started on a course of oral Levaquin on 8/19 for presumed sinusitis. However over the course of the last couple of days, her cough and dyspnea have progressively gotten worse. She was found to have oxygen saturation of 82% on room air with activity that improved to 98% on 2 L of oxygen via nasal cannula. Chest x-ray was reported to have moderate sized right pleural effusion. She was sent to the hospital for further evaluation and management. Her pleural effusion was felt to be parapneumonic from recent bout of PNA.  She was started on empiric antibiotics.  She underwent right thoracentesis of 1.5L yellow fluid with resolution of SOB.   She ambulated today and at end of the walk her O2 sats were 99% on room air.  BNP was elevated on admission at 6700. 2D echo showed EF 30-35% with dyskineses of the entire anteroseptum and apical myocardium.   Cardiology is now consulted to evaluate current medical therapy.    PMH:   Past Medical History  Diagnosis Date  . Myocardial infarction 1995  . CHF (congestive heart failure)   . Pacemaker   . Hypertension   . High cholesterol   . Pleural  effusion on right 04/27/2013    [name] 04/27/2013 (04/27/2013)  . Chronic kidney disease (CKD), stage IV (severe)     [name] 04/27/2013 (04/27/2013)  . Pneumonia 1948; 1954; 02/2013; ?04/27/2013    "real bad the first 2 times"; don't remember how bad in 02/2013; might have it now"  (04/27/2013)  . Exertional shortness of breath     "for the past week" (04/27/2013)  . Hypothyroidism   . Type II diabetes mellitus   . History of blood transfusion 2010    "w/hip replacement" (04/27/2013)  . Arthritis     "back; hands" (04/27/2013)  . Chronic lower back pain   . Gout     "several times; left ankle; fingers" (04/27/2013)     PSH:   Past Surgical History  Procedure Laterality Date  . Coronary angioplasty  1995  . Coronary angioplasty with stent placement  2000    "?1" (04/27/2013)  . Abdominal wall mesh  removal  07/2007    "took the part out that was infected" (04/27/2013)  . Total hip arthroplasty Left 2009  . Total hip arthroplasty Right 2010  . Insert / replace / remove pacemaker  ~ 2011  . Hernia repair  06/2007    "umbilical hernia repair" (04/27/2013)  . Cataract extraction w/ intraocular lens implant Right 05/2012  . Joint replacement      Allergies:  Morphine and related; Phenergan; Prednisone; and Trazodone and nefazodone Prior to Admit Meds:   Prescriptions prior to admission  Medication Sig Dispense  Refill  . acetaminophen (TYLENOL) 500 MG tablet Take 1,000 mg by mouth daily as needed for pain.      Marland Kitchen allopurinol (ZYLOPRIM) 100 MG tablet Take 100 mg by mouth daily.      Marland Kitchen ALPRAZolam (XANAX) 1 MG tablet Take 1 mg by mouth at bedtime.      Marland Kitchen aspirin EC 81 MG tablet Take 81 mg by mouth at bedtime.      Marland Kitchen atorvastatin (LIPITOR) 40 MG tablet Take 40 mg by mouth at bedtime.       . fluticasone (FLONASE) 50 MCG/ACT nasal spray Place 2 sprays into the nose 2 (two) times daily as needed for rhinitis or allergies.      . furosemide (LASIX) 80 MG tablet Take 1 tablet (80 mg total) by mouth 2  (two) times daily.  60 tablet  3  . glyBURIDE (DIABETA) 2.5 MG tablet Take 1.25 mg by mouth daily as needed (if sugar is over 120).      Marland Kitchen guaiFENesin (MUCINEX) 600 MG 12 hr tablet Take 1 tablet (600 mg total) by mouth 2 (two) times daily.  60 tablet  0  . isosorbide mononitrate (IMDUR) 30 MG 24 hr tablet Take 30 mg by mouth daily.      Marland Kitchen levalbuterol (XOPENEX HFA) 45 MCG/ACT inhaler Inhale 1-2 puffs into the lungs 3 (three) times daily as needed for wheezing or shortness of breath.      . levofloxacin (LEVAQUIN) 750 MG tablet Take 750 mg by mouth daily. 7 day course started 04/25/2013      . levothyroxine (SYNTHROID, LEVOTHROID) 125 MCG tablet Take 125 mcg by mouth daily.      . Menthol, Topical Analgesic, (BENGAY EX) Apply 1 application topically daily as needed (hand pain).      . metoprolol tartrate (LOPRESSOR) 25 MG tablet Take 0.5 tablets (12.5 mg total) by mouth 2 (two) times daily.      . nitroGLYCERIN (NITROSTAT) 0.4 MG SL tablet Place 0.4 mg under the tongue as needed for chest pain.      Marland Kitchen omega-3 acid ethyl esters (LOVAZA) 1 G capsule Take 1 g by mouth daily as needed (constipation).       Marland Kitchen oxycodone (OXY-IR) 5 MG capsule Take 10 mg by mouth 3 (three) times daily as needed for pain.      . pantoprazole (PROTONIX) 40 MG tablet Take 40 mg by mouth daily.      . Pseudoeph-Doxylamine-DM-APAP (NYQUIL PO) Take by mouth at bedtime as needed (cough). Small capful      . Pseudoephedrine-APAP-DM (DAYQUIL PO) Take by mouth daily as needed (cough). Small capful      . sevelamer carbonate (RENVELA) 800 MG tablet Take 800 mg by mouth 2 (two) times daily. Breakfast and dinner      . Vitamin D, Ergocalciferol, (DRISDOL) 50000 UNITS CAPS Take 50,000 Units by mouth 3 (three) times a week. Approximately every other day      . [DISCONTINUED] levalbuterol (XOPENEX HFA) 45 MCG/ACT inhaler Inhale 1-2 puffs into the lungs every 4 (four) hours as needed for wheezing.  1 Inhaler  12   Fam HX:   History  reviewed. No pertinent family history. Social HX:    History   Social History  . Marital Status: Widowed    Spouse Name: N/A    Number of Children: N/A  . Years of Education: N/A   Occupational History  . Not on file.   Social History Main Topics  . Smoking  status: Never Smoker   . Smokeless tobacco: Never Used  . Alcohol Use: No  . Drug Use: No  . Sexual Activity: No   Other Topics Concern  . Not on file   Social History Narrative  . No narrative on file     ROS:  All 11 ROS were addressed and are negative except what is stated in the HPI  Physical Exam: Blood pressure 120/64, pulse 70, temperature 97.7 F (36.5 C), temperature source Oral, resp. rate 17, height 5\' 4"  (1.626 m), weight 73.437 kg (161 lb 14.4 oz), SpO2 91.00%.    General: Well developed, well nourished, in no acute distress Head: Eyes PERRLA, No xanthomas.   Normal cephalic and atramatic  Lungs:   decrased BS at right base with crackles at bases bilaterally Heart:   HRRR S1 S2 Pulses are 2+ & equal.            No carotid bruit. No JVD.  No abdominal bruits. No femoral bruits. Abdomen: Bowel sounds are positive, abdomen soft and non-tender without masses Extremities:   No clubbing, cyanosis or edema.  DP +1 Neuro: Alert and oriented X 3. Psych:  Good affect, responds appropriately    Labs:   Lab Results  Component Value Date   WBC 8.4 04/29/2013   HGB 12.5 04/29/2013   HCT 38.7 04/29/2013   MCV 90.8 04/29/2013   PLT 199 04/29/2013    Recent Labs Lab 04/27/13 2018  04/30/13 0500  NA 137  < > 134*  K 3.9  < > 3.5  CL 95*  < > 95*  CO2 34*  < > 32  BUN 33*  < > 54*  CREATININE 1.36*  < > 1.85*  CALCIUM 9.6  < > 8.9  PROT 6.6  --   --   BILITOT 0.5  --   --   ALKPHOS 122*  --   --   ALT 9  --   --   AST 29  --   --   GLUCOSE 126*  < > 159*  < > = values in this interval not displayed. No results found for this basename: PTT   Lab Results  Component Value Date   INR 1.16 04/27/2013    INR 1.0 01/21/2009   INR 1.0 07/12/2008   Lab Results  Component Value Date   CKTOTAL 82 01/31/2013   CKMB 7.0* 01/31/2013   TROPONINI <0.30 01/31/2013         Radiology:  Dg Chest 1 View  04/28/2013   CLINICAL DATA:  79 year old female status post thoracentesis on the right.  EXAM: CHEST - 1 VIEW  COMPARISON:  04/27/2013 and earlier.  FINDINGS: AP view at 1615 hrs. Decreased right pleural effusion. No pneumothorax. Moderately improved right lung ventilation. Stable cardiac size and mediastinal contours. Visualized tracheal air column is within normal limits. Mildly lower lung volumes overall with mild left lung base atelectasis. Left chest cardiac pacemaker again noted.  IMPRESSION: Decreased right pleural effusion and no pneumothorax following thoracentesis. Mildly lower lung volumes.   Electronically Signed   By: Augusto Gamble   On: 04/28/2013 16:22   US Thoracentesis Asp Pleural Space W/img Guide  04/28/2013   *RADIOLOGY REPORT*  Clinical Data:  Chronic kidney disease, CAD/CHF, right pleural effusion, dyspnea. Request is made for diagnostic/therapeutic right thoracentesis.  ULTRASOUND GUIDED DIAGNOSTIC AND THERAPEUTIC RIGHT THORACENTESIS  An ultrasound guided thoracentesis was thoroughly discussed with the patient and questions answered.  The benefits, risks, alternatives and  complications were also discussed.  The patient understands and wishes to proceed with the procedure.  Written consent was obtained.  Ultrasound was performed to localize and mark an adequate pocket of fluid in the right chest.  The area was then prepped and draped in the normal sterile fashion.  1% Lidocaine was used for local anesthesia.  Under ultrasound guidance a 19 gauge Yueh catheter was introduced.  Thoracentesis was performed.  The catheter was removed and a dressing applied.  Complications:  none  Findings: A total of approximately 1.5 liters of yellow fluid was removed. A fluid sample was sent for laboratory analysis.   IMPRESSION: Successful ultrasound guided diagnostic and therapeutic right thoracentesis yielding 1.5 liters of pleural fluid.  Read by: Hayes Ludwig.   Original Report Authenticated By: D. Andria Rhein, MD    EKG:  NSR with V paced rhythm  ASSESSMENT:  1.  Acute on chronic systolic CHF - EF appears slightly worse than in May with similar wall motion abnormalities.   2.  Ischemic dilated CM EF 30-35% 3.  CAD 4.  PPM 5.  Chronic kidney disease stage IV 6.  Type II DM  PLAN:   1.  Stop Lopressor and start Coreg 6.25mg  BID 2.  No ACE I/ARB secondary to renal failure 3.  Continue Imdur and consider addition of low dose hydralazine for afterload reduction if BP stable on beta blocker change 4.  Continue diuretics but would change to IV for 1-2 days given elevated BNP with persistent decreased BS and crackles at right base  Quintella Reichert, MD  04/30/2013  11:50 AM

## 2013-04-30 NOTE — Progress Notes (Signed)
[facility] PROGRESS NOTE  JACORA HOPKINS WUJ:[id number] DOB: 1930-07-06 DOA: 04/27/2013 PCP: Hoyle Sauer, MD  Assessment/Plan: Pleural effusion - right  parapneumonic effusion from recent bout of pneumonia vs CHF  -continue levaquin -BNP is elevated, continue diuresis and echo with EF 30-35% which is decreased compared to his may 2014 echo -cards consult for further recs -s/p paracentesis with 1.5 L fluid removed- so far not c/w empyema SOB resolved, oxygenating well on room air  Active Problems:  Acute respiratory failure with hypoxia Due to  #1, Management as above. Continue oxygen and bronchodilator nebulizers. CKD (chronic kidney disease) stage 4, GFR 15-29 ml/min last cr 02/2013 was 2.43 -Continue close monitoring of renal function cr trending up but still <baseline DM (diabetes mellitus) -ok BG control on SSI Cardiomyopathy, ischemic/Acute on chronic systolic CHF -continue outpt meds and diuresing with lasix -cards consulted as above CAD (coronary artery disease)  -chest pain free, continue out pt meds    Code Status: FULL Family Communication: NONE at bedside Disposition Plan: possibly to home in am   Consultants:  none  Procedures:  paracentesis  Antibiotics:  levaquin  HPI/Subjective: States decreased cough, no SOB, better today.  Objective: Filed Vitals:   04/30/13 0407  BP: 120/64  Pulse:   Temp: 97.7 F (36.5 C)  Resp: 17    Intake/Output Summary (Last 24 hours) at 04/30/13 0918 Last data filed at 04/30/13 0841  Gross per 24 hour  Intake    600 ml  Output    250 ml  Net    350 ml   Filed Weights   04/28/13 0441 04/29/13 0418 04/30/13 0407  Weight: 75.6 kg (166 lb 10.7 oz) 74.6 kg (164 lb 7.4 oz) 73.437 kg (161 lb 14.4 oz)   Exam:  General: alert & oriented x 3In NAD Cardiovascular: RRR, nl S1 s2 Respiratory: Scattered rhonchi, improved right sided aeration Abdomen: soft +BS NT/ND, no masses palpable Extremities: No cyanosis  and no edema    Data Reviewed: Basic Metabolic Panel:  Recent Labs Lab 04/27/13 2018 04/29/13 0413 04/30/13 0500  NA 137 136 134*  K 3.9 3.7 3.5  CL 95* 95* 95*  CO2 34* 31 32  GLUCOSE 126* 131* 159*  BUN 33* 44* 54*  CREATININE 1.36* 1.57* 1.85*  CALCIUM 9.6 8.6 8.9   Liver Function Tests:  Recent Labs Lab 04/27/13 2018  AST 29  ALT 9  ALKPHOS 122*  BILITOT 0.5  PROT 6.6  ALBUMIN 2.9*   No results found for this basename: LIPASE, AMYLASE,  in the last 168 hours No results found for this basename: AMMONIA,  in the last 168 hours CBC:  Recent Labs Lab 04/27/13 2018 04/29/13 0413  WBC 10.6* 8.4  HGB 13.6 12.5  HCT 41.4 38.7  MCV 91.6 90.8  PLT 220 199   Cardiac Enzymes: No results found for this basename: CKTOTAL, CKMB, CKMBINDEX, TROPONINI,  in the last 168 hours BNP (last 3 results)  Recent Labs  04/27/13 2019  PROBNP 6703.0*   CBG:  Recent Labs Lab 04/29/13 1120 04/29/13 1320 04/29/13 1626 04/29/13 2110 04/30/13 0617  GLUCAP 167* 178* 174* 178* 116*    Recent Results (from the past 240 hour(s))  BODY FLUID CULTURE     Status: None   Collection Time    04/28/13  3:16 PM      Result Value Range Status   Specimen Description PLEURAL FLUID RIGHT   Final   Special Requests FLUID   Final  Gram Stain     Final   Value: RARE WBC PRESENT,BOTH PMN AND MONONUCLEAR     NO ORGANISMS SEEN     Performed at [facility]   Culture     Final   Value: NO GROWTH     Performed at [facility]   Report Status PENDING   Incomplete     Studies: Dg Chest 1 View  04/28/2013   CLINICAL DATA:  79 year old female status post thoracentesis on the right.  EXAM: CHEST - 1 VIEW  COMPARISON:  04/27/2013 and earlier.  FINDINGS: AP view at 1615 hrs. Decreased right pleural effusion. No pneumothorax. Moderately improved right lung ventilation. Stable cardiac size and mediastinal contours. Visualized tracheal air column is within normal limits.  Mildly lower lung volumes overall with mild left lung base atelectasis. Left chest cardiac pacemaker again noted.  IMPRESSION: Decreased right pleural effusion and no pneumothorax following thoracentesis. Mildly lower lung volumes.   Electronically Signed   By: Augusto Gamble   On: 04/28/2013 16:22   US Thoracentesis Asp Pleural Space W/img Guide  04/28/2013   *RADIOLOGY REPORT*  Clinical Data:  Chronic kidney disease, CAD/CHF, right pleural effusion, dyspnea. Request is made for diagnostic/therapeutic right thoracentesis.  ULTRASOUND GUIDED DIAGNOSTIC AND THERAPEUTIC RIGHT THORACENTESIS  An ultrasound guided thoracentesis was thoroughly discussed with the patient and questions answered.  The benefits, risks, alternatives and complications were also discussed.  The patient understands and wishes to proceed with the procedure.  Written consent was obtained.  Ultrasound was performed to localize and mark an adequate pocket of fluid in the right chest.  The area was then prepped and draped in the normal sterile fashion.  1% Lidocaine was used for local anesthesia.  Under ultrasound guidance a 19 gauge Yueh catheter was introduced.  Thoracentesis was performed.  The catheter was removed and a dressing applied.  Complications:  none  Findings: A total of approximately 1.5 liters of yellow fluid was removed. A fluid sample was sent for laboratory analysis.  IMPRESSION: Successful ultrasound guided diagnostic and therapeutic right thoracentesis yielding 1.5 liters of pleural fluid.  Read by: Hayes Ludwig.   Original Report Authenticated By: D. Andria Rhein, MD    Scheduled Meds: . allopurinol  100 mg Oral Daily  . ALPRAZolam  1 mg Oral QHS  . aspirin EC  81 mg Oral QHS  . atorvastatin  40 mg Oral QHS  . dextromethorphan-guaiFENesin  1 tablet Oral BID  . furosemide  80 mg Oral BID  . heparin  5,000 Units Subcutaneous Q8H  . insulin aspart  0-5 Units Subcutaneous QHS  . insulin aspart  0-9 Units Subcutaneous  TID WC  . isosorbide mononitrate  30 mg Oral Daily  . levofloxacin  500 mg Oral Q48H  . levothyroxine  125 mcg Oral QAC breakfast  . metoprolol tartrate  12.5 mg Oral BID  . pantoprazole  40 mg Oral Daily  . sevelamer carbonate  800 mg Oral BID WC  . sodium chloride  3 mL Intravenous Q12H  . Vitamin D (Ergocalciferol)  50,000 Units Oral 3 times weekly   Continuous Infusions:   Principal Problem:   Pleural effusion - right Active Problems:   CKD (chronic kidney disease) stage 4, GFR 15-29 ml/min   DM (diabetes mellitus)   Cardiomyopathy, ischemic   CAD (coronary artery disease)   Acute respiratory failure with hypoxia    Time spent: >49mins    [facility]  [facility] Pager [phone]. If 7PM-7AM,  please contact night-coverage at [email or web address], password [facility] 04/30/2013, 9:18 AM  LOS: 3 days

## 2013-05-01 DIAGNOSIS — E871 Hypo-osmolality and hyponatremia: Secondary | ICD-10-CM | POA: Diagnosis present

## 2013-05-01 DIAGNOSIS — Z950 Presence of cardiac pacemaker: Secondary | ICD-10-CM | POA: Diagnosis present

## 2013-05-01 LAB — BASIC METABOLIC PANEL
Calcium: 8.9 mg/dL (ref 8.4–10.5)
GFR calc Af Amer: 34 mL/min — ABNORMAL LOW (ref >90)
GFR calc non Af Amer: 29 mL/min — ABNORMAL LOW (ref >90)
Glucose, Bld: 176 mg/dL — ABNORMAL HIGH (ref 70–99)
Potassium: 3.6 mEq/L (ref 3.5–5.1)
Sodium: 129 mEq/L — ABNORMAL LOW (ref 135–145)

## 2013-05-01 LAB — GLUCOSE, CAPILLARY
Glucose-Capillary: 125 mg/dL — ABNORMAL HIGH (ref 70–99)
Glucose-Capillary: 167 mg/dL — ABNORMAL HIGH (ref 70–99)

## 2013-05-01 NOTE — Progress Notes (Signed)
[facility] PROGRESS NOTE  Miranda Cruz ZOX:[id number] DOB: November 01, 1929 DOA: 04/27/2013 PCP: Hoyle Sauer, MD  Assessment/Plan: Pleural effusion - right  parapneumonic effusion from recent bout of pneumonia vs CHF  -s/p paracentesis 8/22 with 1.5 L fluid removed- so far not c/w empyema -continue levaquin -BNP was elevated, continue diuresis and echo with EF 30-35% which is decreased compared to his may 2014 echo -cards consult was consulted on 8/23 for further recs -SOB resolved, oxygenating well on room air  Active Problems:  Acute respiratory failure with hypoxia Due to  #1, Management as above. Continue oxygen and bronchodilator nebulizers. CKD (chronic kidney disease) stage 4, GFR 15-29 ml/min last cr 02/2013 was 2.43 -Continue close monitoring of renal function cr trending up but still <baseline DM (diabetes mellitus) -ok BG control on SSI Cardiomyopathy, ischemic/Acute on chronic systolic CHF -changed to IV lasix per cards, follow -continue outpt meds, lopressor was changed to coreg, cards to follow and consider adding hydralazine to imdur -appreciate cards  assistance CAD (coronary artery disease)  -chest pain free, continue out pt meds Hyponatremia -fluid restriction as per cards today, follow and recheck   Code Status: FULL Family Communication: NONE at bedside Disposition Plan: possibly to home in am   Consultants:  none  Procedures:  Paracentesis-8/22  Antibiotics:  levaquin  HPI/Subjective: States feeling tired today, but decreased cough, no SOB  Objective: Filed Vitals:   05/01/13 0359  BP: 137/83  Pulse: 70  Temp: 97.8 F (36.6 C)  Resp: 19    Intake/Output Summary (Last 24 hours) at 05/01/13 1112 Last data filed at 05/01/13 0825  Gross per 24 hour  Intake    480 ml  Output    450 ml  Net     30 ml   Filed Weights   04/29/13 0418 04/30/13 0407 05/01/13 0359  Weight: 74.6 kg (164 lb 7.4 oz) 73.437 kg (161 lb 14.4 oz) 75.932 kg  (167 lb 6.4 oz)   Exam:  General: alert & oriented x 3In NAD Cardiovascular: RRR, nl S1 s2 Respiratory: few R. base crackles, no wheezes Abdomen: soft +BS NT/ND, no masses palpable Extremities: No cyanosis and no edema    Data Reviewed: Basic Metabolic Panel:  Recent Labs Lab 04/27/13 2018 04/29/13 0413 04/30/13 0500 05/01/13 0410  NA 137 136 134* 129*  K 3.9 3.7 3.5 3.6  CL 95* 95* 95* 91*  CO2 34* 31 32 26  GLUCOSE 126* 131* 159* 176*  BUN 33* 44* 54* 57*  CREATININE 1.36* 1.57* 1.85* 1.60*  CALCIUM 9.6 8.6 8.9 8.9   Liver Function Tests:  Recent Labs Lab 04/27/13 2018  AST 29  ALT 9  ALKPHOS 122*  BILITOT 0.5  PROT 6.6  ALBUMIN 2.9*   No results found for this basename: LIPASE, AMYLASE,  in the last 168 hours No results found for this basename: AMMONIA,  in the last 168 hours CBC:  Recent Labs Lab 04/27/13 2018 04/29/13 0413  WBC 10.6* 8.4  HGB 13.6 12.5  HCT 41.4 38.7  MCV 91.6 90.8  PLT 220 199   Cardiac Enzymes: No results found for this basename: CKTOTAL, CKMB, CKMBINDEX, TROPONINI,  in the last 168 hours BNP (last 3 results)  Recent Labs  04/27/13 2019  PROBNP 6703.0*   CBG:  Recent Labs Lab 04/30/13 1112 04/30/13 1607 04/30/13 2045 04/30/13 2104 05/01/13 0603  GLUCAP 204* 182* 187* 172* 125*    Recent Results (from the past 240 hour(s))  BODY FLUID CULTURE  Status: None   Collection Time    04/28/13  3:16 PM      Result Value Range Status   Specimen Description PLEURAL FLUID RIGHT   Final   Special Requests FLUID   Final   Gram Stain     Final   Value: RARE WBC PRESENT,BOTH PMN AND MONONUCLEAR     NO ORGANISMS SEEN     Performed at [facility]   Culture     Final   Value: NO GROWTH 2 DAYS     Performed at [facility]   Report Status PENDING   Incomplete     Studies: No results found.  Scheduled Meds: . allopurinol  100 mg Oral Daily  . ALPRAZolam  1 mg Oral QHS  . aspirin EC  81 mg  Oral QHS  . atorvastatin  40 mg Oral QHS  . carvedilol  6.25 mg Oral BID WC  . dextromethorphan-guaiFENesin  1 tablet Oral BID  . furosemide  40 mg Intravenous BID  . heparin  5,000 Units Subcutaneous Q8H  . insulin aspart  0-5 Units Subcutaneous QHS  . insulin aspart  0-9 Units Subcutaneous TID WC  . isosorbide mononitrate  30 mg Oral Daily  . levofloxacin  500 mg Oral Q48H  . levothyroxine  125 mcg Oral QAC breakfast  . pantoprazole  40 mg Oral Daily  . sevelamer carbonate  800 mg Oral BID WC  . sodium chloride  3 mL Intravenous Q12H  . Vitamin D (Ergocalciferol)  50,000 Units Oral 3 times weekly   Continuous Infusions:   Principal Problem:   Pleural effusion - right Active Problems:   CKD (chronic kidney disease) stage 4, GFR 15-29 ml/min   DM (diabetes mellitus)   Cardiomyopathy, ischemic   CAD (coronary artery disease)   Acute respiratory failure with hypoxia   Acute on chronic systolic CHF (congestive heart failure)   Cardiac pacemaker in situ   Hyposmolality and/or hyponatremia    Time spent:    Kela Millin  [facility] Pager [phone]. If 7PM-7AM, please contact night-coverage at [email or web address], password [email or web address] 05/01/2013, 11:12 AM  LOS: 4 days

## 2013-05-01 NOTE — Progress Notes (Signed)
Subjective:  Still with mild cough. Feels 100% percent better after 1.5L thoracentesis.   Objective:  Vital Signs in the last 24 hours: Temp:  [97.8 F (36.6 C)-98.7 F (37.1 C)] 97.8 F (36.6 C) (08/25 0359) Pulse Rate:  [70-73] 70 (08/25 0359) Resp:  [18-19] 19 (08/25 0359) BP: (114-137)/(67-83) 137/83 mmHg (08/25 0359) SpO2:  [92 %-93 %] 93 % (08/25 0359) Weight:  [75.932 kg (167 lb 6.4 oz)] 75.932 kg (167 lb 6.4 oz) (08/25 0359)  Intake/Output from previous day: 08/24 0701 - 08/25 0700 In: 600 [P.O.:600] Out: 250 [Urine:250]   Physical Exam: General: Well developed, well nourished, in no acute distress. Head:  Normocephalic and atraumatic. Lungs: Mild crackles, mildly decreased R base.  Heart: Normal S1 and S2.  No murmur, rubs or gallops.  Abdomen: soft, non-tender, positive bowel sounds. Extremities: No clubbing or cyanosis. No edema. Neurologic: Alert and oriented x 3.    Lab Results:  Recent Labs  04/29/13 0413  WBC 8.4  HGB 12.5  PLT 199    Recent Labs  04/30/13 0500 05/01/13 0410  NA 134* 129*  K 3.5 3.6  CL 95* 91*  CO2 32 26  GLUCOSE 159* 176*  BUN 54* 57*  CREATININE 1.85* 1.60*     Telemetry: V paced Personally viewed.    Cardiac Studies:  EF 30%  Assessment/Plan:  Principal Problem:   Pleural effusion - right Active Problems:   CKD (chronic kidney disease) stage 4, GFR 15-29 ml/min   DM (diabetes mellitus)   Cardiomyopathy, ischemic   CAD (coronary artery disease)   Acute respiratory failure with hypoxia   Acute on chronic systolic CHF (congestive heart failure)   1. Acute on chronic systolic CHF - EF appears slightly worse than in May with similar wall motion abnormalities.  2. Ischemic dilated CM EF 30-35%  3. CAD  4. PPM  5. Chronic kidney disease stage IV  6. Type II DM 7. Hyponatremia   PLAN:  1. Stop Lopressor and start Coreg 6.25mg  BID - agree 2. No ACE I/ARB secondary to renal failure - agree 3. Continue Imdur  and consider addition of low dose hydralazine for afterload reduction if BP stable on beta blocker change  4. Continue diuretics IV for 1 more day given elevated BNP with persistent decreased BS and crackles at right base. Creat 1.6 stable.  5. Would fluid restrict 1.5L/day with hyponatremia.    Aiden Helzer 05/01/2013, 10:15 AM

## 2013-05-02 LAB — GLUCOSE, CAPILLARY
Glucose-Capillary: 143 mg/dL — ABNORMAL HIGH (ref 70–99)
Glucose-Capillary: 195 mg/dL — ABNORMAL HIGH (ref 70–99)
Glucose-Capillary: 169 mg/dL — ABNORMAL HIGH (ref 70–99)

## 2013-05-02 LAB — BASIC METABOLIC PANEL
CO2: 31 mEq/L (ref 19–32)
Calcium: 8.6 mg/dL (ref 8.4–10.5)
Chloride: 93 mEq/L — ABNORMAL LOW (ref 96–112)
Creatinine, Ser: 1.8 mg/dL — ABNORMAL HIGH (ref 0.50–1.10)
Glucose, Bld: 151 mg/dL — ABNORMAL HIGH (ref 70–99)

## 2013-05-02 LAB — BODY FLUID CULTURE

## 2013-05-02 MED ORDER — OXYCODONE HCL 5 MG PO CAPS: 10.0000 mg | ORAL_CAPSULE | Freq: Three times a day (TID) | ORAL | Status: DC | PRN

## 2013-05-02 MED ORDER — CARVEDILOL 6.25 MG PO TABS: 6.2500 mg | ORAL_TABLET | Freq: Two times a day (BID) | ORAL | Status: DC

## 2013-05-02 MED ORDER — ASPIRIN 81 MG PO CHEW
CHEWABLE_TABLET | ORAL | Status: AC
  Filled 2013-05-02: qty 1

## 2013-05-02 MED ORDER — FUROSEMIDE 80 MG PO TABS: 120.0000 mg | ORAL_TABLET | Freq: Two times a day (BID) | ORAL | Status: DC

## 2013-05-02 NOTE — Discharge Summary (Signed)
Physician Discharge Summary  Miranda Cruz ZOX:[id number] DOB: Nov 08, 1929 DOA: 04/27/2013  PCP: Hoyle Sauer, MD  Admit date: 04/27/2013 Discharge date: 05/02/2013  Time spent: >30 minutes  Recommendations for Outpatient Follow-up:  Follow-up Information   Follow up with Donato Schultz, MD On 05/10/2013. (10:15am, check BMET)    Specialty:  Cardiology   Contact information:   [address] [place] [address] [phone]       Follow up with Hoyle Sauer, MD. (in 1-2weeks, call for appt upon discharge)    Specialty:  Internal Medicine   Contact information:   [address] [facility] [place] [address] [phone]        Discharge Diagnoses:  Principal Problem:   Pleural effusion - right Active Problems:   CKD (chronic kidney disease) stage 4, GFR 15-29 ml/min   DM (diabetes mellitus)   Cardiomyopathy, ischemic   CAD (coronary artery disease)   Acute respiratory failure with hypoxia   Acute on chronic systolic CHF (congestive heart failure)   Cardiac pacemaker in situ   Hyposmolality and/or hyponatremia   Discharge Condition: improved/stable  Diet recommendation: heart healthy  Filed Weights   04/30/13 0407 05/01/13 0359 05/02/13 0419  Weight: 73.437 kg (161 lb 14.4 oz) 75.932 kg (167 lb 6.4 oz) 77 kg (169 lb 12.1 oz)    History of present illness:  Miranda Cruz is a 79 y.o. female with history of CAD, ischemic cardiomyopathy, EF of 45% on TTE 02/01/13, PPM, stage IV chronic kidney disease with baseline creatinine 2-2.5, gout, type II DM, recent discharge from hospital on 02/11/2013 at which time she was admitted for acute on chronic renal failure, bilateral pneumonia, cardiogenic shock & acute encephalopathy. She was sent directly from PCPs office today due to worsening cough and dyspnea. She was also found to be hypoxic in the PCPs office. Patient gives approximately one-week history of cough with intermittent white sputum,  running nose but no chest pains, fever, chills. Initially this was associated with mild dyspnea. She was started on a course of oral Levaquin on 8/19 for presumed sinusitis. However over the course of the last couple of days, her cough and dyspnea have progressively gotten worse. She had a followup appointment with her PCP tomorrow but went back to the office today do to worsening symptoms. She was found to have oxygen saturation of 82% on room air with activity that improved to 98% on 2 L of oxygen via nasal cannula. Chest x-ray was reported to have moderate sized right pleural effusion. She was sent to the hospital for further evaluation and management. She also gives history of poor appetite and has lost some weight over the last couple of weeks.   Hospital Course:   Pleural effusion - right  parapneumonic effusion from recent bout of pneumonia vs CHF  -s/p paracentesis 8/22 with 1.5 L fluid removed- so far not c/w empyema  -she complete levaquin course in the hospital. -BNP was elevated, she was diuresed with lasix and echo with EF 30-35% which is decreased compared to his may 2014 echo  -cards consult was consulted on 8/23 for further recs>> she was further diuresed and her meds adjusted as appropriate  -SOB resolved, oxygenating well on room air  Active Problems:  Acute respiratory failure with hypoxia  -Due to #1, Management as above. She was placed on supplemental  oxygen and bronchodilator nebulizers.  -She improved clinically and was oxygenating well on RA and did not meet criteria for home  O2. CKD (chronic kidney disease) stage 4, GFR 15-29 ml/min  last cr 02/2013 was 2.43  -Her renal function was closely monitored,  cr trending up but still <baseline 1.80 on d/c -follow up with PCP DM (diabetes mellitus)  -ok BG control on SSI, follow up with PCP.  Cardiomyopathy, ischemic/Acute on chronic systolic CHF  -As discussed above she was diuresed with lasix per cards -cards followed and  lopressor was changed to coreg, and she was maintained on imdur >> to follow up out pt with cards for further management/considering adding hydralazine CAD (coronary artery disease)  -chest pain free, continue out pt meds  Hyponatremia  -improved with fluid restriction per cards today, and she is to follow up outpt   Procedures: Paracentesis-8/22 echo   Consultations:  cards  Discharge Exam: Filed Vitals:   05/02/13 0419  BP: 126/64  Pulse: 71  Temp: 98.5 F (36.9 C)  Resp: 18    Exam:  General: alert & oriented x 3In NAD  Cardiovascular: RRR, nl S1 s2  Respiratory: few R. base crackles, no wheezes  Abdomen: soft +BS NT/ND, no masses palpable  Extremities: No cyanosis and no edema  Discharge Instructions  Discharge Orders   Future Orders Complete By Expires   Diet - low sodium heart healthy  As directed    Increase activity slowly  As directed        Medication List    STOP taking these medications       levofloxacin 750 MG tablet  Commonly known as:  LEVAQUIN     metoprolol tartrate 25 MG tablet  Commonly known as:  LOPRESSOR      TAKE these medications       acetaminophen 500 MG tablet  Commonly known as:  TYLENOL  Take 1,000 mg by mouth daily as needed for pain.     allopurinol 100 MG tablet  Commonly known as:  ZYLOPRIM  Take 100 mg by mouth daily.     ALPRAZolam 1 MG tablet  Commonly known as:  XANAX  Take 1 mg by mouth at bedtime.     aspirin EC 81 MG tablet  Take 81 mg by mouth at bedtime.     atorvastatin 40 MG tablet  Commonly known as:  LIPITOR  Take 40 mg by mouth at bedtime.     BENGAY EX  Apply 1 application topically daily as needed (hand pain).     carvedilol 6.25 MG tablet  Commonly known as:  COREG  Take 1 tablet (6.25 mg total) by mouth 2 (two) times daily with a meal.     DAYQUIL PO  Take by mouth daily as needed (cough). Small capful     fluticasone 50 MCG/ACT nasal spray  Commonly known as:  FLONASE  Place 2  sprays into the nose 2 (two) times daily as needed for rhinitis or allergies.     furosemide 80 MG tablet  Commonly known as:  LASIX  Take 1.5 tablets (120 mg total) by mouth 2 (two) times daily.     glyBURIDE 2.5 MG tablet  Commonly known as:  DIABETA  Take 1.25 mg by mouth daily as needed (if sugar is over 120).     guaiFENesin 600 MG 12 hr tablet  Commonly known as:  MUCINEX  Take 1 tablet (600 mg total) by mouth 2 (two) times daily.     isosorbide mononitrate 30 MG 24 hr tablet  Commonly known as:  IMDUR  Take 30 mg by mouth daily.  levalbuterol 45 MCG/ACT inhaler  Commonly known as:  XOPENEX HFA  Inhale 1-2 puffs into the lungs 3 (three) times daily as needed for wheezing or shortness of breath.     levothyroxine 125 MCG tablet  Commonly known as:  SYNTHROID, LEVOTHROID  Take 125 mcg by mouth daily.     nitroGLYCERIN 0.4 MG SL tablet  Commonly known as:  NITROSTAT  Place 0.4 mg under the tongue as needed for chest pain.     NYQUIL PO  Take by mouth at bedtime as needed (cough). Small capful     omega-3 acid ethyl esters 1 G capsule  Commonly known as:  LOVAZA  Take 1 g by mouth daily as needed (constipation).     oxycodone 5 MG capsule  Commonly known as:  OXY-IR  Take 2 capsules (10 mg total) by mouth 3 (three) times daily as needed for pain.     pantoprazole 40 MG tablet  Commonly known as:  PROTONIX  Take 40 mg by mouth daily.     sevelamer carbonate 800 MG tablet  Commonly known as:  RENVELA  Take 800 mg by mouth 2 (two) times daily. Breakfast and dinner     Vitamin D (Ergocalciferol) 50000 UNITS Caps capsule  Commonly known as:  DRISDOL  Take 50,000 Units by mouth 3 (three) times a week. Approximately every other day       Allergies  Allergen Reactions  . Morphine And Related Other (See Comments)    Made her loopy  . Phenergan [Promethazine Hcl] Other (See Comments)    Made her crazy  . Prednisone Other (See Comments)    Confusion, bad  dreams, insomnia  . Trazodone And Nefazodone Other (See Comments)    hallucinations        Follow-up Information   Follow up with Donato Schultz, MD On 05/10/2013. (10:15am, check BMET)    Specialty:  Cardiology   Contact information:   [address] [place] [address] [phone]       Follow up with Hoyle Sauer, MD. (in 1-2weeks, call for appt upon discharge)    Specialty:  Internal Medicine   Contact information:   [address] [facility] [place] [address] [phone]        The results of significant diagnostics from this hospitalization (including imaging, microbiology, ancillary and laboratory) are listed below for reference.    Significant Diagnostic Studies: Dg Chest 1 View  04/28/2013   CLINICAL DATA:  79 year old female status post thoracentesis on the right.  EXAM: CHEST - 1 VIEW  COMPARISON:  04/27/2013 and earlier.  FINDINGS: AP view at 1615 hrs. Decreased right pleural effusion. No pneumothorax. Moderately improved right lung ventilation. Stable cardiac size and mediastinal contours. Visualized tracheal air column is within normal limits. Mildly lower lung volumes overall with mild left lung base atelectasis. Left chest cardiac pacemaker again noted.  IMPRESSION: Decreased right pleural effusion and no pneumothorax following thoracentesis. Mildly lower lung volumes.   Electronically Signed   By: Augusto Gamble   On: 04/28/2013 16:22   Dg Chest 2 View  04/27/2013   *RADIOLOGY REPORT*  Clinical Data: Dyspnea, pleural effusion  CHEST - 2 VIEW  Comparison: 02/07/2013  Findings: Left subclavian pacemaker stable.  Cardiomegaly stable. There is a new moderate right pleural effusion with significant atelectasis/consolidation in the right lower lobe.  There may be tiny left pleural effusion atheromatous aortic arch. Perihilar interstitial infiltrates or edema.  IMPRESSION:  1.  New moderate  right pleural effusion with right lower lung  consolidation / atelectasis.   Original Report Authenticated By: D. Andria Rhein, MD   US Thoracentesis Asp Pleural Space W/img Guide  04/28/2013   *RADIOLOGY REPORT*  Clinical Data:  Chronic kidney disease, CAD/CHF, right pleural effusion, dyspnea. Request is made for diagnostic/therapeutic right thoracentesis.  ULTRASOUND GUIDED DIAGNOSTIC AND THERAPEUTIC RIGHT THORACENTESIS  An ultrasound guided thoracentesis was thoroughly discussed with the patient and questions answered.  The benefits, risks, alternatives and complications were also discussed.  The patient understands and wishes to proceed with the procedure.  Written consent was obtained.  Ultrasound was performed to localize and mark an adequate pocket of fluid in the right chest.  The area was then prepped and draped in the normal sterile fashion.  1% Lidocaine was used for local anesthesia.  Under ultrasound guidance a 19 gauge Yueh catheter was introduced.  Thoracentesis was performed.  The catheter was removed and a dressing applied.  Complications:  none  Findings: A total of approximately 1.5 liters of yellow fluid was removed. A fluid sample was sent for laboratory analysis.  IMPRESSION: Successful ultrasound guided diagnostic and therapeutic right thoracentesis yielding 1.5 liters of pleural fluid.  Read by: Hayes Ludwig.   Original Report Authenticated By: D. Andria Rhein, MD    Microbiology: Recent Results (from the past 240 hour(s))  BODY FLUID CULTURE     Status: None   Collection Time    04/28/13  3:16 PM      Result Value Range Status   Specimen Description PLEURAL FLUID RIGHT   Final   Special Requests FLUID   Final   Gram Stain     Final   Value: RARE WBC PRESENT,BOTH PMN AND MONONUCLEAR     NO ORGANISMS SEEN     Performed at [facility]   Culture     Final   Value: NO GROWTH 3 DAYS     Performed at [facility]   Report Status 05/02/2013 FINAL   Final     Labs: Basic Metabolic  Panel:  Recent Labs Lab 04/27/13 2018 04/29/13 0413 04/30/13 0500 05/01/13 0410 05/02/13 0355  NA 137 136 134* 129* 132*  K 3.9 3.7 3.5 3.6 4.0  CL 95* 95* 95* 91* 93*  CO2 34* 31 32 26 31  GLUCOSE 126* 131* 159* 176* 151*  BUN 33* 44* 54* 57* 62*  CREATININE 1.36* 1.57* 1.85* 1.60* 1.80*  CALCIUM 9.6 8.6 8.9 8.9 8.6   Liver Function Tests:  Recent Labs Lab 04/27/13 2018  AST 29  ALT 9  ALKPHOS 122*  BILITOT 0.5  PROT 6.6  ALBUMIN 2.9*   No results found for this basename: LIPASE, AMYLASE,  in the last 168 hours No results found for this basename: AMMONIA,  in the last 168 hours CBC:  Recent Labs Lab 04/27/13 2018 04/29/13 0413  WBC 10.6* 8.4  HGB 13.6 12.5  HCT 41.4 38.7  MCV 91.6 90.8  PLT 220 199   Cardiac Enzymes: No results found for this basename: CKTOTAL, CKMB, CKMBINDEX, TROPONINI,  in the last 168 hours BNP: BNP (last 3 results)  Recent Labs  04/27/13 2019  PROBNP 6703.0*   CBG:  Recent Labs Lab 05/01/13 1116 05/01/13 1606 05/01/13 2113 05/02/13 0559 05/02/13 1110  GLUCAP 193* 208* 167* 143* 195*       Signed:  Jasmina Gendron C  [facility] 05/02/2013, 11:56 AM

## 2013-05-02 NOTE — Progress Notes (Signed)
Subjective:  Sitting in chair. Happy. Feels much better.   Objective:  Vital Signs in the last 24 hours: Temp:  [97.9 F (36.6 C)-99 F (37.2 C)] 98.5 F (36.9 C) (08/26 0419) Pulse Rate:  [70-71] 71 (08/26 0419) Resp:  [18] 18 (08/26 0419) BP: (97-126)/(51-64) 126/64 mmHg (08/26 0419) SpO2:  [93 %-94 %] 93 % (08/26 0419) Weight:  [77 kg (169 lb 12.1 oz)] 77 kg (169 lb 12.1 oz) (08/26 0419)  Intake/Output from previous day: 08/25 0701 - 08/26 0700 In: 720 [P.O.:720] Out: 650 [Urine:650]   Physical Exam: General: Well developed, well nourished, in no acute distress. Head:  Normocephalic and atraumatic. Lungs: Decreased R base, mild crackles Heart: Normal S1 and S2.  No murmur, rubs or gallops.  Abdomen: soft, non-tender, positive bowel sounds. Extremities: No clubbing or cyanosis. No edema. Neurologic: Alert and oriented x 3.    Lab Results: No results found for this basename: WBC, HGB, PLT,  in the last 72 hours  Recent Labs  05/01/13 0410 05/02/13 0355  NA 129* 132*  K 3.6 4.0  CL 91* 93*  CO2 26 31  GLUCOSE 176* 151*  BUN 57* 62*  CREATININE 1.60* 1.80*    Telemetry: V paced Personally viewed.    Cardiac Studies:  EF 30%  Assessment/Plan:  Principal Problem:   Pleural effusion - right Active Problems:   CKD (chronic kidney disease) stage 4, GFR 15-29 ml/min   DM (diabetes mellitus)   Cardiomyopathy, ischemic   CAD (coronary artery disease)   Acute respiratory failure with hypoxia   Acute on chronic systolic CHF (congestive heart failure)   Cardiac pacemaker in situ   Hyposmolality and/or hyponatremia    - Change to PO lasix 120mg  BID (was on 80mg BID)  - Creat stable.  - Coreg 6.25 BID  - Imdur 30  - Statin  - OK with DC home. Will have close follow with BMET.    - Overall worrisome prognosis with age, cardiomyopathy, CKD.   SKAINS, MARK 05/02/2013, 9:01 AM

## 2013-05-02 NOTE — Progress Notes (Signed)
Discharged to home with family office visits in place teaching done  

## 2013-05-04 ENCOUNTER — Encounter (HOSPITAL_COMMUNITY): Payer: Self-pay | Admitting: General Practice

## 2013-05-04 ENCOUNTER — Inpatient Hospital Stay (HOSPITAL_COMMUNITY)
Admission: AD | Admit: 2013-05-04 | Discharge: 2013-05-15 | DRG: 291 | Disposition: A | Discharge status: Home Health | Payer: Medicare Other | Source: Ambulatory Visit | Attending: Internal Medicine | Admitting: Internal Medicine

## 2013-05-04 ENCOUNTER — Inpatient Hospital Stay (HOSPITAL_COMMUNITY): Payer: Medicare Other

## 2013-05-04 DIAGNOSIS — I255 Ischemic cardiomyopathy: Secondary | ICD-10-CM

## 2013-05-04 DIAGNOSIS — I129 Hypertensive chronic kidney disease with stage 1 through stage 4 chronic kidney disease, or unspecified chronic kidney disease: Secondary | ICD-10-CM | POA: Diagnosis present

## 2013-05-04 DIAGNOSIS — E8770 Fluid overload, unspecified: Secondary | ICD-10-CM

## 2013-05-04 DIAGNOSIS — I5023 Acute on chronic systolic (congestive) heart failure: Principal | ICD-10-CM | POA: Diagnosis present

## 2013-05-04 DIAGNOSIS — Z9861 Coronary angioplasty status: Secondary | ICD-10-CM

## 2013-05-04 DIAGNOSIS — J90 Pleural effusion, not elsewhere classified: Secondary | ICD-10-CM | POA: Diagnosis present

## 2013-05-04 DIAGNOSIS — E46 Unspecified protein-calorie malnutrition: Secondary | ICD-10-CM | POA: Diagnosis present

## 2013-05-04 DIAGNOSIS — M109 Gout, unspecified: Secondary | ICD-10-CM | POA: Diagnosis present

## 2013-05-04 DIAGNOSIS — J9600 Acute respiratory failure, unspecified whether with hypoxia or hypercapnia: Secondary | ICD-10-CM | POA: Diagnosis present

## 2013-05-04 DIAGNOSIS — N179 Acute kidney failure, unspecified: Secondary | ICD-10-CM | POA: Diagnosis not present

## 2013-05-04 DIAGNOSIS — Z7982 Long term (current) use of aspirin: Secondary | ICD-10-CM

## 2013-05-04 DIAGNOSIS — I2589 Other forms of chronic ischemic heart disease: Secondary | ICD-10-CM | POA: Diagnosis present

## 2013-05-04 DIAGNOSIS — Z79899 Other long term (current) drug therapy: Secondary | ICD-10-CM

## 2013-05-04 DIAGNOSIS — E875 Hyperkalemia: Secondary | ICD-10-CM

## 2013-05-04 DIAGNOSIS — I4892 Unspecified atrial flutter: Secondary | ICD-10-CM | POA: Diagnosis present

## 2013-05-04 DIAGNOSIS — Z96649 Presence of unspecified artificial hip joint: Secondary | ICD-10-CM

## 2013-05-04 DIAGNOSIS — I959 Hypotension, unspecified: Secondary | ICD-10-CM | POA: Diagnosis not present

## 2013-05-04 DIAGNOSIS — J189 Pneumonia, unspecified organism: Secondary | ICD-10-CM

## 2013-05-04 DIAGNOSIS — E7800 Pure hypercholesterolemia, unspecified: Secondary | ICD-10-CM | POA: Diagnosis present

## 2013-05-04 DIAGNOSIS — I252 Old myocardial infarction: Secondary | ICD-10-CM

## 2013-05-04 DIAGNOSIS — E872 Acidosis: Secondary | ICD-10-CM

## 2013-05-04 DIAGNOSIS — I509 Heart failure, unspecified: Secondary | ICD-10-CM | POA: Diagnosis present

## 2013-05-04 DIAGNOSIS — E039 Hypothyroidism, unspecified: Secondary | ICD-10-CM | POA: Diagnosis present

## 2013-05-04 DIAGNOSIS — J9601 Acute respiratory failure with hypoxia: Secondary | ICD-10-CM

## 2013-05-04 DIAGNOSIS — K5900 Constipation, unspecified: Secondary | ICD-10-CM | POA: Diagnosis not present

## 2013-05-04 DIAGNOSIS — I2510 Atherosclerotic heart disease of native coronary artery without angina pectoris: Secondary | ICD-10-CM

## 2013-05-04 DIAGNOSIS — Z950 Presence of cardiac pacemaker: Secondary | ICD-10-CM

## 2013-05-04 DIAGNOSIS — E119 Type 2 diabetes mellitus without complications: Secondary | ICD-10-CM

## 2013-05-04 DIAGNOSIS — Z515 Encounter for palliative care: Secondary | ICD-10-CM

## 2013-05-04 DIAGNOSIS — R042 Hemoptysis: Secondary | ICD-10-CM

## 2013-05-04 DIAGNOSIS — G9340 Encephalopathy, unspecified: Secondary | ICD-10-CM | POA: Diagnosis present

## 2013-05-04 DIAGNOSIS — N184 Chronic kidney disease, stage 4 (severe): Secondary | ICD-10-CM | POA: Diagnosis present

## 2013-05-04 DIAGNOSIS — I4891 Unspecified atrial fibrillation: Secondary | ICD-10-CM | POA: Diagnosis present

## 2013-05-04 DIAGNOSIS — E871 Hypo-osmolality and hyponatremia: Secondary | ICD-10-CM | POA: Diagnosis present

## 2013-05-04 LAB — CBC
Hemoglobin: 13.8 g/dL (ref 12.0–15.0)
MCH: 30 pg (ref 26.0–34.0)
MCV: 90 fL (ref 78.0–100.0)
RBC: 4.6 MIL/uL (ref 3.87–5.11)
WBC: 9.5 10*3/uL (ref 4.0–10.5)

## 2013-05-04 LAB — CREATININE, SERUM
Creatinine, Ser: 1.71 mg/dL — ABNORMAL HIGH (ref 0.50–1.10)
GFR calc Af Amer: 31 mL/min — ABNORMAL LOW (ref >90)
GFR calc non Af Amer: 27 mL/min — ABNORMAL LOW (ref >90)

## 2013-05-04 LAB — COMPREHENSIVE METABOLIC PANEL
AST: 42 U/L — ABNORMAL HIGH (ref 0–37)
Albumin: 2.5 g/dL — ABNORMAL LOW (ref 3.5–5.2)
BUN: 57 mg/dL — ABNORMAL HIGH (ref 6–23)
Calcium: 9.2 mg/dL (ref 8.4–10.5)
Creatinine, Ser: 1.69 mg/dL — ABNORMAL HIGH (ref 0.50–1.10)
Total Protein: 6.2 g/dL (ref 6.0–8.3)

## 2013-05-04 MED ORDER — LEVALBUTEROL HCL 0.63 MG/3ML IN NEBU
0.6300 mg | INHALATION_SOLUTION | Freq: Four times a day (QID) | RESPIRATORY_TRACT | Status: DC
  Administered 2013-05-04: 0.63 mg via RESPIRATORY_TRACT
  Filled 2013-05-04: qty 3

## 2013-05-04 MED ORDER — ONDANSETRON HCL 4 MG PO TABS: 4.0000 mg | ORAL_TABLET | Freq: Four times a day (QID) | ORAL | Status: DC | PRN

## 2013-05-04 MED ORDER — SODIUM CHLORIDE 0.9 % IJ SOLN: 3.0000 mL | INTRAMUSCULAR | Status: DC | PRN

## 2013-05-04 MED ORDER — LEVALBUTEROL HCL 0.63 MG/3ML IN NEBU
0.6300 mg | INHALATION_SOLUTION | Freq: Two times a day (BID) | RESPIRATORY_TRACT | Status: DC
  Administered 2013-05-05: 0.63 mg via RESPIRATORY_TRACT
  Filled 2013-05-04 (×3): qty 3

## 2013-05-04 MED ORDER — ISOSORBIDE MONONITRATE ER 30 MG PO TB24
30.0000 mg | ORAL_TABLET | Freq: Every day | ORAL | Status: DC
  Administered 2013-05-05 – 2013-05-14 (×10): 30 mg via ORAL
  Filled 2013-05-04 (×11): qty 1

## 2013-05-04 MED ORDER — ACETAMINOPHEN 650 MG RE SUPP: 650.0000 mg | Freq: Four times a day (QID) | RECTAL | Status: DC | PRN

## 2013-05-04 MED ORDER — OXYCODONE HCL 5 MG PO TABS
5.0000 mg | ORAL_TABLET | ORAL | Status: DC | PRN
  Administered 2013-05-05 – 2013-05-15 (×20): 5 mg via ORAL
  Filled 2013-05-04 (×20): qty 1

## 2013-05-04 MED ORDER — FUROSEMIDE 10 MG/ML IJ SOLN: 80.0000 mg | Freq: Two times a day (BID) | INTRAMUSCULAR | Status: DC

## 2013-05-04 MED ORDER — ALBUTEROL SULFATE (5 MG/ML) 0.5% IN NEBU
2.5000 mg | INHALATION_SOLUTION | RESPIRATORY_TRACT | Status: DC | PRN
  Administered 2013-05-06: 2.5 mg via RESPIRATORY_TRACT
  Filled 2013-05-04: qty 0.5

## 2013-05-04 MED ORDER — LEVOTHYROXINE SODIUM 125 MCG PO TABS
125.0000 ug | ORAL_TABLET | Freq: Every day | ORAL | Status: DC
  Administered 2013-05-05 – 2013-05-15 (×11): 125 ug via ORAL
  Filled 2013-05-04 (×12): qty 1

## 2013-05-04 MED ORDER — FLUTICASONE PROPIONATE 50 MCG/ACT NA SUSP: 2.0000 | Freq: Two times a day (BID) | NASAL | Status: DC | PRN

## 2013-05-04 MED ORDER — SEVELAMER CARBONATE 800 MG PO TABS
800.0000 mg | ORAL_TABLET | Freq: Two times a day (BID) | ORAL | Status: DC
  Administered 2013-05-04 – 2013-05-15 (×22): 800 mg via ORAL
  Filled 2013-05-04 (×25): qty 1

## 2013-05-04 MED ORDER — GUAIFENESIN ER 600 MG PO TB12
600.0000 mg | ORAL_TABLET | Freq: Two times a day (BID) | ORAL | Status: DC
  Administered 2013-05-04 – 2013-05-05 (×2): 600 mg via ORAL
  Filled 2013-05-04 (×3): qty 1

## 2013-05-04 MED ORDER — ATORVASTATIN CALCIUM 40 MG PO TABS
40.0000 mg | ORAL_TABLET | Freq: Every day | ORAL | Status: DC
  Administered 2013-05-04 – 2013-05-14 (×11): 40 mg via ORAL
  Filled 2013-05-04 (×13): qty 1

## 2013-05-04 MED ORDER — ONDANSETRON HCL 4 MG/2ML IJ SOLN: 4.0000 mg | Freq: Four times a day (QID) | INTRAMUSCULAR | Status: DC | PRN

## 2013-05-04 MED ORDER — SODIUM CHLORIDE 0.9 % IJ SOLN
3.0000 mL | Freq: Two times a day (BID) | INTRAMUSCULAR | Status: DC
  Administered 2013-05-06 – 2013-05-10 (×4): 3 mL via INTRAVENOUS

## 2013-05-04 MED ORDER — VITAMIN D (ERGOCALCIFEROL) 1.25 MG (50000 UNIT) PO CAPS
50000.0000 [IU] | ORAL_CAPSULE | ORAL | Status: DC
  Administered 2013-05-05 – 2013-05-15 (×5): 50000 [IU] via ORAL
  Filled 2013-05-04 (×5): qty 1

## 2013-05-04 MED ORDER — PANTOPRAZOLE SODIUM 40 MG PO TBEC
40.0000 mg | DELAYED_RELEASE_TABLET | Freq: Every day | ORAL | Status: DC
  Administered 2013-05-05 – 2013-05-15 (×11): 40 mg via ORAL
  Filled 2013-05-04 (×10): qty 1

## 2013-05-04 MED ORDER — NITROGLYCERIN 0.4 MG SL SUBL
0.4000 mg | SUBLINGUAL_TABLET | SUBLINGUAL | Status: DC | PRN
Start: 2013-05-04 — End: 2013-05-15

## 2013-05-04 MED ORDER — ASPIRIN EC 81 MG PO TBEC
81.0000 mg | DELAYED_RELEASE_TABLET | Freq: Every day | ORAL | Status: DC
  Administered 2013-05-04 – 2013-05-14 (×11): 81 mg via ORAL
  Filled 2013-05-04 (×13): qty 1

## 2013-05-04 MED ORDER — OMEGA-3-ACID ETHYL ESTERS 1 G PO CAPS
1.0000 g | ORAL_CAPSULE | Freq: Every day | ORAL | Status: DC | PRN
  Administered 2013-05-05: 1 g via ORAL
  Filled 2013-05-04 (×2): qty 1

## 2013-05-04 MED ORDER — SODIUM CHLORIDE 0.9 % IJ SOLN
3.0000 mL | Freq: Two times a day (BID) | INTRAMUSCULAR | Status: DC
  Administered 2013-05-04 – 2013-05-10 (×11): 3 mL via INTRAVENOUS

## 2013-05-04 MED ORDER — IPRATROPIUM BROMIDE 0.02 % IN SOLN
0.5000 mg | Freq: Four times a day (QID) | RESPIRATORY_TRACT | Status: DC
  Administered 2013-05-04: 0.5 mg via RESPIRATORY_TRACT
  Filled 2013-05-04: qty 2.5

## 2013-05-04 MED ORDER — ACETAMINOPHEN 325 MG PO TABS
650.0000 mg | ORAL_TABLET | Freq: Four times a day (QID) | ORAL | Status: DC | PRN
  Administered 2013-05-05 – 2013-05-14 (×4): 650 mg via ORAL
  Filled 2013-05-04 (×4): qty 2

## 2013-05-04 MED ORDER — HEPARIN SODIUM (PORCINE) 5000 UNIT/ML IJ SOLN
5000.0000 [IU] | Freq: Three times a day (TID) | INTRAMUSCULAR | Status: DC
  Administered 2013-05-04 – 2013-05-15 (×29): 5000 [IU] via SUBCUTANEOUS
  Filled 2013-05-04 (×35): qty 1

## 2013-05-04 MED ORDER — SODIUM CHLORIDE 0.9 % IV SOLN: 250.0000 mL | INTRAVENOUS | Status: DC | PRN

## 2013-05-04 MED ORDER — ALPRAZOLAM 0.5 MG PO TABS
1.0000 mg | ORAL_TABLET | Freq: Every day | ORAL | Status: DC
  Administered 2013-05-04 – 2013-05-14 (×10): 1 mg via ORAL
  Filled 2013-05-04 (×10): qty 2

## 2013-05-04 MED ORDER — IPRATROPIUM BROMIDE 0.02 % IN SOLN
0.5000 mg | Freq: Two times a day (BID) | RESPIRATORY_TRACT | Status: DC
  Administered 2013-05-05: 0.5 mg via RESPIRATORY_TRACT
  Filled 2013-05-04: qty 2.5

## 2013-05-04 MED ORDER — ALLOPURINOL 100 MG PO TABS
100.0000 mg | ORAL_TABLET | Freq: Every day | ORAL | Status: DC
  Administered 2013-05-05 – 2013-05-15 (×11): 100 mg via ORAL
  Filled 2013-05-04 (×11): qty 1

## 2013-05-04 MED ORDER — FUROSEMIDE 10 MG/ML IJ SOLN
60.0000 mg | Freq: Two times a day (BID) | INTRAMUSCULAR | Status: DC
  Administered 2013-05-05 – 2013-05-06 (×2): 60 mg via INTRAVENOUS
  Filled 2013-05-04 (×4): qty 6

## 2013-05-04 MED ORDER — CARVEDILOL 6.25 MG PO TABS
6.2500 mg | ORAL_TABLET | Freq: Two times a day (BID) | ORAL | Status: DC
  Administered 2013-05-04 – 2013-05-06 (×4): 6.25 mg via ORAL
  Filled 2013-05-04 (×6): qty 1

## 2013-05-04 NOTE — Consult Note (Signed)
Admit date: 05/04/2013 Referring Physician  Jerral Ralph, MD Primary Physician  Avva, MD Primary Cardiologist  Anne Fu, MD Reason for Consultation  Dyspnea  ASSESSMENT: 1. Dyspnea, multifactorial, with a component of heart failure, symptomatic pleural effusion, and deconditioning  2. Chronic systolic heart failure, due to ischemic cardiomyopathy. Last LVEF  3. Stage IV chronic kidney disease  4. Chronic right pleural effusion  5. Diabetes mellitus  6. Coronary artery disease with prior myocardial infarctions and known occlusion of LAD.  7. Atrial fibrillation/flutter, new  PLAN:  1. BNP 2. May need sclerosis of the right pleural space after therapeutic thoracentesis. 3. Will inform Dr.Skains of readmission. 4. Would rhythm control to NSR be helpful? 5. Overall condition is poor and prognosis guarded. 6. Convert Lasix to IV    HPI: The patient was admitted with recurrence of significant shortness of breath 2 days after hospital discharge. Chest x-ray at Dr. Vicente Males  office revealed increased pleural effusion. She denies chest pain. She is able to lay flat. O2 saturations are normal. BUN and creatinine is slightly higher than on discharge 2 days   PMH:   Past Medical History  Diagnosis Date  . Myocardial infarction 1995  . CHF (congestive heart failure)   . Pacemaker   . Hypertension   . High cholesterol   . Pleural effusion on right 04/27/2013    [name] 04/27/2013 (04/27/2013)  . Chronic kidney disease (CKD), stage IV (severe)     [name] 04/27/2013 (04/27/2013)  . Pneumonia 1948; 1954; 02/2013; ?04/27/2013    "real bad the first 2 times"; don't remember how bad in 02/2013; might have it now"  (04/27/2013)  . Exertional shortness of breath     "for the past week" (04/27/2013)  . Hypothyroidism   . Type II diabetes mellitus   . History of blood transfusion 2010    "w/hip replacement" (04/27/2013)  . Arthritis     "back; hands" (04/27/2013)  . Chronic lower back pain   . Gout    "several times; left ankle; fingers" (04/27/2013)     PSH:   Past Surgical History  Procedure Laterality Date  . Coronary angioplasty  1995  . Coronary angioplasty with stent placement  2000    "?1" (04/27/2013)  . Abdominal wall mesh  removal  07/2007    "took the part out that was infected" (04/27/2013)  . Total hip arthroplasty Left 2009  . Total hip arthroplasty Right 2010  . Insert / replace / remove pacemaker  ~ 2011  . Hernia repair  06/2007    "umbilical hernia repair" (04/27/2013)  . Cataract extraction w/ intraocular lens implant Right 05/2012  . Joint replacement    . Thoracentesis Right 04/28/2013    Allergies:  Morphine and related; Phenergan; Prednisone; and Trazodone and nefazodone Prior to Admit Meds:   Prescriptions prior to admission  Medication Sig Dispense Refill  . acetaminophen (TYLENOL) 500 MG tablet Take 1,000 mg by mouth daily as needed for pain.      Marland Kitchen allopurinol (ZYLOPRIM) 100 MG tablet Take 100 mg by mouth every morning.       Marland Kitchen ALPRAZolam (XANAX) 1 MG tablet Take 1 mg by mouth at bedtime.      Marland Kitchen aspirin EC 81 MG tablet Take 81 mg by mouth at bedtime.      Marland Kitchen atorvastatin (LIPITOR) 40 MG tablet Take 40 mg by mouth at bedtime.       . carvedilol (COREG) 6.25 MG tablet Take 1 tablet (6.25 mg total) by  mouth 2 (two) times daily with a meal.  60 tablet  0  . fluticasone (FLONASE) 50 MCG/ACT nasal spray Place 2 sprays into the nose 2 (two) times daily as needed for rhinitis or allergies.      . furosemide (LASIX) 80 MG tablet Take 1.5 tablets (120 mg total) by mouth 2 (two) times daily.  90 tablet  3  . glyBURIDE (DIABETA) 2.5 MG tablet Take 1.25 mg by mouth daily as needed (if sugar is over 120).      Marland Kitchen guaiFENesin (MUCINEX) 600 MG 12 hr tablet Take 1 tablet (600 mg total) by mouth 2 (two) times daily.  60 tablet  0  . isosorbide mononitrate (IMDUR) 30 MG 24 hr tablet Take 30 mg by mouth daily.      Marland Kitchen levalbuterol (XOPENEX HFA) 45 MCG/ACT inhaler Inhale 1-2 puffs  into the lungs 3 (three) times daily as needed for wheezing or shortness of breath.      . levothyroxine (SYNTHROID, LEVOTHROID) 125 MCG tablet Take 125 mcg by mouth daily before breakfast.       . Menthol, Topical Analgesic, (BENGAY EX) Apply 1 application topically daily as needed (hand pain).      Marland Kitchen omega-3 acid ethyl esters (LOVAZA) 1 G capsule Take 1 g by mouth every other day.       Marland Kitchen oxycodone (OXY-IR) 5 MG capsule Take 2 capsules (10 mg total) by mouth 3 (three) times daily as needed for pain.  30 capsule  0  . pantoprazole (PROTONIX) 40 MG tablet Take 40 mg by mouth daily.      . Pseudoeph-Doxylamine-DM-APAP (NYQUIL PO) Take by mouth at bedtime as needed (cough). Small capful      . Pseudoephedrine-APAP-DM (DAYQUIL PO) Take by mouth daily as needed (cough). Small capful      . sevelamer carbonate (RENVELA) 800 MG tablet Take 800 mg by mouth 2 (two) times daily. Breakfast and dinner      . Vitamin D, Ergocalciferol, (DRISDOL) 50000 UNITS CAPS Take 50,000 Units by mouth 3 (three) times a week. Approximately every other day      . nitroGLYCERIN (NITROSTAT) 0.4 MG SL tablet Place 0.4 mg under the tongue as needed for chest pain. x3 doses as needed for chest pain       Fam HX:   History reviewed. No pertinent family history. Social HX:    History   Social History  . Marital Status: Widowed    Spouse Name: N/A    Number of Children: N/A  . Years of Education: N/A   Occupational History  . Not on file.   Social History Main Topics  . Smoking status: Never Smoker   . Smokeless tobacco: Never Used  . Alcohol Use: No  . Drug Use: No  . Sexual Activity: No   Other Topics Concern  . Not on file   Social History Narrative  . No narrative on file     Review of Systems: Dyspnea without chest pain  Physical Exam: Blood pressure 126/64, pulse 69, temperature 97.4 F (36.3 C), temperature source Oral, resp. rate 18, height 5\' 5"  (1.651 m), weight 75.7 kg (166 lb 14.2 oz), SpO2  90.00%. Weight change:    With oxygen cannula in her nose, she is comfortable and denies dyspnea  No significant JVD  Marked reduction in breath sounds 1/2-2/3 of the way up the right lung posteriorly. Dullness to percussion.  Regular rhythm. No significant murmurs.  Abdomen soft.  Extremities no edema.  Neurologically surprisingly intact able to give a good history. Memory is good. No focal Labs:   Lab Results  Component Value Date   WBC 8.4 04/29/2013   HGB 12.5 04/29/2013   HCT 38.7 04/29/2013   MCV 90.8 04/29/2013   PLT 199 04/29/2013    Recent Labs Lab 04/27/13 2018  05/02/13 0355  NA 137  < > 132*  K 3.9  < > 4.0  CL 95*  < > 93*  CO2 34*  < > 31  BUN 33*  < > 62*  CREATININE 1.36*  < > 1.80*  CALCIUM 9.6  < > 8.6  PROT 6.6  --   --   BILITOT 0.5  --   --   ALKPHOS 122*  --   --   ALT 9  --   --   AST 29  --   --   GLUCOSE 126*  < > 151*  < > = values in this interval not displayed. No results found for this basename: PTT   Lab Results  Component Value Date   INR 1.16 04/27/2013   INR 1.0 01/21/2009   INR 1.0 07/12/2008   Lab Results  Component Value Date   CKTOTAL 82 01/31/2013   CKMB 7.0* 01/31/2013   TROPONINI <0.30 01/31/2013     Radiology: No chest x-ray today  EKG:  Ventricular paced rhythm with either atrial fibrillation or atrial flutter as the underlying rhythm. ECHOCARDIOGRAM: 04/29/2013 ------------------------------------------------------------ LV EF: 30% - 35%  ------------------------------------------------------------ Indications: Dyspnea 786.09.  ------------------------------------------------------------ History: PMH: Coronary artery disease. Ischemic cardiomyopathy. Risk factors: Diabetes mellitus.  ------------------------------------------------------------ Study Conclusions  - Left ventricle: The cavity size was moderately dilated. Wall thickness was normal. Systolic function was moderately to severely reduced. The  estimated ejection fraction was in the range of 30% to 35%. Dyskinesis of the entireanteroseptal and apical myocardium. - Mitral valve: Mild regurgitation. - Left atrium: The atrium was mildly dilated. - Right atrium: The atrium was mildly dilated. - Pulmonary arteries: PA peak pressure: 34mm Hg (S).   Lesleigh Noe 05/04/2013 4:36 PM

## 2013-05-04 NOTE — H&P (Signed)
PATIENT DETAILS Name: Miranda Cruz Age: 79 y.o. Sex: female Date of Birth: 1930/08/28 Admit Date: 05/04/2013 WUJ:WJXB,JYNWGNFAOZ R, MD   CHIEF COMPLAINT:  Shortness of breath-worsening this morning  HPI: Miranda Cruz is a 79 y.o. female with a Past Medical History of chronic systolic heart failure, ischemic cardiomyopathy, stage III-IV chronic kidney disease who presents today with the above noted complaint. Patient just was discharged from the hospital a few days ago, she claims upon discharge she was doing well, and felt that she was back to her baseline. This morning, patient fell that she was much more short of breath, and felt that if she did not seek medical attention she would get much worse. She went to her primary care practitioner's office, where a chest x-ray apparently demonstrated worsening of the pleural effusion, she was then referred to the hospitalist service as a direct admission. Upon further evaluation, patient was not able to clarify whether the shortness of breath was at exertion or rest. She does claim that she has worsening lower extremity edema. Upon my evaluation, she was easily able to lie flat. She does claim that she had a nagging cough over the past few days, it is at times productive. She denies any fever. History of nausea, vomiting or diarrhea. She is now being admitted for further evaluation and treatment   ALLERGIES:   Allergies  Allergen Reactions  . Morphine And Related Other (See Comments)    Made her loopy  . Phenergan [Promethazine Hcl] Other (See Comments)    Made her crazy  . Prednisone Other (See Comments)    Confusion, bad dreams, insomnia  . Trazodone And Nefazodone Other (See Comments)    hallucinations     PAST MEDICAL HISTORY: Past Medical History  Diagnosis Date  . Myocardial infarction 1995  . CHF (congestive heart failure)   . Pacemaker   . Hypertension   . High cholesterol   . Pleural effusion on right 04/27/2013    [name]  04/27/2013 (04/27/2013)  . Chronic kidney disease (CKD), stage IV (severe)     [name] 04/27/2013 (04/27/2013)  . Pneumonia 1948; 1954; 02/2013; ?04/27/2013    "real bad the first 2 times"; don't remember how bad in 02/2013; might have it now"  (04/27/2013)  . Exertional shortness of breath     "for the past week" (04/27/2013)  . Hypothyroidism   . Type II diabetes mellitus   . History of blood transfusion 2010    "w/hip replacement" (04/27/2013)  . Arthritis     "back; hands" (04/27/2013)  . Chronic lower back pain   . Gout     "several times; left ankle; fingers" (04/27/2013)    PAST SURGICAL HISTORY: Past Surgical History  Procedure Laterality Date  . Coronary angioplasty  1995  . Coronary angioplasty with stent placement  2000    "?1" (04/27/2013)  . Abdominal wall mesh  removal  07/2007    "took the part out that was infected" (04/27/2013)  . Total hip arthroplasty Left 2009  . Total hip arthroplasty Right 2010  . Insert / replace / remove pacemaker  ~ 2011  . Hernia repair  06/2007    "umbilical hernia repair" (04/27/2013)  . Cataract extraction w/ intraocular lens implant Right 05/2012  . Joint replacement    . Thoracentesis Right 04/28/2013    MEDICATIONS AT HOME: Prior to Admission medications   Medication Sig Start Date End Date Taking? Authorizing Provider  carvedilol (COREG) 6.25 MG tablet Take 1 tablet (6.25  mg total) by mouth 2 (two) times daily with a meal. 05/02/13  Yes Adeline C Viyuoh, MD  furosemide (LASIX) 80 MG tablet Take 1.5 tablets (120 mg total) by mouth 2 (two) times daily. 05/02/13  Yes Adeline Joselyn Glassman, MD  oxycodone (OXY-IR) 5 MG capsule Take 2 capsules (10 mg total) by mouth 3 (three) times daily as needed for pain. 05/02/13  Yes Adeline Joselyn Glassman, MD  acetaminophen (TYLENOL) 500 MG tablet Take 1,000 mg by mouth daily as needed for pain.    Historical Provider, MD  allopurinol (ZYLOPRIM) 100 MG tablet Take 100 mg by mouth daily.    Historical Provider, MD   ALPRAZolam Prudy Feeler) 1 MG tablet Take 1 mg by mouth at bedtime.    Historical Provider, MD  aspirin EC 81 MG tablet Take 81 mg by mouth at bedtime.    Historical Provider, MD  atorvastatin (LIPITOR) 40 MG tablet Take 40 mg by mouth at bedtime.     Historical Provider, MD  fluticasone (FLONASE) 50 MCG/ACT nasal spray Place 2 sprays into the nose 2 (two) times daily as needed for rhinitis or allergies.    Historical Provider, MD  glyBURIDE (DIABETA) 2.5 MG tablet Take 1.25 mg by mouth daily as needed (if sugar is over 120).    Historical Provider, MD  guaiFENesin (MUCINEX) 600 MG 12 hr tablet Take 1 tablet (600 mg total) by mouth 2 (two) times daily. 02/11/13   Ripudeep Jenna Luo, MD  isosorbide mononitrate (IMDUR) 30 MG 24 hr tablet Take 30 mg by mouth daily.    Historical Provider, MD  levalbuterol [facility] HFA) 45 MCG/ACT inhaler Inhale 1-2 puffs into the lungs 3 (three) times daily as needed for wheezing or shortness of breath. 02/11/13   Ripudeep Jenna Luo, MD  levothyroxine (SYNTHROID, LEVOTHROID) 125 MCG tablet Take 125 mcg by mouth daily.    Historical Provider, MD  Menthol, Topical Analgesic, (BENGAY EX) Apply 1 application topically daily as needed (hand pain).    Historical Provider, MD  nitroGLYCERIN (NITROSTAT) 0.4 MG SL tablet Place 0.4 mg under the tongue as needed for chest pain.    Historical Provider, MD  omega-3 acid ethyl esters (LOVAZA) 1 G capsule Take 1 g by mouth daily as needed (constipation).     Historical Provider, MD  pantoprazole (PROTONIX) 40 MG tablet Take 40 mg by mouth daily.    Historical Provider, MD  Pseudoeph-Doxylamine-DM-APAP (NYQUIL PO) Take by mouth at bedtime as needed (cough). Small capful    Historical Provider, MD  Pseudoephedrine-APAP-DM (DAYQUIL PO) Take by mouth daily as needed (cough). Small capful    Historical Provider, MD  sevelamer carbonate (RENVELA) 800 MG tablet Take 800 mg by mouth 2 (two) times daily. Breakfast and dinner    Historical Provider, MD   Vitamin D, Ergocalciferol, (DRISDOL) 50000 UNITS CAPS Take 50,000 Units by mouth 3 (three) times a week. Approximately every other day    Historical Provider, MD    FAMILY HISTORY: History reviewed. No pertinent family history.  SOCIAL HISTORY:  reports that she has never smoked. She has never used smokeless tobacco. She reports that she does not drink alcohol or use illicit drugs.  REVIEW OF SYSTEMS:  Constitutional:   No  weight loss, night sweats,  Fevers, chills, fatigue.  HEENT:    No headaches, Difficulty swallowing,Tooth/dental problems,Sore throat,  No sneezing, itching, ear ache, nasal congestion, post nasal drip,   Cardio-vascular: No chest pain,  Orthopnea, PND,  anasarca, dizziness, palpitations  GI:  No heartburn, indigestion, abdominal pain, nausea, vomiting, diarrhea, change in  bowel habits, loss of appetite  Resp:  No coughing up of blood.No change in color of mucus.No chest wall deformity  Skin:  no rash or lesions.  GU:  no dysuria, change in color of urine, no urgency or frequency.  No flank pain.  Musculoskeletal: No joint pain or swelling.  No decreased range of motion.  No back pain.  Psych: No change in mood or affect. No depression or anxiety.  No memory loss.   PHYSICAL EXAM: Blood pressure 126/64, pulse 69, temperature 97.4 F (36.3 C), temperature source Oral, resp. rate 18, height 5\' 5"  (1.651 m), weight 75.7 kg (166 lb 14.2 oz), SpO2 90.00%.  General appearance :Awake, alert, not in any distress. Speech Clear. Not toxic Looking HEENT: Atraumatic and Normocephalic, pupils equally reactive to light and accomodation Neck: supple, no JVD. No cervical lymphadenopathy.  Chest:Good air entry bilaterally, coarse rhonchi are heard mostly during expiration. Few bibasilar rales. CVS: S1 S2 regular, no murmurs.  Abdomen: Bowel sounds present, Non tender and not distended with no gaurding, rigidity or rebound. Extremities: B/L Lower Ext shows 1  +edema, both legs are warm to touch Neurology: Awake alert, and oriented X 3, CN II-XII intact, Non focal Skin:No Rash Wounds:N/A  LABS ON ADMISSION:   Recent Labs  05/02/13 0355  NA 132*  K 4.0  CL 93*  CO2 31  GLUCOSE 151*  BUN 62*  CREATININE 1.80*  CALCIUM 8.6   No results found for this basename: AST, ALT, ALKPHOS, BILITOT, PROT, ALBUMIN,  in the last 72 hours No results found for this basename: LIPASE, AMYLASE,  in the last 72 hours No results found for this basename: WBC, NEUTROABS, HGB, HCT, MCV, PLT,  in the last 72 hours No results found for this basename: CKTOTAL, CKMB, CKMBINDEX, TROPONINI,  in the last 72 hours No results found for this basename: DDIMER,  in the last 72 hours No components found with this basename: POCBNP,    RADIOLOGIC STUDIES ON ADMISSION: No results found.   EKG: Independently reviewed. Paced rhythm  ASSESSMENT AND PLAN: Present on Admission:  . Shortness of breath - Suspect this is multifactorial with mild decompensated chronic systolic heart failure, pleural effusion, and some component of reactive airway disease (patient wheezing) - Will admit to telemetry, start on IV Lasix, scheduled nebulized bronchodilators.Check daily weights and strict I and O's - Will recheck a chest x-ray today, if it shows significant worsening of pleural effusion, then we can consider consulting PCCM for thoracocentesis - Will consult cardiology as well   . CAD (coronary artery disease) - Continue with aspirin, Coreg and statins  . Cardiomyopathy, ischemic - As above   . CKD (chronic kidney disease) stage 4, GFR 15-29 ml/min - Checking electrolytes to see if she is close to her usual baseline.  - Monitor electrolytes closely as she is on Lasix   . Pleural effusion - right - Suspected secondary to CHF. - Await chest x-ray, to see if any further worsening compared to previous  Further plan will depend as patient's clinical course evolves and further  radiologic and laboratory data become available. Patient will be monitored closely.   DVT Prophylaxis: Prophylactic Heparin  Code Status: Full Code  Total time spent for admission equals 45 minutes.  [facility] Pager [phone]  If 7PM-7AM, please contact night-coverage [email or web address] Password [facility] 05/04/2013, 3:38 PM

## 2013-05-04 NOTE — Progress Notes (Signed)
Report given to night nurse, patient is stable.  D. Churchill Grimsley RN 

## 2013-05-04 NOTE — Progress Notes (Signed)
Patient's Lasix will be held until lab results, and other testing has been completed per MD; will continue to monitor patient. Lorretta Harp RN

## 2013-05-04 NOTE — Progress Notes (Signed)
   Miranda Cruz, is a 79 y.o. female, DOB - 1930/08/16, AVW:[id number]   Recently discharged after 1.5 lit Pl fluid removed last admission (?chf), now coming from PCP Dr Vicente Males office as she has developed SOB despite 120mg  Lasix BID, office CXR show ++ Pl effusion, coming back for repeat Thoracentesis, Pulm/IR needs to be called. PCP has told me patient is Stable for direct admit.

## 2013-05-04 NOTE — Progress Notes (Signed)
Chaplain visited patient and her two daughters.  Patient was resting in the bed, awake and oriented.  Patient's family was at bedside and supportive of patient.  Chaplain offered spiritual support through compassionate listening and prayer. Patient and family responded with appreciation.  Chaplain will follow up as necessary.     05/04/13 1900  Clinical Encounter Type  Visited With Patient and family together  Visit Type Spiritual support  Referral From Physician  Spiritual Encounters  Spiritual Needs Prayer;Emotional  Stress Factors  Patient Stress Factors Health changes  Family Stress Factors Major life changes  Rulon Abide

## 2013-05-04 NOTE — Progress Notes (Signed)
Patient has arrived to unit directly admitted; patient assessed and is stable; will continue to monitor patient. Lorretta Harp RN

## 2013-05-05 DIAGNOSIS — E8779 Other fluid overload: Secondary | ICD-10-CM

## 2013-05-05 DIAGNOSIS — J9600 Acute respiratory failure, unspecified whether with hypoxia or hypercapnia: Secondary | ICD-10-CM

## 2013-05-05 DIAGNOSIS — J90 Pleural effusion, not elsewhere classified: Secondary | ICD-10-CM

## 2013-05-05 DIAGNOSIS — Z950 Presence of cardiac pacemaker: Secondary | ICD-10-CM

## 2013-05-05 LAB — CBC
Hemoglobin: 13.3 g/dL (ref 12.0–15.0)
MCH: 29.6 pg (ref 26.0–34.0)
MCHC: 32.4 g/dL (ref 30.0–36.0)
Platelets: 197 10*3/uL (ref 150–400)
RBC: 4.49 MIL/uL (ref 3.87–5.11)

## 2013-05-05 LAB — GLUCOSE, CAPILLARY: Glucose-Capillary: 192 mg/dL — ABNORMAL HIGH (ref 70–99)

## 2013-05-05 LAB — BASIC METABOLIC PANEL
CO2: 28 mEq/L (ref 19–32)
Chloride: 93 mEq/L — ABNORMAL LOW (ref 96–112)
Potassium: 4.1 mEq/L (ref 3.5–5.1)
Sodium: 134 mEq/L — ABNORMAL LOW (ref 135–145)

## 2013-05-05 MED ORDER — GLUCERNA SHAKE PO LIQD
237.0000 mL | ORAL | Status: DC
  Administered 2013-05-05 – 2013-05-15 (×7): 237 mL via ORAL

## 2013-05-05 NOTE — Progress Notes (Signed)
I was called by RN notified of patient having a blood pressure of 87/45. I personally assessed patient, she did not appear to be in acute distress, appearing comfortable mentating well. Given hypotension I instructed RN to hold this evening's dose of lasix. Will follow.

## 2013-05-05 NOTE — Progress Notes (Signed)
[facility] PROGRESS NOTE  EULAR PANEK JYN:[id number] DOB: 1929-12-19 DOA: 05/04/2013 PCP: Hoyle Sauer, MD  Assessment/Plan: Recurrent right-sided pleural effusion  Patient with a recent hospitalization, discharged on 05/02/2013 at which time she underwent ultrasound-guided thoracentesis, yielding 1.5 L of yellow fluid. Recurrent right-sided pleural effusion likely secondary to congestive heart failure. I discussed case with pulmonary medicine who will perform bedside thoracentesis. Discussed about the possibility of a pleurodesed is, will await further recommendations from pulmonary medicine. Appreciate assistance.  Acute hypoxemic respiratory failure Likely secondary to recurrent right-sided pleural effusion. Patient presented yesterday to the department with chest x-ray showing reaccumulation of right pleural effusion. Patient to undergo bedside thoracentesis today. Await further recommendations from pulmonary medicine.  Congestive heart failure, systolic Patient with history of ischemic cardiac myopathy, having an ejection fraction of 30-35% based on transthoracic echocardiogram on 04/29/2013. Continue Lasix 60 mg IV twice a day.  Stage III chronic kidney disease Patient's baseline creatinine fluctuating between a 1.7 and 1.9. Currently stable.  Type 2 diabetes mellitus Patient's blood sugar is below 182. We'll continue status he'll coverage with Accu-Cheks q. a.c. and each bedtime     Code Status: Full code Plan has been discussed with patient    Consultants:  Pulmonary medicine  Procedures:  Anticipate ultrasound-guided thoracentesis today     HPI/Subjective: Patient is a pleasant 79 year old female with a past medical history of ischemic cardiomyopathy, ejection fraction 30-35%, with a history of recurrent right-sided pleural effusions. She was recently discharged from the medicine service on 05/02/2013 she underwent ultrasound-guided thoracentesis for a  right-sided pleural effusion. It is felt that the recurrent pleural effusion is secondary to congestive heart failure. Despite taking Lasix therapy, she at developed no worsening shortness of breath for which she saw her primary care provider, referred to the emergency department where x-ray showed reaccumulation of right-sided pleural effusion. I discussed case with pulmonary medicine, who will evaluate patient today and do bedside thoracentesis. Patient's meanwhile complains of ongoing shortness of breath.  Objective: Filed Vitals:   05/05/13 0836  BP:   Pulse: 70  Temp:   Resp: 18    Intake/Output Summary (Last 24 hours) at 05/05/13 1314 Last data filed at 05/05/13 1200  Gross per 24 hour  Intake    580 ml  Output    260 ml  Net    320 ml   Filed Weights   05/04/13 1508 05/05/13 0648  Weight: 75.7 kg (166 lb 14.2 oz) 76.386 kg (168 lb 6.4 oz)    Exam:   General:  Patient in mild respiratory distress. She is awake alert oriented times  Cardiovascular: Regular rate and rhythm normal S1-S2  Respiratory: Lungs patient having diminished breath sounds to right side. Left lung is clear to auscultation bilaterally  Abdomen: Abdomen is soft nontender nontender positive bowel sound  Musculoskeletal: Present range of motion of all extremities  Data Reviewed: Basic Metabolic Panel:  Recent Labs Lab 04/30/13 0500 05/01/13 0410 05/02/13 0355 05/04/13 1610 05/05/13 0600  NA 134* 129* 132* 134* 134*  K 3.5 3.6 4.0 4.5 4.1  CL 95* 91* 93* 94* 93*  CO2 32 26 31 30 28   GLUCOSE 159* 176* 151* 164* 182*  BUN 54* 57* 62* 57* 58*  CREATININE 1.85* 1.60* 1.80* 1.69*  1.71* 1.71*  CALCIUM 8.9 8.9 8.6 9.2 8.9   Liver Function Tests:  Recent Labs Lab 05/04/13 1610  AST 42*  ALT 15  ALKPHOS 131*  BILITOT 0.4  PROT 6.2  ALBUMIN 2.5*  No results found for this basename: LIPASE, AMYLASE,  in the last 168 hours No results found for this basename: AMMONIA,  in the last 168  hours CBC:  Recent Labs Lab 04/29/13 0413 05/04/13 1610 05/05/13 0600  WBC 8.4 9.5 9.1  HGB 12.5 13.8 13.3  HCT 38.7 41.4 41.0  MCV 90.8 90.0 91.3  PLT 199 180 197   Cardiac Enzymes:  Recent Labs Lab 05/04/13 1610  TROPONINI <0.30   BNP (last 3 results)  Recent Labs  04/27/13 2019  PROBNP 6703.0*   CBG:  Recent Labs Lab 05/01/13 2113 05/02/13 0559 05/02/13 1110 05/02/13 1609 05/05/13 0733  GLUCAP 167* 143* 195* 169* 192*    Recent Results (from the past 240 hour(s))  BODY FLUID CULTURE     Status: None   Collection Time    04/28/13  3:16 PM      Result Value Range Status   Specimen Description PLEURAL FLUID RIGHT   Final   Special Requests FLUID   Final   Gram Stain     Final   Value: RARE WBC PRESENT,BOTH PMN AND MONONUCLEAR     NO ORGANISMS SEEN     Performed at [facility]   Culture     Final   Value: NO GROWTH 3 DAYS     Performed at [facility]   Report Status 05/02/2013 FINAL   Final     Studies: Dg Chest 2 View  05/04/2013   *RADIOLOGY REPORT*  Clinical Data: Follow up pleural effusion.  CHEST - 2 VIEW  Comparison: 04/28/2013  Findings: Moderate to large right pleural effusion is now present, reaccumulated from the prior exam.  There is perihilar opacity on the right that is likely atelectasis.  There is a minimal left pleural effusion.  There is no overt pulmonary edema.  The cardiac silhouette is mildly enlarged.  There is no pneumothorax.  The left anterior chest wall sequential pacemaker is stable well- positioned.  IMPRESSION: Moderate to large right pleural effusion, which has reaccumulated since the prior chest radiograph.  There is associated right perihilar lung base atelectasis.  No overt pulmonary edema. Minimal left pleural effusion.   Original Report Authenticated By: Amie Portland, M.D.    Scheduled Meds: . allopurinol  100 mg Oral Daily  . ALPRAZolam  1 mg Oral QHS  . aspirin EC  81 mg Oral QHS  .  atorvastatin  40 mg Oral QHS  . carvedilol  6.25 mg Oral BID WC  . furosemide  60 mg Intravenous BID  . guaiFENesin  600 mg Oral BID  . heparin  5,000 Units Subcutaneous Q8H  . ipratropium  0.5 mg Nebulization BID  . isosorbide mononitrate  30 mg Oral Daily  . levalbuterol  0.63 mg Nebulization BID  . levothyroxine  125 mcg Oral QAC breakfast  . pantoprazole  40 mg Oral Daily  . sevelamer carbonate  800 mg Oral BID WC  . sodium chloride  3 mL Intravenous Q12H  . sodium chloride  3 mL Intravenous Q12H  . Vitamin D (Ergocalciferol)  50,000 Units Oral Q M,W,F   Continuous Infusions:   Principal Problem:   Acute on chronic systolic CHF (congestive heart failure) Active Problems:   CKD (chronic kidney disease) stage 4, GFR 15-29 ml/min   Cardiomyopathy, ischemic   CAD (coronary artery disease)   Encephalopathy acute   Pleural effusion - right   Cardiac pacemaker in situ    Time spent: 40 min  Jeralyn Bennett  [facility] Pager [phone] 7PM-7AM, please contact night-coverage at [email or web address], password [facility] 05/05/2013, 1:14 PM  LOS: 1 day

## 2013-05-05 NOTE — Progress Notes (Addendum)
Called by family member concerned about patients low BP.  On arrival Dr. Vanessa Barbara present in room assessing patient and speaking with family. Chart reviewed noted CCM and cardiology notes.  After his assessment I introduced myself as RRT RN and our role in this case.  We went together to the patients room and I did an manual BP left arm.  98/48. Vpacing at 70.  RR 20 O2 sat 97% on 3 liters. Patient alert warm and dry mentation intact - denies dizziness - bil BS coarse - wet cough - NAD - not using accessory muscles. No pericardial rubs noted.   Patient states she feels ok - not much different.  MD orders noted to hold pm Lasix and watch BP.  Discussed with RN Lawanna Kobus who will handoff communication to the night shift RN.  Handoff to nighttime RRT RN April by this RN.  Family members updated.

## 2013-05-05 NOTE — Progress Notes (Signed)
Chaplain responded to request for chaplain visit from daughter-in-law, Robynn Pane. Spoke with nurse who described their relationship as close and strong. Met with Chestine Spore briefly, observed strong family support from [place] to pt. Pt confirmed that she has good family support from [place] and her other children. Pt expressed that she is "worried" about her medications and their affect upon her kidneys. Provided emotional support and counseling for pt. She expressed appreciation for her nurses and for the chaplain's visit.

## 2013-05-05 NOTE — Consult Note (Signed)
PULMONARY/CCM NOTE  Requesting MD/Service: Zamora/TRH Date of admission: 8/28 Date of consult: 8/29 Reason for consultation: recurrent pl eff   HPI/Pt Profile:  79 yo F with cardiomyopathy, CKD and recent hospitalization for dyspnea that improved markedly after R thoracentesis that yielded 1.5 liters of transudative fluid. She was readmitted a mere 2 days after discharge with recurrent DOE and CXR revealing re-accumulation of her R effusion. She denies fever, chills, sweats, productive cough, hemoptysis. PCCM is asked to assist in further eval and consider repeat thoracentesis    Past Medical History  Diagnosis Date  . Myocardial infarction 1995  . CHF (congestive heart failure)   . Pacemaker   . Hypertension   . High cholesterol   . Pleural effusion on right 04/27/2013    [name] 04/27/2013 (04/27/2013)  . Chronic kidney disease (CKD), stage IV (severe)     [name] 04/27/2013 (04/27/2013)  . Pneumonia 1948; 1954; 02/2013; ?04/27/2013    "real bad the first 2 times"; don't remember how bad in 02/2013; might have it now"  (04/27/2013)  . Exertional shortness of breath     "for the past week" (04/27/2013)  . Hypothyroidism   . Type II diabetes mellitus   . History of blood transfusion 2010    "w/hip replacement" (04/27/2013)  . Arthritis     "back; hands" (04/27/2013)  . Chronic lower back pain   . Gout     "several times; left ankle; fingers" (04/27/2013)    MEDICATIONS: reviewed  History   Social History  . Marital Status: Widowed    Spouse Name: N/A    Number of Children: N/A  . Years of Education: N/A   Occupational History  . Not on file.   Social History Main Topics  . Smoking status: Never Smoker   . Smokeless tobacco: Never Used  . Alcohol Use: No  . Drug Use: No  . Sexual Activity: No   Other Topics Concern  . Not on file   Social History Narrative  . No narrative on file    History reviewed. No pertinent family history.  ROS - No anginal or pleuritic CP. No  LE edema or calf tenderness. + orthopnea  Filed Vitals:   05/05/13 0109 05/05/13 0648 05/05/13 0836 05/05/13 1449  BP: 113/56 137/66  115/72  Pulse: 70 70 70 70  Temp: 98.2 F (36.8 C) 97.9 F (36.6 C)  98.6 F (37 C)  TempSrc: Oral Oral  Oral  Resp: 16 18 18 18   Height:      Weight:  76.386 kg (168 lb 6.4 oz)    SpO2: 98% 96% 98% 97%    EXAM:  Gen: elderly, chronically ill appearing, no overt resp distress HEENT: Poor dentition, otherwise WNL Neck: No JVD noted Lungs: dull to percussion with bronchial BS approx 1/2 up on R. No wheezes Cardiovascular: RRR, no M noted Abdomen: soft, NT, NABS Ext: warm, no edema Neuro: grossly intact  DATA:  BMET    Component Value Date/Time   NA 134* 05/05/2013 0600   K 4.1 05/05/2013 0600   CL 93* 05/05/2013 0600   CO2 28 05/05/2013 0600   GLUCOSE 182* 05/05/2013 0600   BUN 58* 05/05/2013 0600   CREATININE 1.71* 05/05/2013 0600   CALCIUM 8.9 05/05/2013 0600   GFRNONAA 27* 05/05/2013 0600   GFRAA 31* 05/05/2013 0600    CBC    Component Value Date/Time   WBC 9.1 05/05/2013 0600   RBC 4.49 05/05/2013 0600   HGB 13.3 05/05/2013 0600  HCT 41.0 05/05/2013 0600   PLT 197 05/05/2013 0600   MCV 91.3 05/05/2013 0600   MCH 29.6 05/05/2013 0600   MCHC 32.4 05/05/2013 0600   RDW 14.2 05/05/2013 0600   LYMPHSABS 0.8 02/05/2013 1235   MONOABS 0.4 02/05/2013 1235   EOSABS 0.0 02/05/2013 1235   BASOSABS 0.0 02/05/2013 1235    CXR: moderate R effusion, markedly increased since post thoracentesis film of 8/22 and approx same as pre-thora fil  ECHO 8/23: LVEF 30-35%  EKG: paced rhythm  IMPRESSION:   Recurrent transudative R pleural effusion due to severe ischemic CM (systolic CHF)  Rapid reaccumulation after thoracentesis 8/22  DISC: Repeated thoracentesis is not usually a satisfactory strategy for transudative pleural effusions unless the underlying cause can be adequately treated medically. Each thoracentesis carries a slightly higher risk of pneumothorax  than the previous. Also, performing a thoracentesis now would impair our ability to assess the effectiveness of medical mgmt since resolution of the effusion would be the most effective marker of effectiveness. Pleurodesis tends to be relatively ineffective in effusions caused by CHF because of the high rate of fluid formation and difficulty keeping the pleural surfaces apposed. Further, optimal pleurodesis requires a VATS or at least a standard chest tube. She is not a good candidate for either of these procedures unless all else fails and she is willing to accept the risk and discomfort. Therefore, I propose the following:  PLAN:  Maximize medical mgmt of CHF including aggressive diuresis Recheck CXR Sunday 8/31. If effusion is no better, consider repeat thoracentesis vs placement of PleurX catheter to allow repeated drainage of pleural space over time   I discussed the above plan and its rationale with the patients daughter in law who is an Charity fundraiser. She was somewhat contentious and expressed her frustration that I did not intend to perform a thoracentesis today. I assured her that Ms Rape would be monitored closely and that there were several excellent MDs providing thoughtful care to the best of our ability.  Billy Fischer, MD ; [facility] [phone].  After 5:30 PM or weekends, call [phone]

## 2013-05-05 NOTE — Progress Notes (Signed)
Utilization review completed. Amey Hossain, RN, BSN. 

## 2013-05-05 NOTE — Progress Notes (Signed)
INITIAL NUTRITION ASSESSMENT  DOCUMENTATION CODES Per approved criteria  -Not Applicable   INTERVENTION: Add Glucerna Shake po daily, each supplement provides 220 kcal and 10 grams of protein. Downgrade diet to Dysphagia 3 diet given poor dentition. RD provided "Low Sodium Nutrition Therapy" and "Dysphagia 3 Recommended Food Lists" during this visit. RD to continue to follow nutrition care plan.  NUTRITION DIAGNOSIS: Inadequate oral intake related to poor dentition as evidenced by dietary recall and recent non-significant weight loss.   Goal: Intake to meet >90% of estimated nutrition needs.  Monitor:  weight trends, lab trends, I/O's, PO intake, supplement tolerance  Reason for Assessment: Malnutrition Screening Tool  79 y.o. female  Admitting Dx: Acute on chronic systolic CHF (congestive heart failure)  ASSESSMENT: PMHx significant for CHF, ischemic cardiomyopathy, stage III-IV CKD. Patient with a recent hospitalization, discharged on 8/26 at which time she underwent ultrasound-guided thoracentesis, yielding 1.5 L of yellow fluid. Admitted with increasing SOB. Work-up reveals worsening pleural effusion and acute-on-chronic CHF.  Pt with 4% wt loss x 2 months. Currently on a Carbohydrate Modified Medium diet, consuming 50 - 100% of meals. Pt reports that she requires soft foods 2/2 recent dental procedure. Dietary recall reveals minimal protein intake 2/2 dentition. Discussed soft protein foods - such as peanut butter, cottage cheese, beans, eggs, etc to help meet protein needs. Pt verbalized understanding.  RD provided "Low Sodium Nutrition Therapy" information as well as list of Dysphagia 3 foods to help guide food choice selection when she d/c's home. Pt appreciative of information. Pt agreeable to Glucerna Shake while here to help meet protein needs.  Pt is at nutrition risk given advanced age, minimal protein intake PTA and weight trending down.  Height: Ht Readings from Last  1 Encounters:  05/04/13 5\' 5"  (1.651 m)    Weight: Wt Readings from Last 1 Encounters:  05/05/13 168 lb 6.4 oz (76.386 kg)    Ideal Body Weight: 125 lb  % Ideal Body Weight: 134%  Wt Readings from Last 10 Encounters:  05/05/13 168 lb 6.4 oz (76.386 kg)  05/02/13 169 lb 12.1 oz (77 kg)  02/11/13 184 lb 15.5 oz (83.9 kg)    Usual Body Weight: 175 lb (per pt report)  % Usual Body Weight: 96%  BMI:  Body mass index is 28.02 kg/(m^2). Overweight.  Estimated Nutritional Needs: Kcal: 1400 - 1600 Protein: 55 - 65 g Fluid: 1.5 liters daily  Skin: R back incision  Diet Order: Carb Control Medium (1600 - 2000)  EDUCATION NEEDS: -No education needs identified at this time   Intake/Output Summary (Last 24 hours) at 05/05/13 1403 Last data filed at 05/05/13 1400  Gross per 24 hour  Intake    700 ml  Output    260 ml  Net    440 ml    Last BM: 8/28  Labs:   Recent Labs Lab 05/02/13 0355 05/04/13 1610 05/05/13 0600  NA 132* 134* 134*  K 4.0 4.5 4.1  CL 93* 94* 93*  CO2 31 30 28   BUN 62* 57* 58*  CREATININE 1.80* 1.69*  1.71* 1.71*  CALCIUM 8.6 9.2 8.9  GLUCOSE 151* 164* 182*    CBG (last 3)   Recent Labs  05/02/13 1609 05/05/13 0733  GLUCAP 169* 192*    Scheduled Meds: . allopurinol  100 mg Oral Daily  . ALPRAZolam  1 mg Oral QHS  . aspirin EC  81 mg Oral QHS  . atorvastatin  40 mg Oral QHS  .  carvedilol  6.25 mg Oral BID WC  . furosemide  60 mg Intravenous BID  . heparin  5,000 Units Subcutaneous Q8H  . isosorbide mononitrate  30 mg Oral Daily  . levothyroxine  125 mcg Oral QAC breakfast  . pantoprazole  40 mg Oral Daily  . sevelamer carbonate  800 mg Oral BID WC  . sodium chloride  3 mL Intravenous Q12H  . sodium chloride  3 mL Intravenous Q12H  . Vitamin D (Ergocalciferol)  50,000 Units Oral Q M,W,F    Continuous Infusions:   Past Medical History  Diagnosis Date  . Myocardial infarction 1995  . CHF (congestive heart failure)   .  Pacemaker   . Hypertension   . High cholesterol   . Pleural effusion on right 04/27/2013    [name] 04/27/2013 (04/27/2013)  . Chronic kidney disease (CKD), stage IV (severe)     [name] 04/27/2013 (04/27/2013)  . Pneumonia 1948; 1954; 02/2013; ?04/27/2013    "real bad the first 2 times"; don't remember how bad in 02/2013; might have it now"  (04/27/2013)  . Exertional shortness of breath     "for the past week" (04/27/2013)  . Hypothyroidism   . Type II diabetes mellitus   . History of blood transfusion 2010    "w/hip replacement" (04/27/2013)  . Arthritis     "back; hands" (04/27/2013)  . Chronic lower back pain   . Gout     "several times; left ankle; fingers" (04/27/2013)    Past Surgical History  Procedure Laterality Date  . Coronary angioplasty  1995  . Coronary angioplasty with stent placement  2000    "?1" (04/27/2013)  . Abdominal wall mesh  removal  07/2007    "took the part out that was infected" (04/27/2013)  . Total hip arthroplasty Left 2009  . Total hip arthroplasty Right 2010  . Insert / replace / remove pacemaker  ~ 2011  . Hernia repair  06/2007    "umbilical hernia repair" (04/27/2013)  . Cataract extraction w/ intraocular lens implant Right 05/2012  . Joint replacement    . Thoracentesis Right 04/28/2013    Jarold Motto MS, RD, LDN Pager: [phone] After-hours pager: [phone]

## 2013-05-05 NOTE — Progress Notes (Signed)
Subjective:  Mild SOB, in bed. Daughter in room  Objective:  Vital Signs in the last 24 hours: Temp:  [97.4 F (36.3 C)-98.2 F (36.8 C)] 97.9 F (36.6 C) (08/29 0648) Pulse Rate:  [69-70] 70 (08/29 0836) Resp:  [16-18] 18 (08/29 0836) BP: (98-137)/(49-66) 137/66 mmHg (08/29 0648) SpO2:  [90 %-98 %] 98 % (08/29 0836) Weight:  [75.7 kg (166 lb 14.2 oz)-76.386 kg (168 lb 6.4 oz)] 76.386 kg (168 lb 6.4 oz) (08/29 0648)  Intake/Output from previous day: 08/28 0701 - 08/29 0700 In: 240 [P.O.:240] Out: 150 [Urine:150]   Physical Exam: General: Elderly, in no acute distress. Head:  Normocephalic and atraumatic. Lungs: Decreased BS bases, RLL.  Heart: Normal S1 and S2.  No murmur, rubs or gallops.  Abdomen: soft, non-tender, positive bowel sounds. Extremities: No clubbing or cyanosis. No edema. Neurologic: Alert and oriented x 3.    Lab Results:  Recent Labs  05/04/13 1610 05/05/13 0600  WBC 9.5 9.1  HGB 13.8 13.3  PLT 180 197    Recent Labs  05/04/13 1610 05/05/13 0600  NA 134* 134*  K 4.5 4.1  CL 94* 93*  CO2 30 28  GLUCOSE 164* 182*  BUN 57* 58*  CREATININE 1.69*  1.71* 1.71*    Recent Labs  05/04/13 1610  TROPONINI <0.30   Hepatic Function Panel  Recent Labs  05/04/13 1610  PROT 6.2  ALBUMIN 2.5*  AST 42*  ALT 15  ALKPHOS 131*  BILITOT 0.4   No results found for this basename: CHOL,  in the last 72 hours No results found for this basename: PROTIME,  in the last 72 hours  Imaging: Dg Chest 2 View  05/04/2013   *RADIOLOGY REPORT*  Clinical Data: Follow up pleural effusion.  CHEST - 2 VIEW  Comparison: 04/28/2013  Findings: Moderate to large right pleural effusion is now present, reaccumulated from the prior exam.  There is perihilar opacity on the right that is likely atelectasis.  There is a minimal left pleural effusion.  There is no overt pulmonary edema.  The cardiac silhouette is mildly enlarged.  There is no pneumothorax.  The left  anterior chest wall sequential pacemaker is stable well- positioned.  IMPRESSION: Moderate to large right pleural effusion, which has reaccumulated since the prior chest radiograph.  There is associated right perihilar lung base atelectasis.  No overt pulmonary edema. Minimal left pleural effusion.   Original Report Authenticated By: Amie Portland, M.D.   Personally viewed.   Telemetry: V paced Personally viewed.  Cardiac Studies:  EF 30%  Assessment/Plan:  Principal Problem:   Acute on chronic systolic CHF (congestive heart failure) Active Problems:   CKD (chronic kidney disease) stage 4, GFR 15-29 ml/min   Cardiomyopathy, ischemic   CAD (coronary artery disease)   Encephalopathy acute   Pleural effusion - right   Cardiac pacemaker in situ   1) Recurrent pleural effusion  - pulmonary consult  - ? Candidate for pleurodesis?  - Patsy, RN, daughter not happy with current plan of lasix only  - I explained to her that lasix if adequately dosed can help with effusions  - She is worried about worsening renal failure with increased lasix  - I encouraged fluid restriction with EF 30% as well but she said that Dr. Felipa Cruz PCP,  encourages more fluid because of kidney function and increased lasix.  - She did not get to talk with pulmonary and she is wishing to discuss plan.   - Dr. Vanessa Barbara  is contacting pulmonary to discuss treatment plan  2) Acute on chronic systolic HF  - I would incourage increased lasix (80IV BID or TID) to really push urine output. (Currently +500). The daughter does not agree. Once again concerned with renal failure. I tried to stress that we would closely monitor her BMET. She understood this but is convinced that if thoracentesis/ sclerotherapy takes place that this will be ultimate therapy.   Will follow.  Miranda Cruz 05/05/2013, 2:34 PM

## 2013-05-06 ENCOUNTER — Inpatient Hospital Stay (HOSPITAL_COMMUNITY): Payer: Medicare Other

## 2013-05-06 DIAGNOSIS — I509 Heart failure, unspecified: Secondary | ICD-10-CM

## 2013-05-06 DIAGNOSIS — I2589 Other forms of chronic ischemic heart disease: Secondary | ICD-10-CM

## 2013-05-06 DIAGNOSIS — I5023 Acute on chronic systolic (congestive) heart failure: Principal | ICD-10-CM

## 2013-05-06 LAB — PRO B NATRIURETIC PEPTIDE: Pro B Natriuretic peptide (BNP): 4186 pg/mL — ABNORMAL HIGH (ref 0–450)

## 2013-05-06 LAB — BASIC METABOLIC PANEL
CO2: 29 mEq/L (ref 19–32)
Chloride: 94 mEq/L — ABNORMAL LOW (ref 96–112)
Potassium: 4.7 mEq/L (ref 3.5–5.1)
Sodium: 134 mEq/L — ABNORMAL LOW (ref 135–145)

## 2013-05-06 LAB — CBC
HCT: 41.9 % (ref 36.0–46.0)
MCV: 91.1 fL (ref 78.0–100.0)
RBC: 4.6 MIL/uL (ref 3.87–5.11)
WBC: 8.8 10*3/uL (ref 4.0–10.5)

## 2013-05-06 LAB — LACTATE DEHYDROGENASE, PLEURAL OR PERITONEAL FLUID: LD, Fluid: 91 U/L — ABNORMAL HIGH (ref 3–23)

## 2013-05-06 LAB — PROTEIN, BODY FLUID

## 2013-05-06 LAB — BODY FLUID CELL COUNT WITH DIFFERENTIAL
Eos, Fluid: 18 %
Monocyte-Macrophage-Serous Fluid: 26 % — ABNORMAL LOW (ref 50–90)
Neutrophil Count, Fluid: 36 % — ABNORMAL HIGH (ref 0–25)

## 2013-05-06 LAB — CHOLESTEROL, BODY FLUID: Cholesterol, Fluid: 37 mg/dL

## 2013-05-06 MED ORDER — FUROSEMIDE 10 MG/ML IJ SOLN
80.0000 mg | Freq: Two times a day (BID) | INTRAMUSCULAR | Status: DC
  Administered 2013-05-06 – 2013-05-07 (×2): 80 mg via INTRAVENOUS
  Filled 2013-05-06 (×2): qty 8

## 2013-05-06 MED ORDER — LEVALBUTEROL TARTRATE 45 MCG/ACT IN AERO
6.0000 | INHALATION_SPRAY | RESPIRATORY_TRACT | Status: DC | PRN
  Filled 2013-05-06: qty 15

## 2013-05-06 NOTE — Procedures (Signed)
Thoracentesis Procedure Note  Pre-operative Diagnosis: recurrent right effusion   Post-operative Diagnosis: same  Indications: dyspnea   Procedure Details  Consent: Informed consent was obtained. Risks of the procedure were discussed including: infection, bleeding, pain, pneumothorax.  Under sterile conditions the patient was positioned. Betadine solution and sterile drapes were utilized.  1% buffered lidocaine was used to anesthetize the  rib space which was identified via [place]. Fluid was obtained without any difficulties and minimal blood loss.  A dressing was applied to the wound and wound care instructions were provided.   Findings 1750 ml of clear yellow pleural fluid was obtained. A sample was sent to Pathology for cytogenetics, flow, and cell counts, as well as for infection analysis.  Complications:  None; patient tolerated the procedure well.          Condition: stable  Plan A follow up chest x-ray was ordered. Bed Rest for 0 hours. Tylenol 650 mg. for pain.  Billy Fischer, MD ; [facility] [phone].  After 5:30 PM or weekends, call [phone]

## 2013-05-06 NOTE — Progress Notes (Signed)
Subjective:   Currently undergoing R thoracentesis. Breathing better.  Objective:  Vital Signs in the last 24 hours: Temp:  [97.5 F (36.4 C)-98.6 F (37 C)] 97.8 F (36.6 C) (08/30 0508) Pulse Rate:  [70-71] 70 (08/30 0508) Resp:  [18-20] 20 (08/30 0508) BP: (87-126)/(45-72) 119/50 mmHg (08/30 0840) SpO2:  [95 %-97 %] 95 % (08/30 0508) Weight:  [77.1 kg (169 lb 15.6 oz)] 77.1 kg (169 lb 15.6 oz) (08/30 0508)  Intake/Output from previous day: 08/29 0701 - 08/30 0700 In: 1180 [P.O.:1180] Out: 860 [Urine:860]   Physical Exam: General: Elderly, in no acute distress. JVP 10  Head:  Normocephalic and atraumatic. Lungs: Currently being tapped on R Heart: Normal S1 and S2.  No murmur, rubs or gallops.  Abdomen: soft, non-tender, positive bowel sounds. Extremities: No clubbing or cyanosis. No edema. Neurologic: Alert and oriented x 3.    Lab Results:  Recent Labs  05/05/13 0600 05/06/13 0432  WBC 9.1 8.8  HGB 13.3 13.4  PLT 197 170    Recent Labs  05/05/13 0600 05/06/13 0432  NA 134* 134*  K 4.1 4.7  CL 93* 94*  CO2 28 29  GLUCOSE 182* 154*  BUN 58* 58*  CREATININE 1.71* 1.68*    Recent Labs  05/04/13 1610  TROPONINI <0.30   Hepatic Function Panel  Recent Labs  05/04/13 1610  PROT 6.2  ALBUMIN 2.5*  AST 42*  ALT 15  ALKPHOS 131*  BILITOT 0.4   No results found for this basename: CHOL,  in the last 72 hours No results found for this basename: PROTIME,  in the last 72 hours  Imaging: Dg Chest 2 View  05/04/2013   *RADIOLOGY REPORT*  Clinical Data: Follow up pleural effusion.  CHEST - 2 VIEW  Comparison: 04/28/2013  Findings: Moderate to large right pleural effusion is now present, reaccumulated from the prior exam.  There is perihilar opacity on the right that is likely atelectasis.  There is a minimal left pleural effusion.  There is no overt pulmonary edema.  The cardiac silhouette is mildly enlarged.  There is no pneumothorax.  The left anterior  chest wall sequential pacemaker is stable well- positioned.  IMPRESSION: Moderate to large right pleural effusion, which has reaccumulated since the prior chest radiograph.  There is associated right perihilar lung base atelectasis.  No overt pulmonary edema. Minimal left pleural effusion.   Original Report Authenticated By: Amie Portland, M.D.   Personally viewed.   Telemetry: V paced Personally viewed.  Cardiac Studies:  EF 30% RV normal  Assessment/Plan:  Principal Problem:   Acute on chronic systolic CHF (congestive heart failure) Active Problems:   CKD (chronic kidney disease) stage 4, GFR 15-29 ml/min   Cardiomyopathy, ischemic   CAD (coronary artery disease)   Encephalopathy acute   Pleural effusion - right   Cardiac pacemaker in situ   1) Recurrent R pleural effusion 2) Acute on chronic systolic HF 3) Chronic renal failure stage III-IV 4) Hyponatremia 5) Protein-calorie malnutrition  Given rapidity with which fluid re-accumulated I agree that thoracentesis likely followed by Pleurex catheter is only way to manage effusion at this point. Volume status does appear to be a bit up so will increase lasix to 80 bid however not sure this will be sufficient to keep fluid off. May consider switch to demadex at d/c. With age and comorbidities talc pleurodesis probably too high risk.   Kynslee Baham,MD 12:08 PM

## 2013-05-06 NOTE — Progress Notes (Signed)
[facility] PROGRESS NOTE  Miranda Cruz NWG:[id number] DOB: 09-Feb-1930 DOA: 05/04/2013 PCP: Hoyle Sauer, MD  Assessment/Plan: Recurrent right-sided pleural effusion  Patient with a recent hospitalization, discharged on 05/02/2013 at which time she underwent ultrasound-guided thoracentesis, yielding 1.5 L of yellow fluid. Recurrent right-sided pleural effusion likely secondary to congestive heart failure. Today patient undergoing ultrasound-guided thoracentesis, with removal of 1.7 L of fluid. Tolerated procedure well there no immediate complications however post procedure chest x-ray is pending at the time of this now. Given frequent reaccumulation, it sounds like Pleurx catheter placement may be the next step in management,  as pleurocentesis would likely be ineffective  Acute hypoxemic respiratory failure Likely secondary to recurrent right-sided pleural effusion. Status post ultrasound-guided thoracentesis, patient reporting significant improvement.  Congestive heart failure, systolic Patient with history of ischemic cardiac myopathy, having an ejection fraction of 30-35% based on transthoracic echocardiogram on 04/29/2013. Cardiology following, her Lasix has been increased to 80 mg IV twice a day  Stage III chronic kidney disease Her creatinine remained stable at 1.6.  Type 2 diabetes mellitus . We'll continue status he'll coverage with Accu-Cheks q. a.c. and each bedtime     Code Status: Full code Plan has been discussed with patient    Consultants:  Pulmonary medicine  Cardiology  Procedures:  Ultrasound-guided thoracentesis performed on 05/06/2013   HPI/Subjective: Patient undergoing and bedside ultrasound-guided thoracentesis. Approximately 1700 mL of amber colored fluid were drained. She tolerated the procedure well, awaiting results from postprocedure chest x-ray. She reports breathing a lot easier after undergoing this procedure. Yesterday evening I was  called by RN and reported she had systolic blood pressures in the 80s. Because of hypotension instructed RN to hold dose of Lasix.  Objective: Filed Vitals:   05/06/13 0840  BP: 119/50  Pulse:   Temp:   Resp:     Intake/Output Summary (Last 24 hours) at 05/06/13 1221 Last data filed at 05/06/13 1000  Gross per 24 hour  Intake   1080 ml  Output    750 ml  Net    330 ml   Filed Weights   05/04/13 1508 05/05/13 0648 05/06/13 0508  Weight: 75.7 kg (166 lb 14.2 oz) 76.386 kg (168 lb 6.4 oz) 77.1 kg (169 lb 15.6 oz)    Exam:   General:  Patient in mild respiratory distress. She is awake alert oriented times  Cardiovascular: Regular rate and rhythm normal S1-S2  Respiratory: Lungs patient having diminished breath sounds to right side. Left lung is clear to auscultation bilaterally  Abdomen: Abdomen is soft nontender nontender positive bowel sound  Musculoskeletal: Present range of motion of all extremities  Data Reviewed: Basic Metabolic Panel:  Recent Labs Lab 05/01/13 0410 05/02/13 0355 05/04/13 1610 05/05/13 0600 05/06/13 0432  NA 129* 132* 134* 134* 134*  K 3.6 4.0 4.5 4.1 4.7  CL 91* 93* 94* 93* 94*  CO2 26 31 30 28 29   GLUCOSE 176* 151* 164* 182* 154*  BUN 57* 62* 57* 58* 58*  CREATININE 1.60* 1.80* 1.69*  1.71* 1.71* 1.68*  CALCIUM 8.9 8.6 9.2 8.9 8.6   Liver Function Tests:  Recent Labs Lab 05/04/13 1610  AST 42*  ALT 15  ALKPHOS 131*  BILITOT 0.4  PROT 6.2  ALBUMIN 2.5*   No results found for this basename: LIPASE, AMYLASE,  in the last 168 hours No results found for this basename: AMMONIA,  in the last 168 hours CBC:  Recent Labs Lab 05/04/13 1610 05/05/13 0600 05/06/13  0432  WBC 9.5 9.1 8.8  HGB 13.8 13.3 13.4  HCT 41.4 41.0 41.9  MCV 90.0 91.3 91.1  PLT 180 197 170   Cardiac Enzymes:  Recent Labs Lab 05/04/13 1610  TROPONINI <0.30   BNP (last 3 results)  Recent Labs  04/27/13 2019 05/06/13 0432  PROBNP 6703.0*  4186.0*   CBG:  Recent Labs Lab 05/02/13 0559 05/02/13 1110 05/02/13 1609 05/05/13 0733 05/06/13 0617  GLUCAP 143* 195* 169* 192* 164*    Recent Results (from the past 240 hour(s))  BODY FLUID CULTURE     Status: None   Collection Time    04/28/13  3:16 PM      Result Value Range Status   Specimen Description PLEURAL FLUID RIGHT   Final   Special Requests FLUID   Final   Gram Stain     Final   Value: RARE WBC PRESENT,BOTH PMN AND MONONUCLEAR     NO ORGANISMS SEEN     Performed at [facility]   Culture     Final   Value: NO GROWTH 3 DAYS     Performed at [facility]   Report Status 05/02/2013 FINAL   Final     Studies: Dg Chest 2 View  05/04/2013   *RADIOLOGY REPORT*  Clinical Data: Follow up pleural effusion.  CHEST - 2 VIEW  Comparison: 04/28/2013  Findings: Moderate to large right pleural effusion is now present, reaccumulated from the prior exam.  There is perihilar opacity on the right that is likely atelectasis.  There is a minimal left pleural effusion.  There is no overt pulmonary edema.  The cardiac silhouette is mildly enlarged.  There is no pneumothorax.  The left anterior chest wall sequential pacemaker is stable well- positioned.  IMPRESSION: Moderate to large right pleural effusion, which has reaccumulated since the prior chest radiograph.  There is associated right perihilar lung base atelectasis.  No overt pulmonary edema. Minimal left pleural effusion.   Original Report Authenticated By: Amie Portland, M.D.    Scheduled Meds: . allopurinol  100 mg Oral Daily  . ALPRAZolam  1 mg Oral QHS  . aspirin EC  81 mg Oral QHS  . atorvastatin  40 mg Oral QHS  . feeding supplement  237 mL Oral Q24H  . furosemide  80 mg Intravenous BID  . heparin  5,000 Units Subcutaneous Q8H  . isosorbide mononitrate  30 mg Oral Daily  . levothyroxine  125 mcg Oral QAC breakfast  . pantoprazole  40 mg Oral Daily  . sevelamer carbonate  800 mg Oral BID WC  .  sodium chloride  3 mL Intravenous Q12H  . sodium chloride  3 mL Intravenous Q12H  . Vitamin D (Ergocalciferol)  50,000 Units Oral Q M,W,F   Continuous Infusions:   Principal Problem:   Acute on chronic systolic CHF (congestive heart failure) Active Problems:   CKD (chronic kidney disease) stage 4, GFR 15-29 ml/min   Cardiomyopathy, ischemic   CAD (coronary artery disease)   Encephalopathy acute   Pleural effusion - right   Cardiac pacemaker in situ    Time spent: 40 min   Jeralyn Bennett  [facility] Pager [phone] 7PM-7AM, please contact night-coverage at [email or web address], password [facility] 05/06/2013, 12:21 PM  LOS: 2 days

## 2013-05-06 NOTE — Consult Note (Signed)
PULMONARY/CCM NOTE  Requesting MD/Service: Zamora/[facility] Date of admission: 8/28 Date of consult: 8/29 Reason for consultation: recurrent pl eff   HPI/Pt Profile:  79 yo F with cardiomyopathy, CKD and recent hospitalization for dyspnea that improved markedly after R thoracentesis that yielded 1.5 liters of transudative fluid. She was readmitted a mere 2 days after discharge with recurrent DOE and CXR revealing re-accumulation of her R effusion. She denies fever, chills, sweats, productive cough, hemoptysis. PCCM is asked to assist in further eval and consider repeat thoracentesis   SUBJ: Could not be effectively diuresed yesterday due to hypotension. Remains mildly dyspneic @ rest. No new complaints  EXAM:  Gen: elderly, chronically ill appearing, no overt resp distress HEENT: Poor dentition, otherwise WNL Neck: No JVD noted Lungs: dull to percussion with bronchial BS approx 1/2 up on R. No wheezes Cardiovascular: RRR, no M noted Abdomen: soft, NT, NABS Ext: warm, no edema Neuro: grossly intact  DATA:  BMET    Component Value Date/Time   NA 134* 05/06/2013 0432   K 4.7 05/06/2013 0432   CL 94* 05/06/2013 0432   CO2 29 05/06/2013 0432   GLUCOSE 154* 05/06/2013 0432   BUN 58* 05/06/2013 0432   CREATININE 1.68* 05/06/2013 0432   CALCIUM 8.6 05/06/2013 0432   GFRNONAA 28* 05/06/2013 0432   GFRAA 32* 05/06/2013 0432    CBC    Component Value Date/Time   WBC 8.8 05/06/2013 0432   RBC 4.60 05/06/2013 0432   HGB 13.4 05/06/2013 0432   HCT 41.9 05/06/2013 0432   PLT 170 05/06/2013 0432   MCV 91.1 05/06/2013 0432   MCH 29.1 05/06/2013 0432   MCHC 32.0 05/06/2013 0432   RDW 14.1 05/06/2013 0432   LYMPHSABS 0.8 02/05/2013 1235   MONOABS 0.4 02/05/2013 1235   EOSABS 0.0 02/05/2013 1235   BASOSABS 0.0 02/05/2013 1235    CXR: moderate R effusion, markedly increased since post thoracentesis film of 8/22 and approx same as pre-thora fil  ECHO 8/23: LVEF 30-35%  EKG: paced rhythm  IMPRESSION:    Recurrent large transudative R pleural effusion due to severe ischemic CM (systolic CHF)  Rapid reaccumulation after thoracentesis 8/22 Hypotension and CRI limiting ability to diurese  PLAN:  R thoracentesis today - done CT chest post thora to r/o trapped lung as cause of recurrent transudative effusion If pleural effusion recurs, will need to consider Pleurex catheter placement    Billy Fischer, MD ; [facility] service Mobile [phone].  After 5:30 PM or weekends, call [phone]

## 2013-05-07 LAB — CBC
Hemoglobin: 12.5 g/dL (ref 12.0–15.0)
MCHC: 32.5 g/dL (ref 30.0–36.0)
RDW: 14.1 % (ref 11.5–15.5)
WBC: 8.9 10*3/uL (ref 4.0–10.5)

## 2013-05-07 LAB — GLUCOSE, CAPILLARY: Glucose-Capillary: 179 mg/dL — ABNORMAL HIGH (ref 70–99)

## 2013-05-07 LAB — BASIC METABOLIC PANEL
BUN: 56 mg/dL — ABNORMAL HIGH (ref 6–23)
Chloride: 94 mEq/L — ABNORMAL LOW (ref 96–112)
GFR calc Af Amer: 35 mL/min — ABNORMAL LOW (ref >90)
GFR calc non Af Amer: 30 mL/min — ABNORMAL LOW (ref >90)
Potassium: 4.1 mEq/L (ref 3.5–5.1)

## 2013-05-07 LAB — PRO B NATRIURETIC PEPTIDE: Pro B Natriuretic peptide (BNP): 4272 pg/mL — ABNORMAL HIGH (ref 0–450)

## 2013-05-07 MED ORDER — TORSEMIDE 20 MG PO TABS
40.0000 mg | ORAL_TABLET | Freq: Two times a day (BID) | ORAL | Status: DC
  Administered 2013-05-07 – 2013-05-09 (×4): 40 mg via ORAL
  Filled 2013-05-07 (×6): qty 2

## 2013-05-07 MED ORDER — FLUCONAZOLE 150 MG PO TABS
150.0000 mg | ORAL_TABLET | Freq: Once | ORAL | Status: AC
  Administered 2013-05-07: 14:00:00 150 mg via ORAL
  Filled 2013-05-07: qty 1

## 2013-05-07 NOTE — Progress Notes (Signed)
Pharmacist Heart Failure Core Measure Documentation  Assessment: Miranda Cruz has an EF documented as 30-35% on Echo on 04/29/13  Rationale: Heart failure patients with left ventricular systolic dysfunction (LVSD) and an EF < 40% should be prescribed an angiotensin converting enzyme inhibitor (ACEI) or angiotensin receptor blocker (ARB) at discharge unless a contraindication is documented in the medical record.  This patient is not currently on an ACEI or ARB for HF.  This note is being placed in the record in order to provide documentation that a contraindication to the use of these agents is present for this encounter.  ACE Inhibitor or Angiotensin Receptor Blocker is contraindicated (specify all that apply)  []   ACEI allergy AND ARB allergy []   Angioedema []   Moderate or severe aortic stenosis []   Hyperkalemia []   Hypotension []   Renal artery stenosis [x]   Worsening renal function, preexisting renal disease or dysfunction   Taija Mathias S. Merilynn Finland, PharmD, BCPS Clinical Staff Pharmacist Pager [phone]  Misty Stanley Stillinger 05/07/2013 1:17 PM

## 2013-05-07 NOTE — Progress Notes (Addendum)
Subjective:   Underwent R thoracentesis yesterday. Lasix also increased. Breathing better. Walking halls without dyspnea.   Objective:  Vital Signs in the last 24 hours: Temp:  [97.5 F (36.4 C)-98.3 F (36.8 C)] 98.3 F (36.8 C) (08/31 0522) Pulse Rate:  [70-71] 70 (08/31 0522) Resp:  [18-20] 18 (08/31 0522) BP: (118-123)/(55-60) 123/60 mmHg (08/31 0522) SpO2:  [96 %-99 %] 98 % (08/31 0522) Weight:  [75.796 kg (167 lb 1.6 oz)] 75.796 kg (167 lb 1.6 oz) (08/31 0522)  Intake/Output from previous day: 08/30 0701 - 08/31 0700 In: 760 [P.O.:760] Out: 200 [Urine:200]   Physical Exam: General: Elderly, in no acute distress. JVP 6 Head:  Normocephalic and atraumatic. Lungs: Clear. Mildly dull on R base Heart: Normal S1 and S2.  No murmur, rubs or gallops.  Abdomen: soft, non-tender, positive bowel sounds. Extremities: No clubbing or cyanosis. No edema. Neurologic: Alert and oriented x 3.    Lab Results:  Recent Labs  05/06/13 0432 05/07/13 0430  WBC 8.8 8.9  HGB 13.4 12.5  PLT 170 166    Recent Labs  05/06/13 0432 05/07/13 0430  NA 134* 135  K 4.7 4.1  CL 94* 94*  CO2 29 36*  GLUCOSE 154* 259*  BUN 58* 56*  CREATININE 1.68* 1.55*    Recent Labs  05/04/13 1610  TROPONINI <0.30   Hepatic Function Panel  Recent Labs  05/04/13 1610  PROT 6.2  ALBUMIN 2.5*  AST 42*  ALT 15  ALKPHOS 131*  BILITOT 0.4   No results found for this basename: CHOL,  in the last 72 hours No results found for this basename: PROTIME,  in the last 72 hours  Imaging: Ct Chest Wo Contrast  05/06/2013   *RADIOLOGY REPORT*  Clinical Data: status post thoracentesis, evaluate for cause of recurrent pleural effusion  CT CHEST WITHOUT CONTRAST  Technique:  Multidetector CT imaging of the chest was performed following the standard protocol without IV contrast.  Comparison: multiple recent chest radiographs  Findings: The study is limited without contrast.  There are small calcified  left hilar lymph nodes.  There is coronary arterial and aortic arch calcification.  Cardiac pacer leads are identified with generator over the left thorax.  The heart is enlarged.  There is a small to moderate right pleural effusion.  There is a small left pleural effusion.  There is no pneumothorax.  There is some dense material in consolidated posterior right lower lobe.  This could represent aspirated material.  Bilaterally there is dependent atelectasis.  On the right, there is a focus of irregular hazy airspace disease in the upper lobe on image number 20, measuring 21 x 13 mm.  There is more diffuse ground-glass attenuation throughout the inferior right middle lobe and more severely in the right lower lobe.  On the left, and series 3 image number 26, there is a 1.4 cm nodular density posteriorly in the left lower lobe, but viewing this on axial, coronal, and sagittal images suggests that it is related to subsegmental atelectasis rather than a lung mass.  There is a calcified granuloma measuring 5 mm in the lingula.  There is a very mild ground-glass attenuation in the left upper lobe.  Scans through the uppermost abdomen and unremarkable.  IMPRESSION: Study is limited without contrast, but there is no specific abnormality to account for recurrent pleural effusion.  Effusions are bilateral, but larger on the right.  Dense material in consolidated right lower lobe suggests possibility of aspiration.  There  is bilateral ground glass attenuation, but it is minimal on the left and much more prominent on the right.  This is nonspecific fidning, but given the asymmetry, it likely represents re-expansion pulmonary edema following thoracentesis.  Finally, there is an approximately 2cm focus of irregular hazy opacity in the right upper lobe, entirely nonspecific. Abnormalities such as atelectasis, focal edema/hemorrhage, or even adenocarcinoma can appear like this, and this should receive followup imaging to reassess.    Original Report Authenticated By: Esperanza Heir, M.D.   Dg Chest Port 1 View  05/06/2013   *RADIOLOGY REPORT*  Clinical Data: Status post right thoracentesis.  PORTABLE CHEST - 1 VIEW  Comparison: 05/04/2013  Findings: There is been significant reduction in the right-sided pleural effusion.  No pneumothorax is identified.  A lucency is noted over the right apex which is related to a skin fold.    A pacing device is again seen.  No other focal abnormality is noted.  IMPRESSION: No pneumothorax following thoracentesis.  Significant reduction in right effusion is noted.   Original Report Authenticated By: Alcide Clever, M.D.   Personally viewed.   Telemetry: V paced Personally viewed.  Cardiac Studies:  EF 30% RV normal  Assessment/Plan:  Principal Problem:   Acute on chronic systolic CHF (congestive heart failure) Active Problems:   CKD (chronic kidney disease) stage 4, GFR 15-29 ml/min   Cardiomyopathy, ischemic   CAD (coronary artery disease)   Encephalopathy acute   Pleural effusion - right   Cardiac pacemaker in situ   1) Recurrent R pleural effusion 2) Acute on chronic systolic HF 3) Chronic renal failure stage III-IV 4) Hyponatremia 5) Protein-calorie malnutrition  She looks very good today. Volume status at baseline. Renal function improving. I will switch her to po demadex 40 bid (can switch to 60 bid as needed). Goal will be to keep her as dry as possible and see if pleural effusion returns. If returns quickly will need Pleurex catheter for chronic drainage. With age and comorbidities talc pleurodesis probably too high risk. I will order 2 view CXR for am. Discussed with patient and her daughter.   Daniel Bensimhon,MD 10:38 AM

## 2013-05-07 NOTE — Progress Notes (Signed)
[facility] PROGRESS NOTE  Miranda Cruz WJX:[id number] DOB: 1929-10-20 DOA: 05/04/2013 PCP: Hoyle Sauer, MD  Assessment/Plan: Recurrent right-sided pleural effusion  Patient undergoing bedside ultrasound-guided thoracentesis yesterday with removal of 1.7 L of pleural fluid. She tolerated the procedure well there were no immediate complications. Today she is feeling much better, ambulated down the hallway with RN. She has been switched to by mouth Demadex 40 mg twice a day as the goal would be to keep her dry possible and repeat imaging studies to see if pleural effusion returns. If there is a rapid reaccumulation, Rx catheter placement will need to be considered.  Acute hypoxemic respiratory failure Likely secondary to recurrent right-sided pleural effusion. Status post ultrasound-guided thoracentesis yesterday with removal of 1.7 L of fluid, patient reporting significant improvement.  Congestive heart failure, systolic Patient with history of ischemic cardiac myopathy, having an ejection fraction of 30-35% based on transthoracic echocardiogram on 04/29/2013. Cardiology switching her to Demadex 40 mg by mouth twice a day. We'll follow ins and outs, check daily kidney function  Stage III chronic kidney disease Kidney function is improving today as her creatinine trended down to 1.6 at yesterday 1.55 on this mornings lab work..  Type 2 diabetes mellitus . We'll continue status he'll coverage with Accu-Cheks q. a.c. and each bedtime     Code Status: Full code Plan has been discussed with patient    Consultants:  Pulmonary medicine  Cardiology  Procedures:  Ultrasound-guided thoracentesis performed on 05/06/2013   HPI/Subjective: Patient undergoing and bedside ultrasound-guided thoracentesis. Approximately 1700 mL of amber colored fluid were drained. She tolerated the procedure well, awaiting results from postprocedure chest x-ray. She reports breathing a lot easier after  undergoing this procedure. Yesterday evening I was called by RN and reported she had systolic blood pressures in the 80s. Because of hypotension instructed RN to hold dose of Lasix.  Objective: Filed Vitals:   05/07/13 0522  BP: 123/60  Pulse: 70  Temp: 98.3 F (36.8 C)  Resp: 18    Intake/Output Summary (Last 24 hours) at 05/07/13 1139 Last data filed at 05/06/13 2045  Gross per 24 hour  Intake    520 ml  Output    200 ml  Net    320 ml   Filed Weights   05/05/13 0648 05/06/13 0508 05/07/13 0522  Weight: 76.386 kg (168 lb 6.4 oz) 77.1 kg (169 lb 15.6 oz) 75.796 kg (167 lb 1.6 oz)    Exam:   General:  Patient in mild respiratory distress. She is awake alert oriented times  Cardiovascular: Regular rate and rhythm normal S1-S2  Respiratory: Lungs patient having diminished breath sounds to right side. Left lung is clear to auscultation bilaterally  Abdomen: Abdomen is soft nontender nontender positive bowel sound  Musculoskeletal: Present range of motion of all extremities  Data Reviewed: Basic Metabolic Panel:  Recent Labs Lab 05/02/13 0355 05/04/13 1610 05/05/13 0600 05/06/13 0432 05/07/13 0430  NA 132* 134* 134* 134* 135  K 4.0 4.5 4.1 4.7 4.1  CL 93* 94* 93* 94* 94*  CO2 31 30 28 29  36*  GLUCOSE 151* 164* 182* 154* 259*  BUN 62* 57* 58* 58* 56*  CREATININE 1.80* 1.69*  1.71* 1.71* 1.68* 1.55*  CALCIUM 8.6 9.2 8.9 8.6 8.5   Liver Function Tests:  Recent Labs Lab 05/04/13 1610  AST 42*  ALT 15  ALKPHOS 131*  BILITOT 0.4  PROT 6.2  ALBUMIN 2.5*   No results found for this basename: LIPASE,  AMYLASE,  in the last 168 hours No results found for this basename: AMMONIA,  in the last 168 hours CBC:  Recent Labs Lab 05/04/13 1610 05/05/13 0600 05/06/13 0432 05/07/13 0430  WBC 9.5 9.1 8.8 8.9  HGB 13.8 13.3 13.4 12.5  HCT 41.4 41.0 41.9 38.5  MCV 90.0 91.3 91.1 91.2  PLT 180 197 170 166   Cardiac Enzymes:  Recent Labs Lab 05/04/13 1610   TROPONINI <0.30   BNP (last 3 results)  Recent Labs  04/27/13 2019 05/06/13 0432 05/07/13 0430  PROBNP 6703.0* 4186.0* 4272.0*   CBG:  Recent Labs Lab 05/02/13 1110 05/02/13 1609 05/05/13 0733 05/06/13 0617 05/07/13 0616  GLUCAP 195* 169* 192* 164* 179*    Recent Results (from the past 240 hour(s))  BODY FLUID CULTURE     Status: None   Collection Time    04/28/13  3:16 PM      Result Value Range Status   Specimen Description PLEURAL FLUID RIGHT   Final   Special Requests FLUID   Final   Gram Stain     Final   Value: RARE WBC PRESENT,BOTH PMN AND MONONUCLEAR     NO ORGANISMS SEEN     Performed at [facility]   Culture     Final   Value: NO GROWTH 3 DAYS     Performed at [facility]   Report Status 05/02/2013 FINAL   Final  BODY FLUID CULTURE     Status: None   Collection Time    05/06/13 12:27 PM      Result Value Range Status   Specimen Description FLUID RIGHT PLEURAL   Final   Special Requests NONE   Final   Gram Stain     Final   Value: CYTOSPIN WBC PRESENT,BOTH PMN AND MONONUCLEAR     NO ORGANISMS SEEN     Performed at [facility]   Culture PENDING   Incomplete   Report Status PENDING   Incomplete     Studies: Ct Chest Wo Contrast  05/06/2013   *RADIOLOGY REPORT*  Clinical Data: status post thoracentesis, evaluate for cause of recurrent pleural effusion  CT CHEST WITHOUT CONTRAST  Technique:  Multidetector CT imaging of the chest was performed following the standard protocol without IV contrast.  Comparison: multiple recent chest radiographs  Findings: The study is limited without contrast.  There are small calcified left hilar lymph nodes.  There is coronary arterial and aortic arch calcification.  Cardiac pacer leads are identified with generator over the left thorax.  The heart is enlarged.  There is a small to moderate right pleural effusion.  There is a small left pleural effusion.  There is no pneumothorax.  There is  some dense material in consolidated posterior right lower lobe.  This could represent aspirated material.  Bilaterally there is dependent atelectasis.  On the right, there is a focus of irregular hazy airspace disease in the upper lobe on image number 20, measuring 21 x 13 mm.  There is more diffuse ground-glass attenuation throughout the inferior right middle lobe and more severely in the right lower lobe.  On the left, and series 3 image number 26, there is a 1.4 cm nodular density posteriorly in the left lower lobe, but viewing this on axial, coronal, and sagittal images suggests that it is related to subsegmental atelectasis rather than a lung mass.  There is a calcified granuloma measuring 5 mm in the lingula.  There is a  very mild ground-glass attenuation in the left upper lobe.  Scans through the uppermost abdomen and unremarkable.  IMPRESSION: Study is limited without contrast, but there is no specific abnormality to account for recurrent pleural effusion.  Effusions are bilateral, but larger on the right.  Dense material in consolidated right lower lobe suggests possibility of aspiration.  There is bilateral ground glass attenuation, but it is minimal on the left and much more prominent on the right.  This is nonspecific fidning, but given the asymmetry, it likely represents re-expansion pulmonary edema following thoracentesis.  Finally, there is an approximately 2cm focus of irregular hazy opacity in the right upper lobe, entirely nonspecific. Abnormalities such as atelectasis, focal edema/hemorrhage, or even adenocarcinoma can appear like this, and this should receive followup imaging to reassess.   Original Report Authenticated By: Esperanza Heir, M.D.   Dg Chest Port 1 View  05/06/2013   *RADIOLOGY REPORT*  Clinical Data: Status post right thoracentesis.  PORTABLE CHEST - 1 VIEW  Comparison: 05/04/2013  Findings: There is been significant reduction in the right-sided pleural effusion.  No  pneumothorax is identified.  A lucency is noted over the right apex which is related to a skin fold.    A pacing device is again seen.  No other focal abnormality is noted.  IMPRESSION: No pneumothorax following thoracentesis.  Significant reduction in right effusion is noted.   Original Report Authenticated By: Alcide Clever, M.D.    Scheduled Meds: . allopurinol  100 mg Oral Daily  . ALPRAZolam  1 mg Oral QHS  . aspirin EC  81 mg Oral QHS  . atorvastatin  40 mg Oral QHS  . feeding supplement  237 mL Oral Q24H  . heparin  5,000 Units Subcutaneous Q8H  . isosorbide mononitrate  30 mg Oral Daily  . levothyroxine  125 mcg Oral QAC breakfast  . pantoprazole  40 mg Oral Daily  . sevelamer carbonate  800 mg Oral BID WC  . sodium chloride  3 mL Intravenous Q12H  . sodium chloride  3 mL Intravenous Q12H  . torsemide  40 mg Oral BID  . Vitamin D (Ergocalciferol)  50,000 Units Oral Q M,W,F   Continuous Infusions:   Principal Problem:   Acute on chronic systolic CHF (congestive heart failure) Active Problems:   CKD (chronic kidney disease) stage 4, GFR 15-29 ml/min   Cardiomyopathy, ischemic   CAD (coronary artery disease)   Encephalopathy acute   Pleural effusion - right   Cardiac pacemaker in situ    Time spent: 40 min   Jeralyn Bennett  [facility] Pager [phone] 7PM-7AM, please contact night-coverage at [email or web address], password [facility] 05/07/2013, 11:39 AM  LOS: 3 days

## 2013-05-07 NOTE — Progress Notes (Signed)
Seen for brief post-thoracentesis check after tap yesterday. C/W transudative fluid from CHF, with f/u imaging advised. Please call if PCCM needed this weekend.

## 2013-05-08 ENCOUNTER — Inpatient Hospital Stay (HOSPITAL_COMMUNITY): Payer: Medicare Other

## 2013-05-08 LAB — CBC
MCHC: 31.8 g/dL (ref 30.0–36.0)
RDW: 14.4 % (ref 11.5–15.5)
WBC: 10 10*3/uL (ref 4.0–10.5)

## 2013-05-08 LAB — BASIC METABOLIC PANEL
BUN: 58 mg/dL — ABNORMAL HIGH (ref 6–23)
Creatinine, Ser: 1.63 mg/dL — ABNORMAL HIGH (ref 0.50–1.10)
GFR calc Af Amer: 33 mL/min — ABNORMAL LOW (ref >90)
GFR calc non Af Amer: 29 mL/min — ABNORMAL LOW (ref >90)
Potassium: 4.2 mEq/L (ref 3.5–5.1)

## 2013-05-08 LAB — GLUCOSE, CAPILLARY: Glucose-Capillary: 251 mg/dL — ABNORMAL HIGH (ref 70–99)

## 2013-05-08 LAB — MAGNESIUM: Magnesium: 1.8 mg/dL (ref 1.5–2.5)

## 2013-05-08 NOTE — Progress Notes (Signed)
[facility] PROGRESS NOTE  Miranda Cruz ZOX:[id number] DOB: May 04, 1930 DOA: 05/04/2013 PCP: Hoyle Sauer, MD  Assessment/Plan: Recurrent right-sided pleural effusion  Patient undergoing bedside ultrasound-guided thoracentesis on this admission with removal of 1.7 L of pleural fluid. She tolerated the procedure well there were no immediate complications. She has been switched to by mouth Demadex 40 mg twice a day as the goal would be to keep her dry possible and repeat imaging studies to see if pleural effusion returns. Yesterday she did well ambulated down the hallway and back. Chest x-ray PA and lateral pending at the time of this note. If there is evidence of rapidly chelation, we'll consider Pleurx catheter placement  Acute hypoxemic respiratory failure Likely secondary to recurrent right-sided pleural effusion. Status post ultrasound-guided thoracentesis on 05/06/2013 with removal of 1.7 L of fluid, patient reporting significant improvement.  Congestive heart failure, systolic Patient with history of ischemic cardiac myopathy, having an ejection fraction of 30-35% based on transthoracic echocardiogram on 04/29/2013. Cardiology switching her to Demadex 40 mg by mouth twice a day yesterday. We'll follow ins and outs. Kidney function stable  Stage III chronic kidney disease Kidney function stable having today creatinine 1.63. Marland Kitchen  Type 2 diabetes mellitus . We'll continue status he'll coverage with Accu-Cheks q. a.c. and each bedtime     Code Status: Full code Plan has been discussed with patient    Consultants:  Pulmonary medicine  Cardiology  Procedures:  Ultrasound-guided thoracentesis performed on 05/06/2013   HPI/Subjective: Patient seemed to be doing well yesterday, ambulated down the hallway and back. She reported significant improvement to her shortness of breath after undergoing thoracentesis. Today she reports feeling a little more his neck at rest. She feels  that her pleural effusion is coming back. She does not feel as energetic as he did yesterday afternoon.  Objective: Filed Vitals:   05/08/13 0951  BP: 119/58  Pulse: 70  Temp: 98.2 F (36.8 C)  Resp: 18    Intake/Output Summary (Last 24 hours) at 05/08/13 1035 Last data filed at 05/08/13 0841  Gross per 24 hour  Intake    880 ml  Output   1101 ml  Net   -221 ml   Filed Weights   05/06/13 0508 05/07/13 0522 05/08/13 0510  Weight: 77.1 kg (169 lb 15.6 oz) 75.796 kg (167 lb 1.6 oz) 75.8 kg (167 lb 1.7 oz)    Exam:   General:  Patient in mild respiratory distress. She is awake alert oriented times  Cardiovascular: Regular rate and rhythm normal S1-S2  Respiratory: On lung exam this morning, I think there may be some diminished breath sounds to her right base.  Abdomen: Abdomen is soft nontender nontender positive bowel sound  Musculoskeletal: Present range of motion of all extremities  Data Reviewed: Basic Metabolic Panel:  Recent Labs Lab 05/04/13 1610 05/05/13 0600 05/06/13 0432 05/07/13 0430 05/08/13 0400  NA 134* 134* 134* 135 136  K 4.5 4.1 4.7 4.1 4.2  CL 94* 93* 94* 94* 94*  CO2 30 28 29  36* 38*  GLUCOSE 164* 182* 154* 259* 219*  BUN 57* 58* 58* 56* 58*  CREATININE 1.69*  1.71* 1.71* 1.68* 1.55* 1.63*  CALCIUM 9.2 8.9 8.6 8.5 8.5  MG  --   --   --   --  1.8   Liver Function Tests:  Recent Labs Lab 05/04/13 1610  AST 42*  ALT 15  ALKPHOS 131*  BILITOT 0.4  PROT 6.2  ALBUMIN 2.5*  No results found for this basename: LIPASE, AMYLASE,  in the last 168 hours No results found for this basename: AMMONIA,  in the last 168 hours CBC:  Recent Labs Lab 05/04/13 1610 05/05/13 0600 05/06/13 0432 05/07/13 0430 05/08/13 0400  WBC 9.5 9.1 8.8 8.9 10.0  HGB 13.8 13.3 13.4 12.5 12.5  HCT 41.4 41.0 41.9 38.5 39.3  MCV 90.0 91.3 91.1 91.2 92.0  PLT 180 197 170 166 176   Cardiac Enzymes:  Recent Labs Lab 05/04/13 1610  TROPONINI <0.30   BNP  (last 3 results)  Recent Labs  04/27/13 2019 05/06/13 0432 05/07/13 0430  PROBNP 6703.0* 4186.0* 4272.0*   CBG:  Recent Labs Lab 05/02/13 1609 05/05/13 0733 05/06/13 0617 05/07/13 0616 05/08/13 0606  GLUCAP 169* 192* 164* 179* 251*    Recent Results (from the past 240 hour(s))  BODY FLUID CULTURE     Status: None   Collection Time    04/28/13  3:16 PM      Result Value Range Status   Specimen Description PLEURAL FLUID RIGHT   Final   Special Requests FLUID   Final   Gram Stain     Final   Value: RARE WBC PRESENT,BOTH PMN AND MONONUCLEAR     NO ORGANISMS SEEN     Performed at [facility]   Culture     Final   Value: NO GROWTH 3 DAYS     Performed at [facility]   Report Status 05/02/2013 FINAL   Final  BODY FLUID CULTURE     Status: None   Collection Time    05/06/13 12:27 PM      Result Value Range Status   Specimen Description FLUID RIGHT PLEURAL   Final   Special Requests NONE   Final   Gram Stain     Final   Value: CYTOSPIN WBC PRESENT,BOTH PMN AND MONONUCLEAR     NO ORGANISMS SEEN     Performed at [facility]   Culture     Final   Value: NO GROWTH 1 DAY     Performed at [facility]   Report Status PENDING   Incomplete     Studies: Dg Chest 2 View  05/08/2013   *RADIOLOGY REPORT*  Clinical Data: Evaluate right-sided pleural effusion after thoracentesis.  CHEST - 2 VIEW  Comparison: Chest x-ray 05/06/2013.  Findings: There is an unusual lucency both medially and laterally in the inferior right hemithorax on the frontal projection, which is suspicious for potential small pneumothorax, however, there is a right pleural effusion which is small, and this effusion does not appear to layer horizontally as would typically be seen in the setting of hydropneumothorax.  Small left pleural effusion is also noted.  Lung volumes are low, with bibasilar opacities which may reflect areas of atelectasis and/or consolidation.   Cephalization of the pulmonary vasculature, without frank pulmonary edema.  Heart size appears mildly enlarged.  Mediastinal contours are unremarkable.  Atherosclerosis of the thoracic aorta.  Left-sided pacemaker device with lead tips projecting over the expected location of the right atrium and right ventricular apex.  IMPRESSION: 1.  Unusual appearance of the right hemithorax, favored to reflect a some skin fold artifact.  Alternatively, a small right hydropneumothorax post thoracentesis is difficult to entirely exclude.  Attention on a follow-up study is suggested, preferably a standing PA and lateral chest radiograph to exclude pneumothorax. 2.  Small left pleural effusion. 3.  Cardiomegaly with pulmonary venous congestion,  but no frank pulmonary edema. 4.  Bibasilar atelectasis and/or consolidation. 5.  Atherosclerosis.  These results were called by telephone on 05/08/2013 at 09:10 a.m. to nurse Lupita Leash (for Dr. Gala Romney), who verbally acknowledged these results.   Original Report Authenticated By: Trudie Reed, M.D.   Ct Chest Wo Contrast  05/06/2013   *RADIOLOGY REPORT*  Clinical Data: status post thoracentesis, evaluate for cause of recurrent pleural effusion  CT CHEST WITHOUT CONTRAST  Technique:  Multidetector CT imaging of the chest was performed following the standard protocol without IV contrast.  Comparison: multiple recent chest radiographs  Findings: The study is limited without contrast.  There are small calcified left hilar lymph nodes.  There is coronary arterial and aortic arch calcification.  Cardiac pacer leads are identified with generator over the left thorax.  The heart is enlarged.  There is a small to moderate right pleural effusion.  There is a small left pleural effusion.  There is no pneumothorax.  There is some dense material in consolidated posterior right lower lobe.  This could represent aspirated material.  Bilaterally there is dependent atelectasis.  On the right, there is a  focus of irregular hazy airspace disease in the upper lobe on image number 20, measuring 21 x 13 mm.  There is more diffuse ground-glass attenuation throughout the inferior right middle lobe and more severely in the right lower lobe.  On the left, and series 3 image number 26, there is a 1.4 cm nodular density posteriorly in the left lower lobe, but viewing this on axial, coronal, and sagittal images suggests that it is related to subsegmental atelectasis rather than a lung mass.  There is a calcified granuloma measuring 5 mm in the lingula.  There is a very mild ground-glass attenuation in the left upper lobe.  Scans through the uppermost abdomen and unremarkable.  IMPRESSION: Study is limited without contrast, but there is no specific abnormality to account for recurrent pleural effusion.  Effusions are bilateral, but larger on the right.  Dense material in consolidated right lower lobe suggests possibility of aspiration.  There is bilateral ground glass attenuation, but it is minimal on the left and much more prominent on the right.  This is nonspecific fidning, but given the asymmetry, it likely represents re-expansion pulmonary edema following thoracentesis.  Finally, there is an approximately 2cm focus of irregular hazy opacity in the right upper lobe, entirely nonspecific. Abnormalities such as atelectasis, focal edema/hemorrhage, or even adenocarcinoma can appear like this, and this should receive followup imaging to reassess.   Original Report Authenticated By: Esperanza Heir, M.D.   Dg Chest Port 1 View  05/06/2013   *RADIOLOGY REPORT*  Clinical Data: Status post right thoracentesis.  PORTABLE CHEST - 1 VIEW  Comparison: 05/04/2013  Findings: There is been significant reduction in the right-sided pleural effusion.  No pneumothorax is identified.  A lucency is noted over the right apex which is related to a skin fold.    A pacing device is again seen.  No other focal abnormality is noted.  IMPRESSION: No  pneumothorax following thoracentesis.  Significant reduction in right effusion is noted.   Original Report Authenticated By: Alcide Clever, M.D.    Scheduled Meds: . allopurinol  100 mg Oral Daily  . ALPRAZolam  1 mg Oral QHS  . aspirin EC  81 mg Oral QHS  . atorvastatin  40 mg Oral QHS  . feeding supplement  237 mL Oral Q24H  . heparin  5,000 Units Subcutaneous Q8H  .  isosorbide mononitrate  30 mg Oral Daily  . levothyroxine  125 mcg Oral QAC breakfast  . pantoprazole  40 mg Oral Daily  . sevelamer carbonate  800 mg Oral BID WC  . sodium chloride  3 mL Intravenous Q12H  . sodium chloride  3 mL Intravenous Q12H  . torsemide  40 mg Oral BID  . Vitamin D (Ergocalciferol)  50,000 Units Oral Q M,W,F   Continuous Infusions:   Principal Problem:   Acute on chronic systolic CHF (congestive heart failure) Active Problems:   CKD (chronic kidney disease) stage 4, GFR 15-29 ml/min   Cardiomyopathy, ischemic   CAD (coronary artery disease)   Encephalopathy acute   Pleural effusion - right   Cardiac pacemaker in situ    Time spent: 40 min   Jeralyn Bennett  [facility] Pager [phone] 7PM-7AM, please contact night-coverage at [email or web address], password [facility] 05/08/2013, 10:35 AM  LOS: 4 days

## 2013-05-08 NOTE — Progress Notes (Signed)
Patient Name: Miranda Cruz Date of Encounter: 05/08/2013    SUBJECTIVE: Increasing shortness of breath today compared with yesterday. Some cough. Ambulation not his easy without dyspnea appear  TELEMETRY:  Normal sinus rhythm with left bundle branch block Filed Vitals:   05/07/13 2054 05/08/13 0510 05/08/13 0951 05/08/13 1408  BP: 123/57 116/54 119/58 117/50  Pulse: 70 73 70 70  Temp: 98.6 F (37 C) 98.7 F (37.1 C) 98.2 F (36.8 C) 97.9 F (36.6 C)  TempSrc: Oral Oral Oral Oral  Resp: 20 20 18 18   Height:      Weight:  75.8 kg (167 lb 1.7 oz)    SpO2: 100% 99% 99% 99%    Intake/Output Summary (Last 24 hours) at 05/08/13 1511 Last data filed at 05/08/13 1300  Gross per 24 hour  Intake    980 ml  Output    851 ml  Net    129 ml    LABS: Basic Metabolic Panel:  Recent Labs  16/10/96 0430 05/08/13 0400  NA 135 136  K 4.1 4.2  CL 94* 94*  CO2 36* 38*  GLUCOSE 259* 219*  BUN 56* 58*  CREATININE 1.55* 1.63*  CALCIUM 8.5 8.5  MG  --  1.8   CBC:  Recent Labs  05/07/13 0430 05/08/13 0400  WBC 8.9 10.0  HGB 12.5 12.5  HCT 38.5 39.3  MCV 91.2 92.0  PLT 166 176     Radiology/Studies:  CHEST - 2 VIEW  Comparison: 05/08/2013, 0851 hours.  Findings: Dual lead left subclavian pacemaker. Cardiomegaly. Low  lung volumes. Small to moderate bilateral pleural effusions and  basilar atelectasis. There is pulmonary vascular congestion,  interstitial and mild alveolar pulmonary edema. No pneumothorax.  Compared to the exam earlier today, aeration is slightly improved,  likely representing interval diuresis.  IMPRESSION:  Moderate CHF.    Physical Exam: Blood pressure 117/50, pulse 70, temperature 97.9 F (36.6 C), temperature source Oral, resp. rate 18, height 5\' 5"  (1.651 m), weight 75.8 kg (167 lb 1.7 oz), SpO2 99.00%. Weight change: 0.004 kg (0.1 oz)   Decreased breath sounds at both bases  No murmur or rub.  No peripheral edema.  ASSESSMENT:  1. Acute on  chronic systolic heart failure, improved since admission  2. Bilateral pleural effusions on today's chest x-ray.  3. Chronic renal insufficiency, stage III-IV, stable    Plan:  1. Continue diuresis as tolerated by kidneys and blood pressure to  Signed, Lesleigh Noe 05/08/2013, 3:11 PM

## 2013-05-09 LAB — PATHOLOGIST SMEAR REVIEW

## 2013-05-09 LAB — BASIC METABOLIC PANEL
Calcium: 8.8 mg/dL (ref 8.4–10.5)
GFR calc Af Amer: 37 mL/min — ABNORMAL LOW (ref >90)
GFR calc non Af Amer: 32 mL/min — ABNORMAL LOW (ref >90)
Sodium: 137 mEq/L (ref 135–145)

## 2013-05-09 LAB — GLUCOSE, CAPILLARY: Glucose-Capillary: 194 mg/dL — ABNORMAL HIGH (ref 70–99)

## 2013-05-09 LAB — BODY FLUID CULTURE: Culture: NO GROWTH

## 2013-05-09 MED ORDER — TORSEMIDE 20 MG PO TABS
80.0000 mg | ORAL_TABLET | Freq: Two times a day (BID) | ORAL | Status: DC
  Administered 2013-05-09 – 2013-05-10 (×2): 80 mg via ORAL
  Filled 2013-05-09 (×4): qty 4

## 2013-05-09 MED ORDER — INSULIN ASPART 100 UNIT/ML ~~LOC~~ SOLN
0.0000 [IU] | Freq: Every day | SUBCUTANEOUS | Status: DC
  Administered 2013-05-09: 22:00:00 2 [IU] via SUBCUTANEOUS
  Administered 2013-05-10 – 2013-05-11 (×2): 3 [IU] via SUBCUTANEOUS
  Administered 2013-05-12: 22:00:00 4 [IU] via SUBCUTANEOUS
  Administered 2013-05-13: 2 [IU] via SUBCUTANEOUS

## 2013-05-09 MED ORDER — INSULIN ASPART 100 UNIT/ML ~~LOC~~ SOLN
0.0000 [IU] | Freq: Three times a day (TID) | SUBCUTANEOUS | Status: DC
  Administered 2013-05-09: 5 [IU] via SUBCUTANEOUS
  Administered 2013-05-10: 11:00:00 8 [IU] via SUBCUTANEOUS
  Administered 2013-05-10: 3 [IU] via SUBCUTANEOUS
  Administered 2013-05-11: 17:00:00 8 [IU] via SUBCUTANEOUS
  Administered 2013-05-11 (×2): 3 [IU] via SUBCUTANEOUS
  Administered 2013-05-12: 11 [IU] via SUBCUTANEOUS
  Administered 2013-05-12: 5 [IU] via SUBCUTANEOUS
  Administered 2013-05-13: 3 [IU] via SUBCUTANEOUS
  Administered 2013-05-13: 07:00:00 2 [IU] via SUBCUTANEOUS
  Administered 2013-05-13: 13:00:00 5 [IU] via SUBCUTANEOUS
  Administered 2013-05-14: 12:00:00 via SUBCUTANEOUS
  Administered 2013-05-14: 18:00:00 3 [IU] via SUBCUTANEOUS
  Administered 2013-05-14: 5 [IU] via SUBCUTANEOUS
  Administered 2013-05-15 (×3): 3 [IU] via SUBCUTANEOUS

## 2013-05-09 NOTE — Progress Notes (Signed)
PULMONARY/CCM NOTE  Requesting MD/Service: Zamora/TRH Date of admission: 8/28 Date of consult: 8/29 Reason for consultation: recurrent pl eff   HPI/Pt Profile:  79 yo F with cardiomyopathy, CKD and recent hospitalization for dyspnea that improved markedly after R thoracentesis that yielded 1.5 liters of transudative fluid. She was readmitted a mere 2 days after discharge with recurrent DOE and CXR revealing re-accumulation of her R effusion. She denies fever, chills, sweats, productive cough, hemoptysis. PCCM is asked to assist in further eval and consider repeat thoracentesis   SUBJ: Could not be effectively diuresed yesterday due to hypotension. Remains mildly dyspneic @ rest. No new complaints  EXAM:  Gen: elderly, chronically ill appearing, no overt resp distress HEENT: Poor dentition, otherwise WNL Neck: No JVD noted Lungs: dull to percussion with bronchial BS approx 1/2 up on R. No wheezes Cardiovascular: RRR, no M noted Abdomen: soft, NT, NABS Ext: warm, no edema Neuro: grossly intact  DATA:  BMET    Component Value Date/Time   NA 137 05/09/2013 0420   K 4.3 05/09/2013 0420   CL 94* 05/09/2013 0420   CO2 39* 05/09/2013 0420   GLUCOSE 208* 05/09/2013 0420   BUN 61* 05/09/2013 0420   CREATININE 1.50* 05/09/2013 0420   CALCIUM 8.8 05/09/2013 0420   GFRNONAA 32* 05/09/2013 0420   GFRAA 37* 05/09/2013 0420    CBC    Component Value Date/Time   WBC 10.0 05/08/2013 0400   RBC 4.27 05/08/2013 0400   HGB 12.5 05/08/2013 0400   HCT 39.3 05/08/2013 0400   PLT 176 05/08/2013 0400   MCV 92.0 05/08/2013 0400   MCH 29.3 05/08/2013 0400   MCHC 31.8 05/08/2013 0400   RDW 14.4 05/08/2013 0400   LYMPHSABS 0.8 02/05/2013 1235   MONOABS 0.4 02/05/2013 1235   EOSABS 0.0 02/05/2013 1235   BASOSABS 0.0 02/05/2013 1235    CXR: moderate R effusion, markedly increased since post thoracentesis film of 8/22 and approx same as pre-thora fil  ECHO 8/23: LVEF 30-35%  EKG: paced rhythm  IMPRESSION:   Recurrent large  transudative R pleural effusion due to severe ischemic CM (systolic CHF). Rapid reaccumulation after thoracentesis 8/22, now s/p repeat thora on 8/30. Note that diuretics are being adjusted but that renal fxn is a limiting factor  PLAN:  If pleural effusion recurs despite adjustment in cardiac meds, agree with Pleurex catheter placement to prevent hospital readmissions. She would need to accept the risks, including possible pleural infxn.   Levy Pupa, MD, PhD 05/09/2013, 11:46 AM [facility] [phone] or if no answer [phone]

## 2013-05-09 NOTE — Progress Notes (Signed)
Report given to receiving RN. Patient is stable. No signs or symptoms of distress or discomfort.  

## 2013-05-09 NOTE — Progress Notes (Signed)
Subjective:  Sleeping. Arousable. Breathing better.   Objective:  Vital Signs in the last 24 hours: Temp:  [97.6 F (36.4 C)-98.3 F (36.8 C)] 97.6 F (36.4 C) (09/02 0510) Pulse Rate:  [70] 70 (09/02 0510) Resp:  [18] 18 (09/02 0510) BP: (117-122)/(50-54) 122/54 mmHg (09/02 0510) SpO2:  [99 %-100 %] 100 % (09/02 0510) Weight:  [76.522 kg (168 lb 11.2 oz)] 76.522 kg (168 lb 11.2 oz) (09/02 0510)  Intake/Output from previous day: 09/01 0701 - 09/02 0700 In: 780 [P.O.:780] Out: 751 [Urine:750; Stool:1]   Physical Exam: General: Elderly, in no acute distress. Head:  Normocephalic and atraumatic. Lungs: Mild crackles B, no wheeze, decreased BS bases. Heart: Normal S1 and S2.  No murmur, rubs or gallops.  Abdomen: soft, non-tender, positive bowel sounds. Extremities: No clubbing or cyanosis. 2+ BLE edema. Neurologic: Alert and oriented x 3.    Lab Results:  Recent Labs  05/07/13 0430 05/08/13 0400  WBC 8.9 10.0  HGB 12.5 12.5  PLT 166 176    Recent Labs  05/08/13 0400 05/09/13 0420  NA 136 137  K 4.2 4.3  CL 94* 94*  CO2 38* 39*  GLUCOSE 219* 208*  BUN 58* 61*  CREATININE 1.63* 1.50*    Imaging: Dg Chest 2 View  05/08/2013   *RADIOLOGY REPORT*  Clinical Data: Recurrent pleural effusion.  Cough.  Short of breath.  CHEST - 2 VIEW  Comparison: 05/08/2013, 0851 hours.  Findings: Dual lead left subclavian pacemaker.  Cardiomegaly.  Low lung volumes.  Small to moderate bilateral pleural effusions and basilar atelectasis. There is pulmonary vascular congestion, interstitial and mild alveolar pulmonary edema.  No pneumothorax.  Compared to the exam earlier today, aeration is slightly improved, likely representing interval diuresis.  IMPRESSION: Moderate CHF.   Original Report Authenticated By: Andreas Newport, M.D.   Dg Chest 2 View  05/08/2013   *RADIOLOGY REPORT*  Clinical Data: Evaluate right-sided pleural effusion after thoracentesis.  CHEST - 2 VIEW  Comparison: Chest  x-ray 05/06/2013.  Findings: There is an unusual lucency both medially and laterally in the inferior right hemithorax on the frontal projection, which is suspicious for potential small pneumothorax, however, there is a right pleural effusion which is small, and this effusion does not appear to layer horizontally as would typically be seen in the setting of hydropneumothorax.  Small left pleural effusion is also noted.  Lung volumes are low, with bibasilar opacities which may reflect areas of atelectasis and/or consolidation.  Cephalization of the pulmonary vasculature, without frank pulmonary edema.  Heart size appears mildly enlarged.  Mediastinal contours are unremarkable.  Atherosclerosis of the thoracic aorta.  Left-sided pacemaker device with lead tips projecting over the expected location of the right atrium and right ventricular apex.  IMPRESSION: 1.  Unusual appearance of the right hemithorax, favored to reflect a some skin fold artifact.  Alternatively, a small right hydropneumothorax post thoracentesis is difficult to entirely exclude.  Attention on a follow-up study is suggested, preferably a standing PA and lateral chest radiograph to exclude pneumothorax. 2.  Small left pleural effusion. 3.  Cardiomegaly with pulmonary venous congestion, but no frank pulmonary edema. 4.  Bibasilar atelectasis and/or consolidation. 5.  Atherosclerosis.  These results were called by telephone on 05/08/2013 at 09:10 a.m. to nurse Lupita Leash (for Dr. Gala Romney), who verbally acknowledged these results.   Original Report Authenticated By: Trudie Reed, M.D.   Personally viewed.   Telemetry: NSR Personally viewed.  Assessment/Plan:  Principal Problem:   Acute on  chronic systolic CHF (congestive heart failure) Active Problems:   CKD (chronic kidney disease) stage 4, GFR 15-29 ml/min   Cardiomyopathy, ischemic   CAD (coronary artery disease)   Encephalopathy acute   Pleural effusion - right   Cardiac pacemaker in  situ   - Not diuresing well.   - Will increase demadex to 80mg  PO BID.   - Monitor renal fnx closely.   - Tap 1.7 L 8/30, right sided.   - May need Pluerx if continues to accumulate.    Miranda Cruz 05/09/2013, 11:09 AM

## 2013-05-09 NOTE — Progress Notes (Signed)
LOS review.

## 2013-05-09 NOTE — Progress Notes (Signed)
[facility] PROGRESS NOTE  Miranda Cruz NWG:[id number] DOB: 21-Nov-1929 DOA: 05/04/2013 PCP: Hoyle Sauer, MD  Assessment/Plan: Recurrent right-sided pleural effusion  Patient undergoing ultrasound-guided thoracentesis on this admission. Pulmonary critical care consulted. A repeat chest x-ray PA lateral yesterday showing mild to moderate bilateral pleural effusions. There does not seem to be a rapid reaccumulation of pleural effusion. Diuretic therapy is onboard with torsemide. Will touch base with pulmonary critical care today.   Acute hypoxemic respiratory failure Likely secondary to recurrent right-sided pleural effusion. Status post ultrasound-guided thoracentesis on 05/06/2013 with removal of 1.7 L of fluid. Patient remaining stable.  Congestive heart failure, systolic Patient with history of ischemic cardiac myopathy, having an ejection fraction of 30-35% based on transthoracic echocardiogram on 04/29/2013. Cardiology switching her to Demadex 40 mg by mouth twice a day yesterday. We'll follow ins and outs. Kidney function stable  Stage III chronic kidney disease Patient's kidney function stable, with creatinine fluctuating between 1.5 1.6  Type 2 diabetes mellitus Will continue status he'll coverage with Accu-Cheks q. a.c. and each bedtime  Code Status: Full code Plan has been discussed with patient    Consultants:  Pulmonary medicine  Cardiology  Procedures:  Ultrasound-guided thoracentesis performed on 05/06/2013   HPI/Subjective: Patient today reports doing about the same. She has been ambulated down the hallway and back, though she reports having ongoing dyspnea, overall there is improvement from when she first came in. During my encounter she does not appear to be in acute respiratory distress. She has been afebrile, tolerating by mouth intake. Tolerating torsemide therapy. Creatinine remaining stable likely between 1.5-1.6  Objective: Filed Vitals:   05/09/13 0510  BP: 122/54  Pulse: 70  Temp: 97.6 F (36.4 C)  Resp: 18    Intake/Output Summary (Last 24 hours) at 05/09/13 1103 Last data filed at 05/09/13 0938  Gross per 24 hour  Intake    600 ml  Output    552 ml  Net     48 ml   Filed Weights   05/07/13 0522 05/08/13 0510 05/09/13 0510  Weight: 75.796 kg (167 lb 1.6 oz) 75.8 kg (167 lb 1.7 oz) 76.522 kg (168 lb 11.2 oz)    Exam:   General:  Patient in mild respiratory distress. She is awake alert oriented times  Cardiovascular: Regular rate and rhythm normal S1-S2  Respiratory: On lung exam this morning, I think there may be some diminished breath sounds to her right base.  Abdomen: Abdomen is soft nontender nontender positive bowel sound  Musculoskeletal: Present range of motion of all extremities  Data Reviewed: Basic Metabolic Panel:  Recent Labs Lab 05/05/13 0600 05/06/13 0432 05/07/13 0430 05/08/13 0400 05/09/13 0420  NA 134* 134* 135 136 137  K 4.1 4.7 4.1 4.2 4.3  CL 93* 94* 94* 94* 94*  CO2 28 29 36* 38* 39*  GLUCOSE 182* 154* 259* 219* 208*  BUN 58* 58* 56* 58* 61*  CREATININE 1.71* 1.68* 1.55* 1.63* 1.50*  CALCIUM 8.9 8.6 8.5 8.5 8.8  MG  --   --   --  1.8  --    Liver Function Tests:  Recent Labs Lab 05/04/13 1610  AST 42*  ALT 15  ALKPHOS 131*  BILITOT 0.4  PROT 6.2  ALBUMIN 2.5*   No results found for this basename: LIPASE, AMYLASE,  in the last 168 hours No results found for this basename: AMMONIA,  in the last 168 hours CBC:  Recent Labs Lab 05/04/13 1610 05/05/13 0600 05/06/13 0432  05/07/13 0430 05/08/13 0400  WBC 9.5 9.1 8.8 8.9 10.0  HGB 13.8 13.3 13.4 12.5 12.5  HCT 41.4 41.0 41.9 38.5 39.3  MCV 90.0 91.3 91.1 91.2 92.0  PLT 180 197 170 166 176   Cardiac Enzymes:  Recent Labs Lab 05/04/13 1610  TROPONINI <0.30   BNP (last 3 results)  Recent Labs  04/27/13 2019 05/06/13 0432 05/07/13 0430  PROBNP 6703.0* 4186.0* 4272.0*   CBG:  Recent Labs Lab  05/05/13 0733 05/06/13 0617 05/07/13 0616 05/08/13 0606 05/09/13 0652  GLUCAP 192* 164* 179* 251* 194*    Recent Results (from the past 240 hour(s))  BODY FLUID CULTURE     Status: None   Collection Time    05/06/13 12:27 PM      Result Value Range Status   Specimen Description FLUID RIGHT PLEURAL   Final   Special Requests NONE   Final   Gram Stain     Final   Value: CYTOSPIN WBC PRESENT,BOTH PMN AND MONONUCLEAR     NO ORGANISMS SEEN     Performed at [facility]   Culture     Final   Value: NO GROWTH 2 DAYS     Performed at [facility]   Report Status PENDING   Incomplete     Studies: Dg Chest 2 View  05/08/2013   *RADIOLOGY REPORT*  Clinical Data: Recurrent pleural effusion.  Cough.  Short of breath.  CHEST - 2 VIEW  Comparison: 05/08/2013, 0851 hours.  Findings: Dual lead left subclavian pacemaker.  Cardiomegaly.  Low lung volumes.  Small to moderate bilateral pleural effusions and basilar atelectasis. There is pulmonary vascular congestion, interstitial and mild alveolar pulmonary edema.  No pneumothorax.  Compared to the exam earlier today, aeration is slightly improved, likely representing interval diuresis.  IMPRESSION: Moderate CHF.   Original Report Authenticated By: Andreas Newport, M.D.   Dg Chest 2 View  05/08/2013   *RADIOLOGY REPORT*  Clinical Data: Evaluate right-sided pleural effusion after thoracentesis.  CHEST - 2 VIEW  Comparison: Chest x-ray 05/06/2013.  Findings: There is an unusual lucency both medially and laterally in the inferior right hemithorax on the frontal projection, which is suspicious for potential small pneumothorax, however, there is a right pleural effusion which is small, and this effusion does not appear to layer horizontally as would typically be seen in the setting of hydropneumothorax.  Small left pleural effusion is also noted.  Lung volumes are low, with bibasilar opacities which may reflect areas of atelectasis and/or  consolidation.  Cephalization of the pulmonary vasculature, without frank pulmonary edema.  Heart size appears mildly enlarged.  Mediastinal contours are unremarkable.  Atherosclerosis of the thoracic aorta.  Left-sided pacemaker device with lead tips projecting over the expected location of the right atrium and right ventricular apex.  IMPRESSION: 1.  Unusual appearance of the right hemithorax, favored to reflect a some skin fold artifact.  Alternatively, a small right hydropneumothorax post thoracentesis is difficult to entirely exclude.  Attention on a follow-up study is suggested, preferably a standing PA and lateral chest radiograph to exclude pneumothorax. 2.  Small left pleural effusion. 3.  Cardiomegaly with pulmonary venous congestion, but no frank pulmonary edema. 4.  Bibasilar atelectasis and/or consolidation. 5.  Atherosclerosis.  These results were called by telephone on 05/08/2013 at 09:10 a.m. to nurse Lupita Leash (for Dr. Gala Romney), who verbally acknowledged these results.   Original Report Authenticated By: Trudie Reed, M.D.    Scheduled Meds: . allopurinol  100  mg Oral Daily  . ALPRAZolam  1 mg Oral QHS  . aspirin EC  81 mg Oral QHS  . atorvastatin  40 mg Oral QHS  . feeding supplement  237 mL Oral Q24H  . heparin  5,000 Units Subcutaneous Q8H  . isosorbide mononitrate  30 mg Oral Daily  . levothyroxine  125 mcg Oral QAC breakfast  . pantoprazole  40 mg Oral Daily  . sevelamer carbonate  800 mg Oral BID WC  . sodium chloride  3 mL Intravenous Q12H  . sodium chloride  3 mL Intravenous Q12H  . torsemide  40 mg Oral BID  . Vitamin D (Ergocalciferol)  50,000 Units Oral Q M,W,F   Continuous Infusions:   Principal Problem:   Acute on chronic systolic CHF (congestive heart failure) Active Problems:   CKD (chronic kidney disease) stage 4, GFR 15-29 ml/min   Cardiomyopathy, ischemic   CAD (coronary artery disease)   Encephalopathy acute   Pleural effusion - right   Cardiac  pacemaker in situ    Time spent: 35 min   Jeralyn Bennett  [facility] Pager [phone] 7PM-7AM, please contact night-coverage at [email or web address], password [facility] 05/09/2013, 11:03 AM  LOS: 5 days

## 2013-05-09 NOTE — Progress Notes (Signed)
Interim Discharge Summary   Discharge Diagnosis  1) Recurrent Right Sided Pleural Effusion, likely secondary to CHF 2) Congestive Heart Failure, systolic, s/p transthorasic echocardiogram performed on 04/29/13 showing LVEF of 30-35% 3) Acute Hypoxemic Respiratory Failure, secondary to pleural effusion 4) Stage III CKD, baseline creatinine1.5-1.6 5) Type II DM 6) CAD s/p PCI 7) S/P pacemaker placement 8) H/O hip arthroplasty   Consultations: Cardiology: Dr Arvilla Meres Pulmonary/Critical Care: Dr Jetty Duhamel  Brief  History and Hospital Course Patient is a pleasant 79 year old with a PMH of ischemic cardiomyopathy, having an EF 30-35% admitted on 05/04/13 presenting with worsening shortness of breath, recently discharged from our service at which time she presented with similar complaints, found to have a large pleural effusion secondary to CHF and underwent thoracentesis. She was found to have a rapid reaccumulation of pleural effusion on presenting imaging studies. Pulmonary Critical Care was consulted for treatment options of recurrent effusions. She underwent US guided thoracentesis on 05/06/2013 with removal of 1700 mL of amber colored fluid. A chest X-Ray was repeated yesterday which did not show evidence of rapid accumulation. Patient's diuretics have been managed by cardiology. If there is recurrence of pleural effusion despite maximizing diuresis, will likely place Pleurex Catheter. Follow up on am BMP, if patient is stable, I anticipate discharge in the next 24-48 hours.

## 2013-05-09 NOTE — Progress Notes (Signed)
Inpatient Diabetes Program Recommendations  AACE/ADA: New Consensus Statement on Inpatient Glycemic Control (2013)  Target Ranges:  Prepandial:   less than 140 mg/dL      Peak postprandial:   less than 180 mg/dL (1-2 hours)      Critically ill patients:  140 - 180 mg/dL   Results for ROZA, CREAMER (MRN [id number]) as of 05/09/2013 11:46  Ref. Range 05/06/2013 06:17 05/07/2013 06:16 05/08/2013 06:06 05/09/2013 06:52  Glucose-Capillary Latest Range: 70-99 mg/dL 952 (H) 841 (H) 324 (H) 194 (H)  Results for AHLIYA, GLATT (MRN [id number]) as of 05/09/2013 11:46  Ref. Range 05/07/2013 04:30 05/08/2013 04:00 05/09/2013 04:20  Glucose Latest Range: 70-99 mg/dL 664 (H) 403 (H) 474 (H)    Inpatient Diabetes Program Recommendations Correction (SSI): Please consider ordering CBGs ACHS with Novolog sensitive correction scale. HgbA1C: Please consider ordering an A1C to determine glycemic control over the past 2-3 months.  Note: Patient has a history of diabetes and takes Glyburide 1.25 mg daily (as needed if BG > 120 mg/dl) as an outpatient for diabetes management.  Currently, patient is not ordered to receive any medications for inpatient glycemic control and has CBGs ordered every am.  Fasting lab glucose over the past 3 mornings has been greater than 200 mg/dl.  Please consider ordering CBGs with Novolog sensitive correction ACHS while inpatient.  Will continue to follow.  Thanks, Orlando Penner, RN, MSN, CCRN Diabetes Coordinator Inpatient Diabetes Program [phone]

## 2013-05-09 NOTE — Progress Notes (Signed)
Co-signed for Tiffany Nichols RN/BSN for assessments, IV assessments, I's & O's, care plans, patient educations, progress notes, medication administration, and vital signs. Latham Kinzler M, RN/BSN 

## 2013-05-10 ENCOUNTER — Inpatient Hospital Stay (HOSPITAL_COMMUNITY): Payer: Medicare Other

## 2013-05-10 DIAGNOSIS — N179 Acute kidney failure, unspecified: Secondary | ICD-10-CM

## 2013-05-10 DIAGNOSIS — E119 Type 2 diabetes mellitus without complications: Secondary | ICD-10-CM

## 2013-05-10 LAB — GLUCOSE, CAPILLARY
Glucose-Capillary: 172 mg/dL — ABNORMAL HIGH (ref 70–99)
Glucose-Capillary: 252 mg/dL — ABNORMAL HIGH (ref 70–99)

## 2013-05-10 LAB — BASIC METABOLIC PANEL
BUN: 59 mg/dL — ABNORMAL HIGH (ref 6–23)
CO2: 36 mEq/L — ABNORMAL HIGH (ref 19–32)
Calcium: 8.7 mg/dL (ref 8.4–10.5)
Creatinine, Ser: 1.39 mg/dL — ABNORMAL HIGH (ref 0.50–1.10)
Glucose, Bld: 226 mg/dL — ABNORMAL HIGH (ref 70–99)

## 2013-05-10 MED ORDER — HYDRALAZINE HCL 10 MG PO TABS
10.0000 mg | ORAL_TABLET | Freq: Three times a day (TID) | ORAL | Status: DC
  Administered 2013-05-10 – 2013-05-12 (×6): 10 mg via ORAL
  Filled 2013-05-10 (×9): qty 1

## 2013-05-10 MED ORDER — TORSEMIDE 100 MG PO TABS
100.0000 mg | ORAL_TABLET | Freq: Two times a day (BID) | ORAL | Status: DC
  Administered 2013-05-10 – 2013-05-13 (×6): 100 mg via ORAL
  Filled 2013-05-10 (×10): qty 1

## 2013-05-10 NOTE — Progress Notes (Signed)
PULMONARY/CCM NOTE  Requesting MD/Service: Zamora/[facility] Date of admission: 8/28 Date of consult: 8/29 Reason for consultation: recurrent pl eff   HPI/Pt Profile:  79 yo F with cardiomyopathy, CKD and recent hospitalization for dyspnea that improved markedly after R thoracentesis that yielded 1.5 liters of transudative fluid. She was readmitted a mere 2 days after discharge with recurrent DOE and CXR revealing re-accumulation of her R effusion. She denies fever, chills, sweats, productive cough, hemoptysis. PCCM is asked to assist in further eval and consider repeat thoracentesis   SUBJ: Feels more tired today, slightly more SOB.    EXAM:  Gen: elderly, chronically ill appearing, no overt resp distress HEENT: Poor dentition, otherwise WNL Neck: No JVD noted Lungs: dull to percussion diminished R. No wheezes Cardiovascular: RRR, no M noted Abdomen: soft, NT, NABS Ext: warm, no edema Neuro: grossly intact  DATA:  BMET    Component Value Date/Time   NA 134* 05/10/2013 0457   K 4.6 05/10/2013 0457   CL 92* 05/10/2013 0457   CO2 36* 05/10/2013 0457   GLUCOSE 226* 05/10/2013 0457   BUN 59* 05/10/2013 0457   CREATININE 1.39* 05/10/2013 0457   CALCIUM 8.7 05/10/2013 0457   GFRNONAA 35* 05/10/2013 0457   GFRAA 40* 05/10/2013 0457    CBC    Component Value Date/Time   WBC 10.0 05/08/2013 0400   RBC 4.27 05/08/2013 0400   HGB 12.5 05/08/2013 0400   HCT 39.3 05/08/2013 0400   PLT 176 05/08/2013 0400   MCV 92.0 05/08/2013 0400   MCH 29.3 05/08/2013 0400   MCHC 31.8 05/08/2013 0400   RDW 14.4 05/08/2013 0400   LYMPHSABS 0.8 02/05/2013 1235   MONOABS 0.4 02/05/2013 1235   EOSABS 0.0 02/05/2013 1235   BASOSABS 0.0 02/05/2013 1235    Dg Chest 2 View  05/08/2013   *RADIOLOGY REPORT*  Clinical Data: Recurrent pleural effusion.  Cough.  Short of breath.  CHEST - 2 VIEW  Comparison: 05/08/2013, 0851 hours.  Findings: Dual lead left subclavian pacemaker.  Cardiomegaly.  Low lung volumes.  Small to moderate bilateral pleural  effusions and basilar atelectasis. There is pulmonary vascular congestion, interstitial and mild alveolar pulmonary edema.  No pneumothorax.  Compared to the exam earlier today, aeration is slightly improved, likely representing interval diuresis.  IMPRESSION: Moderate CHF.   Original Report Authenticated By: Andreas Newport, M.D.     ECHO 8/23: LVEF 30-35%  EKG: paced rhythm  IMPRESSION:   Recurrent large transudative R pleural effusion due to severe ischemic CM (systolic CHF). Rapid reaccumulation after thoracentesis 8/22, now s/p repeat thora on 8/30. Note that diuretics are being adjusted but that renal fxn and BP are limiting factors.   PLAN:  -f/un CXR pending this am -if effusion has recurred - would proceed with pleur-x placement  -consider pleurx placement asap (?this afternoon) as timing of anticoag in setting AFIB dependent on placement  -discussed risks/benefits at length with pt/family at bedside this am   [facility], NP 05/10/2013  9:42 AM Pager: [phone] or [phone]  *Care during the described time interval was provided by me and/or other providers on the critical care team. I have reviewed this patient's available data, including medical history, events of note, physical examination and test results as part of my evaluation.    Levy Pupa, MD, PhD 05/10/2013, 12:25 PM [facility] [phone] or if no answer [phone]

## 2013-05-10 NOTE — Progress Notes (Signed)
Pt. Alert and oriented this am. No s/s distress or discomfort during the night. Pt. C/o lower back pain x1. PRN pain medication administered and effective. Pt. C/o of full feeling in lower abdomen. Bladder scan completed and 500 mL noted in bladder. Pt. Able to void 100cc after bladder scan. On call MD notified. Order for I/O cath as needed given. On coming RN made aware. RN will continue to monitor pt. For changes in condition. Thelbert Gartin, Cheryll Dessert

## 2013-05-10 NOTE — Progress Notes (Signed)
Thank you for consulting the Palliative Medicine Team at [facility] to meet your patient's and family's needs.   The reason that you asked [place] to see your patient is for goals of care discussion  We have scheduled your patient for a meeting: tomorrow, Thursday 05/11/13 @ 2:00 pm  The Surrogate decision maker is: per patient, her daughters Paulena Servais c: [phone] and Edward Jolly c: [phone] help with her decisions  Other family members that need to be present:  If other children can be present they will come Daughter [name] to make siblings aware of meeting time  Your patient is able to participate- on this visit patient alert but sleepy requesting writer to contact daughters Nita Sells and McGrath  Additional Narrative: per nursing staff and discussion with patient - patient may be having pleurex catheter placed later today   Valente David, RN 05/10/2013, 2:44 PM Palliative Medicine Team RN Liaison [phone]

## 2013-05-10 NOTE — Progress Notes (Signed)
Subjective:  A little more SOB, tired today.   Objective:  Vital Signs in the last 24 hours: Temp:  [97.7 F (36.5 C)-98.6 F (37 C)] 98.4 F (36.9 C) (09/03 0503) Pulse Rate:  [70-73] 70 (09/03 0503) Resp:  [18] 18 (09/03 0503) BP: (100-147)/(52-60) 127/60 mmHg (09/03 0503) SpO2:  [93 %-97 %] 93 % (09/03 0503) Weight:  [76.567 kg (168 lb 12.8 oz)] 76.567 kg (168 lb 12.8 oz) (09/03 0503)  Intake/Output from previous day: 09/02 0701 - 09/03 0700 In: 480 [P.O.:480] Out: 603 [Urine:601; Stool:2]   Physical Exam: General: Elderly, in no acute distress.  Head: Normocephalic and atraumatic.  Lungs: Mild crackles B, mild wheeze, decreased BS bases.  Heart: Normal S1 and S2. No murmur, rubs or gallops.  Abdomen: soft, non-tender, positive bowel sounds.  Extremities: No clubbing or cyanosis. 2+ BLE edema.  Neurologic: Alert and oriented x 3.  Lab Results:  Recent Labs  05/08/13 0400  WBC 10.0  HGB 12.5  PLT 176    Recent Labs  05/09/13 0420 05/10/13 0457  NA 137 134*  K 4.3 4.6  CL 94* 92*  CO2 39* 36*  GLUCOSE 208* 226*  BUN 61* 59*  CREATININE 1.50* 1.39*    Imaging: Dg Chest 2 View  05/08/2013   *RADIOLOGY REPORT*  Clinical Data: Recurrent pleural effusion.  Cough.  Short of breath.  CHEST - 2 VIEW  Comparison: 05/08/2013, 0851 hours.  Findings: Dual lead left subclavian pacemaker.  Cardiomegaly.  Low lung volumes.  Small to moderate bilateral pleural effusions and basilar atelectasis. There is pulmonary vascular congestion, interstitial and mild alveolar pulmonary edema.  No pneumothorax.  Compared to the exam earlier today, aeration is slightly improved, likely representing interval diuresis.  IMPRESSION: Moderate CHF.   Original Report Authenticated By: Andreas Newport, M.D.   Dg Chest 2 View  05/08/2013   *RADIOLOGY REPORT*  Clinical Data: Evaluate right-sided pleural effusion after thoracentesis.  CHEST - 2 VIEW  Comparison: Chest x-ray 05/06/2013.  Findings:  There is an unusual lucency both medially and laterally in the inferior right hemithorax on the frontal projection, which is suspicious for potential small pneumothorax, however, there is a right pleural effusion which is small, and this effusion does not appear to layer horizontally as would typically be seen in the setting of hydropneumothorax.  Small left pleural effusion is also noted.  Lung volumes are low, with bibasilar opacities which may reflect areas of atelectasis and/or consolidation.  Cephalization of the pulmonary vasculature, without frank pulmonary edema.  Heart size appears mildly enlarged.  Mediastinal contours are unremarkable.  Atherosclerosis of the thoracic aorta.  Left-sided pacemaker device with lead tips projecting over the expected location of the right atrium and right ventricular apex.  IMPRESSION: 1.  Unusual appearance of the right hemithorax, favored to reflect a some skin fold artifact.  Alternatively, a small right hydropneumothorax post thoracentesis is difficult to entirely exclude.  Attention on a follow-up study is suggested, preferably a standing PA and lateral chest radiograph to exclude pneumothorax. 2.  Small left pleural effusion. 3.  Cardiomegaly with pulmonary venous congestion, but no frank pulmonary edema. 4.  Bibasilar atelectasis and/or consolidation. 5.  Atherosclerosis.  These results were called by telephone on 05/08/2013 at 09:10 a.m. to nurse Lupita Leash (for Dr. Gala Romney), who verbally acknowledged these results.   Original Report Authenticated By: Trudie Reed, M.D.     Telemetry: AFIB V paced Personally viewed.   EKG:  AFIB, V paced   Assessment/Plan:  Principal Problem:   Acute on chronic systolic CHF (congestive heart failure) Active Problems:   CKD (chronic kidney disease) stage 4, GFR 15-29 ml/min   Cardiomyopathy, ischemic   CAD (coronary artery disease)   Encephalopathy acute   Pleural effusion - right   Cardiac pacemaker in situ  1)  AFIB  - careful review of tele and prior ECG's demonstrate AFIB  - discussed anticoagulation options (NOAC)  - I would like to see a determination of Pleurex cath prior to initiation of anticoagulation.  - elevated CHADS-VAS. Should be on anticoagulation  - Well rate controlled afib. Continue with rate control.   - Unlikely to maintain NSR with underlying cardiomyopathy  2) Acute on chronic systolic HF  - EF 30  - Increased torsemide on 9/2 from 40 to 80 BID  - Bladder scan with residual.   - Creat decreased to 1.39  - Try to add Bb when able.   3) Pacemaker only  - functioning well  4) DM 5) HL 6) Pleural effusions  - as above  - consider repeat CXR  - pulm recs.    SKAINS, MARK 05/10/2013, 8:56 AM

## 2013-05-10 NOTE — Progress Notes (Signed)
[facility] PROGRESS NOTE  Assessment/Plan: Acute on chronic systolic CHF (congestive heart failure) EF 35%: - On torsemide twice a day, Imdur add hydralazine. Will increase torsemide. - Cr trending down. - Strict I and O's., has not had good UOP. - Cardiology consulted. - Pt and family will like to meet with palliative care.  A fib: - EKG showed afib, cardiology considering NOAC's - currently rate controlled.  Pleural effusion - right due to Heart failure: -pulmonary consult rec pleurex cath.  CKD (chronic kidney disease) stage 4, GFR 15-29 ml/min - improving with diureses.  Encephalopathy acute     Code Status: full Family Communication: none  Disposition Plan: inpatient   Consultants:  Cardiology  Pulmonary  Procedures:  CT chest  Pleurex cath.  Antibiotics:  None  HPI/Subjective: Miranda Cruz relates her SOB is mildly improved. But sill feels weak.  Objective: Filed Vitals:   05/09/13 0510 05/09/13 1414 05/09/13 2027 05/10/13 0503  BP: 122/54 100/52 147/58 127/60  Pulse: 70 70 73 70  Temp: 97.6 F (36.4 C) 98.6 F (37 C) 97.7 F (36.5 C) 98.4 F (36.9 C)  TempSrc: Oral Oral Oral Oral  Resp: 18 18 18 18   Height:      Weight: 76.522 kg (168 lb 11.2 oz)   76.567 kg (168 lb 12.8 oz)  SpO2: 100% 97% 96% 93%    Intake/Output Summary (Last 24 hours) at 05/10/13 1202 Last data filed at 05/10/13 1100  Gross per 24 hour  Intake    480 ml  Output   1101 ml  Net   -621 ml   Filed Weights   05/08/13 0510 05/09/13 0510 05/10/13 0503  Weight: 75.8 kg (167 lb 1.7 oz) 76.522 kg (168 lb 11.2 oz) 76.567 kg (168 lb 12.8 oz)    Exam:  General: Alert, awake, oriented x3, in no acute distress.  HEENT: No bruits, no goiter.  Heart: Regular rate and rhythm, without murmurs, rubs, gallops.  Lungs: Good air movement, bilateral air movement.  Abdomen: Soft, nontender, nondistended, positive bowel sounds.  Neuro: Grossly intact, nonfocal.   Data  Reviewed: Basic Metabolic Panel:  Recent Labs Lab 05/06/13 0432 05/07/13 0430 05/08/13 0400 05/09/13 0420 05/10/13 0457  NA 134* 135 136 137 134*  K 4.7 4.1 4.2 4.3 4.6  CL 94* 94* 94* 94* 92*  CO2 29 36* 38* 39* 36*  GLUCOSE 154* 259* 219* 208* 226*  BUN 58* 56* 58* 61* 59*  CREATININE 1.68* 1.55* 1.63* 1.50* 1.39*  CALCIUM 8.6 8.5 8.5 8.8 8.7  MG  --   --  1.8  --   --    Liver Function Tests:  Recent Labs Lab 05/04/13 1610  AST 42*  ALT 15  ALKPHOS 131*  BILITOT 0.4  PROT 6.2  ALBUMIN 2.5*   No results found for this basename: LIPASE, AMYLASE,  in the last 168 hours No results found for this basename: AMMONIA,  in the last 168 hours CBC:  Recent Labs Lab 05/04/13 1610 05/05/13 0600 05/06/13 0432 05/07/13 0430 05/08/13 0400  WBC 9.5 9.1 8.8 8.9 10.0  HGB 13.8 13.3 13.4 12.5 12.5  HCT 41.4 41.0 41.9 38.5 39.3  MCV 90.0 91.3 91.1 91.2 92.0  PLT 180 197 170 166 176   Cardiac Enzymes:  Recent Labs Lab 05/04/13 1610  TROPONINI <0.30   BNP (last 3 results)  Recent Labs  04/27/13 2019 05/06/13 0432 05/07/13 0430  PROBNP 6703.0* 4186.0* 4272.0*   CBG:  Recent Labs Lab 05/09/13 1610 05/09/13  1656 05/09/13 2124 05/10/13 0557 05/10/13 1115  GLUCAP 194* 249* 218* 172* 252*    Recent Results (from the past 240 hour(s))  BODY FLUID CULTURE     Status: None   Collection Time    05/06/13 12:27 PM      Result Value Range Status   Specimen Description FLUID RIGHT PLEURAL   Final   Special Requests NONE   Final   Gram Stain     Final   Value: CYTOSPIN WBC PRESENT,BOTH PMN AND MONONUCLEAR     NO ORGANISMS SEEN     Performed at [facility]   Culture     Final   Value: NO GROWTH 3 DAYS     Performed at [facility]   Report Status 05/09/2013 FINAL   Final     Studies: No results found.  Scheduled Meds: . allopurinol  100 mg Oral Daily  . ALPRAZolam  1 mg Oral QHS  . aspirin EC  81 mg Oral QHS  . atorvastatin  40 mg Oral  QHS  . feeding supplement  237 mL Oral Q24H  . heparin  5,000 Units Subcutaneous Q8H  . insulin aspart  0-15 Units Subcutaneous TID WC  . insulin aspart  0-5 Units Subcutaneous QHS  . isosorbide mononitrate  30 mg Oral Daily  . levothyroxine  125 mcg Oral QAC breakfast  . pantoprazole  40 mg Oral Daily  . sevelamer carbonate  800 mg Oral BID WC  . sodium chloride  3 mL Intravenous Q12H  . sodium chloride  3 mL Intravenous Q12H  . torsemide  80 mg Oral BID  . Vitamin D (Ergocalciferol)  50,000 Units Oral Q M,W,F   Continuous Infusions:    Marinda Elk  [facility] Pager [phone]. If 8PM-8AM, please contact night-coverage at [email or web address], password [facility] 05/10/2013, 12:02 PM  LOS: 6 days

## 2013-05-10 NOTE — Progress Notes (Addendum)
Patient ID: Miranda Cruz, female   DOB: 1929/11/10, 79 y.o.   MRN: [id number]   Pt with CHF and known recurrent Rt pleural effusion  R Thora 8/22: 1.5L; neg malig cells R Thora 8/30: 1.7L; neg malig cells  Most recent CXR 9/1: small to moderate B pleural effusions  Request made for R PleurX catheter placement  Pt and family scheduled for palliative care mtg 9/4 at 2pm.  Will keep pt NPO and hold Hep inj for 9/4 and recheck CXR to evaluate for enlarging Rt pleural effusion Must have good amt of effusion to accommodate placement of PleurX. Will discuss with pt and family procedure of catheter placement. Noted scheduled palliative meeting tomorrow.  Rad PA will evaluate in am

## 2013-05-10 NOTE — Progress Notes (Signed)
Report given to receiving RN. Patient is stable. No signs or symptoms of distress or discomfort.  

## 2013-05-10 NOTE — Evaluation (Signed)
Physical Therapy Evaluation Patient Details Name: Miranda Cruz MRN: [id number] DOB: 1930-05-09 Today's Date: 05/10/2013 Time: 4401-0272 PT Time Calculation (min): 22 min  PT Assessment / Plan / Recommendation History of Present Illness  79 y.o. female with a Past Medical History of chronic systolic heart failure, ischemic cardiomyopathy, stage III-IV chronic kidney disease who presents today with the above noted complaint. Patient just was discharged from the hospital a few days ago, she claims upon discharge she was doing well, and felt that she was back to her baseline. This morning, patient fell that she was much more short of breath, and felt that if she did not seek medical attention she would get much worse. She went to her primary care practitioner's office, where a chest x-ray apparently demonstrated worsening of the pleural effusion, she was then referred to the hospitalist service as a direct admission.  Clinical Impression  Pt with minimal decline in function compared to PTA. Pt now requires use of RW for safe amb. Suspect pt to progress well s/p pleur-ex catheter placement and be able to d/c home. PT to con't to follow and progress pt mobility for safe d/c home.    PT Assessment  Patient needs continued PT services    Follow Up Recommendations  Home health PT;Supervision/Assistance - 24 hour    Does the patient have the potential to tolerate intense rehabilitation      Barriers to Discharge Decreased caregiver support      Equipment Recommendations  None recommended by PT    Recommendations for Other Services     Frequency Min 3X/week    Precautions / Restrictions Precautions Precautions: Fall Restrictions Weight Bearing Restrictions: No   Pertinent Vitals/Pain Denies pain      Mobility  Bed Mobility Bed Mobility: Supine to Sit Supine to Sit: 4: Min guard Details for Bed Mobility Assistance: increased time, HOB elevated use of bed rail Transfers Transfers: Sit to  Stand;Stand to Sit Sit to Stand: 4: Min assist;With upper extremity assist;From bed Stand to Sit: 4: Min guard;With upper extremity assist;To chair/3-in-1 Details for Transfer Assistance: minA to initiate stand Ambulation/Gait Ambulation/Gait Assistance: 4: Min guard Ambulation Distance (Feet): 100 Feet Assistive device: Rolling walker Ambulation/Gait Assistance Details: attempted to amb without RW however pt unsteady and requires minA to maintain balance. Pt agrees she is steadier and more safe with RW Gait Pattern: Step-through pattern;Decreased stride length;Narrow base of support Gait velocity: slow Stairs: No    Exercises     PT Diagnosis: Difficulty walking;Generalized weakness  PT Problem List: Decreased strength;Decreased activity tolerance;Decreased balance;Decreased mobility PT Treatment Interventions: DME instruction;Gait training;Functional mobility training;Therapeutic activities;Therapeutic exercise     PT Goals(Current goals can be found in the care plan section) Acute Rehab PT Goals Patient Stated Goal: return home PT Goal Formulation: With patient Time For Goal Achievement: 05/17/13 Potential to Achieve Goals: Good  Visit Information  Last PT Received On: 05/10/13 Assistance Needed: +1 History of Present Illness: 79 y.o. female with a Past Medical History of chronic systolic heart failure, ischemic cardiomyopathy, stage III-IV chronic kidney disease who presents today with the above noted complaint. Patient just was discharged from the hospital a few days ago, she claims upon discharge she was doing well, and felt that she was back to her baseline. This morning, patient fell that she was much more short of breath, and felt that if she did not seek medical attention she would get much worse. She went to her primary care practitioner's office, where a  chest x-ray apparently demonstrated worsening of the pleural effusion, she was then referred to the hospitalist service  as a direct admission.       Prior Functioning  Home Living Family/patient expects to be discharged to:: Private residence Living Arrangements: Children Available Help at Discharge: Family;Available PRN/intermittently (dtr works during the day) Type of Home: House Home Access: Ramped entrance Home Layout: One level Home Equipment: Environmental consultant - 2 wheels;Bedside commode;Grab bars - tub/shower;Wheelchair - manual Prior Function Level of Independence: Independent with assistive device(s) Comments: pt was amb without RW Communication Communication: No difficulties Dominant Hand: Right    Cognition  Cognition Arousal/Alertness: Awake/alert Behavior During Therapy: WFL for tasks assessed/performed Overall Cognitive Status: Within Functional Limits for tasks assessed    Extremity/Trunk Assessment Upper Extremity Assessment Upper Extremity Assessment: Overall WFL for tasks assessed Lower Extremity Assessment Lower Extremity Assessment: Generalized weakness Cervical / Trunk Assessment Cervical / Trunk Assessment: Normal   Balance    End of Session PT - End of Session Equipment Utilized During Treatment: Gait belt;Oxygen (2Lo2 via [place]) Activity Tolerance: Patient tolerated treatment well Patient left:  (on [facility] with RN call light, RN tech notified) Nurse Communication: Mobility status  GP     Marcene Brawn 05/10/2013, 4:27 PM  Lewis Shock, PT, DPT Pager #: [phone] Office #: [phone]

## 2013-05-11 ENCOUNTER — Encounter (HOSPITAL_COMMUNITY): Payer: Self-pay | Admitting: Radiology

## 2013-05-11 ENCOUNTER — Inpatient Hospital Stay (HOSPITAL_COMMUNITY): Payer: Medicare Other

## 2013-05-11 LAB — GLUCOSE, CAPILLARY
Glucose-Capillary: 153 mg/dL — ABNORMAL HIGH (ref 70–99)
Glucose-Capillary: 258 mg/dL — ABNORMAL HIGH (ref 70–99)
Glucose-Capillary: 272 mg/dL — ABNORMAL HIGH (ref 70–99)

## 2013-05-11 LAB — BASIC METABOLIC PANEL
BUN: 57 mg/dL — ABNORMAL HIGH (ref 6–23)
GFR calc non Af Amer: 32 mL/min — ABNORMAL LOW (ref >90)
Glucose, Bld: 177 mg/dL — ABNORMAL HIGH (ref 70–99)
Potassium: 4.2 mEq/L (ref 3.5–5.1)

## 2013-05-11 LAB — PROTIME-INR: INR: 0.99 (ref 0.00–1.49)

## 2013-05-11 MED ORDER — MIDAZOLAM HCL 2 MG/2ML IJ SOLN
INTRAMUSCULAR | Status: AC | PRN
  Administered 2013-05-11 (×2): 1 mg via INTRAVENOUS

## 2013-05-11 MED ORDER — CEFAZOLIN SODIUM-DEXTROSE 2-3 GM-% IV SOLR
2.0000 g | Freq: Once | INTRAVENOUS | Status: AC
  Administered 2013-05-11: 11:00:00 2 g via INTRAVENOUS
  Filled 2013-05-11 (×2): qty 50

## 2013-05-11 MED ORDER — FENTANYL CITRATE 0.05 MG/ML IJ SOLN
INTRAMUSCULAR | Status: AC | PRN
  Administered 2013-05-11 (×3): 25 ug via INTRAVENOUS

## 2013-05-11 NOTE — Progress Notes (Signed)
Report given to receiving RN. Patient is stable. No signs or symptoms of distress or discomfort.  

## 2013-05-11 NOTE — Progress Notes (Signed)
Patient returned from her pleur-x placement. Patient is stable. No signs or symptoms of distress and no verbal complaints. Dressing is placed over Right upper quadrant and is dry and intact. Will continue to monitor patient for further changes in condition.

## 2013-05-11 NOTE — Progress Notes (Signed)
05/11/13 Patient has had no c/o pain or signs of distress during the shift. She remains A/Ox4 and is on 3 L [place] of oxygen. She has been npo since midnight but did receive scheduled medications with sips of water. Scheduled heparin was held per MD orders for procedure today. She is 1 person assist to the [facility]. Patient's daughter did call to check on patient during the shift.

## 2013-05-11 NOTE — ED Notes (Signed)
1.3 Liters taken off

## 2013-05-11 NOTE — Progress Notes (Signed)
PT Cancellation Note  Patient Details Name: Miranda Cruz MRN: [id number] DOB: Mar 18, 1930   Cancelled Treatment:    Reason Eval/Treat Not Completed: Patient at procedure or test/unavailable.  Pt just returned to room from procedure.  Pt very fatigue and request to hold PT today.   Wendie Diskin 05/11/2013, 3:59 PM  Jake Shark, PT DPT [phone]

## 2013-05-11 NOTE — Procedures (Signed)
Successful placement of a tunneled right sided pleural drainage catheter yielding 1.3 L of serous pleural fluid. No immediate post procedural complications.

## 2013-05-11 NOTE — Progress Notes (Signed)
[facility] PROGRESS NOTE  Assessment/Plan: Acute on chronic systolic CHF (congestive heart failure) EF 35%: - On torsemide twice a day, Imdur add hydralazine.  - Monitor Cr, appreciate cardiology assistance. - Strict I and O's.,  good UOP. - meeting with palliative care on 9.4.2014 A fib: - EKG showed afib, cardiology considering NOAC's - Currently rate controlled.  Pleural effusion - right due to Heart failure: -pulmonary consult rec pleurex cath. - 1.3 L from pleurex cath.  CKD (chronic kidney disease) stage 4, GFR 15-29 ml/min - improving with diureses.     Code Status: full Family Communication: none  Disposition Plan: inpatient   Consultants:  Cardiology  Pulmonary  Procedures:  CT chest  Pleurex cath.  Antibiotics:  None  HPI/Subjective: She relates she feels better. Objective: Filed Vitals:   05/11/13 1209 05/11/13 1210 05/11/13 1215 05/11/13 1220  BP: 121/44 119/44 105/46 123/53  Pulse: 70 70 70 70  Temp:      TempSrc:      Resp: 15 18 21 21   Height:      Weight:      SpO2: 98% 97% 97% 98%    Intake/Output Summary (Last 24 hours) at 05/11/13 1238 Last data filed at 05/11/13 0936  Gross per 24 hour  Intake    480 ml  Output   1827 ml  Net  -1347 ml   Filed Weights   05/09/13 0510 05/10/13 0503 05/11/13 0507  Weight: 76.522 kg (168 lb 11.2 oz) 76.567 kg (168 lb 12.8 oz) 76.1 kg (167 lb 12.3 oz)    Exam:  General: Alert, awake, oriented x3, in no acute distress.  HEENT: No bruits, no goiter.  Heart: Regular rate and rhythm, without murmurs, rubs, gallops.  Lungs: Good air movement, clear to auscultation, pleurex cath in place.    Data Reviewed: Basic Metabolic Panel:  Recent Labs Lab 05/07/13 0430 05/08/13 0400 05/09/13 0420 05/10/13 0457 05/11/13 0550  NA 135 136 137 134* 136  K 4.1 4.2 4.3 4.6 4.2  CL 94* 94* 94* 92* 91*  CO2 36* 38* 39* 36* 39*  GLUCOSE 259* 219* 208* 226* 177*  BUN 56* 58* 61* 59* 57*   CREATININE 1.55* 1.63* 1.50* 1.39* 1.47*  CALCIUM 8.5 8.5 8.8 8.7 8.8  MG  --  1.8  --   --   --    Liver Function Tests:  Recent Labs Lab 05/04/13 1610  AST 42*  ALT 15  ALKPHOS 131*  BILITOT 0.4  PROT 6.2  ALBUMIN 2.5*   No results found for this basename: LIPASE, AMYLASE,  in the last 168 hours No results found for this basename: AMMONIA,  in the last 168 hours CBC:  Recent Labs Lab 05/04/13 1610 05/05/13 0600 05/06/13 0432 05/07/13 0430 05/08/13 0400  WBC 9.5 9.1 8.8 8.9 10.0  HGB 13.8 13.3 13.4 12.5 12.5  HCT 41.4 41.0 41.9 38.5 39.3  MCV 90.0 91.3 91.1 91.2 92.0  PLT 180 197 170 166 176   Cardiac Enzymes:  Recent Labs Lab 05/04/13 1610  TROPONINI <0.30   BNP (last 3 results)  Recent Labs  04/27/13 2019 05/06/13 0432 05/07/13 0430  PROBNP 6703.0* 4186.0* 4272.0*   CBG:  Recent Labs Lab 05/10/13 0557 05/10/13 1115 05/10/13 1612 05/10/13 2108 05/11/13 0617  GLUCAP 172* 252* 79 267* 164*    Recent Results (from the past 240 hour(s))  BODY FLUID CULTURE     Status: None   Collection Time    05/06/13 12:27 PM  Result Value Range Status   Specimen Description FLUID RIGHT PLEURAL   Final   Special Requests NONE   Final   Gram Stain     Final   Value: CYTOSPIN WBC PRESENT,BOTH PMN AND MONONUCLEAR     NO ORGANISMS SEEN     Performed at [facility]   Culture     Final   Value: NO GROWTH 3 DAYS     Performed at [facility]   Report Status 05/09/2013 FINAL   Final     Studies: Dg Chest 2 View  05/10/2013   CLINICAL DATA:  79 year old female with pleural effusion.  EXAM: CHEST  2 VIEW  COMPARISON:  05/08/2013 and earlier.  FINDINGS: Seated AP and lateral views of the chest. Moderate right and small left pleural effusions have not significantly changed. Ventilation at the right lung base appears mildly decreased. Stable cardiac size and mediastinal contours. No pneumothorax. Pulmonary vascularity appears decreased. No overt  edema. Stable visualized osseous structures. Left chest cardiac pacemaker.  IMPRESSION: All moderate right and small left pleural effusions not significantly changed. Mildly decreased ventilation at the right lung base. Interval decreased pulmonary vascular congestion.   Electronically Signed   By: Augusto Gamble M.D.   On: 05/10/2013 19:00    Scheduled Meds: . allopurinol  100 mg Oral Daily  . ALPRAZolam  1 mg Oral QHS  . aspirin EC  81 mg Oral QHS  . atorvastatin  40 mg Oral QHS  . feeding supplement  237 mL Oral Q24H  . heparin  5,000 Units Subcutaneous Q8H  . hydrALAZINE  10 mg Oral Q8H  . insulin aspart  0-15 Units Subcutaneous TID WC  . insulin aspart  0-5 Units Subcutaneous QHS  . isosorbide mononitrate  30 mg Oral Daily  . levothyroxine  125 mcg Oral QAC breakfast  . pantoprazole  40 mg Oral Daily  . sevelamer carbonate  800 mg Oral BID WC  . torsemide  100 mg Oral BID  . Vitamin D (Ergocalciferol)  50,000 Units Oral Q M,W,F   Continuous Infusions:    Marinda Elk  [facility] Pager [phone]. If 8PM-8AM, please contact night-coverage at [email or web address], password [facility] 05/11/2013, 12:38 PM  LOS: 7 days

## 2013-05-11 NOTE — Consult Note (Signed)
Patient Miranda Cruz      DOB: [birth year]      ZOX:[id number]     Consult Note from the Palliative Medicine Team at [facility]    Consult Requested by: Dr. David Stall     PCP: Hoyle Sauer, MD Reason for Consultation: GOc    Phone Number:[phone] Related symptom recommendations. Assessment of patients Current state:79 yr old white female with known history of ischemic cardiomyopathy, chronic systolic heart failure, stage III-IV CKD returns after recent admission with increased shortness of breath.  Patient was found to have increased pleural effusion felt to be secondary to cardiac condition.  A pleurx catheter was placed this afternoo.  I met with the patient and her two daughters.  We talked about her chronic conditions and how to cope with them.  She relates at this time her goals are to stabilize her symptom management and return home as soon as possible.  See my original summary for additional information.  She recalls facts but has a hard time equating full value to them as the impact on there life.  She was not ready to focus on goals that might involve hospice care which she is likely a candidate for.  Out of respect for her wishes we did not discuss hospice care as option at this time , although I did review this option with her daughter [name] with whom she lives. She would like to resume [name] home care but states "she does not want a lot of foot traffic" at the house.   Goals of Care: 1.  Code Status: Full code status remains one of there goals.  Her daughter [name] states that they have an advanced directive packet that they will be working through at home.   2. Scope of Treatment: At this time the patient requests continued curative treatments.  She was wondering if she needed to have her pacemaker interrogated since she was due in June but was unable to have it done. Will resume home health support at discharge.     4. Disposition: Home with Home Health.  Family  has been empowered to work with Dr. Felipa Eth to invite hospice when the time is right for the patient and family .   3. Symptom Management:   1. Anxiety/Agitation: continue prn xanax 2. Pain: She has been having reasonable relief with oxyir 3. Bowel Regimen: she tends toward constipation, dulcolax can be added pr if or medications are not effective  4. Psychosocial: Lives with her daughter [name]. Loves animals , very independent  5. Spiritual: Not a big focus but welcome         Patient Documents Completed or Given: Document Given Completed  Advanced Directives Pkt    MOST    DNR    Gone from My Sight    Hard Choices      Brief HPI: 79 yr old white female with known history of ischemic cardiomyopathy, chronic systolic heart failure, stage III-IV CKD returns after recent admission with increased shortness of breath.  We were asked to assist in goals of care.   ROS: tired, improved shortness of breath.    PMH:  Past Medical History  Diagnosis Date  . Myocardial infarction 1995  . CHF (congestive heart failure)   . Pacemaker   . Hypertension   . High cholesterol   . Pleural effusion on right 04/27/2013    [name] 04/27/2013 (04/27/2013)  . Chronic kidney disease (CKD), stage IV (severe)     /  notes 04/27/2013 (04/27/2013)  . Pneumonia 1948; 1954; 02/2013; ?04/27/2013    "real bad the first 2 times"; don't remember how bad in 02/2013; might have it now"  (04/27/2013)  . Exertional shortness of breath     "for the past week" (04/27/2013)  . Hypothyroidism   . Type II diabetes mellitus   . History of blood transfusion 2010    "w/hip replacement" (04/27/2013)  . Arthritis     "back; hands" (04/27/2013)  . Chronic lower back pain   . Gout     "several times; left ankle; fingers" (04/27/2013)     PSH: Past Surgical History  Procedure Laterality Date  . Coronary angioplasty  1995  . Coronary angioplasty with stent placement  2000    "?1" (04/27/2013)  . Abdominal wall mesh   removal  07/2007    "took the part out that was infected" (04/27/2013)  . Total hip arthroplasty Left 2009  . Total hip arthroplasty Right 2010  . Insert / replace / remove pacemaker  ~ 2011  . Hernia repair  06/2007    "umbilical hernia repair" (04/27/2013)  . Cataract extraction w/ intraocular lens implant Right 05/2012  . Joint replacement    . Thoracentesis Right 04/28/2013   I have reviewed the FH and SH and  If appropriate update it with new information. Allergies  Allergen Reactions  . Morphine And Related Other (See Comments)    Made her loopy  . Phenergan [Promethazine Hcl] Other (See Comments)    Made her crazy  . Prednisone Other (See Comments)    Confusion, bad dreams, insomnia  . Trazodone And Nefazodone Other (See Comments)    hallucinations    Scheduled Meds: . allopurinol  100 mg Oral Daily  . ALPRAZolam  1 mg Oral QHS  . aspirin EC  81 mg Oral QHS  . atorvastatin  40 mg Oral QHS  . feeding supplement  237 mL Oral Q24H  . heparin  5,000 Units Subcutaneous Q8H  . hydrALAZINE  10 mg Oral Q8H  . insulin aspart  0-15 Units Subcutaneous TID WC  . insulin aspart  0-5 Units Subcutaneous QHS  . isosorbide mononitrate  30 mg Oral Daily  . levothyroxine  125 mcg Oral QAC breakfast  . pantoprazole  40 mg Oral Daily  . sevelamer carbonate  800 mg Oral BID WC  . torsemide  100 mg Oral BID  . Vitamin D (Ergocalciferol)  50,000 Units Oral Q M,W,F   Continuous Infusions:  PRN Meds:.acetaminophen, acetaminophen, albuterol, fluticasone, levalbuterol, nitroGLYCERIN, omega-3 acid ethyl esters, ondansetron (ZOFRAN) IV, ondansetron, oxyCODONE    BP 108/44  Pulse 70  Temp(Src) 97.6 F (36.4 C) (Oral)  Resp 18  Ht 5\' 5"  (1.651 m)  Wt 76.1 kg (167 lb 12.3 oz)  BMI 27.92 kg/m2  SpO2 100%   PPS: 40%   Intake/Output Summary (Last 24 hours) at 05/11/13 1553 Last data filed at 05/11/13 1542  Gross per 24 hour  Intake    600 ml  Output   2051 ml  Net  -1451 ml   LBM: 9/4                       Physical Exam:  General: Fatigued eyes drooping as we talk but oriented and able to participate,  Slightly hard of hearing HEENT:  PERRL EOMI, anicteric, poor dentition Chest:   Decreased but clear anteriorly, new pleurx , right chest. CVS: Regular, S1, S2 no MRG Abdomen:mildly obese  soft , not tender not distended positive bowel sounds Ext: No edema Neuro:Awake , alert oriented x3 CN II-XII intact with exception of decreased hearing.  Labs: CBC    Component Value Date/Time   WBC 10.0 05/08/2013 0400   RBC 4.27 05/08/2013 0400   HGB 12.5 05/08/2013 0400   HCT 39.3 05/08/2013 0400   PLT 176 05/08/2013 0400   MCV 92.0 05/08/2013 0400   MCH 29.3 05/08/2013 0400   MCHC 31.8 05/08/2013 0400   RDW 14.4 05/08/2013 0400   LYMPHSABS 0.8 02/05/2013 1235   MONOABS 0.4 02/05/2013 1235   EOSABS 0.0 02/05/2013 1235   BASOSABS 0.0 02/05/2013 1235      CMP     Component Value Date/Time   NA 136 05/11/2013 0550   K 4.2 05/11/2013 0550   CL 91* 05/11/2013 0550   CO2 39* 05/11/2013 0550   GLUCOSE 177* 05/11/2013 0550   BUN 57* 05/11/2013 0550   CREATININE 1.47* 05/11/2013 0550   CALCIUM 8.8 05/11/2013 0550   PROT 6.2 05/04/2013 1610   ALBUMIN 2.5* 05/04/2013 1610   AST 42* 05/04/2013 1610   ALT 15 05/04/2013 1610   ALKPHOS 131* 05/04/2013 1610   BILITOT 0.4 05/04/2013 1610   GFRNONAA 32* 05/11/2013 0550   GFRAA 38* 05/11/2013 0550    Chest Xray Reviewed/Impressions  :All moderate right and small left pleural effusions not  significantly changed. Mildly decreased ventilation at the right  lung base. Interval decreased pulmonary vascular congestion.    CT scan of the Head Reviewed/Impressions:  Study is limited without contrast, but there is no specific  abnormality to account for recurrent pleural effusion. Effusions  are bilateral, but larger on the right. Dense material in  consolidated right lower lobe suggests possibility of aspiration.  There is bilateral ground glass attenuation, but it is  minimal on  the left and much more prominent on the right. This is nonspecific  fidning, but given the asymmetry, it likely represents re-expansion  pulmonary edema following thoracentesis.  Finally, there is an approximately 2cm focus of irregular hazy  opacity in the right upper lobe, entirely nonspecific.  Abnormalities such as atelectasis, focal edema/hemorrhage, or even  adenocarcinoma can appear like this, and this should receive  followup imaging to reassess.     Time In Time Out Total Time Spent with Patient Total Overall Time  205 pm 300 pm 55  min 60 min    Greater than 50%  of this time was spent counseling and coordinating care related to the above assessment and plan.   Miyuki Rzasa L. Ladona Ridgel, MD MBA The Palliative Medicine Team at [facility] Phone: [phone] Pager: [phone]

## 2013-05-11 NOTE — Progress Notes (Signed)
S/P Pleur-X Placement

## 2013-05-11 NOTE — H&P (Signed)
I personally spoke with and obtained consent from the pt's DTR Miranda Cruz after a discussion of the risks and benefits of a tunneled pleural catheter.

## 2013-05-11 NOTE — H&P (Signed)
Miranda Cruz is an 79 y.o. female.   Chief Complaint:  Progressive dyspnea and CHF with recurrent Rt pleural effusion Previous thoracentesis 8/22 and again 8/30- 1.5 L and 1.7 L No malignant cells  CXR 9/3 reveals re collecting moderate R pleural effusion Request has been made for Rt PleurX catheter placement Dr Grace Isaac has spoken to dtr this am and DO want to move forward with this procedure For comfort care  Pt with Afib- also plan to anticoagulate soon palliative care- meeting with family today at 2pm HPI: CHF; CAD/MI; pacemaker; HTN; HLD; recurrent Rt pleural eff; CKD; SOB; DM; gout  Past Medical History  Diagnosis Date  . Myocardial infarction 1995  . CHF (congestive heart failure)   . Pacemaker   . Hypertension   . High cholesterol   . Pleural effusion on right 04/27/2013    Miranda Cruz 04/27/2013 (04/27/2013)  . Chronic kidney disease (CKD), stage IV (severe)     Miranda Cruz 04/27/2013 (04/27/2013)  . Pneumonia 1948; 1954; 02/2013; ?04/27/2013    "real bad the first 2 times"; don't remember how bad in 02/2013; might have it now"  (04/27/2013)  . Exertional shortness of breath     "for the past week" (04/27/2013)  . Hypothyroidism   . Type II diabetes mellitus   . History of blood transfusion 2010    "w/hip replacement" (04/27/2013)  . Arthritis     "back; hands" (04/27/2013)  . Chronic lower back pain   . Gout     "several times; left ankle; fingers" (04/27/2013)    Past Surgical History  Procedure Laterality Date  . Coronary angioplasty  1995  . Coronary angioplasty with stent placement  2000    "?1" (04/27/2013)  . Abdominal wall mesh  removal  07/2007    "took the part out that was infected" (04/27/2013)  . Total hip arthroplasty Left 2009  . Total hip arthroplasty Right 2010  . Insert / replace / remove pacemaker  ~ 2011  . Hernia repair  06/2007    "umbilical hernia repair" (04/27/2013)  . Cataract extraction w/ intraocular lens implant Right 05/2012  . Joint replacement    .  Thoracentesis Right 04/28/2013    History reviewed. No pertinent family history. Social History:  reports that she has never smoked. She has never used smokeless tobacco. She reports that she does not drink alcohol or use illicit drugs.  Allergies:  Allergies  Allergen Reactions  . Morphine And Related Other (See Comments)    Made her loopy  . Phenergan [Promethazine Hcl] Other (See Comments)    Made her crazy  . Prednisone Other (See Comments)    Confusion, bad dreams, insomnia  . Trazodone And Nefazodone Other (See Comments)    hallucinations     Medications Prior to Admission  Medication Sig Dispense Refill  . acetaminophen (TYLENOL) 500 MG tablet Take 1,000 mg by mouth daily as needed for pain.      Marland Kitchen allopurinol (ZYLOPRIM) 100 MG tablet Take 100 mg by mouth every morning.       Marland Kitchen ALPRAZolam (XANAX) 1 MG tablet Take 1 mg by mouth at bedtime.      Marland Kitchen aspirin EC 81 MG tablet Take 81 mg by mouth at bedtime.      Marland Kitchen atorvastatin (LIPITOR) 40 MG tablet Take 40 mg by mouth at bedtime.       . carvedilol (COREG) 6.25 MG tablet Take 1 tablet (6.25 mg total) by mouth 2 (two) times daily with a meal.  60 tablet  0  . fluticasone (FLONASE) 50 MCG/ACT nasal spray Place 2 sprays into the nose 2 (two) times daily as needed for rhinitis or allergies.      . furosemide (LASIX) 80 MG tablet Take 1.5 tablets (120 mg total) by mouth 2 (two) times daily.  90 tablet  3  . glyBURIDE (DIABETA) 2.5 MG tablet Take 1.25 mg by mouth daily as needed (if sugar is over 120).      Marland Kitchen guaiFENesin (MUCINEX) 600 MG 12 hr tablet Take 1 tablet (600 mg total) by mouth 2 (two) times daily.  60 tablet  0  . isosorbide mononitrate (IMDUR) 30 MG 24 hr tablet Take 30 mg by mouth daily.      Marland Kitchen levalbuterol (XOPENEX HFA) 45 MCG/ACT inhaler Inhale 1-2 puffs into the lungs 3 (three) times daily as needed for wheezing or shortness of breath.      . levothyroxine (SYNTHROID, LEVOTHROID) 125 MCG tablet Take 125 mcg by mouth daily  before breakfast.       . Menthol, Topical Analgesic, (BENGAY EX) Apply 1 application topically daily as needed (hand pain).      Marland Kitchen omega-3 acid ethyl esters (LOVAZA) 1 G capsule Take 1 g by mouth every other day.       Marland Kitchen oxycodone (OXY-IR) 5 MG capsule Take 2 capsules (10 mg total) by mouth 3 (three) times daily as needed for pain.  30 capsule  0  . pantoprazole (PROTONIX) 40 MG tablet Take 40 mg by mouth daily.      . Pseudoeph-Doxylamine-DM-APAP (NYQUIL PO) Take by mouth at bedtime as needed (cough). Small capful      . Pseudoephedrine-APAP-DM (DAYQUIL PO) Take by mouth daily as needed (cough). Small capful      . sevelamer carbonate (RENVELA) 800 MG tablet Take 800 mg by mouth 2 (two) times daily. Breakfast and dinner      . Vitamin D, Ergocalciferol, (DRISDOL) 50000 UNITS CAPS Take 50,000 Units by mouth 3 (three) times a week. Approximately every other day      . nitroGLYCERIN (NITROSTAT) 0.4 MG SL tablet Place 0.4 mg under the tongue as needed for chest pain. x3 doses as needed for chest pain        Results for orders placed during the hospital encounter of 05/04/13 (from the past 48 hour(s))  GLUCOSE, CAPILLARY     Status: Abnormal   Collection Time    05/09/13  4:56 PM      Result Value Range   Glucose-Capillary 249 (*) 70 - 99 mg/dL  HEMOGLOBIN Z6X     Status: Abnormal   Collection Time    05/09/13  6:15 PM      Result Value Range   Hemoglobin A1C 7.6 (*) <5.7 %   Comment: (NOTE)                                                                               According to the ADA Clinical Practice Recommendations for 2011, when     HbA1c is used as a screening test:      >=6.5%   Diagnostic of Diabetes Mellitus               (  if abnormal result is confirmed)     5.7-6.4%   Increased risk of developing Diabetes Mellitus     References:Diagnosis and Classification of Diabetes Mellitus,Diabetes     Care,2011,34(Suppl 1):S62-S69 and Standards of Medical Care in             Diabetes  - 2011,Diabetes Care,2011,34 (Suppl 1):S11-S61.   Mean Plasma Glucose 171 (*) <117 mg/dL   Comment: Performed at [facility]  GLUCOSE, CAPILLARY     Status: Abnormal   Collection Time    05/09/13  9:24 PM      Result Value Range   Glucose-Capillary 218 (*) 70 - 99 mg/dL  BASIC METABOLIC PANEL     Status: Abnormal   Collection Time    05/10/13  4:57 AM      Result Value Range   Sodium 134 (*) 135 - 145 mEq/L   Potassium 4.6  3.5 - 5.1 mEq/L   Chloride 92 (*) 96 - 112 mEq/L   CO2 36 (*) 19 - 32 mEq/L   Glucose, Bld 226 (*) 70 - 99 mg/dL   BUN 59 (*) 6 - 23 mg/dL   Creatinine, Ser 1.61 (*) 0.50 - 1.10 mg/dL   Calcium 8.7  8.4 - 09.6 mg/dL   GFR calc non Af Amer 35 (*) >90 mL/min   GFR calc Af Amer 40 (*) >90 mL/min   Comment: (NOTE)     The eGFR has been calculated using the CKD EPI equation.     This calculation has not been validated in all clinical situations.     eGFR's persistently <90 mL/min signify possible Chronic Kidney     Disease.  GLUCOSE, CAPILLARY     Status: Abnormal   Collection Time    05/10/13  5:57 AM      Result Value Range   Glucose-Capillary 172 (*) 70 - 99 mg/dL  GLUCOSE, CAPILLARY     Status: Abnormal   Collection Time    05/10/13 11:15 AM      Result Value Range   Glucose-Capillary 252 (*) 70 - 99 mg/dL  GLUCOSE, CAPILLARY     Status: None   Collection Time    05/10/13  4:12 PM      Result Value Range   Glucose-Capillary 79  70 - 99 mg/dL   Comment 1 Notify RN    GLUCOSE, CAPILLARY     Status: Abnormal   Collection Time    05/10/13  9:08 PM      Result Value Range   Glucose-Capillary 267 (*) 70 - 99 mg/dL  BASIC METABOLIC PANEL     Status: Abnormal   Collection Time    05/11/13  5:50 AM      Result Value Range   Sodium 136  135 - 145 mEq/L   Potassium 4.2  3.5 - 5.1 mEq/L   Chloride 91 (*) 96 - 112 mEq/L   CO2 39 (*) 19 - 32 mEq/L   Glucose, Bld 177 (*) 70 - 99 mg/dL   BUN 57 (*) 6 - 23 mg/dL   Creatinine, Ser 0.45 (*) 0.50 -  1.10 mg/dL   Calcium 8.8  8.4 - 40.9 mg/dL   GFR calc non Af Amer 32 (*) >90 mL/min   GFR calc Af Amer 38 (*) >90 mL/min   Comment: (NOTE)     The eGFR has been calculated using the CKD EPI equation.     This calculation has not been validated in all clinical situations.  eGFR's persistently <90 mL/min signify possible Chronic Kidney     Disease.  GLUCOSE, CAPILLARY     Status: Abnormal   Collection Time    05/11/13  6:17 AM      Result Value Range   Glucose-Capillary 164 (*) 70 - 99 mg/dL   Comment 1 Documented in Chart     Comment 2 Notify RN     Dg Chest 2 View  05/10/2013   CLINICAL DATA:  79 year old female with pleural effusion.  EXAM: CHEST  2 VIEW  COMPARISON:  05/08/2013 and earlier.  FINDINGS: Seated AP and lateral views of the chest. Moderate right and small left pleural effusions have not significantly changed. Ventilation at the right lung base appears mildly decreased. Stable cardiac size and mediastinal contours. No pneumothorax. Pulmonary vascularity appears decreased. No overt edema. Stable visualized osseous structures. Left chest cardiac pacemaker.  IMPRESSION: All moderate right and small left pleural effusions not significantly changed. Mildly decreased ventilation at the right lung base. Interval decreased pulmonary vascular congestion.   Electronically Signed   By: Augusto Gamble M.D.   On: 05/10/2013 19:00    Review of Systems  Constitutional: Negative for fever.  Respiratory: Positive for shortness of breath.   Cardiovascular: Positive for orthopnea. Negative for chest pain.  Gastrointestinal: Negative for nausea and vomiting.  Neurological: Positive for weakness.    Blood pressure 117/51, pulse 70, temperature 97.9 F (36.6 C), temperature source Oral, resp. rate 18, height 5\' 5"  (1.651 m), weight 167 lb 12.3 oz (76.1 kg), SpO2 97.00%. Physical Exam  Constitutional: She is oriented to person, place, and time. She appears well-nourished.  Cardiovascular: Normal  rate.   No murmur heard. irreg  Respiratory: Effort normal. She has wheezes. She has rales.  GI: Soft. Bowel sounds are normal. There is no tenderness.  Musculoskeletal: Normal range of motion.  weak  Neurological: She is alert and oriented to person, place, and time. Coordination normal.  Skin: Skin is warm and dry.  Psychiatric: She has a normal mood and affect. Her behavior is normal. Judgment and thought content normal.     Assessment/Plan Dyspnea; CHF; recurrent Rt pleural effusion 2 previous thoras 1.5 and 1.7 liters - neg for malignancy Re accumulation now Request has been made for Rt PleurX catheter placement for comfort CXR 9/3 does show moderate effusion Dr Grace Isaac has spoken to pts dtr and has written consent I spoke to pt myself and she has good understanding of procedure risks and benefits Agreeable to move forward Palliative care meeting today with family planned    Miranda Cruz A 05/11/2013, 8:42 AM

## 2013-05-11 NOTE — Progress Notes (Signed)
PCCM Brief Note  Chart reviewed. See that she will get pleurex catheter today, is having diuretics adjusted. Agree with pleurex, especially if we are transitioning towards a more palliative mode of care.   Please call if w can help in any way.   Levy Pupa, MD, PhD 05/11/2013, 12:22 PM [facility] [phone] or if no answer [phone]

## 2013-05-11 NOTE — Consult Note (Signed)
Patient Miranda Cruz      DOB: 09-18-1929      ZOX:[id number]  Summary of Goals of Care; full note to follow:   Met with patient and two daughters- [name] and [name].  Patient has 5 children total  Patient has good recall of facts surrounding her illness but poor understanding of the implications of her chronic illness.  We started talking about the fact that while we can control her symptoms with medicine and to some degree the pleurex catheter, we can no cure her heart.  She was not quite ready to talk about steps beyond palliative care to support her health, namely hospice.  We, therefore affirmed that her wishes are Full Code status-  I asked her to talk with the girls about this since we know that her heart is in a different place than the last time she made this choice and she agreed to work with [name] on her advanced directives packet and relook at her Code Status. I gave ample opportunity to ask questions and discuss further .  She has just returned from having her pleurx placed and so she is somewhat fatigued.  I spoke with her daughter [name] separately who stated she is much more realistic about where her mother is in her life.  She was comforted to hear that hospice could be of help in the future.  I encouraged her to consider there assistance soon rather than later as they could help with her symptom management and actually improve her quality of life.  They would like to have [name] come back and assist them at discharge, as they have worked with them before.   She asked two questions that I will convey to Dr. Anne Fu-  Should she resume her Niaspan. She has stopped taking it due to flushing that is not help with the low dose aspirin that she was switched to recently.  She was also due for pace maker interrogation in June but was in the hospital and so this was not done.  Could we do it while in the hospital.  Recommend:  1.  Full code status to remain.  Laid the preliminary ground  work for further discussions with trusted practitioners like cardiology. Family has Advanced directives packet to work through at their leisure  2. Continue curative treatments with an eye on comfort.   3.  Arrange Home Health ( had [place] before).   Total time 205- 300pm   [name], MD MBA The Palliative Medicine Team at [facility] Phone: [phone] Pager: [phone]

## 2013-05-11 NOTE — Progress Notes (Signed)
Subjective:  Bright and happy this am. Feels better. NPO   Objective:  Vital Signs in the last 24 hours: Temp:  [97.7 F (36.5 C)-98.1 F (36.7 C)] 97.9 F (36.6 C) (09/04 0507) Pulse Rate:  [70-72] 70 (09/04 0908) Resp:  [18-19] 18 (09/04 0507) BP: (110-141)/(45-90) 141/90 mmHg (09/04 0908) SpO2:  [96 %-97 %] 97 % (09/04 0507) Weight:  [76.1 kg (167 lb 12.3 oz)] 76.1 kg (167 lb 12.3 oz) (09/04 0507)  Intake/Output from previous day: 09/03 0701 - 09/04 0700 In: 720 [P.O.:720] Out: 1852 [Urine:1850; Stool:2]   Physical Exam: General: Well developed, well nourished, in no acute distress. Extremities: No clubbing or cyanosis. No edema. Neurologic: Alert and oriented x 3.    Lab Results: No results found for this basename: WBC, HGB, PLT,  in the last 72 hours  Recent Labs  05/10/13 0457 05/11/13 0550  NA 134* 136  K 4.6 4.2  CL 92* 91*  CO2 36* 39*  GLUCOSE 226* 177*  BUN 59* 57*  CREATININE 1.39* 1.47*  Imaging: Dg Chest 2 View  05/10/2013   CLINICAL DATA:  79 year old female with pleural effusion.  EXAM: CHEST  2 VIEW  COMPARISON:  05/08/2013 and earlier.  FINDINGS: Seated AP and lateral views of the chest. Moderate right and small left pleural effusions have not significantly changed. Ventilation at the right lung base appears mildly decreased. Stable cardiac size and mediastinal contours. No pneumothorax. Pulmonary vascularity appears decreased. No overt edema. Stable visualized osseous structures. Left chest cardiac pacemaker.  IMPRESSION: All moderate right and small left pleural effusions not significantly changed. Mildly decreased ventilation at the right lung base. Interval decreased pulmonary vascular congestion.   Electronically Signed   By: Augusto Gamble M.D.   On: 05/10/2013 19:00   Personally viewed.   Telemetry: AFIB Personally viewed.   Assessment/Plan:  Principal Problem:   Acute on chronic systolic CHF (congestive heart failure) Active Problems:   CKD  (chronic kidney disease) stage 4, GFR 15-29 ml/min   Cardiomyopathy, ischemic   CAD (coronary artery disease)   Encephalopathy acute   Pleural effusion - right   Cardiac pacemaker in situ   -catheter placement today, reviewed notes -Tomorrow likely start NOAC for AFIB -Torsemide increased 9/3 to 100mg  BID -Watch creat 1.47   SKAINS, MARK 05/11/2013, 9:10 AM

## 2013-05-12 DIAGNOSIS — Z515 Encounter for palliative care: Secondary | ICD-10-CM

## 2013-05-12 DIAGNOSIS — K5900 Constipation, unspecified: Secondary | ICD-10-CM

## 2013-05-12 LAB — BASIC METABOLIC PANEL
BUN: 57 mg/dL — ABNORMAL HIGH (ref 6–23)
Chloride: 90 mEq/L — ABNORMAL LOW (ref 96–112)
GFR calc Af Amer: 33 mL/min — ABNORMAL LOW (ref >90)
GFR calc non Af Amer: 28 mL/min — ABNORMAL LOW (ref >90)
Potassium: 4.4 mEq/L (ref 3.5–5.1)
Sodium: 136 mEq/L (ref 135–145)

## 2013-05-12 LAB — GLUCOSE, CAPILLARY
Glucose-Capillary: 222 mg/dL — ABNORMAL HIGH (ref 70–99)
Glucose-Capillary: 302 mg/dL — ABNORMAL HIGH (ref 70–99)

## 2013-05-12 MED ORDER — HYDRALAZINE HCL 25 MG PO TABS
25.0000 mg | ORAL_TABLET | Freq: Three times a day (TID) | ORAL | Status: DC
  Administered 2013-05-12 – 2013-05-13 (×4): 25 mg via ORAL
  Filled 2013-05-12 (×9): qty 1

## 2013-05-12 MED ORDER — BISACODYL 10 MG RE SUPP
10.0000 mg | Freq: Every day | RECTAL | Status: DC | PRN
  Administered 2013-05-12: 18:00:00 10 mg via RECTAL
  Filled 2013-05-12: qty 1

## 2013-05-12 MED ORDER — POLYETHYLENE GLYCOL 3350 17 G PO PACK
17.0000 g | PACK | Freq: Every day | ORAL | Status: DC
  Administered 2013-05-12 – 2013-05-15 (×4): 17 g via ORAL
  Filled 2013-05-12 (×4): qty 1

## 2013-05-12 NOTE — Progress Notes (Signed)
Physical Therapy Treatment Patient Details Name: Miranda Cruz MRN: [id number] DOB: 01/05/1930 Today's Date: 05/12/2013 Time: 1308-6578 PT Time Calculation (min): 39 min  PT Assessment / Plan / Recommendation  History of Present Illness 79 y.o. female with a Past Medical History of chronic systolic heart failure, ischemic cardiomyopathy, stage III-IV chronic kidney disease who presents today with the above noted complaint. Patient just was discharged from the hospital a few days ago, she claims upon discharge she was doing well, and felt that she was back to her baseline. This morning, patient fell that she was much more short of breath, and felt that if she did not seek medical attention she would get much worse. She went to her primary care practitioner's office, where a chest x-ray apparently demonstrated worsening of the pleural effusion, she was then referred to the hospitalist service as a direct admission. Pt had pleur  x catheter placed on 05/11/13.   PT Comments   Pt making good progress.  Follow Up Recommendations  Home health PT;Supervision - Intermittent     Does the patient have the potential to tolerate intense rehabilitation     Barriers to Discharge        Equipment Recommendations  None recommended by PT    Recommendations for Other Services    Frequency Min 3X/week   Progress towards PT Goals Progress towards PT goals: Progressing toward goals  Plan Discharge plan needs to be updated    Precautions / Restrictions Precautions Precautions: Fall   Pertinent Vitals/Pain See flow sheet.    Mobility  Bed Mobility Bed Mobility: Sit to Supine Sit to Supine: 4: Min assist;HOB flat Details for Bed Mobility Assistance: assist to bring legs up Transfers Sit to Stand: 5: Supervision;With upper extremity assist;With armrests;From chair/3-in-1 Stand to Sit: 5: Supervision;With upper extremity assist;With armrests;To bed;To chair/3-in-1 Ambulation/Gait Ambulation/Gait Assistance:  5: Supervision Ambulation Distance (Feet): 350 Feet Assistive device: Rolling walker Ambulation/Gait Assistance Details: Slow but steady. Gait Pattern: Step-through pattern;Decreased stride length Gait velocity: slow    Exercises     PT Diagnosis:    PT Problem List:   PT Treatment Interventions:     PT Goals (current goals can now be found in the care plan section)    Visit Information  Last PT Received On: 05/12/13 Assistance Needed: +1 History of Present Illness: 79 y.o. female with a Past Medical History of chronic systolic heart failure, ischemic cardiomyopathy, stage III-IV chronic kidney disease who presents today with the above noted complaint. Patient just was discharged from the hospital a few days ago, she claims upon discharge she was doing well, and felt that she was back to her baseline. This morning, patient fell that she was much more short of breath, and felt that if she did not seek medical attention she would get much worse. She went to her primary care practitioner's office, where a chest x-ray apparently demonstrated worsening of the pleural effusion, she was then referred to the hospitalist service as a direct admission. Pt had pleur  x catheter placed on 05/11/13.    Subjective Data      Cognition  Cognition Arousal/Alertness: Awake/alert Behavior During Therapy: WFL for tasks assessed/performed Overall Cognitive Status: Within Functional Limits for tasks assessed    Balance  Balance Balance Assessed: Yes Static Standing Balance Static Standing - Balance Support: No upper extremity supported Static Standing - Level of Assistance: 6: Modified independent (Device/Increase time)  End of Session PT - End of Session Activity Tolerance: Patient tolerated  treatment well Patient left: in bed;with call bell/phone within reach Nurse Communication: Mobility status;Other (comment) (SaO2)   GP     Thomson Herbers 05/12/2013, 11:16 AM  Skip Mayer PT [phone]

## 2013-05-12 NOTE — Progress Notes (Signed)
Subjective:  Sitting up. Mild discomfort over night. Improved diuresis.   Objective:  Vital Signs in the last 24 hours: Temp:  [97.6 F (36.4 C)-98.7 F (37.1 C)] 97.8 F (36.6 C) (09/05 0529) Pulse Rate:  [70-71] 70 (09/05 0529) Resp:  [15-23] 18 (09/05 0529) BP: (103-141)/(44-90) 124/61 mmHg (09/05 0529) SpO2:  [97 %-100 %] 97 % (09/05 0529) Weight:  [75.479 kg (166 lb 6.4 oz)] 75.479 kg (166 lb 6.4 oz) (09/05 0529)  Intake/Output from previous day: 09/04 0701 - 09/05 0700 In: 600 [P.O.:600] Out: 1900 [Urine:1900]   Physical Exam: General: Well developed, well nourished, in no acute distress. Head:  Normocephalic and atraumatic. Lungs: Catheter in place. Heart: Normal S1 and S2.  No murmur, rubs or gallops.  Abdomen: soft, non-tender, positive bowel sounds. Extremities: No clubbing or cyanosis. trace edema. Neurologic: Alert and oriented x 3.    Lab Results: No results found for this basename: WBC, HGB, PLT,  in the last 72 hours  Recent Labs  05/11/13 0550 05/12/13 0542  NA 136 136  K 4.2 4.4  CL 91* 90*  CO2 39* 39*  GLUCOSE 177* 197*  BUN 57* 57*  CREATININE 1.47* 1.65*   No results found for this basename: TROPONINI, CK, MB,  in the last 72 hours Hepatic Function Panel No results found for this basename: PROT, ALBUMIN, AST, ALT, ALKPHOS, BILITOT, BILIDIR, IBILI,  in the last 72 hours No results found for this basename: CHOL,  in the last 72 hours No results found for this basename: PROTIME,  in the last 72 hours  Imaging: Dg Chest 2 View  05/10/2013   CLINICAL DATA:  79 year old female with pleural effusion.  EXAM: CHEST  2 VIEW  COMPARISON:  05/08/2013 and earlier.  FINDINGS: Seated AP and lateral views of the chest. Moderate right and small left pleural effusions have not significantly changed. Ventilation at the right lung base appears mildly decreased. Stable cardiac size and mediastinal contours. No pneumothorax. Pulmonary vascularity appears decreased.  No overt edema. Stable visualized osseous structures. Left chest cardiac pacemaker.  IMPRESSION: All moderate right and small left pleural effusions not significantly changed. Mildly decreased ventilation at the right lung base. Interval decreased pulmonary vascular congestion.   Electronically Signed   By: Augusto Gamble M.D.   On: 05/10/2013 19:00   Ir Perc Pleural Drain W/indwell Cath W/img Guide  05/11/2013   *RADIOLOGY REPORT*  Clinical Data:  Recurrent symptomatic right-sided pleural effusion in patient with history of recurrent CHF, now seeking palliative therapy  INSERTION OF TUNNELED RIGHT SIDED PLEURAL DRAINAGE CATHETER  Comparison:  Ultrasound guided thoracentesis - 04/28/2013; chest radiograph - 05/10/2013; 05/08/2013; chest CT - 05/06/2013  Intravenous medications: Versed 2 mg IV; Fentanyl 75 mcg IV; Ancef 2 gram IV; Antibiotic was administered in an appropriate time interval for the procedure.  Total Moderate Sedation Time: 15 minutes.  Fluoroscopy time:  36 seconds  Complications:  None immediate  Procedure:  The procedure, risks, benefits, and alternatives were explained to the patient and the patient's daughter, who wish to proceed with the placement of this permanent pleural catheter as the patient is seeking palliative care.  The patient and the patient's wife understand and consent to the procedure.  The right lateral chest and right upper abdomen were prepped with Chlorhexidine in a sterile fashion, and a sterile drape was applied covering the operative field.  A sterile gown and sterile gloves were used for the procedure.  Initial ultrasound scanning and fluoroscopic imaging demonstrates  a recurrent moderate to large right-sided pleural effusion.  Under direct ultrasound guidance, the right inferior lateral pleural space was accessed with an 19 gauge trocar needle after the overlying soft tissues were anesthetized with 1% lidocaine with epinephrine. An Amplatz superstiff wire was then advanced  under fluoroscopy into the pleural space.  A 16 French tunneled pleural catheter was tunneled from an incision within the right upper abdominal quadrant to the access site. The pleural access site was serially dilated under fluoroscopy, ultimately allowing placement of a peel-away sheath.  The catheter was advanced through the peel-away sheath.  The sheath was then removed.  Final catheter positioning was confirmed with a fluoroscopic radiographic image.  The access incision was closed with subcutaneous subcuticular 4-0 Vicryl, Dermabond and Steri-Strips.  A Prolene retention suture was applied at the catheter exit site.  Large volume thoracentesis was performed through the new catheter utilizing provided bulb vacuum assisted drainage bag.  The patient tolerated the above procedure well without immediate postprocedural complication.  Findings:  The catheter was placed via the right lateral chest wall.  Catheter course is towards the apex.  Approximately 1.3 liters of serous pleural fluid was able to be removed after catheter placement.  IMPRESSION:  Successful placement of permanent, tunneled right pleural drainage catheter via lateral approach.  Approximately 1.3 liters of serous pleural fluid was removed after catheter placement.   Original Report Authenticated By: Tacey Ruiz, MD   Personally viewed.   Telemetry: AFIB, V paced Personally viewed.    Assessment/Plan:  Principal Problem:   Acute on chronic systolic CHF (congestive heart failure) Active Problems:   CKD (chronic kidney disease) stage 4, GFR 15-29 ml/min   Cardiomyopathy, ischemic   CAD (coronary artery disease)   Encephalopathy acute   Pleural effusion - right   Cardiac pacemaker in situ   - Cath in lung  - Continue torsemide 100mg  PO BID  - Creat stable (was 1.8 on admit)  - Pacer   - Fluid restrict  - Start Eliquis 2.5mg  BID - risks of bleeding discussed.   - OK to stop Niacin (flushing)  - Reviewed palliative care note.    SKAINS, MARK 05/12/2013, 8:59 AM

## 2013-05-12 NOTE — Progress Notes (Signed)
Inpatient Diabetes Program Recommendations  AACE/ADA: New Consensus Statement on Inpatient Glycemic Control (2013)  Target Ranges:  Prepandial:   less than 140 mg/dL      Peak postprandial:   less than 180 mg/dL (1-2 hours)      Critically ill patients:  140 - 180 mg/dL   Results for KAILY, WRAGG (MRN [id number]) as of 05/12/2013 10:26  Ref. Range 05/11/2013 06:17 05/11/2013 12:51 05/11/2013 16:19 05/11/2013 21:25 05/12/2013 06:32  Glucose-Capillary Latest Range: 70-99 mg/dL 409 (H) 811 (H) 914 (H) 272 (H) 222 (H)    Inpatient Diabetes Program Recommendations Insulin - Basal: Please consider ordering low dose basal insulin; recommend starting with Levemir 5 units daily and adjust accordingly.   Note: Blood glucose over the past 24 hours has ranged from 153-272 mg/dl.  Patient was NPO yesterday morning until 15:00 at which time patient ate 50% of meal.  Fasting blood glucose this morning noted to be 222 mg/dl.  Please consider ordering low dose basal insulin; recommend starting with Levemir 5 units daily and adjust accordingly to improve inpatient glycemic control.  Will continue to follow.  Thanks, Orlando Penner, RN, MSN, CCRN Diabetes Coordinator Inpatient Diabetes Program [phone]

## 2013-05-12 NOTE — Progress Notes (Signed)
05/11/13 Patient is A/Ox4 and is hard of hearing. She is 1 person assistance to the bedside commode. She has a dressing to her right lower back which is clean, dry, and intact. She had some c/o pain during the shift and prn pain medication was given. Pt.is currently on 3 L [place] of oxygen, she is home O2 dependant.

## 2013-05-12 NOTE — Progress Notes (Signed)
[facility] PROGRESS NOTE  Assessment/Plan: Acute on chronic systolic CHF (congestive heart failure) EF 35%: - On torsemide twice a day, Imdur add hydralazine.  - Monitor Cr, appreciate cardiology assistance. - Strict I and O's.,  good UOP. - meeting with palliative care on 9.4.2014. - dry weight possible 73. Kg. 9.5.2014 75. Kg.  A fib: - EKG showed afib,starting eloquis. - Currently rate controlled.  Pleural effusion - right due to Heart failure: - pulmonary consult rec pleurex cath. - 1.3 L from pleurex cath. - IR to provide instructions.  CKD (chronic kidney disease) stage 4, GFR 15-29 ml/min - baseline 1.8, cont lasix.    Code Status: full Family Communication: none  Disposition Plan: inpatient   Consultants:  Cardiology  Pulmonary  Procedures:  CT chest  Pleurex cath.  Antibiotics:  None  HPI/Subjective: She relates she feels better. Objective: Filed Vitals:   05/11/13 1500 05/11/13 1716 05/11/13 2049 05/12/13 0529  BP: 108/44 109/45 118/48 124/61  Pulse: 70 70 70 70  Temp:   98.7 F (37.1 C) 97.8 F (36.6 C)  TempSrc:   Oral Oral  Resp:   18 18  Height:      Weight:    75.479 kg (166 lb 6.4 oz)  SpO2:   97% 97%    Intake/Output Summary (Last 24 hours) at 05/12/13 1022 Last data filed at 05/12/13 0853  Gross per 24 hour  Intake    840 ml  Output   1100 ml  Net   -260 ml   Filed Weights   05/10/13 0503 05/11/13 0507 05/12/13 0529  Weight: 76.567 kg (168 lb 12.8 oz) 76.1 kg (167 lb 12.3 oz) 75.479 kg (166 lb 6.4 oz)    Exam:  General: Alert, awake, oriented x3, in no acute distress.  HEENT: No bruits, no goiter.  Heart: Regular rate and rhythm, without murmurs, rubs, gallops.  Lungs: Good air movement, clear to auscultation, pleurex cath in place.    Data Reviewed: Basic Metabolic Panel:  Recent Labs Lab 05/08/13 0400 05/09/13 0420 05/10/13 0457 05/11/13 0550 05/12/13 0542  NA 136 137 134* 136 136  K 4.2 4.3 4.6  4.2 4.4  CL 94* 94* 92* 91* 90*  CO2 38* 39* 36* 39* 39*  GLUCOSE 219* 208* 226* 177* 197*  BUN 58* 61* 59* 57* 57*  CREATININE 1.63* 1.50* 1.39* 1.47* 1.65*  CALCIUM 8.5 8.8 8.7 8.8 8.3*  MG 1.8  --   --   --   --    Liver Function Tests: No results found for this basename: AST, ALT, ALKPHOS, BILITOT, PROT, ALBUMIN,  in the last 168 hours No results found for this basename: LIPASE, AMYLASE,  in the last 168 hours No results found for this basename: AMMONIA,  in the last 168 hours CBC:  Recent Labs Lab 05/06/13 0432 05/07/13 0430 05/08/13 0400  WBC 8.8 8.9 10.0  HGB 13.4 12.5 12.5  HCT 41.9 38.5 39.3  MCV 91.1 91.2 92.0  PLT 170 166 176   Cardiac Enzymes: No results found for this basename: CKTOTAL, CKMB, CKMBINDEX, TROPONINI,  in the last 168 hours BNP (last 3 results)  Recent Labs  04/27/13 2019 05/06/13 0432 05/07/13 0430  PROBNP 6703.0* 4186.0* 4272.0*   CBG:  Recent Labs Lab 05/11/13 0617 05/11/13 1251 05/11/13 1619 05/11/13 2125 05/12/13 0632  GLUCAP 164* 153* 258* 272* 222*    Recent Results (from the past 240 hour(s))  BODY FLUID CULTURE     Status: None  Collection Time    05/06/13 12:27 PM      Result Value Range Status   Specimen Description FLUID RIGHT PLEURAL   Final   Special Requests NONE   Final   Gram Stain     Final   Value: CYTOSPIN WBC PRESENT,BOTH PMN AND MONONUCLEAR     NO ORGANISMS SEEN     Performed at [facility]   Culture     Final   Value: NO GROWTH 3 DAYS     Performed at [facility]   Report Status 05/09/2013 FINAL   Final     Studies: Dg Chest 2 View  05/10/2013   CLINICAL DATA:  79 year old female with pleural effusion.  EXAM: CHEST  2 VIEW  COMPARISON:  05/08/2013 and earlier.  FINDINGS: Seated AP and lateral views of the chest. Moderate right and small left pleural effusions have not significantly changed. Ventilation at the right lung base appears mildly decreased. Stable cardiac size and  mediastinal contours. No pneumothorax. Pulmonary vascularity appears decreased. No overt edema. Stable visualized osseous structures. Left chest cardiac pacemaker.  IMPRESSION: All moderate right and small left pleural effusions not significantly changed. Mildly decreased ventilation at the right lung base. Interval decreased pulmonary vascular congestion.   Electronically Signed   By: Augusto Gamble M.D.   On: 05/10/2013 19:00   Ir Perc Pleural Drain W/indwell Cath W/img Guide  05/11/2013   *RADIOLOGY REPORT*  Clinical Data:  Recurrent symptomatic right-sided pleural effusion in patient with history of recurrent CHF, now seeking palliative therapy  INSERTION OF TUNNELED RIGHT SIDED PLEURAL DRAINAGE CATHETER  Comparison:  Ultrasound guided thoracentesis - 04/28/2013; chest radiograph - 05/10/2013; 05/08/2013; chest CT - 05/06/2013  Intravenous medications: Versed 2 mg IV; Fentanyl 75 mcg IV; Ancef 2 gram IV; Antibiotic was administered in an appropriate time interval for the procedure.  Total Moderate Sedation Time: 15 minutes.  Fluoroscopy time:  36 seconds  Complications:  None immediate  Procedure:  The procedure, risks, benefits, and alternatives were explained to the patient and the patient's daughter, who wish to proceed with the placement of this permanent pleural catheter as the patient is seeking palliative care.  The patient and the patient's wife understand and consent to the procedure.  The right lateral chest and right upper abdomen were prepped with Chlorhexidine in a sterile fashion, and a sterile drape was applied covering the operative field.  A sterile gown and sterile gloves were used for the procedure.  Initial ultrasound scanning and fluoroscopic imaging demonstrates a recurrent moderate to large right-sided pleural effusion.  Under direct ultrasound guidance, the right inferior lateral pleural space was accessed with an 19 gauge trocar needle after the overlying soft tissues were anesthetized with  1% lidocaine with epinephrine. An Amplatz superstiff wire was then advanced under fluoroscopy into the pleural space.  A 16 French tunneled pleural catheter was tunneled from an incision within the right upper abdominal quadrant to the access site. The pleural access site was serially dilated under fluoroscopy, ultimately allowing placement of a peel-away sheath.  The catheter was advanced through the peel-away sheath.  The sheath was then removed.  Final catheter positioning was confirmed with a fluoroscopic radiographic image.  The access incision was closed with subcutaneous subcuticular 4-0 Vicryl, Dermabond and Steri-Strips.  A Prolene retention suture was applied at the catheter exit site.  Large volume thoracentesis was performed through the new catheter utilizing provided bulb vacuum assisted drainage bag.  The patient tolerated the above  procedure well without immediate postprocedural complication.  Findings:  The catheter was placed via the right lateral chest wall.  Catheter course is towards the apex.  Approximately 1.3 liters of serous pleural fluid was able to be removed after catheter placement.  IMPRESSION:  Successful placement of permanent, tunneled right pleural drainage catheter via lateral approach.  Approximately 1.3 liters of serous pleural fluid was removed after catheter placement.   Original Report Authenticated By: Tacey Ruiz, MD    Scheduled Meds: . allopurinol  100 mg Oral Daily  . ALPRAZolam  1 mg Oral QHS  . aspirin EC  81 mg Oral QHS  . atorvastatin  40 mg Oral QHS  . feeding supplement  237 mL Oral Q24H  . heparin  5,000 Units Subcutaneous Q8H  . hydrALAZINE  10 mg Oral Q8H  . insulin aspart  0-15 Units Subcutaneous TID WC  . insulin aspart  0-5 Units Subcutaneous QHS  . isosorbide mononitrate  30 mg Oral Daily  . levothyroxine  125 mcg Oral QAC breakfast  . pantoprazole  40 mg Oral Daily  . sevelamer carbonate  800 mg Oral BID WC  . torsemide  100 mg Oral BID  .  Vitamin D (Ergocalciferol)  50,000 Units Oral Q M,W,F   Continuous Infusions:    Marinda Elk  [facility] Pager [phone]. If 8PM-8AM, please contact night-coverage at [email or web address], password [facility] 05/12/2013, 10:22 AM  LOS: 8 days

## 2013-05-12 NOTE — Progress Notes (Signed)
Patient Miranda Cruz      DOB: 13-Sep-1929      ZOX:[id number]   Palliative Medicine Team at [facility] Progress Note    Subjective:  Haylin states she had a better day today.  She relates she was able to walk down the hall.  She states she is having trouble with constipation.   Filed Vitals:   05/12/13 2013  BP: 130/56  Pulse: 73  Temp: 98.3 F (36.8 C)  Resp: 18   Physical exam:  General: no acute distress, better color in her cheeks PERRL, EOMI, anicteric , poor dentition Chest: decreased no wheezing CVS: irregular rate and rhythm S1, S2 Abd: obese soft not tender not distended Neuro: hard of hearing,  Oriented to time place and person looking forward to getting home  Lab Results  Component Value Date   CREATININE 1.65* 05/12/2013   BUN 57* 05/12/2013   NA 136 05/12/2013   K 4.4 05/12/2013   CL 90* 05/12/2013   CO2 39* 05/12/2013  ; Lab Results  Component Value Date   WBC 10.0 05/08/2013   HGB 12.5 05/08/2013   HCT 39.3 05/08/2013   MCV 92.0 05/08/2013   PLT 176 05/08/2013   Assessment and plan: 79 yr old white female with acute on chronic exacerbation of heart failure.  S/p pleurx catheter.  Patient desires to return to home with home health services. Plan to maximize curative treatments.  1.  Full code.  Family has advance directive packets and will be talking things over amongst himself.  2.  Constipation:  Add dulcolax suppository  3. Home health services at discharge.  Noted Dr. Anne Fu' note to dc niaspan.   Total time 25 min  Zurri Rudden L. Ladona Ridgel, MD MBA The Palliative Medicine Team at [facility] Phone: [phone] Pager: [phone]

## 2013-05-12 NOTE — Progress Notes (Signed)
NUTRITION FOLLOW-UP  INTERVENTION: Continue Glucerna Shake po daily, each supplement provides 220 kcal and 10 grams of protein. RD to continue to follow nutrition care plan.  NUTRITION DIAGNOSIS: Inadequate oral intake related to poor dentition as evidenced by dietary recall and recent non-significant weight loss. Improving.  Goal: Intake to meet >90% of estimated nutrition needs. Met.  Monitor:  weight trends, lab trends, I/O's, PO intake, supplement tolerance  ASSESSMENT: PMHx significant for CHF, ischemic cardiomyopathy, stage III-IV CKD. Patient with a recent hospitalization, discharged on 8/26 at which time she underwent ultrasound-guided thoracentesis, yielding 1.5 L of yellow fluid. Admitted with increasing SOB. Work-up reveals worsening pleural effusion and acute-on-chronic CHF.  Underwent thoracentesis on 8/30. Underwent placement of tunneled R-sided pleural drainage catheter on 9/4.  GOC addressed on 9/4. Pt would like to remain full code at this time per palliative team note on 9/4.  Continues on a Dysphagia 3 diet at this time. Per document flowsheets, intake of meals has been adequate, consuming 85-100%. Continues with order for Glucerna Shake daily. RN confirms that pt is eating very well at this time.  Height: Ht Readings from Last 1 Encounters:  05/04/13 5\' 5"  (1.651 m)    Weight: Wt Readings from Last 1 Encounters:  05/12/13 166 lb 6.4 oz (75.479 kg)  Admit wt 166 lb - stable.  BMI:  Body mass index is 27.69 kg/(m^2). Overweight.  Estimated Nutritional Needs: Kcal: 1400 - 1600 Protein: 55 - 65 g Fluid: 1.5 liters daily  Skin:  R back incision R abdominal incision  Diet Order: Dysphagia 3    Intake/Output Summary (Last 24 hours) at 05/12/13 1041 Last data filed at 05/12/13 0853  Gross per 24 hour  Intake    840 ml  Output   1100 ml  Net   -260 ml    Last BM: 9/4  Labs:   Recent Labs Lab 05/08/13 0400  05/10/13 0457 05/11/13 0550  05/12/13 0542  NA 136  < > 134* 136 136  K 4.2  < > 4.6 4.2 4.4  CL 94*  < > 92* 91* 90*  CO2 38*  < > 36* 39* 39*  BUN 58*  < > 59* 57* 57*  CREATININE 1.63*  < > 1.39* 1.47* 1.65*  CALCIUM 8.5  < > 8.7 8.8 8.3*  MG 1.8  --   --   --   --   GLUCOSE 219*  < > 226* 177* 197*  < > = values in this interval not displayed.  CBG (last 3)   Recent Labs  05/11/13 1619 05/11/13 2125 05/12/13 0632  GLUCAP 258* 272* 222*    Scheduled Meds: . allopurinol  100 mg Oral Daily  . ALPRAZolam  1 mg Oral QHS  . aspirin EC  81 mg Oral QHS  . atorvastatin  40 mg Oral QHS  . feeding supplement  237 mL Oral Q24H  . heparin  5,000 Units Subcutaneous Q8H  . hydrALAZINE  25 mg Oral Q8H  . insulin aspart  0-15 Units Subcutaneous TID WC  . insulin aspart  0-5 Units Subcutaneous QHS  . isosorbide mononitrate  30 mg Oral Daily  . levothyroxine  125 mcg Oral QAC breakfast  . pantoprazole  40 mg Oral Daily  . sevelamer carbonate  800 mg Oral BID WC  . torsemide  100 mg Oral BID  . Vitamin D (Ergocalciferol)  50,000 Units Oral Q M,W,F    Continuous Infusions:  none   Jarold Motto MS, RD,  LDN Pager: [phone] After-hours pager: [phone]

## 2013-05-12 NOTE — Care Management Note (Signed)
    Page 1 of 2   05/12/2013     2:21:16 PM   CARE MANAGEMENT NOTE 05/12/2013  Patient:  Miranda Cruz, Miranda Cruz   Account Number:  [id number]  Date Initiated:  05/09/2013  Documentation initiated by:  [facility]  Subjective/Objective Assessment:   y.o. female with a Past Medical History of chronic systolic heart failure, ischemic cardiomyopathy, stage III-IV chronic kidney disease who presents today with Shortness of breath-worsening this morning//hm with daughter     Action/Plan:   admit to telemetry, start on IV Lasix, scheduled nebulized bronchodilators.Check daily weights and strict I and O's//home with self care   Anticipated DC Date:  05/10/2013   Anticipated DC Plan:  HOME/SELF CARE      DC Planning Services  CM consult      Choice offered to / List presented to:  C-1 Patient        HH arranged  HH-1 RN  HH-2 PT  HH-3 OT  HH-4 NURSE'S AIDE      HH agency  [facility]   Status of service:   Medicare Important Message given?   (If response is "NO", the following Medicare IM given date fields will be blank) Date Medicare IM given:   Date Additional Medicare IM given:    Discharge Disposition:    Per UR Regulation:    If discussed at Long Length of Stay Meetings, dates discussed:    Comments:  05/12/13 1330 CAMELLIA WOOD, RN, BSN, [employer] [phone] Spoke with pt  regarding discharge planning.  Offered pt list of Home Health Agencies.  Pt chose [name] to render services of RN, PT, OT, NA. Pt also has plurex catheter and will require management of reservoir.  Corrie Dandy of [place] notified.  No DME needs identified at this time.

## 2013-05-13 LAB — BASIC METABOLIC PANEL
Chloride: 90 mEq/L — ABNORMAL LOW (ref 96–112)
GFR calc Af Amer: 36 mL/min — ABNORMAL LOW (ref >90)
GFR calc non Af Amer: 31 mL/min — ABNORMAL LOW (ref >90)
Glucose, Bld: 121 mg/dL — ABNORMAL HIGH (ref 70–99)
Potassium: 4.4 mEq/L (ref 3.5–5.1)
Sodium: 135 mEq/L (ref 135–145)

## 2013-05-13 LAB — GLUCOSE, CAPILLARY
Glucose-Capillary: 139 mg/dL — ABNORMAL HIGH (ref 70–99)
Glucose-Capillary: 187 mg/dL — ABNORMAL HIGH (ref 70–99)

## 2013-05-13 MED ORDER — LISINOPRIL 5 MG PO TABS
5.0000 mg | ORAL_TABLET | Freq: Every day | ORAL | Status: DC
  Administered 2013-05-13: 13:00:00 5 mg via ORAL
  Filled 2013-05-13 (×3): qty 1

## 2013-05-13 MED ORDER — FUROSEMIDE 10 MG/ML IJ SOLN: 40.0000 mg | Freq: Once | INTRAMUSCULAR | Status: DC

## 2013-05-13 MED ORDER — METOLAZONE 2.5 MG PO TABS
2.5000 mg | ORAL_TABLET | Freq: Once | ORAL | Status: AC
  Administered 2013-05-13: 13:00:00 2.5 mg via ORAL
  Filled 2013-05-13 (×2): qty 1

## 2013-05-13 NOTE — Progress Notes (Signed)
Subjective: Sitting up, no CP. Mild SOB with activity  Objective:  Vital Signs in the last 24 hours: Temp:  [98 F (36.7 C)-98.4 F (36.9 C)] 98 F (36.7 C) (09/06 0622) Pulse Rate:  [70-73] 70 (09/06 0622) Resp:  [16-18] 16 (09/06 0622) BP: (108-130)/(41-56) 108/41 mmHg (09/06 0622) SpO2:  [94 %-96 %] 94 % (09/06 0622) Weight:  [76.1 kg (167 lb 12.3 oz)] 76.1 kg (167 lb 12.3 oz) (09/06 0622)  Intake/Output from previous day: 09/05 0701 - 09/06 0700 In: 1300 [P.O.:1300] Out: 970 [Urine:970]   Physical Exam: General: Well developed, well nourished, in no acute distress. Head:  Normocephalic and atraumatic. Lungs: Decreased BS bases Heart: Normal S1 and S2.  No murmur, rubs or gallops.  Abdomen: soft, non-tender, positive bowel sounds. Extremities: No clubbing or cyanosis. No edema. Neurologic: Alert and oriented x 3.    Lab Results: No results found for this basename: WBC, HGB, PLT,  in the last 72 hours  Recent Labs  05/12/13 0542 05/13/13 0358  NA 136 135  K 4.4 4.4  CL 90* 90*  CO2 39* 38*  GLUCOSE 197* 121*  BUN 57* 60*  CREATININE 1.65* 1.52*  Imaging: Ir Perc Pleural Drain W/indwell Cath W/img Guide  05/11/2013   *RADIOLOGY REPORT*  Clinical Data:  Recurrent symptomatic right-sided pleural effusion in patient with history of recurrent CHF, now seeking palliative therapy  INSERTION OF TUNNELED RIGHT SIDED PLEURAL DRAINAGE CATHETER  Comparison:  Ultrasound guided thoracentesis - 04/28/2013; chest radiograph - 05/10/2013; 05/08/2013; chest CT - 05/06/2013  Intravenous medications: Versed 2 mg IV; Fentanyl 75 mcg IV; Ancef 2 gram IV; Antibiotic was administered in an appropriate time interval for the procedure.  Total Moderate Sedation Time: 15 minutes.  Fluoroscopy time:  36 seconds  Complications:  None immediate  Procedure:  The procedure, risks, benefits, and alternatives were explained to the patient and the patient's daughter, who wish to proceed with the placement  of this permanent pleural catheter as the patient is seeking palliative care.  The patient and the patient's wife understand and consent to the procedure.  The right lateral chest and right upper abdomen were prepped with Chlorhexidine in a sterile fashion, and a sterile drape was applied covering the operative field.  A sterile gown and sterile gloves were used for the procedure.  Initial ultrasound scanning and fluoroscopic imaging demonstrates a recurrent moderate to large right-sided pleural effusion.  Under direct ultrasound guidance, the right inferior lateral pleural space was accessed with an 19 gauge trocar needle after the overlying soft tissues were anesthetized with 1% lidocaine with epinephrine. An Amplatz superstiff wire was then advanced under fluoroscopy into the pleural space.  A 16 French tunneled pleural catheter was tunneled from an incision within the right upper abdominal quadrant to the access site. The pleural access site was serially dilated under fluoroscopy, ultimately allowing placement of a peel-away sheath.  The catheter was advanced through the peel-away sheath.  The sheath was then removed.  Final catheter positioning was confirmed with a fluoroscopic radiographic image.  The access incision was closed with subcutaneous subcuticular 4-0 Vicryl, Dermabond and Steri-Strips.  A Prolene retention suture was applied at the catheter exit site.  Large volume thoracentesis was performed through the new catheter utilizing provided bulb vacuum assisted drainage bag.  The patient tolerated the above procedure well without immediate postprocedural complication.  Findings:  The catheter was placed via the right lateral chest wall.  Catheter course is towards the apex.  Approximately  1.3 liters of serous pleural fluid was able to be removed after catheter placement.  IMPRESSION:  Successful placement of permanent, tunneled right pleural drainage catheter via lateral approach.  Approximately 1.3  liters of serous pleural fluid was removed after catheter placement.   Original Report Authenticated By: Tacey Ruiz, MD   Personally viewed.   Telemetry: AFIB V-paced Personally viewed.   Assessment/Plan:  Principal Problem:   Acute on chronic systolic CHF (congestive heart failure) Active Problems:   CKD (chronic kidney disease) stage 4, GFR 15-29 ml/min   Cardiomyopathy, ischemic   CAD (coronary artery disease)   Encephalopathy acute   Pleural effusion - right   Cardiac pacemaker in situ  -will give one dose of metolazone 2.5mg  -try for accurate I/O -Continue with demadex 100 BID -Pleurex cath in place -Continue current meds for afib and cardiomyopathy -Perhaps DC tomorrow.   SKAINS, MARK 05/13/2013, 11:15 AM

## 2013-05-13 NOTE — Progress Notes (Addendum)
Tolerated being up in chair for 2 hours.  Able to shift weight.  Ambulated in room with walker from bed to door and back x 2.  In bed at this time.  1844 Dressing to pleural cath site changed, large amount of serosanguinous drainage along with a light brown drainage noted on dressing.  Cath remains sutured in place.  Kandis Fantasia RN assisted with dressing change.  Bloody drainage in tubing.  Productive cough noted when turning patient. Dr. Robb Matar notified of dressing being saturated and stated, " will talk with other physicians tomorrow".

## 2013-05-13 NOTE — Progress Notes (Signed)
05/12/13 Patient is A/Ox4, ambulatory with 1 person assist to [facility]. Dressing to right upper back is clean and intact. She has been on room air throughout the shift and has had no sign of distress. She has had some c/o pain during the shift and prn pain medication was given. Patient's daughter called for update.

## 2013-05-13 NOTE — Progress Notes (Signed)
[facility] PROGRESS NOTE  Assessment/Plan: Acute on chronic systolic CHF (congestive heart failure) EF 35%: - On torsemide twice a day, Imdur add hydralazine. Add low dose metolazone. - Monitor Cr, appreciate cardiology assistance. - Strict I and O's.,  good UOP. - meeting with palliative care on 9.4.2014. - home in am.  A fib: - EKG showed afib,starting eloquis. - Currently rate controlled.  Pleural effusion - right due to Heart failure: - pulmonary consult rec pleurex cath. - 1.3 L from pleurex cath. - IR to provide instructions.  CKD (chronic kidney disease) stage 4, GFR 15-29 ml/min - baseline 1.8, cont lasix.    Code Status: full Family Communication: none  Disposition Plan: inpatient   Consultants:  Cardiology  Pulmonary  Procedures:  CT chest  Pleurex cath.  Antibiotics:  None  HPI/Subjective: She relates she feels better. Objective: Filed Vitals:   05/12/13 1101 05/12/13 1409 05/12/13 2013 05/13/13 0622  BP:  114/51 130/56 108/41  Pulse: 70 70 73 70  Temp:  98.4 F (36.9 C) 98.3 F (36.8 C) 98 F (36.7 C)  TempSrc:  Oral Oral Oral  Resp:  18 18 16   Height:      Weight:    76.1 kg (167 lb 12.3 oz)  SpO2: 96% 94% 96% 94%    Intake/Output Summary (Last 24 hours) at 05/13/13 1259 Last data filed at 05/13/13 0900  Gross per 24 hour  Intake   1080 ml  Output    720 ml  Net    360 ml   Filed Weights   05/11/13 0507 05/12/13 0529 05/13/13 0622  Weight: 76.1 kg (167 lb 12.3 oz) 75.479 kg (166 lb 6.4 oz) 76.1 kg (167 lb 12.3 oz)    Exam:  General: Alert, awake, oriented x3, in no acute distress.  HEENT: No bruits, no goiter.  Heart: Regular rate and rhythm, without murmurs, rubs, gallops.  Lungs: clear to auscultation, pleurex cath in place.    Data Reviewed: Basic Metabolic Panel:  Recent Labs Lab 05/08/13 0400 05/09/13 0420 05/10/13 0457 05/11/13 0550 05/12/13 0542 05/13/13 0358  NA 136 137 134* 136 136 135  K 4.2  4.3 4.6 4.2 4.4 4.4  CL 94* 94* 92* 91* 90* 90*  CO2 38* 39* 36* 39* 39* 38*  GLUCOSE 219* 208* 226* 177* 197* 121*  BUN 58* 61* 59* 57* 57* 60*  CREATININE 1.63* 1.50* 1.39* 1.47* 1.65* 1.52*  CALCIUM 8.5 8.8 8.7 8.8 8.3* 8.5  MG 1.8  --   --   --   --   --    Liver Function Tests: No results found for this basename: AST, ALT, ALKPHOS, BILITOT, PROT, ALBUMIN,  in the last 168 hours No results found for this basename: LIPASE, AMYLASE,  in the last 168 hours No results found for this basename: AMMONIA,  in the last 168 hours CBC:  Recent Labs Lab 05/07/13 0430 05/08/13 0400  WBC 8.9 10.0  HGB 12.5 12.5  HCT 38.5 39.3  MCV 91.2 92.0  PLT 166 176   Cardiac Enzymes: No results found for this basename: CKTOTAL, CKMB, CKMBINDEX, TROPONINI,  in the last 168 hours BNP (last 3 results)  Recent Labs  04/27/13 2019 05/06/13 0432 05/07/13 0430  PROBNP 6703.0* 4186.0* 4272.0*   CBG:  Recent Labs Lab 05/12/13 1136 05/12/13 1557 05/12/13 2045 05/13/13 0603 05/13/13 1054  GLUCAP 302* 111* 319* 139* 238*    Recent Results (from the past 240 hour(s))  BODY FLUID CULTURE  Status: None   Collection Time    05/06/13 12:27 PM      Result Value Range Status   Specimen Description FLUID RIGHT PLEURAL   Final   Special Requests NONE   Final   Gram Stain     Final   Value: CYTOSPIN WBC PRESENT,BOTH PMN AND MONONUCLEAR     NO ORGANISMS SEEN     Performed at [facility]   Culture     Final   Value: NO GROWTH 3 DAYS     Performed at [facility]   Report Status 05/09/2013 FINAL   Final     Studies: No results found.  Scheduled Meds: . allopurinol  100 mg Oral Daily  . ALPRAZolam  1 mg Oral QHS  . aspirin EC  81 mg Oral QHS  . atorvastatin  40 mg Oral QHS  . feeding supplement  237 mL Oral Q24H  . heparin  5,000 Units Subcutaneous Q8H  . hydrALAZINE  25 mg Oral Q8H  . insulin aspart  0-15 Units Subcutaneous TID WC  . insulin aspart  0-5 Units  Subcutaneous QHS  . isosorbide mononitrate  30 mg Oral Daily  . levothyroxine  125 mcg Oral QAC breakfast  . lisinopril  5 mg Oral Daily  . metolazone  2.5 mg Oral Once  . pantoprazole  40 mg Oral Daily  . polyethylene glycol  17 g Oral Daily  . sevelamer carbonate  800 mg Oral BID WC  . torsemide  100 mg Oral BID  . Vitamin D (Ergocalciferol)  50,000 Units Oral Q M,W,F   Continuous Infusions:    Marinda Elk  [facility] Pager [phone]. If 8PM-8AM, please contact night-coverage at [email or web address], password [facility] 05/13/2013, 12:59 PM  LOS: 9 days

## 2013-05-13 NOTE — Progress Notes (Signed)
Pt had low BP of 72/40, HR 70, which was manual in the L arm, checked R arm manually and it was 82/40, HR 70 no S/S,  called MD about it, ordered to hold Hydralazine and to recheck BP manually again, will continue to monitor, Thanks, Lavonda Jumbo RN

## 2013-05-14 DIAGNOSIS — K5900 Constipation, unspecified: Secondary | ICD-10-CM

## 2013-05-14 DIAGNOSIS — I959 Hypotension, unspecified: Secondary | ICD-10-CM

## 2013-05-14 DIAGNOSIS — N184 Chronic kidney disease, stage 4 (severe): Secondary | ICD-10-CM

## 2013-05-14 DIAGNOSIS — I5023 Acute on chronic systolic (congestive) heart failure: Secondary | ICD-10-CM

## 2013-05-14 DIAGNOSIS — Z950 Presence of cardiac pacemaker: Secondary | ICD-10-CM

## 2013-05-14 DIAGNOSIS — I509 Heart failure, unspecified: Secondary | ICD-10-CM

## 2013-05-14 DIAGNOSIS — J90 Pleural effusion, not elsewhere classified: Secondary | ICD-10-CM

## 2013-05-14 DIAGNOSIS — J9600 Acute respiratory failure, unspecified whether with hypoxia or hypercapnia: Secondary | ICD-10-CM

## 2013-05-14 DIAGNOSIS — Z515 Encounter for palliative care: Secondary | ICD-10-CM

## 2013-05-14 DIAGNOSIS — E119 Type 2 diabetes mellitus without complications: Secondary | ICD-10-CM

## 2013-05-14 DIAGNOSIS — N179 Acute kidney failure, unspecified: Secondary | ICD-10-CM

## 2013-05-14 DIAGNOSIS — I2510 Atherosclerotic heart disease of native coronary artery without angina pectoris: Secondary | ICD-10-CM

## 2013-05-14 LAB — GLUCOSE, CAPILLARY
Glucose-Capillary: 241 mg/dL — ABNORMAL HIGH (ref 70–99)
Glucose-Capillary: 180 mg/dL — ABNORMAL HIGH (ref 70–99)

## 2013-05-14 LAB — BASIC METABOLIC PANEL
Chloride: 88 mEq/L — ABNORMAL LOW (ref 96–112)
Chloride: 88 mEq/L — ABNORMAL LOW (ref 96–112)
GFR calc Af Amer: 21 mL/min — ABNORMAL LOW (ref >90)
GFR calc Af Amer: 20 mL/min — ABNORMAL LOW (ref >90)
GFR calc non Af Amer: 18 mL/min — ABNORMAL LOW (ref >90)
GFR calc non Af Amer: 18 mL/min — ABNORMAL LOW (ref >90)
Potassium: 5.1 mEq/L (ref 3.5–5.1)
Potassium: 4.9 mEq/L (ref 3.5–5.1)
Sodium: 131 mEq/L — ABNORMAL LOW (ref 135–145)
Sodium: 131 mEq/L — ABNORMAL LOW (ref 135–145)

## 2013-05-14 MED ORDER — CARVEDILOL 6.25 MG PO TABS
6.2500 mg | ORAL_TABLET | Freq: Two times a day (BID) | ORAL | Status: DC
  Administered 2013-05-14: 6.25 mg via ORAL
  Filled 2013-05-14 (×4): qty 1

## 2013-05-14 MED ORDER — SODIUM CHLORIDE 0.9 % IV BOLUS (SEPSIS)
250.0000 mL | Freq: Once | INTRAVENOUS | Status: AC
  Administered 2013-05-14: 09:00:00 via INTRAVENOUS

## 2013-05-14 NOTE — Progress Notes (Signed)
Patient Miranda Cruz      DOB: 09-28-1929      ZOX:[id number]  Emotional support offered.  Patient sad because she lost her companion dog of 14 yrs while she was in the hospital.  He had a stroke and had to be put down.  She is devastated.  She was also depressed about having worsening in her renal function but she stated that she understands that she isn't giving the doctors a lot to work with .  Her goal remains to improve enough to go home.     Marshea Wisher L. Ladona Ridgel, MD MBA The Palliative Medicine Team at [facility] Phone: [phone] Pager: [phone]

## 2013-05-14 NOTE — Progress Notes (Signed)
Subjective:  She was disappointed that she was unable to go home today. Last evening, blood pressure decreased, requiring holding of several of her medications. Lisinopril was given one dose 5 mg and discontinued.  This morning, IV fluids 250 mL bolus was given.   Objective:  Vital Signs in the last 24 hours: Temp:  [97.4 F (36.3 C)-98 F (36.7 C)] 98 F (36.7 C) (09/07 0543) Pulse Rate:  [70] 70 (09/07 0900) Resp:  [18-20] 18 (09/07 0900) BP: (72-118)/(40-62) 102/58 mmHg (09/07 0900) SpO2:  [90 %-96 %] 92 % (09/07 0900) Weight:  [77.6 kg (171 lb 1.2 oz)] 77.6 kg (171 lb 1.2 oz) (09/07 0543)  Intake/Output from previous day: 09/06 0701 - 09/07 0700 In: 270 [P.O.:270] Out: 800 [Urine:800]   Physical Exam: General: Elderly well nourished, in no acute distress. Head:  Normocephalic and atraumatic. Lungs: Clear to auscultation and percussion. Pleurx catheter, mildly decreased breath sounds at bases. Heart: Normal S1 and S2.  No murmur, rubs or gallops.  Abdomen: soft, non-tender, positive bowel sounds. Extremities: No clubbing or cyanosis. No edema. Neurologic: Alert and oriented x 3.    Lab Results: No results found for this basename: WBC, HGB, PLT,  in the last 72 hours  Recent Labs  05/13/13 0358 05/14/13 0420  NA 135 131*  K 4.4 5.1  CL 90* 88*  CO2 38* 36*  GLUCOSE 121* 230*  BUN 60* 75*  CREATININE 1.52* 2.41*   Telemetry: Atrial fibrillation with ventricular pacing Personally viewed.  Cardiac Studies:  EF 30  Assessment/Plan:   79 year old female with acute on chronic systolic heart failure, recurrent pleural effusions requiring Pleurx catheter, chronic kidney disease stage IV with acute renal failure on multidrug regimen, new start anticoagulation for atrial fibrillation.  1. Acute renal failure - in the setting of chronic kidney disease stage IV. She was battling hypotension yesterday. She was given both metolazone 2.5 mg as well as her normal Demadex  dose secondary to lack of adequate output over the last several days. Her creatinine over the past several days has been better than her baseline at approximately 1.5 (baseline 1.8). She was also administered a single dose of lisinopril 5 mg. This has been discontinued. She states that she remembers me telling her not to use ACE inhibitors again on previous clinical encounter.   -Holding blood pressure medications -IV fluid 250 bolus -Holding diuretics -Monitoring blood pressure -Avoiding ACE inhibitors.  2. Acute on chronic systolic heart failure  - Challenging situation with Lasix demonstrating no significant efficacy at this point. Change to [facility] on this admission, currently max dose. Received metolazone once yesterday. Reaccumulation of pleural effusion likely secondary from systolic heart failure. Chronic kidney disease. Mild hyponatremia.  - She is frustrated that she has been feeling this way and I described that with her systolic heart failure, kidney disease, reaccumulation of pleural effusion these are all signs of worsening prognostic factors in heart failure. I appreciate assistance from palliative care team as well. My goals are to help her as much as possible with her dyspnea. I explained to her that there is often a fine balance especially with multiple system organ dysfunction.  3. Atrial fibrillation  - Continue with anticoagulation, half dose.  We'll continue to monitor her. No discharge today. Recheck a basic metabolic profile in the afternoon. I have discussed Dr. Robb Matar as well as nursing.   Jill Ruppe 05/14/2013, 11:49 AM

## 2013-05-14 NOTE — Progress Notes (Addendum)
Family called expressed concern over patient receiving a dose of Lisinopril on 9/06.  Client told them that she had asked to not be given med, however, this was not voiced when drug was given and the drug was not listed in Allergies.  This was told Miranda Cruz the daughter-in-law that called.  There has been conversation with Miranda Cruz about med and drug was discontinued, several meds held this morning.  Currently she is receiving an IV bolus of Normal Saline of 250 cc over one hour.    I have listed the drug in Allergy section of chart today with the reason why it should not be given.  Client has issues with "Kidney function", if given this drug.    Dr. Anne Cruz spoke with patient in detail on rounds.  See his notes.  Dr Miranda Cruz into speak with family members and the issue has been resolved.  BP back up past bolus to 112/56.  Pulse remains at 70.  Miranda Cruz her daughter has been informed that [name] is the home care facility that will get in touch with them per Care Management.  Client remains stable.  See vital sign documentation

## 2013-05-14 NOTE — Progress Notes (Signed)
[facility] PROGRESS NOTE  Assessment/Plan: Acute on chronic systolic CHF (congestive heart failure) EF 35%: - Hold torsemide, Imdur add hydralazine - Cr increasing. D/c lisinopril, torsemide and give bolus 500cc - Strict I and O's.,  good UOP.  A fib: - EKG showed afib,starting eloquis. - Currently rate controlled.  Pleural effusion - right due to Heart failure: - pulmonary consult rec pleurex cath. - 1.3 L from pleurex cath. - IR to provide instructions.  Acute on CKD (chronic kidney disease) stage 4, GFR 15-29 ml/min - baseline 1.8, increase to 2.4 with demadex, ACE and metolazone    Code Status: full Family Communication: none  Disposition Plan: inpatient   Consultants:  Cardiology  Pulmonary  Procedures:  CT chest  Pleurex cath.  Antibiotics:  None  HPI/Subjective: No complains  Objective: Filed Vitals:   05/13/13 2330 05/14/13 0543 05/14/13 0800 05/14/13 0900  BP: 90/40 92/47 98/60  102/58  Pulse: 70 70  70  Temp:  98 F (36.7 C)    TempSrc:  Oral    Resp:  20  18  Height:      Weight:  77.6 kg (171 lb 1.2 oz)    SpO2:  90%  92%    Intake/Output Summary (Last 24 hours) at 05/14/13 1215 Last data filed at 05/14/13 1016  Gross per 24 hour  Intake    520 ml  Output   1101 ml  Net   -581 ml   Filed Weights   05/12/13 0529 05/13/13 0622 05/14/13 0543  Weight: 75.479 kg (166 lb 6.4 oz) 76.1 kg (167 lb 12.3 oz) 77.6 kg (171 lb 1.2 oz)    Exam:  General: Alert, awake, oriented x3, in no acute distress.  HEENT: No bruits, no goiter.  Heart: Regular rate and rhythm, without murmurs, rubs, gallops.  Lungs: clear to auscultation, pleurex cath in place.    Data Reviewed: Basic Metabolic Panel:  Recent Labs Lab 05/08/13 0400  05/10/13 0457 05/11/13 0550 05/12/13 0542 05/13/13 0358 05/14/13 0420  NA 136  < > 134* 136 136 135 131*  K 4.2  < > 4.6 4.2 4.4 4.4 5.1  CL 94*  < > 92* 91* 90* 90* 88*  CO2 38*  < > 36* 39* 39* 38*  36*  GLUCOSE 219*  < > 226* 177* 197* 121* 230*  BUN 58*  < > 59* 57* 57* 60* 75*  CREATININE 1.63*  < > 1.39* 1.47* 1.65* 1.52* 2.41*  CALCIUM 8.5  < > 8.7 8.8 8.3* 8.5 8.2*  MG 1.8  --   --   --   --   --   --   < > = values in this interval not displayed. Liver Function Tests: No results found for this basename: AST, ALT, ALKPHOS, BILITOT, PROT, ALBUMIN,  in the last 168 hours No results found for this basename: LIPASE, AMYLASE,  in the last 168 hours No results found for this basename: AMMONIA,  in the last 168 hours CBC:  Recent Labs Lab 05/08/13 0400  WBC 10.0  HGB 12.5  HCT 39.3  MCV 92.0  PLT 176   Cardiac Enzymes: No results found for this basename: CKTOTAL, CKMB, CKMBINDEX, TROPONINI,  in the last 168 hours BNP (last 3 results)  Recent Labs  04/27/13 2019 05/06/13 0432 05/07/13 0430  PROBNP 6703.0* 4186.0* 4272.0*   CBG:  Recent Labs Lab 05/13/13 0603 05/13/13 1054 05/13/13 1625 05/13/13 2118 05/14/13 0620  GLUCAP 139* 238* 187* 244* 241*  Recent Results (from the past 240 hour(s))  BODY FLUID CULTURE     Status: None   Collection Time    05/06/13 12:27 PM      Result Value Range Status   Specimen Description FLUID RIGHT PLEURAL   Final   Special Requests NONE   Final   Gram Stain     Final   Value: CYTOSPIN WBC PRESENT,BOTH PMN AND MONONUCLEAR     NO ORGANISMS SEEN     Performed at [facility]   Culture     Final   Value: NO GROWTH 3 DAYS     Performed at [facility]   Report Status 05/09/2013 FINAL   Final     Studies: No results found.  Scheduled Meds: . allopurinol  100 mg Oral Daily  . ALPRAZolam  1 mg Oral QHS  . aspirin EC  81 mg Oral QHS  . atorvastatin  40 mg Oral QHS  . feeding supplement  237 mL Oral Q24H  . heparin  5,000 Units Subcutaneous Q8H  . hydrALAZINE  25 mg Oral Q8H  . insulin aspart  0-15 Units Subcutaneous TID WC  . insulin aspart  0-5 Units Subcutaneous QHS  . isosorbide mononitrate  30 mg  Oral Daily  . levothyroxine  125 mcg Oral QAC breakfast  . pantoprazole  40 mg Oral Daily  . polyethylene glycol  17 g Oral Daily  . sevelamer carbonate  800 mg Oral BID WC  . torsemide  100 mg Oral BID  . Vitamin D (Ergocalciferol)  50,000 Units Oral Q M,W,F   Continuous Infusions:    Marinda Elk  [facility] Pager [phone]. If 8PM-8AM, please contact night-coverage at [email or web address], password [facility] 05/14/2013, 12:15 PM  LOS: 10 days

## 2013-05-15 DIAGNOSIS — I959 Hypotension, unspecified: Secondary | ICD-10-CM

## 2013-05-15 LAB — BASIC METABOLIC PANEL
Calcium: 8.3 mg/dL — ABNORMAL LOW (ref 8.4–10.5)
Creatinine, Ser: 2.75 mg/dL — ABNORMAL HIGH (ref 0.50–1.10)
GFR calc Af Amer: 18 mL/min — ABNORMAL LOW (ref >90)
GFR calc non Af Amer: 15 mL/min — ABNORMAL LOW (ref >90)
Sodium: 130 mEq/L — ABNORMAL LOW (ref 135–145)

## 2013-05-15 LAB — GLUCOSE, CAPILLARY
Glucose-Capillary: 181 mg/dL — ABNORMAL HIGH (ref 70–99)
Glucose-Capillary: 180 mg/dL — ABNORMAL HIGH (ref 70–99)

## 2013-05-15 MED ORDER — SODIUM CHLORIDE 0.9 % IV BOLUS (SEPSIS): 500.0000 mL | Freq: Once | INTRAVENOUS | Status: DC

## 2013-05-15 MED ORDER — CARVEDILOL 3.125 MG PO TABS: 3.1250 mg | ORAL_TABLET | Freq: Two times a day (BID) | ORAL | Status: DC

## 2013-05-15 MED ORDER — SODIUM CHLORIDE 0.9 % IV BOLUS (SEPSIS)
250.0000 mL | Freq: Once | INTRAVENOUS | Status: AC
  Administered 2013-05-15: 250 mL via INTRAVENOUS

## 2013-05-15 MED ORDER — CARVEDILOL 3.125 MG PO TABS
3.1250 mg | ORAL_TABLET | Freq: Two times a day (BID) | ORAL | Status: DC
  Administered 2013-05-15: 17:00:00 3.125 mg via ORAL
  Filled 2013-05-15 (×3): qty 1

## 2013-05-15 NOTE — Progress Notes (Signed)
Pt's dtr-in-law phone number sent to Dr. Pricilla Holm to call her as she has requested.  Amanda Pea, Charity fundraiser.

## 2013-05-15 NOTE — Discharge Summary (Addendum)
Physician Discharge Summary  Miranda Cruz ZOX:[id number] DOB: [birth year] DOA: 05/04/2013  PCP: Hoyle Sauer, MD  Admit date: 05/04/2013 Discharge date: 05/15/2013  Time spent: 35 minutes  Recommendations for Outpatient Follow-up:  1. Follow up with Dr.Skains on 9.11.2014 with a b-met. 2. Follow with Dr. Virgel Manifold in 1-2 days.  Discharge Diagnoses:  Principal Problem:   Acute on chronic systolic CHF (congestive heart failure) Active Problems:   Pleural effusion - right   CKD (chronic kidney disease) stage 4, GFR 15-29 ml/min   Cardiomyopathy, ischemic   CAD (coronary artery disease)   Encephalopathy acute   Cardiac pacemaker in situ   Discharge Condition: guarded  Diet recommendation: heart healthy diet  Filed Weights   05/13/13 0622 05/14/13 0543 05/15/13 0612  Weight: 76.1 kg (167 lb 12.3 oz) 77.6 kg (171 lb 1.2 oz) 79.062 kg (174 lb 4.8 oz)    History of present illness:  79 y.o. female with a Past Medical History of chronic systolic heart failure, ischemic cardiomyopathy, stage III-IV chronic kidney disease who presents today with the above noted complaint. Patient just was discharged from the hospital a few days ago, she claims upon discharge she was doing well, and felt that she was back to her baseline. This morning, patient fell that she was much more short of breath, and felt that if she did not seek medical attention she would get much worse. She went to her primary care practitioner's office, where a chest x-ray apparently demonstrated worsening of the pleural effusion, she was then referred to the hospitalist service as a direct admission.  Upon further evaluation, patient was not able to clarify whether the shortness of breath was at exertion or rest. She does claim that she has worsening lower extremity edema. Upon my evaluation, she was easily able to lie flat.  She does claim that she had a nagging cough over the past few days, it is at times productive. She denies any  fever.  History of nausea, vomiting or diarrhea.  She is now being admitted for further evaluation and treatment   Hospital Course:  Acute on chronic systolic CHF (congestive heart failure) EF 35%:  - Start on IV diuretics with good diureses. - As pleural effusion re-acumulated, IR was consulted for Pleur-ex cath, d/w pulmonary. - Put on torsemide 100 mg twice a day, low dose  metolazone. She was diuresing well. - Cr. Increase and diuretics, Imdur, ACE-i and hydralazine were a stopped. - Family was demanding to take her home, the risk and benefit were explained to the family. And the need of her being in the hospital due to her multi-organ dysfunction. I have explained to her that her Bp has been boderline low and her creatinie has increase despite hold her diuretics. They related they understand and will still like to take her home. - She will be send home on coreg low dose. Has a cardiology appointment  On 9.11.2014 as an out patient - Dry weight is 79.0 Kg.  A. fib:  - EKG showed afib,starting eloquis. This was held due to her renal function. - will send home os ASA, once her cr. Improve she can be started on Eloquis. - Currently rate controlled.   Pleural effusion - right due to Heart failure:  - pulmonary consult rec pleurex cath.  - 1.3 L from pleurex cath.  - IR to provide instructions.   CKD (chronic kidney disease) stage 4, GFR 15-29 ml/min  - baseline 1.8,   Procedures:  CT chest  8.30.2014  CXR 8.30.2014  IR 9.4.2014  Consultations:  Pulmonary  Cardiology  IR   Discharge Exam: Filed Vitals:   05/15/13 1332  BP: 98/45  Pulse: 69  Temp: 97.1 F (36.2 C)  Resp: 18    General: A&O x3 Cardiovascular: RRR Respiratory: good air movement CTA B/L  Discharge Instructions      Discharge Orders   Future Orders Complete By Expires   Diet - low sodium heart healthy  As directed    Increase activity slowly  As directed        Medication List    STOP  taking these medications       furosemide 80 MG tablet  Commonly known as:  LASIX     isosorbide mononitrate 30 MG 24 hr tablet  Commonly known as:  IMDUR      TAKE these medications       acetaminophen 500 MG tablet  Commonly known as:  TYLENOL  Take 1,000 mg by mouth daily as needed for pain.     allopurinol 100 MG tablet  Commonly known as:  ZYLOPRIM  Take 100 mg by mouth every morning.     ALPRAZolam 1 MG tablet  Commonly known as:  XANAX  Take 1 mg by mouth at bedtime.     aspirin EC 81 MG tablet  Take 81 mg by mouth at bedtime.     atorvastatin 40 MG tablet  Commonly known as:  LIPITOR  Take 40 mg by mouth at bedtime.     BENGAY EX  Apply 1 application topically daily as needed (hand pain).     carvedilol 3.125 MG tablet  Commonly known as:  COREG  Take 1 tablet (3.125 mg total) by mouth 2 (two) times daily with a meal.     DAYQUIL PO  Take by mouth daily as needed (cough). Small capful     fluticasone 50 MCG/ACT nasal spray  Commonly known as:  FLONASE  Place 2 sprays into the nose 2 (two) times daily as needed for rhinitis or allergies.     glyBURIDE 2.5 MG tablet  Commonly known as:  DIABETA  Take 1.25 mg by mouth daily as needed (if sugar is over 120).     guaiFENesin 600 MG 12 hr tablet  Commonly known as:  MUCINEX  Take 1 tablet (600 mg total) by mouth 2 (two) times daily.     levalbuterol 45 MCG/ACT inhaler  Commonly known as:  XOPENEX HFA  Inhale 1-2 puffs into the lungs 3 (three) times daily as needed for wheezing or shortness of breath.     levothyroxine 125 MCG tablet  Commonly known as:  SYNTHROID, LEVOTHROID  Take 125 mcg by mouth daily before breakfast.     nitroGLYCERIN 0.4 MG SL tablet  Commonly known as:  NITROSTAT  Place 0.4 mg under the tongue as needed for chest pain. x3 doses as needed for chest pain     NYQUIL PO  Take by mouth at bedtime as needed (cough). Small capful     omega-3 acid ethyl esters 1 G capsule  Commonly  known as:  LOVAZA  Take 1 g by mouth every other day.     oxycodone 5 MG capsule  Commonly known as:  OXY-IR  Take 2 capsules (10 mg total) by mouth 3 (three) times daily as needed for pain.     pantoprazole 40 MG tablet  Commonly known as:  PROTONIX  Take 40 mg by mouth daily.  sevelamer carbonate 800 MG tablet  Commonly known as:  RENVELA  Take 800 mg by mouth 2 (two) times daily. Breakfast and dinner     Vitamin D (Ergocalciferol) 50000 UNITS Caps capsule  Commonly known as:  DRISDOL  Take 50,000 Units by mouth 3 (three) times a week. Approximately every other day       Allergies  Allergen Reactions  . Lialda [Mesalamine]   . Lisinopril     Informed by patient and family that medication causes kidney failure in patient and during last hospitalization was told by her cardiologist not to take this med.  . Morphine And Related Other (See Comments)    Made her loopy  . Phenergan [Promethazine Hcl] Other (See Comments)    Made her crazy  . Prednisone Other (See Comments)    Confusion, bad dreams, insomnia  . Trazodone And Nefazodone Other (See Comments)    hallucinations    Follow-up Information   Follow up with [facility].   Contact information:   [facility]       Follow up with Donato Schultz, MD. (september 11th Thursday better at 11:00pm)    Specialty:  Cardiology   Contact information:   [address] [place] [address] [phone]        The results of significant diagnostics from this hospitalization (including imaging, microbiology, ancillary and laboratory) are listed below for reference.    Significant Diagnostic Studies: Dg Chest 1 View  04/28/2013   CLINICAL DATA:  79 year old female status post thoracentesis on the right.  EXAM: CHEST - 1 VIEW  COMPARISON:  04/27/2013 and earlier.  FINDINGS: AP view at 1615 hrs. Decreased right pleural effusion. No pneumothorax. Moderately improved right lung ventilation. Stable cardiac size and  mediastinal contours. Visualized tracheal air column is within normal limits. Mildly lower lung volumes overall with mild left lung base atelectasis. Left chest cardiac pacemaker again noted.  IMPRESSION: Decreased right pleural effusion and no pneumothorax following thoracentesis. Mildly lower lung volumes.   Electronically Signed   By: Augusto Gamble   On: 04/28/2013 16:22   Dg Chest 2 View  05/10/2013   CLINICAL DATA:  79 year old female with pleural effusion.  EXAM: CHEST  2 VIEW  COMPARISON:  05/08/2013 and earlier.  FINDINGS: Seated AP and lateral views of the chest. Moderate right and small left pleural effusions have not significantly changed. Ventilation at the right lung base appears mildly decreased. Stable cardiac size and mediastinal contours. No pneumothorax. Pulmonary vascularity appears decreased. No overt edema. Stable visualized osseous structures. Left chest cardiac pacemaker.  IMPRESSION: All moderate right and small left pleural effusions not significantly changed. Mildly decreased ventilation at the right lung base. Interval decreased pulmonary vascular congestion.   Electronically Signed   By: Augusto Gamble M.D.   On: 05/10/2013 19:00   Dg Chest 2 View  05/08/2013   *RADIOLOGY REPORT*  Clinical Data: Recurrent pleural effusion.  Cough.  Short of breath.  CHEST - 2 VIEW  Comparison: 05/08/2013, 0851 hours.  Findings: Dual lead left subclavian pacemaker.  Cardiomegaly.  Low lung volumes.  Small to moderate bilateral pleural effusions and basilar atelectasis. There is pulmonary vascular congestion, interstitial and mild alveolar pulmonary edema.  No pneumothorax.  Compared to the exam earlier today, aeration is slightly improved, likely representing interval diuresis.  IMPRESSION: Moderate CHF.   Original Report Authenticated By: Andreas Newport, M.D.   Dg Chest 2 View  05/08/2013   *RADIOLOGY REPORT*  Clinical Data: Evaluate right-sided pleural  effusion after thoracentesis.  CHEST - 2 VIEW   Comparison: Chest x-ray 05/06/2013.  Findings: There is an unusual lucency both medially and laterally in the inferior right hemithorax on the frontal projection, which is suspicious for potential small pneumothorax, however, there is a right pleural effusion which is small, and this effusion does not appear to layer horizontally as would typically be seen in the setting of hydropneumothorax.  Small left pleural effusion is also noted.  Lung volumes are low, with bibasilar opacities which may reflect areas of atelectasis and/or consolidation.  Cephalization of the pulmonary vasculature, without frank pulmonary edema.  Heart size appears mildly enlarged.  Mediastinal contours are unremarkable.  Atherosclerosis of the thoracic aorta.  Left-sided pacemaker device with lead tips projecting over the expected location of the right atrium and right ventricular apex.  IMPRESSION: 1.  Unusual appearance of the right hemithorax, favored to reflect a some skin fold artifact.  Alternatively, a small right hydropneumothorax post thoracentesis is difficult to entirely exclude.  Attention on a follow-up study is suggested, preferably a standing PA and lateral chest radiograph to exclude pneumothorax. 2.  Small left pleural effusion. 3.  Cardiomegaly with pulmonary venous congestion, but no frank pulmonary edema. 4.  Bibasilar atelectasis and/or consolidation. 5.  Atherosclerosis.  These results were called by telephone on 05/08/2013 at 09:10 a.m. to nurse Lupita Leash (for Dr. Gala Romney), who verbally acknowledged these results.   Original Report Authenticated By: Trudie Reed, M.D.   Dg Chest 2 View  05/04/2013   *RADIOLOGY REPORT*  Clinical Data: Follow up pleural effusion.  CHEST - 2 VIEW  Comparison: 04/28/2013  Findings: Moderate to large right pleural effusion is now present, reaccumulated from the prior exam.  There is perihilar opacity on the right that is likely atelectasis.  There is a minimal left pleural effusion.   There is no overt pulmonary edema.  The cardiac silhouette is mildly enlarged.  There is no pneumothorax.  The left anterior chest wall sequential pacemaker is stable well- positioned.  IMPRESSION: Moderate to large right pleural effusion, which has reaccumulated since the prior chest radiograph.  There is associated right perihilar lung base atelectasis.  No overt pulmonary edema. Minimal left pleural effusion.   Original Report Authenticated By: Amie Portland, M.D.   Dg Chest 2 View  04/27/2013   *RADIOLOGY REPORT*  Clinical Data: Dyspnea, pleural effusion  CHEST - 2 VIEW  Comparison: 02/07/2013  Findings: Left subclavian pacemaker stable.  Cardiomegaly stable. There is a new moderate right pleural effusion with significant atelectasis/consolidation in the right lower lobe.  There may be tiny left pleural effusion atheromatous aortic arch. Perihilar interstitial infiltrates or edema.  IMPRESSION:  1.  New moderate right pleural effusion with right lower lung consolidation / atelectasis.   Original Report Authenticated By: D. Andria Rhein, MD   Ct Chest Wo Contrast  05/06/2013   *RADIOLOGY REPORT*  Clinical Data: status post thoracentesis, evaluate for cause of recurrent pleural effusion  CT CHEST WITHOUT CONTRAST  Technique:  Multidetector CT imaging of the chest was performed following the standard protocol without IV contrast.  Comparison: multiple recent chest radiographs  Findings: The study is limited without contrast.  There are small calcified left hilar lymph nodes.  There is coronary arterial and aortic arch calcification.  Cardiac pacer leads are identified with generator over the left thorax.  The heart is enlarged.  There is a small to moderate right pleural effusion.  There is a small left pleural effusion.  There is  no pneumothorax.  There is some dense material in consolidated posterior right lower lobe.  This could represent aspirated material.  Bilaterally there is dependent atelectasis.  On  the right, there is a focus of irregular hazy airspace disease in the upper lobe on image number 20, measuring 21 x 13 mm.  There is more diffuse ground-glass attenuation throughout the inferior right middle lobe and more severely in the right lower lobe.  On the left, and series 3 image number 26, there is a 1.4 cm nodular density posteriorly in the left lower lobe, but viewing this on axial, coronal, and sagittal images suggests that it is related to subsegmental atelectasis rather than a lung mass.  There is a calcified granuloma measuring 5 mm in the lingula.  There is a very mild ground-glass attenuation in the left upper lobe.  Scans through the uppermost abdomen and unremarkable.  IMPRESSION: Study is limited without contrast, but there is no specific abnormality to account for recurrent pleural effusion.  Effusions are bilateral, but larger on the right.  Dense material in consolidated right lower lobe suggests possibility of aspiration.  There is bilateral ground glass attenuation, but it is minimal on the left and much more prominent on the right.  This is nonspecific fidning, but given the asymmetry, it likely represents re-expansion pulmonary edema following thoracentesis.  Finally, there is an approximately 2cm focus of irregular hazy opacity in the right upper lobe, entirely nonspecific. Abnormalities such as atelectasis, focal edema/hemorrhage, or even adenocarcinoma can appear like this, and this should receive followup imaging to reassess.   Original Report Authenticated By: Esperanza Heir, M.D.   Dg Chest Port 1 View  05/06/2013   *RADIOLOGY REPORT*  Clinical Data: Status post right thoracentesis.  PORTABLE CHEST - 1 VIEW  Comparison: 05/04/2013  Findings: There is been significant reduction in the right-sided pleural effusion.  No pneumothorax is identified.  A lucency is noted over the right apex which is related to a skin fold.    A pacing device is again seen.  No other focal abnormality is  noted.  IMPRESSION: No pneumothorax following thoracentesis.  Significant reduction in right effusion is noted.   Original Report Authenticated By: Alcide Clever, M.D.   Ir Perc Pleural Drain W/indwell Cath W/img Guide  05/11/2013   *RADIOLOGY REPORT*  Clinical Data:  Recurrent symptomatic right-sided pleural effusion in patient with history of recurrent CHF, now seeking palliative therapy  INSERTION OF TUNNELED RIGHT SIDED PLEURAL DRAINAGE CATHETER  Comparison:  Ultrasound guided thoracentesis - 04/28/2013; chest radiograph - 05/10/2013; 05/08/2013; chest CT - 05/06/2013  Intravenous medications: Versed 2 mg IV; Fentanyl 75 mcg IV; Ancef 2 gram IV; Antibiotic was administered in an appropriate time interval for the procedure.  Total Moderate Sedation Time: 15 minutes.  Fluoroscopy time:  36 seconds  Complications:  None immediate  Procedure:  The procedure, risks, benefits, and alternatives were explained to the patient and the patient's daughter, who wish to proceed with the placement of this permanent pleural catheter as the patient is seeking palliative care.  The patient and the patient's wife understand and consent to the procedure.  The right lateral chest and right upper abdomen were prepped with Chlorhexidine in a sterile fashion, and a sterile drape was applied covering the operative field.  A sterile gown and sterile gloves were used for the procedure.  Initial ultrasound scanning and fluoroscopic imaging demonstrates a recurrent moderate to large right-sided pleural effusion.  Under direct ultrasound guidance, the right inferior  lateral pleural space was accessed with an 19 gauge trocar needle after the overlying soft tissues were anesthetized with 1% lidocaine with epinephrine. An Amplatz superstiff wire was then advanced under fluoroscopy into the pleural space.  A 16 French tunneled pleural catheter was tunneled from an incision within the right upper abdominal quadrant to the access site. The pleural  access site was serially dilated under fluoroscopy, ultimately allowing placement of a peel-away sheath.  The catheter was advanced through the peel-away sheath.  The sheath was then removed.  Final catheter positioning was confirmed with a fluoroscopic radiographic image.  The access incision was closed with subcutaneous subcuticular 4-0 Vicryl, Dermabond and Steri-Strips.  A Prolene retention suture was applied at the catheter exit site.  Large volume thoracentesis was performed through the new catheter utilizing provided bulb vacuum assisted drainage bag.  The patient tolerated the above procedure well without immediate postprocedural complication.  Findings:  The catheter was placed via the right lateral chest wall.  Catheter course is towards the apex.  Approximately 1.3 liters of serous pleural fluid was able to be removed after catheter placement.  IMPRESSION:  Successful placement of permanent, tunneled right pleural drainage catheter via lateral approach.  Approximately 1.3 liters of serous pleural fluid was removed after catheter placement.   Original Report Authenticated By: Tacey Ruiz, MD   US Thoracentesis Asp Pleural Space W/img Guide  04/28/2013   *RADIOLOGY REPORT*  Clinical Data:  Chronic kidney disease, CAD/CHF, right pleural effusion, dyspnea. Request is made for diagnostic/therapeutic right thoracentesis.  ULTRASOUND GUIDED DIAGNOSTIC AND THERAPEUTIC RIGHT THORACENTESIS  An ultrasound guided thoracentesis was thoroughly discussed with the patient and questions answered.  The benefits, risks, alternatives and complications were also discussed.  The patient understands and wishes to proceed with the procedure.  Written consent was obtained.  Ultrasound was performed to localize and mark an adequate pocket of fluid in the right chest.  The area was then prepped and draped in the normal sterile fashion.  1% Lidocaine was used for local anesthesia.  Under ultrasound guidance a 19 gauge Yueh  catheter was introduced.  Thoracentesis was performed.  The catheter was removed and a dressing applied.  Complications:  none  Findings: A total of approximately 1.5 liters of yellow fluid was removed. A fluid sample was sent for laboratory analysis.  IMPRESSION: Successful ultrasound guided diagnostic and therapeutic right thoracentesis yielding 1.5 liters of pleural fluid.  Read by: Hayes Ludwig.   Original Report Authenticated By: D. Andria Rhein, MD    Microbiology: Recent Results (from the past 240 hour(s))  BODY FLUID CULTURE     Status: None   Collection Time    05/06/13 12:27 PM      Result Value Range Status   Specimen Description FLUID RIGHT PLEURAL   Final   Special Requests NONE   Final   Gram Stain     Final   Value: CYTOSPIN WBC PRESENT,BOTH PMN AND MONONUCLEAR     NO ORGANISMS SEEN     Performed at [facility]   Culture     Final   Value: NO GROWTH 3 DAYS     Performed at [facility]   Report Status 05/09/2013 FINAL   Final     Labs: Basic Metabolic Panel:  Recent Labs Lab 05/12/13 0542 05/13/13 0358 05/14/13 0420 05/14/13 1426 05/15/13 0545  NA 136 135 131* 131* 130*  K 4.4 4.4 5.1 4.9 4.9  CL 90* 90* 88* 88* 88*  CO2 39* 38* 36* 36* 34*  GLUCOSE 197* 121* 230* 215* 146*  BUN 57* 60* 75* 76* 88*  CREATININE 1.65* 1.52* 2.41* 2.44* 2.75*  CALCIUM 8.3* 8.5 8.2* 8.0* 8.3*   Liver Function Tests: No results found for this basename: AST, ALT, ALKPHOS, BILITOT, PROT, ALBUMIN,  in the last 168 hours No results found for this basename: LIPASE, AMYLASE,  in the last 168 hours No results found for this basename: AMMONIA,  in the last 168 hours CBC: No results found for this basename: WBC, NEUTROABS, HGB, HCT, MCV, PLT,  in the last 168 hours Cardiac Enzymes: No results found for this basename: CKTOTAL, CKMB, CKMBINDEX, TROPONINI,  in the last 168 hours BNP: BNP (last 3 results)  Recent Labs  04/27/13 2019 05/06/13 0432  05/07/13 0430  PROBNP 6703.0* 4186.0* 4272.0*   CBG:  Recent Labs Lab 05/14/13 1117 05/14/13 1653 05/14/13 2141 05/15/13 0620 05/15/13 1216  GLUCAP 163* 180* 166* 155* 181*    Signed:  FELIZ ORTIZ, Zyan Coby  [facility] 05/15/2013, 2:52 PM

## 2013-05-15 NOTE — Progress Notes (Signed)
[facility] PROGRESS NOTE  Assessment/Plan: Acute on chronic systolic CHF (congestive heart failure) EF 35%: - Hold torsemide, Imdur add hydralazine - Cr increasing. D/c lisinopril, torsemide and give bolus 500cc - Strict I and O's.,  Decreased urine output. - decrease coreg.  - daughter in law very insisting on the pt going home today. - she was informed that with the pt complex condition along with the multi-organ dysfunction, she would be at high risk of coming back to the hospital or even dying. She relates she understands and she has d/w her family and they will still like to take the pt home. - we have made an appointment with her PCP on 9.9.2014 and her cardiologist 9.10.2014 to follow up.  A fib: - EKG showed afib,starting eloquis. - Currently rate controlled.  Pleural effusion - right due to Heart failure: - pulmonary consult rec pleurex cath. - 1.3 L from pleurex cath. - IR to provide instructions.  Acute on CKD (chronic kidney disease) stage 4, GFR 15-29 ml/min - baseline 1.8, increase to 2.4 with demadex, ACE and metolazone    Code Status: full Family Communication: none  Disposition Plan: inpatient   Consultants:  Cardiology  Pulmonary  Procedures:  CT chest  Pleurex cath.  Antibiotics:  None  HPI/Subjective: No complains  Objective: Filed Vitals:   05/14/13 1756 05/14/13 2032 05/15/13 0136 05/15/13 0612  BP: 112/56 92/50 99/52  104/55  Pulse:   71 72  Temp:    97.5 F (36.4 C)  TempSrc:    Oral  Resp:   18 18  Height:      Weight:    79.062 kg (174 lb 4.8 oz)  SpO2:   96% 97%    Intake/Output Summary (Last 24 hours) at 05/15/13 0809 Last data filed at 05/15/13 1610  Gross per 24 hour  Intake    610 ml  Output    626 ml  Net    -16 ml   Filed Weights   05/13/13 0622 05/14/13 0543 05/15/13 0612  Weight: 76.1 kg (167 lb 12.3 oz) 77.6 kg (171 lb 1.2 oz) 79.062 kg (174 lb 4.8 oz)    Exam:  General: Alert, awake, oriented x3,  in no acute distress.  HEENT: No bruits, no goiter.  Heart: Regular rate and rhythm, without murmurs, rubs, gallops.  Lungs: clear to auscultation, pleurex cath in place.    Data Reviewed: Basic Metabolic Panel:  Recent Labs Lab 05/12/13 0542 05/13/13 0358 05/14/13 0420 05/14/13 1426 05/15/13 0545  NA 136 135 131* 131* 130*  K 4.4 4.4 5.1 4.9 4.9  CL 90* 90* 88* 88* 88*  CO2 39* 38* 36* 36* 34*  GLUCOSE 197* 121* 230* 215* 146*  BUN 57* 60* 75* 76* 88*  CREATININE 1.65* 1.52* 2.41* 2.44* 2.75*  CALCIUM 8.3* 8.5 8.2* 8.0* 8.3*   Liver Function Tests: No results found for this basename: AST, ALT, ALKPHOS, BILITOT, PROT, ALBUMIN,  in the last 168 hours No results found for this basename: LIPASE, AMYLASE,  in the last 168 hours No results found for this basename: AMMONIA,  in the last 168 hours CBC: No results found for this basename: WBC, NEUTROABS, HGB, HCT, MCV, PLT,  in the last 168 hours Cardiac Enzymes: No results found for this basename: CKTOTAL, CKMB, CKMBINDEX, TROPONINI,  in the last 168 hours BNP (last 3 results)  Recent Labs  04/27/13 2019 05/06/13 0432 05/07/13 0430  PROBNP 6703.0* 4186.0* 4272.0*   CBG:  Recent Labs Lab 05/14/13 9604  05/14/13 1117 05/14/13 1653 05/14/13 2141 05/15/13 0620  GLUCAP 241* 163* 180* 166* 155*    Recent Results (from the past 240 hour(s))  BODY FLUID CULTURE     Status: None   Collection Time    05/06/13 12:27 PM      Result Value Range Status   Specimen Description FLUID RIGHT PLEURAL   Final   Special Requests NONE   Final   Gram Stain     Final   Value: CYTOSPIN WBC PRESENT,BOTH PMN AND MONONUCLEAR     NO ORGANISMS SEEN     Performed at [facility]   Culture     Final   Value: NO GROWTH 3 DAYS     Performed at [facility]   Report Status 05/09/2013 FINAL   Final     Studies: No results found.  Scheduled Meds: . allopurinol  100 mg Oral Daily  . ALPRAZolam  1 mg Oral QHS  . aspirin  EC  81 mg Oral QHS  . atorvastatin  40 mg Oral QHS  . carvedilol  3.125 mg Oral BID WC  . feeding supplement  237 mL Oral Q24H  . heparin  5,000 Units Subcutaneous Q8H  . insulin aspart  0-15 Units Subcutaneous TID WC  . insulin aspart  0-5 Units Subcutaneous QHS  . levothyroxine  125 mcg Oral QAC breakfast  . pantoprazole  40 mg Oral Daily  . polyethylene glycol  17 g Oral Daily  . sevelamer carbonate  800 mg Oral BID WC  . sodium chloride  500 mL Intravenous Once  . Vitamin D (Ergocalciferol)  50,000 Units Oral Q M,W,F   Continuous Infusions:    Marinda Elk  [facility] Pager [phone]. If 8PM-8AM, please contact night-coverage at [email or web address], password [facility] 05/15/2013, 8:09 AM  LOS: 11 days

## 2013-05-15 NOTE — Progress Notes (Signed)
Pt 's family member Sarah requesting to speak with Dr. Lemmie Evens.  MD informed via phone and instructed that she leave her phone number and will call her back.  Number obtained and will forward to MD.  Amanda Pea, RN.

## 2013-05-15 NOTE — Progress Notes (Signed)
Instructed by Dr. Felisa Bonier to leave iv out as iv team had attempted x2 and unsuccessful and unable to administered bolus as ordered.  MD aware.  Amanda Pea, Charity fundraiser.

## 2013-05-15 NOTE — Progress Notes (Signed)
Spoke with pt regarding pleurex cath drainage.  Stated that Home health nurse is suppose to be coming to her house to show her daughter how to do it.  Amanda Pea, Charity fundraiser.

## 2013-05-15 NOTE — Progress Notes (Signed)
Pt IV on Lt arm area infiltrated and IV team attempted x2 and unsuccessful and suggested central line or picc line if iv site needed.  Text message sent to Dr. Pricilla Holm.  Will continue to monitor.

## 2013-05-15 NOTE — Progress Notes (Signed)
All d/c instructions explained and given to pt and daughter Edward Jolly.  Verbalized understanding.  D/C off floor via w/c to awaiting transportation to home.  Amanda Pea, Charity fundraiser.

## 2013-05-15 NOTE — Progress Notes (Signed)
Physical Therapy Treatment Patient Details Name: Miranda Cruz MRN: [id number] DOB: 10-31-1929 Today's Date: 05/15/2013 Time: 0833-0900 PT Time Calculation (min): 27 min  PT Assessment / Plan / Recommendation  History of Present Illness 79 y.o. female with a Past Medical History of chronic systolic heart failure, ischemic cardiomyopathy, stage III-IV chronic kidney disease who presents today with the above noted complaint. Patient just was discharged from the hospital a few days ago, she claims upon discharge she was doing well, and felt that she was back to her baseline. This morning, patient fell that she was much more short of breath, and felt that if she did not seek medical attention she would get much worse. She went to her primary care practitioner's office, where a chest x-ray apparently demonstrated worsening of the pleural effusion, she was then referred to the hospitalist service as a direct admission. Pt had pleur  x catheter placed on 05/11/13.   PT Comments   Patient highly motivated. Eager to return home when medically ready. [place] is not present during the day   Follow Up Recommendations  Home health PT;Supervision - Intermittent     Does the patient have the potential to tolerate intense rehabilitation     Barriers to Discharge        Equipment Recommendations  None recommended by PT    Recommendations for Other Services    Frequency Min 3X/week   Progress towards PT Goals Progress towards PT goals: Progressing toward goals  Plan Current plan remains appropriate    Precautions / Restrictions Precautions Precautions: Fall   Pertinent Vitals/Pain No complaints of pain   Mobility  Bed Mobility Supine to Sit: 5: Supervision;With rails Sit to Supine: 4: Min guard Details for Bed Mobility Assistance: Patient able to lift her legs into bed this sessoin Transfers Sit to Stand: 5: Supervision;With upper extremity assist;With armrests;From chair/3-in-1;From bed Stand to  Sit: 5: Supervision;With upper extremity assist;With armrests;To bed;To chair/3-in-1 Ambulation/Gait Ambulation/Gait Assistance: 5: Supervision Ambulation Distance (Feet): 250 Feet Assistive device: Rolling walker Ambulation/Gait Assistance Details: slow but steady Gait Pattern: Step-through pattern;Decreased stride length Gait velocity: slow    Exercises     PT Diagnosis:    PT Problem List:   PT Treatment Interventions:     PT Goals (current goals can now be found in the care plan section)    Visit Information  Last PT Received On: 05/15/13 Assistance Needed: +1 History of Present Illness: 79 y.o. female with a Past Medical History of chronic systolic heart failure, ischemic cardiomyopathy, stage III-IV chronic kidney disease who presents today with the above noted complaint. Patient just was discharged from the hospital a few days ago, she claims upon discharge she was doing well, and felt that she was back to her baseline. This morning, patient fell that she was much more short of breath, and felt that if she did not seek medical attention she would get much worse. She went to her primary care practitioner's office, where a chest x-ray apparently demonstrated worsening of the pleural effusion, she was then referred to the hospitalist service as a direct admission. Pt had pleur  x catheter placed on 05/11/13.    Subjective Data      Cognition  Cognition Arousal/Alertness: Awake/alert Behavior During Therapy: WFL for tasks assessed/performed Overall Cognitive Status: Within Functional Limits for tasks assessed    Balance     End of Session PT - End of Session Equipment Utilized During Treatment: Gait belt Activity Tolerance: Patient tolerated treatment  well Patient left: in bed;with call bell/phone within reach Nurse Communication: Mobility status;Other (comment)   GP     Fredrich Birks 05/15/2013, 9:17 AM 05/15/2013 Fredrich Birks PTA [phone]  pager [phone] office

## 2013-05-15 NOTE — Progress Notes (Signed)
The patient's BP started out at 90/52 at the beginning of the night.  The nurse tech was asked to check the patient's VS throughout the night.  The patient's BP rose steady to 104/55 by morning.  She stated that she slept well after receiving Tylenol for pain.  She still has a lot of bruising in her abdominal area and has a small amount of old drainage on both bandages.

## 2013-05-15 NOTE — Progress Notes (Signed)
Subjective:  Talked about loss of dog. Sad. Still with congestion/cough. Minimal pain right shoulder and cath site.   Objective:  Vital Signs in the last 24 hours: Temp:  [97.5 F (36.4 C)-97.7 F (36.5 C)] 97.5 F (36.4 C) (09/08 0612) Pulse Rate:  [70-72] 72 (09/08 0612) Resp:  [18] 18 (09/08 0612) BP: (63-112)/(32-64) 104/55 mmHg (09/08 0612) SpO2:  [92 %-98 %] 97 % (09/08 0612) Weight:  [79.062 kg (174 lb 4.8 oz)] 79.062 kg (174 lb 4.8 oz) (09/08 0612)  Intake/Output from previous day: 09/07 0701 - 09/08 0700 In: 610 [P.O.:360; IV Piggyback:250] Out: 626 [Urine:625; Stool:1]   Physical Exam: General: Well developed, well nourished, in no acute distress. Head:  Normocephalic and atraumatic. Lungs: Decreased bases., cath Heart: Normal S1 and S2.  No murmur, rubs or gallops.  Abdomen: soft, non-tender, positive bowel sounds. Extremities: No clubbing or cyanosis. No edema. Neurologic: Alert and oriented x 3.    Lab Results: No results found for this basename: WBC, HGB, PLT,  in the last 72 hours  Recent Labs  05/14/13 1426 05/15/13 0545  NA 131* 130*  K 4.9 4.9  CL 88* 88*  CO2 36* 34*  GLUCOSE 215* 146*  BUN 76* 88*  CREATININE 2.44* 2.75*    Telemetry: AFIB, V paced Personally viewed.   Assessment/Plan:  Principal Problem:   Acute on chronic systolic CHF (congestive heart failure) Active Problems:   CKD (chronic kidney disease) stage 4, GFR 15-29 ml/min   Cardiomyopathy, ischemic   CAD (coronary artery disease)   Encephalopathy acute   Pleural effusion - right   Cardiac pacemaker in situ   1) Acute on chronic renal failure with underlying CKD 4  - giving back another IVF 250 bolus  - stopped all medications related to BP, diuresis. Except decreased coreg to 3.125mg .   - Discussed with Dr. Robb Matar.  2) Recurrent effusions  - has questions about Pleurx.  - discussed with Dr. Robb Matar  - IR to discuss  3) Chronic systolic HF  - challenging.  -  Decreased overall prognosis  4) Pacer  5) AFIB  - Eliquis stopped because of creat.  Shmiel Morton 05/15/2013, 8:39 AM

## 2013-05-22 ENCOUNTER — Emergency Department (HOSPITAL_COMMUNITY)
Admission: EM | Admit: 2013-05-22 | Discharge: 2013-05-22 | Disposition: A | Discharge status: Home / Self Care | Payer: Medicare Other | Attending: Emergency Medicine | Admitting: Emergency Medicine

## 2013-05-22 ENCOUNTER — Emergency Department (HOSPITAL_COMMUNITY): Payer: Medicare Other

## 2013-05-22 ENCOUNTER — Encounter (HOSPITAL_COMMUNITY): Payer: Self-pay | Admitting: *Deleted

## 2013-05-22 DIAGNOSIS — I509 Heart failure, unspecified: Secondary | ICD-10-CM | POA: Insufficient documentation

## 2013-05-22 DIAGNOSIS — M545 Low back pain, unspecified: Secondary | ICD-10-CM | POA: Insufficient documentation

## 2013-05-22 DIAGNOSIS — Z885 Allergy status to narcotic agent status: Secondary | ICD-10-CM | POA: Insufficient documentation

## 2013-05-22 DIAGNOSIS — J90 Pleural effusion, not elsewhere classified: Secondary | ICD-10-CM

## 2013-05-22 DIAGNOSIS — M129 Arthropathy, unspecified: Secondary | ICD-10-CM | POA: Insufficient documentation

## 2013-05-22 DIAGNOSIS — I252 Old myocardial infarction: Secondary | ICD-10-CM | POA: Insufficient documentation

## 2013-05-22 DIAGNOSIS — E119 Type 2 diabetes mellitus without complications: Secondary | ICD-10-CM | POA: Insufficient documentation

## 2013-05-22 DIAGNOSIS — Z8701 Personal history of pneumonia (recurrent): Secondary | ICD-10-CM | POA: Insufficient documentation

## 2013-05-22 DIAGNOSIS — Z79899 Other long term (current) drug therapy: Secondary | ICD-10-CM | POA: Insufficient documentation

## 2013-05-22 DIAGNOSIS — Z888 Allergy status to other drugs, medicaments and biological substances status: Secondary | ICD-10-CM | POA: Insufficient documentation

## 2013-05-22 DIAGNOSIS — E7800 Pure hypercholesterolemia, unspecified: Secondary | ICD-10-CM | POA: Insufficient documentation

## 2013-05-22 DIAGNOSIS — Z438 Encounter for attention to other artificial openings: Secondary | ICD-10-CM | POA: Insufficient documentation

## 2013-05-22 DIAGNOSIS — M109 Gout, unspecified: Secondary | ICD-10-CM | POA: Insufficient documentation

## 2013-05-22 DIAGNOSIS — R279 Unspecified lack of coordination: Secondary | ICD-10-CM | POA: Insufficient documentation

## 2013-05-22 DIAGNOSIS — R339 Retention of urine, unspecified: Secondary | ICD-10-CM | POA: Insufficient documentation

## 2013-05-22 DIAGNOSIS — E039 Hypothyroidism, unspecified: Secondary | ICD-10-CM | POA: Insufficient documentation

## 2013-05-22 DIAGNOSIS — N184 Chronic kidney disease, stage 4 (severe): Secondary | ICD-10-CM | POA: Insufficient documentation

## 2013-05-22 DIAGNOSIS — I129 Hypertensive chronic kidney disease with stage 1 through stage 4 chronic kidney disease, or unspecified chronic kidney disease: Secondary | ICD-10-CM | POA: Insufficient documentation

## 2013-05-22 DIAGNOSIS — G8929 Other chronic pain: Secondary | ICD-10-CM | POA: Insufficient documentation

## 2013-05-22 DIAGNOSIS — Z7982 Long term (current) use of aspirin: Secondary | ICD-10-CM | POA: Insufficient documentation

## 2013-05-22 LAB — PRO B NATRIURETIC PEPTIDE: Pro B Natriuretic peptide (BNP): 11291 pg/mL — ABNORMAL HIGH (ref 0–450)

## 2013-05-22 LAB — URINALYSIS, ROUTINE W REFLEX MICROSCOPIC
Bilirubin Urine: NEGATIVE
Glucose, UA: NEGATIVE mg/dL
Hgb urine dipstick: NEGATIVE
Nitrite: NEGATIVE
Specific Gravity, Urine: 1.018 (ref 1.005–1.030)
pH: 7.5 (ref 5.0–8.0)

## 2013-05-22 LAB — COMPREHENSIVE METABOLIC PANEL
AST: 42 U/L — ABNORMAL HIGH (ref 0–37)
Albumin: 2.4 g/dL — ABNORMAL LOW (ref 3.5–5.2)
BUN: 48 mg/dL — ABNORMAL HIGH (ref 6–23)
Creatinine, Ser: 1.36 mg/dL — ABNORMAL HIGH (ref 0.50–1.10)
Total Protein: 6.2 g/dL (ref 6.0–8.3)

## 2013-05-22 LAB — URINE MICROSCOPIC-ADD ON

## 2013-05-22 LAB — CBC
HCT: 38.5 % (ref 36.0–46.0)
Hemoglobin: 12.5 g/dL (ref 12.0–15.0)
MCV: 92.3 fL (ref 78.0–100.0)
RBC: 4.17 MIL/uL (ref 3.87–5.11)
WBC: 8.9 10*3/uL (ref 4.0–10.5)

## 2013-05-22 LAB — POCT I-STAT TROPONIN I

## 2013-05-22 NOTE — ED Provider Notes (Addendum)
CSN: [id number]     Arrival date & time 05/22/13  1203 History   First MD Initiated Contact with Patient 05/22/13 1247     Chief Complaint  Patient presents with  . Shortness of Breath  . Urinary Retention    HPI Pt has history of recurrent pleural effusion and has an indwelling thoracentesis catheter.  She was released from the hospital this past week.  Home health has been to assist with management of the catheter at home.  This past weekend she noticed decreased drainage from the catheter.  Family has also noticed decreased urine output over the weekend and weight gain.  No chest pain.  Shortness of breath is worsening.   Patient also has noticed decreased urine output. She does not feel like her bladder is full or distended but just has not been urinating as much as usual. Patient denies any nausea vomiting fevers or abdominal pain Past Medical History  Diagnosis Date  . Myocardial infarction 1995  . CHF (congestive heart failure)   . Pacemaker   . Hypertension   . High cholesterol   . Pleural effusion on right 04/27/2013    Miranda Cruz 04/27/2013 (04/27/2013)  . Chronic kidney disease (CKD), stage IV (severe)     Miranda Cruz 04/27/2013 (04/27/2013)  . Pneumonia 1948; 1954; 02/2013; ?04/27/2013    "real bad the first 2 times"; don't remember how bad in 02/2013; might have it now"  (04/27/2013)  . Exertional shortness of breath     "for the past week" (04/27/2013)  . Hypothyroidism   . Type II diabetes mellitus   . History of blood transfusion 2010    "w/hip replacement" (04/27/2013)  . Arthritis     "back; hands" (04/27/2013)  . Chronic lower back pain   . Gout     "several times; left ankle; fingers" (04/27/2013)   Past Surgical History  Procedure Laterality Date  . Coronary angioplasty  1995  . Coronary angioplasty with stent placement  2000    "?1" (04/27/2013)  . Abdominal wall mesh  removal  07/2007    "took the part out that was infected" (04/27/2013)  . Total hip arthroplasty Left 2009   . Total hip arthroplasty Right 2010  . Insert / replace / remove pacemaker  ~ 2011  . Hernia repair  06/2007    "umbilical hernia repair" (04/27/2013)  . Cataract extraction w/ intraocular lens implant Right 05/2012  . Joint replacement    . Thoracentesis Right 04/28/2013   No family history on file. History  Substance Use Topics  . Smoking status: Never Smoker   . Smokeless tobacco: Never Used  . Alcohol Use: No   OB History   Grav Para Term Preterm Abortions TAB SAB Ect Mult Living                 Review of Systems  All other systems reviewed and are negative.    Allergies  Lialda; Lisinopril; Morphine and related; Phenergan; Prednisone; and Trazodone and nefazodone  Home Medications   Current Outpatient Rx  Name  Route  Sig  Dispense  Refill  . acetaminophen (TYLENOL) 500 MG tablet   Oral   Take 1,000 mg by mouth daily as needed for pain.         Marland Kitchen allopurinol (ZYLOPRIM) 100 MG tablet   Oral   Take 100 mg by mouth every morning.          Marland Kitchen ALPRAZolam (XANAX) 1 MG tablet   Oral   Take  1 mg by mouth at bedtime.         Marland Kitchen aspirin EC 81 MG tablet   Oral   Take 81 mg by mouth at bedtime.         Marland Kitchen atorvastatin (LIPITOR) 40 MG tablet   Oral   Take 40 mg by mouth at bedtime.          . carvedilol (COREG) 3.125 MG tablet   Oral   Take 1 tablet (3.125 mg total) by mouth 2 (two) times daily with a meal.   60 tablet   0   . fluticasone (FLONASE) 50 MCG/ACT nasal spray   Nasal   Place 2 sprays into the nose 2 (two) times daily as needed for rhinitis or allergies.         Marland Kitchen glyBURIDE (DIABETA) 2.5 MG tablet   Oral   Take 1.25 mg by mouth daily as needed (if sugar is over 120).         Marland Kitchen guaiFENesin (MUCINEX) 600 MG 12 hr tablet   Oral   Take 1 tablet (600 mg total) by mouth 2 (two) times daily.   60 tablet   0   . levalbuterol (XOPENEX HFA) 45 MCG/ACT inhaler   Inhalation   Inhale 1-2 puffs into the lungs 3 (three) times daily as needed for  wheezing or shortness of breath.         . levothyroxine (SYNTHROID, LEVOTHROID) 125 MCG tablet   Oral   Take 125 mcg by mouth daily before breakfast.          . Menthol, Topical Analgesic, (BENGAY EX)   Apply externally   Apply 1 application topically daily as needed (hand pain).         . nitroGLYCERIN (NITROSTAT) 0.4 MG SL tablet   Sublingual   Place 0.4 mg under the tongue as needed for chest pain. x3 doses as needed for chest pain         . omega-3 acid ethyl esters (LOVAZA) 1 G capsule   Oral   Take 1 g by mouth every other day.          Marland Kitchen oxycodone (OXY-IR) 5 MG capsule   Oral   Take 2 capsules (10 mg total) by mouth 3 (three) times daily as needed for pain.   30 capsule   0   . pantoprazole (PROTONIX) 40 MG tablet   Oral   Take 40 mg by mouth daily.         . Pseudoeph-Doxylamine-DM-APAP (NYQUIL PO)   Oral   Take by mouth at bedtime as needed (cough). Small capful         . Pseudoephedrine-APAP-DM (DAYQUIL PO)   Oral   Take by mouth daily as needed (cough). Small capful         . sevelamer carbonate (RENVELA) 800 MG tablet   Oral   Take 800 mg by mouth 2 (two) times daily. Breakfast and dinner         . Vitamin D, Ergocalciferol, (DRISDOL) 50000 UNITS CAPS   Oral   Take 50,000 Units by mouth 3 (three) times a week. Approximately every other day          BP 102/74  Pulse 70  Temp(Src) 97.9 F (36.6 C)  Resp 19  Ht 5\' 4"  (1.626 m)  Wt 177 lb 14.4 oz (80.695 kg)  BMI 30.52 kg/m2  SpO2 99% Physical Exam  Nursing note and vitals reviewed. Constitutional: No distress.  HENT:  Head: Normocephalic and atraumatic.  Right Ear: External ear normal.  Left Ear: External ear normal.  Eyes: Conjunctivae are normal. Right eye exhibits no discharge. Left eye exhibits no discharge. No scleral icterus.  Neck: Neck supple. No tracheal deviation present.  Cardiovascular: Normal rate, regular rhythm and intact distal pulses.   Pulmonary/Chest:  Effort normal. No stridor. No respiratory distress. She has no wheezes. She has rales (bilateral bases).  Catheter noted right lower anterior chest wall, no surrounding erythema, serous fluid with debris within the catheter lumen   Abdominal: Soft. Bowel sounds are normal. She exhibits no distension. There is no tenderness. There is no rebound and no guarding.  Musculoskeletal: She exhibits no edema and no tenderness.  Neurological: She is alert. She has normal strength. No sensory deficit. Cranial nerve deficit:  no gross defecits noted. She exhibits normal muscle tone. She displays no seizure activity. Coordination normal.  Skin: Skin is warm and dry. No rash noted.  Psychiatric: She has a normal mood and affect.    ED Course  Procedures (including critical care time) Labs Review Labs Reviewed  PRO B NATRIURETIC PEPTIDE - Abnormal; Notable for the following:    Pro B Natriuretic peptide (BNP) 11291.0 (*)    All other components within normal limits  COMPREHENSIVE METABOLIC PANEL - Abnormal; Notable for the following:    Glucose, Bld 121 (*)    BUN 48 (*)    Creatinine, Ser 1.36 (*)    Albumin 2.4 (*)    AST 42 (*)    Alkaline Phosphatase 138 (*)    GFR calc non Af Amer 36 (*)    GFR calc Af Amer 41 (*)    All other components within normal limits  CBC  URINALYSIS, ROUTINE W REFLEX MICROSCOPIC   Imaging Review Dg Chest 2 View  05/22/2013   CLINICAL DATA:  Shortness of breath, cough, congestion. Pleural fluid, drain in place.  EXAM: CHEST  2 VIEW  COMPARISON:  05/10/2013  FINDINGS: Left pacer is in place. Cardiomegaly with vascular congestion. Suspect mild interstitial edema. Bibasilar atelectasis. Small left pleural effusion, increased since prior study. Right pleural drain in place without significant right effusion or pneumothorax.  IMPRESSION: Small left pleural effusion. Right pleural drain in place without visible significant effusion or pneumothorax.  Cardiomegaly, bibasilar  atelectasis. Suspect mild pulmonary edema.   Electronically Signed   By: Charlett Nose M.D.   On: 05/22/2013 13:42   Dg Chest Right Decubitus  05/22/2013   CLINICAL DATA:  Pleural fluid, drain.  EXAM: CHEST - RIGHT DECUBITUS  COMPARISON:  Chest x-ray performed today and 05/10/2013  FINDINGS: A right side down decubitus view demonstrates moderate layering right pleural effusion. Right pleural drain in place.  IMPRESSION: Moderate layering right pleural effusion.   Electronically Signed   By: Charlett Nose M.D.   On: 05/22/2013 13:42   Pleurovac was hooked up to the patient's drawn.  425 ml of amber fluids was drained.  Pt felt significantly better.  Surgical ICU nurse assisted [place] in the use of the pleurovac.  Family was instructed as well.  MDM   1. Pleural effusion     Improved after thoracentesis.  Plan is to check her laboratory tests. The weight is not significantly changed from her baseline. Plan will be to discharge to home assuming her labs are not significantly changed, this patient is feeling significantly better at this time.    Celene Kras, MD 05/22/13 [phone]  Discussed findings with family.  They would like to go home.  Pt is feeling better.  She has follow up with Dr [name] this week.   Renal function is improving.  BNP is more elevated but CXR is improving.  Plan to continue her current medications.  Will monitor weight closely.  Celene Kras, MD 05/22/13 [phone]

## 2013-05-22 NOTE — ED Notes (Addendum)
200 ml found via bladder scan.

## 2013-05-22 NOTE — ED Notes (Addendum)
Pleurex catheter drained used Pleurex drainage system with Edilia Bo, RN (from ICU). Cap from kit did not fit and X-ray was consulted as they put in the original catheter. Xray repaired catheter. 425 ml of amber fluid was drained from the catheter. Dressing applied from kit. Drainsponge, gauze, and tegaderm applied per protocol.

## 2013-05-22 NOTE — ED Notes (Addendum)
Consulted with ICU nurse for type of drainage device to be used on pt's pulmonary indwelling catheter. ICU stated she will come down and look at herself.

## 2013-05-22 NOTE — ED Notes (Signed)
Pt with hx of pleurex catheter placed 1 week prior for pleural effusions.  Pt has been getting increasingly sob since yesterday.  Pt also has been having decreased urine output since Friday (MD recently d/c'd her lasix d/t reduced kidney function per pt).  Able to speak in full sentences.  Sats of 95% on room air.

## 2013-05-24 ENCOUNTER — Telehealth: Payer: Self-pay | Admitting: Pulmonary Disease

## 2013-05-24 NOTE — Telephone Encounter (Signed)
I am happy to see pt, but will need to be in a slot.  I have never seen her, and has had a very complicated hospitalization.

## 2013-05-24 NOTE — Telephone Encounter (Signed)
Dr. Delton Coombes since you have seen pt in the hospital and [facility] has no 30 min openings please advise if pt can be worked in your schedule thanks

## 2013-05-24 NOTE — Telephone Encounter (Signed)
She can have a 30 min slot, can't be overbooked in. Thanks

## 2013-05-24 NOTE — Telephone Encounter (Signed)
Dr. Vicente Males office is currently closed.  They close at 5 pm. Dr. Delton Coombes has a 30 minute slot opening on Friday, Sept 26 at 11:45 am.  I have held this slot until we can speak with Amy tomorrow to see if this date will work.

## 2013-05-24 NOTE — Telephone Encounter (Signed)
I spoke with Miranda Cruz. She stated Dr. Felipa Eth wants pt seen either this week or next week. Dr. Felipa Eth does not want pt to see MW. Was not giving reason why just stated any one but MW. No other physicians have any openings not even next week. Pt was seen the hospital by RB, CDY, and Dr. Sung Amabile 05/04/13  KC you have a 15 min spot open tomorrow at 9:15. Could pt be worked in then? Please advise thanks

## 2013-05-24 NOTE — Telephone Encounter (Signed)
Pt was seen by Pulm drs in the hospital.  Are they wanting this appt only with RA or can it be with another provider? [email or web address] for Amy

## 2013-05-25 NOTE — Telephone Encounter (Signed)
Spoke with Amy--  appt has been scheduled for Fri Sept 26 @ 1145am Amy stated she will contact patient to inform of this Nothing further needed at this time

## 2013-05-29 ENCOUNTER — Emergency Department (HOSPITAL_COMMUNITY): Payer: Medicare Other

## 2013-05-29 ENCOUNTER — Inpatient Hospital Stay (HOSPITAL_COMMUNITY)
Admission: EM | Admit: 2013-05-29 | Discharge: 2013-06-08 | DRG: 291 | Disposition: A | Discharge status: Home / Self Care | Payer: Medicare Other | Attending: Internal Medicine | Admitting: Internal Medicine

## 2013-05-29 ENCOUNTER — Encounter (HOSPITAL_COMMUNITY): Payer: Self-pay | Admitting: Emergency Medicine

## 2013-05-29 DIAGNOSIS — I959 Hypotension, unspecified: Secondary | ICD-10-CM

## 2013-05-29 DIAGNOSIS — I4891 Unspecified atrial fibrillation: Secondary | ICD-10-CM | POA: Diagnosis present

## 2013-05-29 DIAGNOSIS — E871 Hypo-osmolality and hyponatremia: Secondary | ICD-10-CM

## 2013-05-29 DIAGNOSIS — E875 Hyperkalemia: Secondary | ICD-10-CM

## 2013-05-29 DIAGNOSIS — J9601 Acute respiratory failure with hypoxia: Secondary | ICD-10-CM | POA: Diagnosis present

## 2013-05-29 DIAGNOSIS — N179 Acute kidney failure, unspecified: Secondary | ICD-10-CM

## 2013-05-29 DIAGNOSIS — E872 Acidosis, unspecified: Secondary | ICD-10-CM | POA: Diagnosis present

## 2013-05-29 DIAGNOSIS — E7800 Pure hypercholesterolemia, unspecified: Secondary | ICD-10-CM | POA: Diagnosis present

## 2013-05-29 DIAGNOSIS — I255 Ischemic cardiomyopathy: Secondary | ICD-10-CM

## 2013-05-29 DIAGNOSIS — H9190 Unspecified hearing loss, unspecified ear: Secondary | ICD-10-CM | POA: Diagnosis not present

## 2013-05-29 DIAGNOSIS — E8770 Fluid overload, unspecified: Secondary | ICD-10-CM

## 2013-05-29 DIAGNOSIS — E119 Type 2 diabetes mellitus without complications: Secondary | ICD-10-CM | POA: Diagnosis present

## 2013-05-29 DIAGNOSIS — L680 Hirsutism: Secondary | ICD-10-CM | POA: Diagnosis present

## 2013-05-29 DIAGNOSIS — E039 Hypothyroidism, unspecified: Secondary | ICD-10-CM | POA: Diagnosis present

## 2013-05-29 DIAGNOSIS — J9620 Acute and chronic respiratory failure, unspecified whether with hypoxia or hypercapnia: Secondary | ICD-10-CM | POA: Diagnosis present

## 2013-05-29 DIAGNOSIS — Z950 Presence of cardiac pacemaker: Secondary | ICD-10-CM

## 2013-05-29 DIAGNOSIS — R042 Hemoptysis: Secondary | ICD-10-CM

## 2013-05-29 DIAGNOSIS — Z515 Encounter for palliative care: Secondary | ICD-10-CM

## 2013-05-29 DIAGNOSIS — J90 Pleural effusion, not elsewhere classified: Secondary | ICD-10-CM | POA: Diagnosis present

## 2013-05-29 DIAGNOSIS — J189 Pneumonia, unspecified organism: Secondary | ICD-10-CM

## 2013-05-29 DIAGNOSIS — I5023 Acute on chronic systolic (congestive) heart failure: Principal | ICD-10-CM | POA: Diagnosis present

## 2013-05-29 DIAGNOSIS — D631 Anemia in chronic kidney disease: Secondary | ICD-10-CM | POA: Diagnosis present

## 2013-05-29 DIAGNOSIS — E876 Hypokalemia: Secondary | ICD-10-CM | POA: Diagnosis not present

## 2013-05-29 DIAGNOSIS — R5381 Other malaise: Secondary | ICD-10-CM

## 2013-05-29 DIAGNOSIS — I2510 Atherosclerotic heart disease of native coronary artery without angina pectoris: Secondary | ICD-10-CM

## 2013-05-29 DIAGNOSIS — Z96649 Presence of unspecified artificial hip joint: Secondary | ICD-10-CM

## 2013-05-29 DIAGNOSIS — I130 Hypertensive heart and chronic kidney disease with heart failure and stage 1 through stage 4 chronic kidney disease, or unspecified chronic kidney disease: Secondary | ICD-10-CM | POA: Diagnosis present

## 2013-05-29 DIAGNOSIS — I1310 Hypertensive heart and chronic kidney disease without heart failure, with stage 1 through stage 4 chronic kidney disease, or unspecified chronic kidney disease: Secondary | ICD-10-CM

## 2013-05-29 DIAGNOSIS — R0902 Hypoxemia: Secondary | ICD-10-CM

## 2013-05-29 DIAGNOSIS — J9819 Other pulmonary collapse: Secondary | ICD-10-CM | POA: Diagnosis present

## 2013-05-29 DIAGNOSIS — G9340 Encephalopathy, unspecified: Secondary | ICD-10-CM

## 2013-05-29 DIAGNOSIS — G8929 Other chronic pain: Secondary | ICD-10-CM | POA: Diagnosis present

## 2013-05-29 DIAGNOSIS — N184 Chronic kidney disease, stage 4 (severe): Secondary | ICD-10-CM | POA: Diagnosis present

## 2013-05-29 LAB — GLUCOSE, CAPILLARY: Glucose-Capillary: 145 mg/dL — ABNORMAL HIGH (ref 70–99)

## 2013-05-29 LAB — CBC
HCT: 37.8 % (ref 36.0–46.0)
MCH: 29.6 pg (ref 26.0–34.0)
MCHC: 31.7 g/dL (ref 30.0–36.0)
Platelets: 259 10*3/uL (ref 150–400)
RDW: 15.5 % (ref 11.5–15.5)
WBC: 8.2 10*3/uL (ref 4.0–10.5)

## 2013-05-29 LAB — BASIC METABOLIC PANEL
Calcium: 8.6 mg/dL (ref 8.4–10.5)
Chloride: 103 mEq/L (ref 96–112)
GFR calc Af Amer: 41 mL/min — ABNORMAL LOW (ref >90)
GFR calc non Af Amer: 36 mL/min — ABNORMAL LOW (ref >90)
Glucose, Bld: 155 mg/dL — ABNORMAL HIGH (ref 70–99)
Sodium: 139 mEq/L (ref 135–145)

## 2013-05-29 LAB — POCT I-STAT TROPONIN I: Troponin i, poc: 0.02 ng/mL (ref 0.00–0.08)

## 2013-05-29 LAB — PRO B NATRIURETIC PEPTIDE: Pro B Natriuretic peptide (BNP): 9064 pg/mL — ABNORMAL HIGH (ref 0–450)

## 2013-05-29 NOTE — ED Notes (Signed)
Attempted IV access x 2. IV team paged.  

## 2013-05-29 NOTE — ED Provider Notes (Signed)
CSN: [id number]     Arrival date & time 05/29/13  1922 History   First MD Initiated Contact with Patient 05/29/13 2054     Chief Complaint  Patient presents with  . Shortness of Breath   (Consider location/radiation/quality/duration/timing/severity/associated sxs/prior Treatment) HPI Comments: Patient with history of congestive heart failure, right-sided Aspira pleural drainage cath in place, returns to the emergency department with shortness of breath, hypoxia, worsening lower extremity edema. Symptoms are similar to when she was in emergency department last week. At that time almost 500 cc of fluid was drained from thoracentesis catheter and patient was discharged to home. Family states that the patient has taken 120 mg of Lasix today without any urinary output. They also state that the patient's peripheral edema and periorbital edema is worse than usual. Patient was able to speak in complete sentences yesterday whereas she cannot today. No fever. She has spells of coughing. Onset of symptoms gradual. Course is gradually worsening. Nothing makes symptoms better or worse.  Patient is a 79 y.o. female presenting with shortness of breath. The history is provided by medical records, the patient and a relative.  Shortness of Breath Associated symptoms: cough   Associated symptoms: no abdominal pain, no chest pain, no fever, no headaches, no rash, no sore throat and no vomiting     Past Medical History  Diagnosis Date  . Myocardial infarction 1995  . CHF (congestive heart failure)   . Pacemaker   . Hypertension   . High cholesterol   . Pleural effusion on right 04/27/2013    [name] 04/27/2013 (04/27/2013)  . Chronic kidney disease (CKD), stage IV (severe)     [name] 04/27/2013 (04/27/2013)  . Pneumonia 1948; 1954; 02/2013; ?04/27/2013    "real bad the first 2 times"; don't remember how bad in 02/2013; might have it now"  (04/27/2013)  . Exertional shortness of breath     "for the past week"  (04/27/2013)  . Hypothyroidism   . Type II diabetes mellitus   . History of blood transfusion 2010    "w/hip replacement" (04/27/2013)  . Arthritis     "back; hands" (04/27/2013)  . Chronic lower back pain   . Gout     "several times; left ankle; fingers" (04/27/2013)   Past Surgical History  Procedure Laterality Date  . Coronary angioplasty  1995  . Coronary angioplasty with stent placement  2000    "?1" (04/27/2013)  . Abdominal wall mesh  removal  07/2007    "took the part out that was infected" (04/27/2013)  . Total hip arthroplasty Left 2009  . Total hip arthroplasty Right 2010  . Insert / replace / remove pacemaker  ~ 2011  . Hernia repair  06/2007    "umbilical hernia repair" (04/27/2013)  . Cataract extraction w/ intraocular lens implant Right 05/2012  . Joint replacement    . Thoracentesis Right 04/28/2013   No family history on file. History  Substance Use Topics  . Smoking status: Never Smoker   . Smokeless tobacco: Never Used  . Alcohol Use: No   OB History   Grav Para Term Preterm Abortions TAB SAB Ect Mult Living                 Review of Systems  Constitutional: Negative for fever and chills.  HENT: Negative for sore throat and rhinorrhea.   Eyes: Negative for redness.  Respiratory: Positive for cough and shortness of breath.   Cardiovascular: Positive for leg swelling. Negative for chest pain.  Gastrointestinal: Negative for nausea, vomiting, abdominal pain and diarrhea.  Genitourinary: Negative for dysuria.  Musculoskeletal: Negative for myalgias.  Skin: Negative for rash.  Neurological: Negative for headaches.    Allergies  Lisinopril; Morphine and related; Phenergan; Prednisone; and Trazodone and nefazodone  Home Medications   Current Outpatient Rx  Name  Route  Sig  Dispense  Refill  . acetaminophen (TYLENOL) 500 MG tablet   Oral   Take 1,000 mg by mouth daily as needed for pain.         Marland Kitchen allopurinol (ZYLOPRIM) 100 MG tablet   Oral   Take  100 mg by mouth every morning.          Marland Kitchen ALPRAZolam (XANAX) 1 MG tablet   Oral   Take 1 mg by mouth at bedtime.         Marland Kitchen aspirin EC 81 MG tablet   Oral   Take 81 mg by mouth at bedtime.         Marland Kitchen atorvastatin (LIPITOR) 40 MG tablet   Oral   Take 40 mg by mouth at bedtime.          . carvedilol (COREG) 3.125 MG tablet   Oral   Take 1 tablet (3.125 mg total) by mouth 2 (two) times daily with a meal.   60 tablet   0   . fluticasone (FLONASE) 50 MCG/ACT nasal spray   Nasal   Place 2 sprays into the nose 2 (two) times daily as needed for rhinitis or allergies.         . furosemide (LASIX) 40 MG tablet   Oral   Take 40 mg by mouth 2 (two) times daily.         Marland Kitchen glyBURIDE (DIABETA) 2.5 MG tablet   Oral   Take 1.25 mg by mouth daily as needed (if sugar is over 120).         Marland Kitchen guaiFENesin (MUCINEX) 600 MG 12 hr tablet   Oral   Take 1 tablet (600 mg total) by mouth 2 (two) times daily.   60 tablet   0   . levalbuterol (XOPENEX HFA) 45 MCG/ACT inhaler   Inhalation   Inhale 1-2 puffs into the lungs 3 (three) times daily as needed for wheezing or shortness of breath.         . levothyroxine (SYNTHROID, LEVOTHROID) 125 MCG tablet   Oral   Take 125 mcg by mouth daily before breakfast.          . omega-3 acid ethyl esters (LOVAZA) 1 G capsule   Oral   Take 1 g by mouth See admin instructions. Take 1 capsule (1 g) daily, take a 2nd capsule if needed for constipation         . oxyCODONE (OXY IR/ROXICODONE) 5 MG immediate release tablet   Oral   Take 5 mg by mouth 3 (three) times daily as needed for pain.         . pantoprazole (PROTONIX) 40 MG tablet   Oral   Take 40 mg by mouth daily.         . sevelamer carbonate (RENVELA) 800 MG tablet   Oral   Take 800 mg by mouth 2 (two) times daily. Breakfast and dinner         . Vitamin D, Ergocalciferol, (DRISDOL) 50000 UNITS CAPS   Oral   Take 50,000 Units by mouth 3 (three) times a week.  Approximately every other day         .  fluconazole (DIFLUCAN) 150 MG tablet   Oral   Take 150 mg by mouth once.          . Menthol, Topical Analgesic, (BENGAY EX)   Apply externally   Apply 1 application topically daily as needed (hand pain).         . nitroGLYCERIN (NITROSTAT) 0.4 MG SL tablet   Sublingual   Place 0.4 mg under the tongue as needed for chest pain. x3 doses as needed for chest pain          BP 138/66  Pulse 70  Temp(Src) 97.3 F (36.3 C) (Oral)  Resp 17  SpO2 93% Physical Exam  Nursing note and vitals reviewed. Constitutional: She appears well-developed and well-nourished.  HENT:  Head: Normocephalic and atraumatic.  Eyes: Conjunctivae are normal. Right eye exhibits no discharge. Left eye exhibits no discharge.  Neck: Normal range of motion. Neck supple.  Cardiovascular: Normal rate, regular rhythm and normal heart sounds.   Pulmonary/Chest: Effort normal. She has decreased breath sounds. She has no wheezes. She has no rhonchi. She has rales in the right lower field and the left lower field.  Abdominal: Soft. There is no tenderness. There is no rebound and no guarding.    Musculoskeletal: She exhibits edema and tenderness.  2+ pitting edema of lower extremities laterally to the knees, but symmetric.  Neurological: She is alert.  Skin: Skin is warm and dry.  Psychiatric: She has a normal mood and affect.    ED Course  Procedures (including critical care time) Labs Review Labs Reviewed  BASIC METABOLIC PANEL - Abnormal; Notable for the following:    Glucose, Bld 155 (*)    BUN 44 (*)    Creatinine, Ser 1.36 (*)    GFR calc non Af Amer 36 (*)    GFR calc Af Amer 41 (*)    All other components within normal limits  PRO B NATRIURETIC PEPTIDE - Abnormal; Notable for the following:    Pro B Natriuretic peptide (BNP) 9064.0 (*)    All other components within normal limits  GLUCOSE, CAPILLARY - Abnormal; Notable for the following:     Glucose-Capillary 145 (*)    All other components within normal limits  CBC  POCT I-STAT TROPONIN I   Imaging Review Dg Chest 2 View  05/29/2013   CLINICAL DATA:  Shortness of breath and cough for 3 weeks.  EXAM: CHEST  2 VIEW  COMPARISON:  PA and lateral chest and right side down decubitus view of the chest 05/22/2013.  FINDINGS: Small to moderate bilateral pleural effusions and basilar airspace disease, both worse on the left, are again seen. Basilar airspace disease and the effusions appear slightly worsened. There is cardiomegaly and pulmonary vascular congestion. No pneumothorax is identified.  IMPRESSION: Some worsening of small to moderate bilateral pleural effusions and basilar airspace disease, worse on the left.  Cardiomegaly and pulmonary vascular congestion.   Electronically Signed   By: Drusilla Kanner M.D.   On: 05/29/2013 21:32    Patient seen and examined. Work-up initiated. Medications ordered. Previous records reviewed.    Vital signs reviewed and are as follows: Filed Vitals:   05/29/13 2200  BP: 138/66  Pulse: 70  Temp:   Resp: 17   D/w Dr. Juleen China. Nurse will call to see if someone from ICU can again come to help attempt drainage from Aspira thoracentesis catheter.  12:26 AM After discussion with supply, we have materials for pleurex catheter but not the Aspira. Per  family, last time IR needed to be called and interventional radiology had to assist.   At this point, patient cannot be discharged as she desats to 86% off oxygen. She does not need emergent thoracentesis. Will need to arrange for IR assistance in morning.   I have spoken with Dr. Izola Price of [facility] who will admit. PCP is [facility], Dr. Felipa Eth.    MDM   1. Pleural effusion   2. Acute on chronic systolic CHF (congestive heart failure)   3. Hypoxia    Admit per above.     Renne Crigler, PA-C 05/30/13 [phone]

## 2013-05-29 NOTE — ED Notes (Signed)
Pt c/o increasing shortness of breath for 4 days. Pt states she has a "pump" in her right lung for a home health nurse to drain right lung. Saturday was last time home health was able to drain lung. Pt has taken 120 mg lasix total today, last dose of 80 mg at 1800, with no output. Pt is A&Ox4, respirations are mildly labored, skin is cool and dry. Daughter at bedside. Pt denies pain.

## 2013-05-30 ENCOUNTER — Inpatient Hospital Stay (HOSPITAL_COMMUNITY): Payer: Medicare Other

## 2013-05-30 DIAGNOSIS — N184 Chronic kidney disease, stage 4 (severe): Secondary | ICD-10-CM

## 2013-05-30 LAB — CBC
HCT: 34.1 % — ABNORMAL LOW (ref 36.0–46.0)
Hemoglobin: 10.6 g/dL — ABNORMAL LOW (ref 12.0–15.0)
MCH: 29.1 pg (ref 26.0–34.0)
MCHC: 31.1 g/dL (ref 30.0–36.0)
MCV: 93.7 fL (ref 78.0–100.0)
RBC: 3.64 MIL/uL — ABNORMAL LOW (ref 3.87–5.11)

## 2013-05-30 LAB — BASIC METABOLIC PANEL
BUN: 40 mg/dL — ABNORMAL HIGH (ref 6–23)
CO2: 31 mEq/L (ref 19–32)
Calcium: 8.6 mg/dL (ref 8.4–10.5)
Glucose, Bld: 109 mg/dL — ABNORMAL HIGH (ref 70–99)
Potassium: 4.1 mEq/L (ref 3.5–5.1)
Sodium: 140 mEq/L (ref 135–145)

## 2013-05-30 LAB — BLOOD GAS, ARTERIAL
Acid-Base Excess: 4.1 mmol/L — ABNORMAL HIGH (ref 0.0–2.0)
Bicarbonate: 30.1 mEq/L — ABNORMAL HIGH (ref 20.0–24.0)
O2 Saturation: 95.9 %
TCO2: 32.1 mmol/L (ref 0–100)
pCO2 arterial: 64.3 mmHg (ref 35.0–45.0)
pO2, Arterial: 79 mmHg — ABNORMAL LOW (ref 80.0–100.0)

## 2013-05-30 LAB — GLUCOSE, CAPILLARY
Glucose-Capillary: 197 mg/dL — ABNORMAL HIGH (ref 70–99)
Glucose-Capillary: 151 mg/dL — ABNORMAL HIGH (ref 70–99)

## 2013-05-30 LAB — TSH: TSH: 2.912 u[IU]/mL (ref 0.350–4.500)

## 2013-05-30 LAB — MRSA PCR SCREENING: MRSA by PCR: NEGATIVE

## 2013-05-30 LAB — TROPONIN I: Troponin I: <0.3 ng/mL (ref <0.30)

## 2013-05-30 MED ORDER — FUROSEMIDE 40 MG PO TABS
40.0000 mg | ORAL_TABLET | Freq: Two times a day (BID) | ORAL | Status: DC
  Administered 2013-05-30: 40 mg via ORAL
  Filled 2013-05-30 (×3): qty 1

## 2013-05-30 MED ORDER — OXYCODONE HCL 5 MG PO TABS
5.0000 mg | ORAL_TABLET | Freq: Three times a day (TID) | ORAL | Status: DC | PRN
  Administered 2013-05-30 – 2013-06-08 (×20): 5 mg via ORAL
  Filled 2013-05-30 (×21): qty 1

## 2013-05-30 MED ORDER — ENOXAPARIN SODIUM 40 MG/0.4ML ~~LOC~~ SOLN
40.0000 mg | Freq: Every day | SUBCUTANEOUS | Status: DC
  Administered 2013-05-30: 40 mg via SUBCUTANEOUS
  Filled 2013-05-30 (×2): qty 0.4

## 2013-05-30 MED ORDER — LEVALBUTEROL TARTRATE 45 MCG/ACT IN AERO
1.0000 | INHALATION_SPRAY | Freq: Three times a day (TID) | RESPIRATORY_TRACT | Status: DC | PRN
  Administered 2013-05-31 – 2013-06-07 (×2): 2 via RESPIRATORY_TRACT
  Filled 2013-05-30 (×2): qty 15

## 2013-05-30 MED ORDER — ONDANSETRON HCL 4 MG/2ML IJ SOLN: 4.0000 mg | Freq: Four times a day (QID) | INTRAMUSCULAR | Status: DC | PRN

## 2013-05-30 MED ORDER — LEVOTHYROXINE SODIUM 125 MCG PO TABS
125.0000 ug | ORAL_TABLET | Freq: Every day | ORAL | Status: DC
  Administered 2013-05-30 – 2013-06-08 (×10): 125 ug via ORAL
  Filled 2013-05-30 (×13): qty 1

## 2013-05-30 MED ORDER — SODIUM CHLORIDE 0.9 % IJ SOLN
3.0000 mL | Freq: Two times a day (BID) | INTRAMUSCULAR | Status: DC
  Administered 2013-05-30 – 2013-06-08 (×15): 3 mL via INTRAVENOUS

## 2013-05-30 MED ORDER — ALLOPURINOL 100 MG PO TABS
100.0000 mg | ORAL_TABLET | Freq: Every morning | ORAL | Status: DC
  Administered 2013-05-30 – 2013-06-08 (×10): 100 mg via ORAL
  Filled 2013-05-30 (×10): qty 1

## 2013-05-30 MED ORDER — SODIUM CHLORIDE 0.9 % IJ SOLN: 3.0000 mL | INTRAMUSCULAR | Status: DC | PRN

## 2013-05-30 MED ORDER — FUROSEMIDE 10 MG/ML IJ SOLN
40.0000 mg | Freq: Once | INTRAMUSCULAR | Status: AC
  Administered 2013-05-30: 40 mg via INTRAVENOUS
  Filled 2013-05-30: qty 4

## 2013-05-30 MED ORDER — FUROSEMIDE 10 MG/ML IJ SOLN
INTRAMUSCULAR | Status: AC
  Filled 2013-05-30: qty 8

## 2013-05-30 MED ORDER — FUROSEMIDE 10 MG/ML IJ SOLN
80.0000 mg | Freq: Two times a day (BID) | INTRAMUSCULAR | Status: DC
  Administered 2013-05-30: 80 mg via INTRAVENOUS
  Filled 2013-05-30 (×3): qty 8

## 2013-05-30 MED ORDER — FUROSEMIDE 10 MG/ML IJ SOLN: 40.0000 mg | Freq: Two times a day (BID) | INTRAMUSCULAR | Status: DC

## 2013-05-30 MED ORDER — INSULIN ASPART 100 UNIT/ML ~~LOC~~ SOLN
0.0000 [IU] | Freq: Three times a day (TID) | SUBCUTANEOUS | Status: DC
  Administered 2013-05-30: 2 [IU] via SUBCUTANEOUS
  Administered 2013-05-31 (×2): 1 [IU] via SUBCUTANEOUS
  Administered 2013-05-31: 2 [IU] via SUBCUTANEOUS
  Administered 2013-06-01: 1 [IU] via SUBCUTANEOUS
  Administered 2013-06-01: 2 [IU] via SUBCUTANEOUS
  Administered 2013-06-01 – 2013-06-02 (×2): 1 [IU] via SUBCUTANEOUS
  Administered 2013-06-02: 19:00:00 3 [IU] via SUBCUTANEOUS
  Administered 2013-06-03: 2 [IU] via SUBCUTANEOUS
  Administered 2013-06-03 – 2013-06-04 (×2): 1 [IU] via SUBCUTANEOUS
  Administered 2013-06-04: 3 [IU] via SUBCUTANEOUS
  Administered 2013-06-04: 16:00:00 1 [IU] via SUBCUTANEOUS
  Administered 2013-06-05: 13:00:00 2 [IU] via SUBCUTANEOUS
  Administered 2013-06-05: 3 [IU] via SUBCUTANEOUS
  Administered 2013-06-05 – 2013-06-08 (×9): 2 [IU] via SUBCUTANEOUS

## 2013-05-30 MED ORDER — SODIUM CHLORIDE 0.9 % IV SOLN: 250.0000 mL | INTRAVENOUS | Status: DC | PRN

## 2013-05-30 MED ORDER — ACETAMINOPHEN 500 MG PO TABS
1000.0000 mg | ORAL_TABLET | Freq: Every day | ORAL | Status: DC | PRN
  Administered 2013-05-31 – 2013-06-08 (×5): 1000 mg via ORAL
  Filled 2013-05-30 (×7): qty 2

## 2013-05-30 MED ORDER — CARVEDILOL 3.125 MG PO TABS
3.1250 mg | ORAL_TABLET | Freq: Two times a day (BID) | ORAL | Status: DC
  Administered 2013-05-30 (×2): 3.125 mg via ORAL
  Filled 2013-05-30 (×6): qty 1

## 2013-05-30 MED ORDER — ONDANSETRON HCL 4 MG PO TABS: 4.0000 mg | ORAL_TABLET | Freq: Four times a day (QID) | ORAL | Status: DC | PRN

## 2013-05-30 MED ORDER — PANTOPRAZOLE SODIUM 40 MG PO TBEC
40.0000 mg | DELAYED_RELEASE_TABLET | Freq: Every day | ORAL | Status: DC
  Administered 2013-05-30 – 2013-06-08 (×10): 40 mg via ORAL
  Filled 2013-05-30 (×9): qty 1

## 2013-05-30 MED ORDER — GUAIFENESIN ER 600 MG PO TB12
600.0000 mg | ORAL_TABLET | Freq: Two times a day (BID) | ORAL | Status: DC
  Administered 2013-05-30 – 2013-06-08 (×20): 600 mg via ORAL
  Filled 2013-05-30 (×23): qty 1

## 2013-05-30 MED ORDER — SEVELAMER CARBONATE 800 MG PO TABS
800.0000 mg | ORAL_TABLET | Freq: Two times a day (BID) | ORAL | Status: DC
  Administered 2013-05-30 – 2013-06-08 (×19): 800 mg via ORAL
  Filled 2013-05-30 (×23): qty 1

## 2013-05-30 MED ORDER — FLUTICASONE PROPIONATE 50 MCG/ACT NA SUSP
2.0000 | Freq: Two times a day (BID) | NASAL | Status: DC | PRN
  Administered 2013-06-04 – 2013-06-08 (×3): 2 via NASAL
  Filled 2013-05-30: qty 16

## 2013-05-30 MED ORDER — ATORVASTATIN CALCIUM 40 MG PO TABS
40.0000 mg | ORAL_TABLET | Freq: Every day | ORAL | Status: DC
  Administered 2013-05-30 – 2013-06-07 (×10): 40 mg via ORAL
  Filled 2013-05-30 (×12): qty 1

## 2013-05-30 MED ORDER — NITROGLYCERIN 0.4 MG SL SUBL: 0.4000 mg | SUBLINGUAL_TABLET | SUBLINGUAL | Status: DC | PRN

## 2013-05-30 MED ORDER — HYDROMORPHONE HCL PF 1 MG/ML IJ SOLN: 1.0000 mg | INTRAMUSCULAR | Status: DC | PRN

## 2013-05-30 MED ORDER — ALPRAZOLAM 0.5 MG PO TABS
1.0000 mg | ORAL_TABLET | Freq: Every day | ORAL | Status: DC
  Administered 2013-05-30 – 2013-06-07 (×10): 1 mg via ORAL
  Filled 2013-05-30 (×11): qty 2

## 2013-05-30 MED ORDER — ASPIRIN EC 81 MG PO TBEC
81.0000 mg | DELAYED_RELEASE_TABLET | Freq: Every day | ORAL | Status: DC
  Administered 2013-05-30 – 2013-06-07 (×10): 81 mg via ORAL
  Filled 2013-05-30 (×12): qty 1

## 2013-05-30 NOTE — Progress Notes (Signed)
UR Completed [place]el[place], RN,BSN [phone]  

## 2013-05-30 NOTE — Progress Notes (Signed)
IR asked to eval Rt tunneled pleural drain, placed 9/4. Minimal output when hooked up at home. Worsening SOB, pt readmitted. Drain intact, site clean, no kinks in tubing. Hooked to drainage kit, however no output. Recent CXRs reviewed with Dr. Grace Isaac. There is minimal to scant effusion on right, which is likely why no output from drain  Have left drain capped.  Call IR with further issues.  Brayton El PA-C Interventional Radiology 05/30/2013 11:11 AM

## 2013-05-30 NOTE — Progress Notes (Signed)
Notified Dr. Blake Divine of what Caryn Bee, [place] from IR stated and that family was to follow up with CHF clinic and Pulmonary MD.

## 2013-05-30 NOTE — Progress Notes (Signed)
Pt transported to 2h01 in the same condition that she has been in. I have spoken with her daughter [name] and daughter in law Maralyn Sago and they are full aware of patients transfer

## 2013-05-30 NOTE — Progress Notes (Signed)
Although patient has had crackles today, it seems as if it was beginning to get worse. Sats 93 % on 2LNC. Pt able to speak about 6 to 7 words until she has to stop and take a breath. I notified Dr. Blake Divine, orders for ABG received and she would come to see pt. In the meantime, Dr. Mayford Knife consulted on pt and gave me an order for 80 IV lasix in which I gave. BP 20 minutes after lasix 104/54, sats still in the 90s on 2l [place]. Rapid response called to assist in the meantime. Dr. Blake Divine  At bedside and ABG results given to her which had critical lab results. Pt to transfer to 2 H. Will closely monitor

## 2013-05-30 NOTE — Consult Note (Addendum)
Admit date: 05/29/2013 Referring Physician  Dr. Blake Divine Primary Physician  Dr. Felipa Eth Primary Cardiologist  Dr. Anne Fu Reason for Consultation  CHF  HPI: Patient is 79 yo female with congestive heart failure, right-sided Aspira pleural drainage cath in place, who presents to [facility] ED with main concern of 2-3 days of progressively worsening shortness of breath, bilateral lower extremity edema, poor oral intake, malaise, non productive cough. Patient explains that she has pleural catheter placed and has not been drained as her family did not know how to. Apparently she has changed the brand name and was unable to drain the catheter. Per family member who also provides history, pt has taken 120 mg of Lasix today with no significant improvement in her symptoms. Pt has been recently hospitalized approximately one week prior to this admission and at that time she has had 500 cc fluid drained from thoracentesis cath. No reported fevers, chills, no specific abdominal or urinary concerns, no specific focal neurological symptoms.  In ED, pt found to be in mild distress due to shortness of breath and unable to complete sentences, initial oxygen saturation in 80's and quickly improved with oxygen via [place]. It was noted that pt could not be weaned off the oxygen due to persistent desaturations and [facility] asked to admit to telemetry floor for further evaluation and management. The hospitalist service has now asked for Cardiology consult for guidance in management of CHF.        PMH:   Past Medical History  Diagnosis Date  . Myocardial infarction 1995  . CHF (congestive heart failure)   . Pacemaker   . Hypertension   . High cholesterol   . Pleural effusion on right 04/27/2013    [name] 04/27/2013 (04/27/2013)  . Chronic kidney disease (CKD), stage IV (severe)     [name] 04/27/2013 (04/27/2013)  . Pneumonia 1948; 1954; 02/2013; ?04/27/2013    "real bad the first 2 times"; don't remember how bad in 02/2013; might have it now"   (04/27/2013)  . Exertional shortness of breath     "for the past week" (04/27/2013)  . Hypothyroidism   . Type II diabetes mellitus   . History of blood transfusion 2010    "w/hip replacement" (04/27/2013)  . Arthritis     "back; hands" (04/27/2013)  . Chronic lower back pain   . Gout     "several times; left ankle; fingers" (04/27/2013)     PSH:   Past Surgical History  Procedure Laterality Date  . Coronary angioplasty  1995  . Coronary angioplasty with stent placement  2000    "?1" (04/27/2013)  . Abdominal wall mesh  removal  07/2007    "took the part out that was infected" (04/27/2013)  . Total hip arthroplasty Left 2009  . Total hip arthroplasty Right 2010  . Insert / replace / remove pacemaker  ~ 2011  . Hernia repair  06/2007    "umbilical hernia repair" (04/27/2013)  . Cataract extraction w/ intraocular lens implant Right 05/2012  . Joint replacement    . Thoracentesis Right 04/28/2013    Allergies:  Lisinopril; Morphine and related; Phenergan; Prednisone; and Trazodone and nefazodone Prior to Admit Meds:   Prescriptions prior to admission  Medication Sig Dispense Refill  . acetaminophen (TYLENOL) 500 MG tablet Take 1,000 mg by mouth daily as needed for pain.      Marland Kitchen allopurinol (ZYLOPRIM) 100 MG tablet Take 100 mg by mouth every morning.       Marland Kitchen ALPRAZolam (XANAX) 1 MG  tablet Take 1 mg by mouth at bedtime.      Marland Kitchen aspirin EC 81 MG tablet Take 81 mg by mouth at bedtime.      Marland Kitchen atorvastatin (LIPITOR) 40 MG tablet Take 40 mg by mouth at bedtime.       . carvedilol (COREG) 3.125 MG tablet Take 1 tablet (3.125 mg total) by mouth 2 (two) times daily with a meal.  60 tablet  0  . fluticasone (FLONASE) 50 MCG/ACT nasal spray Place 2 sprays into the nose 2 (two) times daily as needed for rhinitis or allergies.      . furosemide (LASIX) 40 MG tablet Take 40 mg by mouth 2 (two) times daily.      Marland Kitchen glyBURIDE (DIABETA) 2.5 MG tablet Take 1.25 mg by mouth daily as needed (if sugar is over  120).      Marland Kitchen guaiFENesin (MUCINEX) 600 MG 12 hr tablet Take 1 tablet (600 mg total) by mouth 2 (two) times daily.  60 tablet  0  . levalbuterol (XOPENEX HFA) 45 MCG/ACT inhaler Inhale 1-2 puffs into the lungs 3 (three) times daily as needed for wheezing or shortness of breath.      . levothyroxine (SYNTHROID, LEVOTHROID) 125 MCG tablet Take 125 mcg by mouth daily before breakfast.       . omega-3 acid ethyl esters (LOVAZA) 1 G capsule Take 1 g by mouth See admin instructions. Take 1 capsule (1 g) daily, take a 2nd capsule if needed for constipation      . oxyCODONE (OXY IR/ROXICODONE) 5 MG immediate release tablet Take 5 mg by mouth 3 (three) times daily as needed for pain.      . pantoprazole (PROTONIX) 40 MG tablet Take 40 mg by mouth daily.      . sevelamer carbonate (RENVELA) 800 MG tablet Take 800 mg by mouth 2 (two) times daily. Breakfast and dinner      . Vitamin D, Ergocalciferol, (DRISDOL) 50000 UNITS CAPS Take 50,000 Units by mouth 3 (three) times a week. Approximately every other day      . fluconazole (DIFLUCAN) 150 MG tablet Take 150 mg by mouth once.       . Menthol, Topical Analgesic, (BENGAY EX) Apply 1 application topically daily as needed (hand pain).      . nitroGLYCERIN (NITROSTAT) 0.4 MG SL tablet Place 0.4 mg under the tongue as needed for chest pain. x3 doses as needed for chest pain       Fam HX:   History reviewed. No pertinent family history. Social HX:    History   Social History  . Marital Status: Widowed    Spouse Name: N/A    Number of Children: N/A  . Years of Education: N/A   Occupational History  . Not on file.   Social History Main Topics  . Smoking status: Never Smoker   . Smokeless tobacco: Never Used  . Alcohol Use: No  . Drug Use: No  . Sexual Activity: No   Other Topics Concern  . Not on file   Social History Narrative  . No narrative on file     ROS:  All 11 ROS were addressed and are negative except what is stated in the HPI  Physical  Exam: Blood pressure 122/60, pulse 70, temperature 97.4 F (36.3 C), temperature source Oral, resp. rate 20, SpO2 97.00%.    General: Well developed, well nourished, in no acute distress Head: Eyes PERRLA, No xanthomas.   Normal cephalic and  atramatic  Lungs:   Crackles throughout both lung fields anteriorly and posteriorly Heart:   HRRR S1 S2 Pulses are 2+ & equal.            No carotid bruit. No JVD.  No abdominal bruits. No femoral bruits. Abdomen: Bowel sounds are positive, abdomen soft and non-tender without masses  Extremities:   3+ edema bilaterally Neuro: Alert and oriented X 3. Psych:  Good affect, responds appropriately    Labs:   Lab Results  Component Value Date   WBC 7.3 05/30/2013   HGB 10.6* 05/30/2013   HCT 34.1* 05/30/2013   MCV 93.7 05/30/2013   PLT 248 05/30/2013    Recent Labs Lab 05/30/13 0630  NA 140  K 4.1  CL 102  CO2 31  BUN 40*  CREATININE 1.33*  CALCIUM 8.6  GLUCOSE 109*   No results found for this basename: PTT   Lab Results  Component Value Date   INR 0.99 05/11/2013   INR 1.16 04/27/2013   INR 1.0 01/21/2009   Lab Results  Component Value Date   CKTOTAL 82 01/31/2013   CKMB 7.0* 01/31/2013   TROPONINI <0.30 05/04/2013     No results found for this basename: CHOL   No results found for this basename: HDL   No results found for this basename: LDLCALC   No results found for this basename: TRIG   No results found for this basename: CHOLHDL   No results found for this basename: LDLDIRECT      Radiology:  Dg Chest 2 View  05/29/2013   CLINICAL DATA:  Shortness of breath and cough for 3 weeks.  EXAM: CHEST  2 VIEW  COMPARISON:  PA and lateral chest and right side down decubitus view of the chest 05/22/2013.  FINDINGS: Small to moderate bilateral pleural effusions and basilar airspace disease, both worse on the left, are again seen. Basilar airspace disease and the effusions appear slightly worsened. There is cardiomegaly and pulmonary  vascular congestion. No pneumothorax is identified.  IMPRESSION: Some worsening of small to moderate bilateral pleural effusions and basilar airspace disease, worse on the left.  Cardiomegaly and pulmonary vascular congestion.   Electronically Signed   By: Drusilla Kanner M.D.   On: 05/29/2013 21:32   EKG:  V paced rhythm  Assessment and Plan:  Principal Problem:  Acute respiratory failure with hypoxia  - most likely secondary to pleural effusion, vascular congestion although BNP is actually down from what it was 9/15 - IV Lasix given last PM with minimal UOP.  She is very volume overloaded and was placed back on PO Lasix this am.  She clearly needs to be on IV Lasix so I will start Lasix 80mg  BID IV since she did not have much output last PM with 40mg .  I will transfer her to stepdown.  Consult Advanced Heart Failure Team. - daily weights, strict I's and O's .  Check TSH Active Problems:  Pleural effusion - right  - IR consulted and said that catheter was functioning and there was no pleural fluid to drain CKD (chronic kidney disease) stage 4, GFR 15-29 ml/min  Acute on chronic systolic CHF (congestive heart failure)  - IV Lasix DM (diabetes mellitus)  - A1C recently checked and ~7 (05/2013), place on SSI for now      Quintella Reichert, MD  05/30/2013  2:25 PM

## 2013-05-30 NOTE — Progress Notes (Addendum)
[facility] PROGRESS NOTE  Miranda Cruz ZOX:[id number] DOB: 10/21/1929 DOA: 05/29/2013 PCP: Hoyle Sauer, MD Brief HPI: Patient is 79 yo female with congestive heart failure, right-sided Aspira pleural drainage cath in place, who presents to [facility] ED with main concern of 2-3 days of progressively worsening shortness of breath, bilateral lower extremity edema, poor oral intake, malaise, non productive cough. Patient explains that she has pleural catheter placed and he has not been drained as her family did not know how to. Apparently she has changed the brand name and was unable to drain the catheter. Per family member who also provides history, pt has taken 120 mg of Lasix on 9/22 with no significant improvement in her symptoms. Pt has been recently hospitalized approximately one week prior to this admission and at that time she has had 500 cc fluid drained from thoracentesis cath. In ED, pt found to be in mild distress due to shortness of breath and unable to complete sentences, initial oxygen saturation in 80's and quickly improved with oxygen via [place]. She is on 2 lit [place] oxygen, with good sats . TR[facility] asked to admit to telemetry floor for further evaluation and management.    Assessment/Plan: 1. Acute on chronic respiratory failure with hypoxia and hypercapnia( evident on ABG): - Transfer pt to stepdown.  - pt was found tachypneic, with audible crackles on exam.  - ABG drawn showed respiratory acidosis with hypercapnia on 2 lit [place] oxygen.  - stat CXR ordered , pro bnp better than 9/15. - cardiology consulted, recommended 80 MG IV BID,. And heart failure team consult. Heart failure team consulted.  - pulm consulted as per family request.  - serial troponins, repeat EKG.  - bipap PRN.if she can tolerate.  - I/O last 3 completed shifts: In: -  Out: 400 [Urine:400] Total I/O In: -  Out: 1000 [Urine:1000]    2. Right Pleural Effusion: IR consulted and could not drain fluid from the pleurex.    3. CKD stage 3: continue to monitor .  4. ANEMIA: normocytic. A drop in hemoglobin from 12 to 10.6. Anemia panel ordered and stool for occult blood ordered. NO obvious source of bleeding.   5.DVT prophylaxis.   6/ Diabetes Mellitus:  CBG (last 3)   Recent Labs  05/29/13 2105  GLUCAP 145*   Resume SSI.  HGBA1C IS 7.  Glyburide held.   7. Hypothyroidism: resume home dose of synthroid. TSH on 5/29 1.439.   Code Status: full code Family Communication: spoke to daughter over the phone Disposition Plan: pending.    Consultants: Cardiology IR Procedures:  NONE  Antibiotics:  none  HPI/Subjective: Feel exhausted,   Objective: Filed Vitals:   05/30/13 0520  BP: 122/60  Pulse: 70  Temp: 97.4 F (36.3 C)  Resp: 20    Intake/Output Summary (Last 24 hours) at 05/30/13 1551 Last data filed at 05/30/13 1530  Gross per 24 hour  Intake      0 ml  Output   1400 ml  Net  -1400 ml   There were no vitals filed for this visit.  Exam:   General:  Alert lethargic afebrile comfortable  Cardiovascular: s1s2  Respiratory: Decreased air entry at bases,  Basilar crackles, no wheezing heard.  Abdomen: soft NT ND BS+  Musculoskeletal: 2+ pedal edema  Data Reviewed: Basic Metabolic Panel:  Recent Labs Lab 05/29/13 2056 05/30/13 0630  NA 139 140  K 4.5 4.1  CL 103 102  CO2 26 31  GLUCOSE 155*  109*  BUN 44* 40*  CREATININE 1.36* 1.33*  CALCIUM 8.6 8.6   Liver Function Tests: No results found for this basename: AST, ALT, ALKPHOS, BILITOT, PROT, ALBUMIN,  in the last 168 hours No results found for this basename: LIPASE, AMYLASE,  in the last 168 hours No results found for this basename: AMMONIA,  in the last 168 hours CBC:  Recent Labs Lab 05/29/13 2056 05/30/13 0630  WBC 8.2 7.3  HGB 12.0 10.6*  HCT 37.8 34.1*  MCV 93.1 93.7  PLT 259 248   Cardiac Enzymes:  Recent Labs Lab 05/30/13 1424  TROPONINI <0.30   BNP (last 3 results)  Recent  Labs  05/22/13 1512 05/29/13 2056 05/30/13 1424  PROBNP 11291.0* 9064.0* 8898.0*   CBG:  Recent Labs Lab 05/29/13 2105  GLUCAP 145*    No results found for this or any previous visit (from the past 240 hour(s)).   Studies: Dg Chest 2 View  05/29/2013   CLINICAL DATA:  Shortness of breath and cough for 3 weeks.  EXAM: CHEST  2 VIEW  COMPARISON:  PA and lateral chest and right side down decubitus view of the chest 05/22/2013.  FINDINGS: Small to moderate bilateral pleural effusions and basilar airspace disease, both worse on the left, are again seen. Basilar airspace disease and the effusions appear slightly worsened. There is cardiomegaly and pulmonary vascular congestion. No pneumothorax is identified.  IMPRESSION: Some worsening of small to moderate bilateral pleural effusions and basilar airspace disease, worse on the left.  Cardiomegaly and pulmonary vascular congestion.   Electronically Signed   By: Drusilla Kanner M.D.   On: 05/29/2013 21:32    Scheduled Meds: . allopurinol  100 mg Oral q morning - 10a  . ALPRAZolam  1 mg Oral QHS  . aspirin EC  81 mg Oral QHS  . atorvastatin  40 mg Oral QHS  . carvedilol  3.125 mg Oral BID WC  . enoxaparin (LOVENOX) injection  40 mg Subcutaneous Daily  . furosemide  80 mg Intravenous BID  . guaiFENesin  600 mg Oral BID  . levothyroxine  125 mcg Oral QAC breakfast  . pantoprazole  40 mg Oral Daily  . sevelamer carbonate  800 mg Oral BID WC  . sodium chloride  3 mL Intravenous Q12H   Continuous Infusions:   Principal Problem:   Acute respiratory failure with hypoxia Active Problems:   CKD (chronic kidney disease) stage 4, GFR 15-29 ml/min   DM (diabetes mellitus)   Pleural effusion - right   Acute on chronic systolic CHF (congestive heart failure)    Time spent: 45 min    Miranda Cruz  [facility] Pager [phone] If 7PM-7AM, please contact night-coverage at [email or web address], password [facility] 05/30/2013, 3:51 PM  LOS: 1 day

## 2013-05-30 NOTE — H&P (Addendum)
[facility] History and Physical  VERL WHITMORE ZOX:[id number] DOB: [birth year] DOA: 05/29/2013  Referring physician: ED physician PCP: Hoyle Sauer, MD   Chief Complaint: shortness of breath   HPI:  Patient is 79 yo female with congestive heart failure, right-sided Aspira pleural drainage cath in place, who presents to [facility] ED with main concern of 2-3 days of progressively worsening shortness of breath, bilateral lower extremity edema, poor oral intake, malaise, non productive cough. Patient explains that she has pleural catheter placed and he has not been drained as her family did not know how to. Apparently she has changed the brand name and was unable to drain the catheter. Per family member who also provides history, pt has taken 120 mg of Lasix today with no significant improvement in her symptoms. Pt has been recently hospitalized approximately one week prior to this admission and at that time she has had 500 cc fluid drained from thoracentesis cath. No reported fevers, chills, no specific abdominal or urinary concerns, no specific focal neurological symptoms.   In ED, pt found to be in mild distress due to shortness of breath and unable to complete sentences, initial oxygen saturation in 80's and quickly improved with oxygen via [place]. It was noted that pt could not be weaned off the oxygen due to persistent desaturations and TR[facility] asked to admit to telemetry floor for further evaluation and management.   Assessment and Plan:  Principal Problem:   Acute respiratory failure with hypoxia - most likely secondary to pleural effusion, vascular congestion - will place on telemetry bed, give one dose of Lasix 40 mg IV and continue with home dose Lasix 40 mg BID - will place consult for IR to drain pleurex cath - monitor vitals per floor protocol - daily weights, strict I's and O's Active Problems:   Pleural effusion - right - IR consult for drainage in AM   CKD (chronic kidney disease)  stage 4, GFR 15-29 ml/min - monitor renal function closely as I have given pt extra dose of Lasix 40 mg IV x 1 dose - BMP in AM   Acute on chronic systolic CHF (congestive heart failure) - place on tele unit - Lasix on board, strict I's and O's, daily weights - readjust the does of Lasix as clinically indicated and based on renal function    DM (diabetes mellitus) - A1C recently checked and ~7 (05/2013), place on SSI for now   Code Status: Full Family Communication: Pt at bedside Disposition Plan: admit to telemetry floor   Review of Systems:  Constitutional: Negative for diaphoresis.  HENT: Negative for hearing loss, ear pain, nosebleeds, congestion, sore throat, neck pain, tinnitus and ear discharge.   Eyes: Negative for blurred vision, double vision, photophobia, pain, discharge and redness.  Respiratory: Negative for hemoptysis, sputum production, wheezing and stridor.   Cardiovascular: Negative for palpitations, orthopnea, claudication and leg swelling.  Gastrointestinal: Negative for nausea, vomiting and abdominal pain. Negative for heartburn, constipation, blood in stool and melena.  Genitourinary: Negative for dysuria, urgency, frequency, hematuria and flank pain.  Musculoskeletal: Negative for myalgias, back pain, joint pain and falls.  Skin: Negative for itching and rash.  Neurological: Negative for tingling, tremors, sensory change, speech change, focal weakness, loss of consciousness and headaches.  Endo/Heme/Allergies: Negative for environmental allergies and polydipsia. Does not bruise/bleed easily.  Psychiatric/Behavioral: Negative for suicidal ideas. The patient is not nervous/anxious.      Past Medical History  Diagnosis Date  . Myocardial infarction 1995  .  CHF (congestive heart failure)   . Pacemaker   . Hypertension   . High cholesterol   . Pleural effusion on right 04/27/2013    [name] 04/27/2013 (04/27/2013)  . Chronic kidney disease (CKD), stage IV (severe)      [name] 04/27/2013 (04/27/2013)  . Pneumonia 1948; 1954; 02/2013; ?04/27/2013    "real bad the first 2 times"; don't remember how bad in 02/2013; might have it now"  (04/27/2013)  . Exertional shortness of breath     "for the past week" (04/27/2013)  . Hypothyroidism   . Type II diabetes mellitus   . History of blood transfusion 2010    "w/hip replacement" (04/27/2013)  . Arthritis     "back; hands" (04/27/2013)  . Chronic lower back pain   . Gout     "several times; left ankle; fingers" (04/27/2013)    Past Surgical History  Procedure Laterality Date  . Coronary angioplasty  1995  . Coronary angioplasty with stent placement  2000    "?1" (04/27/2013)  . Abdominal wall mesh  removal  07/2007    "took the part out that was infected" (04/27/2013)  . Total hip arthroplasty Left 2009  . Total hip arthroplasty Right 2010  . Insert / replace / remove pacemaker  ~ 2011  . Hernia repair  06/2007    "umbilical hernia repair" (04/27/2013)  . Cataract extraction w/ intraocular lens implant Right 05/2012  . Joint replacement    . Thoracentesis Right 04/28/2013    Social History:  reports that she has never smoked. She has never used smokeless tobacco. She reports that she does not drink alcohol or use illicit drugs.  Allergies  Allergen Reactions  . Lisinopril     Informed by patient and family that medication causes kidney failure in patient and during last hospitalization was told by her cardiologist not to take this med.  . Morphine And Related Other (See Comments)    Made her loopy  . Phenergan [Promethazine Hcl] Other (See Comments)    Made her crazy  . Prednisone Other (See Comments)    Confusion, bad dreams, insomnia  . Trazodone And Nefazodone Other (See Comments)    hallucinations     History reviewed. No pertinent family history.  Medication Sig  acetaminophen (TYLENOL) 500 MG tablet Take 1,000 mg by mouth daily as needed for pain.  allopurinol (ZYLOPRIM) 100 MG tablet Take 100  mg by mouth every morning.   ALPRAZolam (XANAX) 1 MG tablet Take 1 mg by mouth at bedtime.  aspirin EC 81 MG tablet Take 81 mg by mouth at bedtime.  atorvastatin (LIPITOR) 40 MG tablet Take 40 mg by mouth at bedtime.   carvedilol (COREG) 3.125 MG tablet Take 1 tablet (3.125 mg total) by mouth 2 (two) times daily with a meal.  fluticasone (FLONASE) 50 MCG/ACT nasal spray Place 2 sprays into the nose 2 (two) times daily as needed for rhinitis or allergies.  furosemide (LASIX) 40 MG tablet Take 40 mg by mouth 2 (two) times daily.  glyBURIDE (DIABETA) 2.5 MG tablet Take 1.25 mg by mouth daily as needed (if sugar is over 120).  guaiFENesin (MUCINEX) 600 MG 12 hr tablet Take 1 tablet (600 mg total) by mouth 2 (two) times daily.  levalbuterol (XOPENEX HFA) 45 MCG/ACT inhaler Inhale 1-2 puffs into the lungs 3 (three) times daily as needed for wheezing or shortness of breath.  levothyroxine (SYNTHROID, LEVOTHROID) 125 MCG tablet Take 125 mcg by mouth daily before breakfast.   omega-3  acid ethyl esters (LOVAZA) 1 G capsule Take 1 g by mouth See admin instructions. Take 1 capsule (1 g) daily, take a 2nd capsule if needed for constipation  oxyCODONE (OXY IR/ROXICODONE) 5 MG immediate release tablet Take 5 mg by mouth 3 (three) times daily as needed for pain.  pantoprazole (PROTONIX) 40 MG tablet Take 40 mg by mouth daily.  sevelamer carbonate (RENVELA) 800 MG tablet Take 800 mg by mouth 2 (two) times daily. Breakfast and dinner  Vitamin D, Ergocalciferol, (DRISDOL) 50000 UNITS CAPS Take 50,000 Units by mouth 3 (three) times a week. Approximately every other day  fluconazole (DIFLUCAN) 150 MG tablet Take 150 mg by mouth once.   Menthol, Topical Analgesic, (BENGAY EX) Apply 1 application topically daily as needed (hand pain).  nitroGLYCERIN (NITROSTAT) 0.4 MG SL tablet Place 0.4 mg under the tongue as needed for chest pain. x3 doses as needed for chest pain    Physical Exam: Filed Vitals:   05/29/13 2053  05/29/13 2100 05/29/13 2118 05/29/13 2200  BP: 154/79 132/58 160/50 138/66  Pulse: 72 70 88 70  Temp:      TempSrc:      Resp: 25 16 15 17   SpO2: 99% 99% 100% 93%    Physical Exam  Constitutional: Appears to be in mild distress due to shortness of breath   HENT: Normocephalic. External right and left ear normal. Dry MM Eyes: Conjunctivae and EOM are normal. PERRLA, no scleral icterus.  Neck: Normal ROM. Neck supple. No JVD. No tracheal deviation. No thyromegaly.  CVS: RRR, S1/S2 +, no murmurs, no gallops, no carotid bruit.  Pulmonary: Poor air movement on the right side, crackles at bases Abdominal: Soft. BS +,  no distension, tenderness, rebound or guarding.  Musculoskeletal: Normal range of motion. Bilateral LE symmetric pitting edema.  Lymphadenopathy: No lymphadenopathy noted, cervical, inguinal. Neuro: Alert. Normal reflexes, muscle tone coordination. No cranial nerve deficit. Skin: Skin is warm and dry. No rash noted. Not diaphoretic. No erythema. No pallor.  Psychiatric: Normal mood and affect. Behavior, judgment, thought content normal.   Labs on Admission:  Basic Metabolic Panel:  Recent Labs Lab 05/29/13 2056  NA 139  K 4.5  CL 103  CO2 26  GLUCOSE 155*  BUN 44*  CREATININE 1.36*  CALCIUM 8.6   CBC:  Recent Labs Lab 05/29/13 2056  WBC 8.2  HGB 12.0  HCT 37.8  MCV 93.1  PLT 259   CBG:  Recent Labs Lab 05/29/13 2105  GLUCAP 145*    Radiological Exams on Admission: Dg Chest 2 View  05/29/2013   Some worsening of small to moderate bilateral pleural effusions and basilar airspace disease, worse on the left.  Cardiomegaly and pulmonary vascular congestion.     EKG: Normal sinus rhythm, no ST/T wave changes  Debbora Presto, MD  [facility] Pager [phone]  If 7PM-7AM, please contact night-coverage [email or web address] Password [facility] 05/30/2013, 12:31 AM

## 2013-05-30 NOTE — Care Management Note (Addendum)
    Page 1 of 2   06/08/2013     10:57:03 AM   CARE MANAGEMENT NOTE 06/08/2013  Patient:  Miranda Cruz, Miranda Cruz   Account Number:  [id number]  Date Initiated:  05/30/2013  Documentation initiated by:  GRAVES-BIGELOW,BRENDA  Subjective/Objective Assessment:   Pt admitted for SOB treating with iv lasix. Pt is active with [facility]. CM did call [facility] to verify.     Action/Plan:   Pt has a right-sided Aspira pleural drainage cath in place and [facility] RN/ daughter drains. Per pt she has to always come to hospital to get the cath drained.   Anticipated DC Date:  06/08/2013   Anticipated DC Plan:  HOME W HOSPICE CARE  In-house referral  Clinical Social Worker  Hospice / Palliative Care      DC Planning Services  CM consult      [facility] [employer] Choice  Resumption Of Svcs/PTA Miranda Cruz  HOSPICE   Choice offered to / List presented to:  C-4 Adult Children   DME arranged  [facility]  WHEELCHAIR - MANUAL      DME agency  [facility]     [facility] arranged  HH-1 RN  HH-10 DISEASE MANAGEMENT      Status of service:  In process, will continue to follow Medicare Important Message given?   (If response is "NO", the following Medicare IM given date fields will be blank) Date Medicare IM given:   Date Additional Medicare IM given:    Discharge Disposition:    Per UR Regulation:    If discussed at Long Length of Stay Meetings, dates discussed:    Comments:  06/08/13 1015 Miranda Cohn, RN, BSN, NCM [phone] Spoke to daughter, Miranda Cruz [phone]) via telephone regarding discharge planning for Home Hospice services for her mom.  Daughter chose [facility] of  to render services.  Miranda David, RN notified. NCM oredered package B for pt home DME needs.  Pt to D/C home today via ambulance. Miranda Cliche, LCSW aware and will call for transportation home.  05-30-13 [address] [place], [place] [phone] When pt is d/c pt will need resumption  orders for [facility] and drain orders. Pt will also need the correct supplies for Aspira Cath.  Pt does have a f/u appointment with MD with [facility] on June 02, 2013 at 11:45.  CM will continue to monitor for disposition needs.

## 2013-05-30 NOTE — Progress Notes (Signed)
Patient has a current order for Coreg.  Patient's son, Doreene Eland, stated that her medication was recently changed from Metoprolol to Coreg and negative changes were noticed immediately.  Patient has been more fatigued than normal since this change.  Family stated that patient tolerated Metoprolol with no problems for 15+ years.  Would like to consider changing patient back to metoprolol.

## 2013-05-30 NOTE — Progress Notes (Signed)
Called to assist with patient with increasing crackles and need for transfer to higher level of care.  On arrival patient supine in bed.  Easily aroused but more sleepy than normal per RN Chrissy.  RR 26 - states she gets tired very easy now - moderate distress.  Bil BS with crackles.  Lasix IV given PTA.  Dr. Blake Divine present.  ABG drawn.  Results to Dr. Blake Divine.  Noted pCO2 level and pH.  No prior ABG noted to compare.  Bipap to be started if patient tolerates per order.  Possible claustrophobic - will try BiPap.  Transferred to 6N62 - as SDU transfer.  Staff to transfer - RRT to another call.

## 2013-05-31 LAB — URINALYSIS, ROUTINE W REFLEX MICROSCOPIC
Bilirubin Urine: NEGATIVE
Ketones, ur: NEGATIVE mg/dL
Leukocytes, UA: NEGATIVE
Nitrite: NEGATIVE
Protein, ur: NEGATIVE mg/dL
Urobilinogen, UA: 0.2 mg/dL (ref 0.0–1.0)

## 2013-05-31 LAB — BASIC METABOLIC PANEL
CO2: 30 mEq/L (ref 19–32)
Calcium: 8.3 mg/dL — ABNORMAL LOW (ref 8.4–10.5)
Creatinine, Ser: 1.53 mg/dL — ABNORMAL HIGH (ref 0.50–1.10)
GFR calc Af Amer: 36 mL/min — ABNORMAL LOW (ref >90)
Potassium: 4.6 mEq/L (ref 3.5–5.1)
Sodium: 142 mEq/L (ref 135–145)

## 2013-05-31 LAB — HEMOGLOBIN AND HEMATOCRIT, BLOOD
HCT: 35.2 % — ABNORMAL LOW (ref 36.0–46.0)
Hemoglobin: 10.7 g/dL — ABNORMAL LOW (ref 12.0–15.0)

## 2013-05-31 LAB — VITAMIN B12: Vitamin B-12: 479 pg/mL (ref 211–911)

## 2013-05-31 LAB — GLUCOSE, CAPILLARY
Glucose-Capillary: 142 mg/dL — ABNORMAL HIGH (ref 70–99)
Glucose-Capillary: 161 mg/dL — ABNORMAL HIGH (ref 70–99)
Glucose-Capillary: 143 mg/dL — ABNORMAL HIGH (ref 70–99)
Glucose-Capillary: 159 mg/dL — ABNORMAL HIGH (ref 70–99)

## 2013-05-31 LAB — TROPONIN I: Troponin I: <0.3 ng/mL (ref <0.30)

## 2013-05-31 LAB — FOLATE: Folate: 12.5 ng/mL

## 2013-05-31 MED ORDER — POLYETHYLENE GLYCOL 3350 17 G PO PACK
17.0000 g | PACK | Freq: Every day | ORAL | Status: DC
  Administered 2013-06-01 – 2013-06-08 (×8): 17 g via ORAL
  Filled 2013-05-31 (×8): qty 1

## 2013-05-31 MED ORDER — GLUCERNA SHAKE PO LIQD: 237.0000 mL | Freq: Every day | ORAL | Status: DC | PRN

## 2013-05-31 MED ORDER — FUROSEMIDE 10 MG/ML IJ SOLN
15.0000 mg/h | INTRAVENOUS | Status: DC
  Administered 2013-05-31: 10 mg/h via INTRAVENOUS
  Administered 2013-06-01 – 2013-06-03 (×4): 15 mg/h via INTRAVENOUS
  Filled 2013-05-31 (×13): qty 25

## 2013-05-31 MED ORDER — METOPROLOL SUCCINATE 12.5 MG HALF TABLET
12.5000 mg | ORAL_TABLET | Freq: Every day | ORAL | Status: DC
  Administered 2013-05-31 – 2013-06-08 (×9): 12.5 mg via ORAL
  Filled 2013-05-31 (×9): qty 1

## 2013-05-31 MED ORDER — HEPARIN SODIUM (PORCINE) 5000 UNIT/ML IJ SOLN
5000.0000 [IU] | Freq: Three times a day (TID) | INTRAMUSCULAR | Status: DC
  Administered 2013-05-31 – 2013-06-08 (×25): 5000 [IU] via SUBCUTANEOUS
  Filled 2013-05-31 (×28): qty 1

## 2013-05-31 MED ORDER — FUROSEMIDE 10 MG/ML IJ SOLN
40.0000 mg | Freq: Once | INTRAMUSCULAR | Status: AC
  Administered 2013-05-31: 40 mg via INTRAVENOUS

## 2013-05-31 NOTE — Progress Notes (Signed)
INITIAL NUTRITION ASSESSMENT  DOCUMENTATION CODES Per approved criteria  -Obesity Unspecified   INTERVENTION:  Downgrade diet to Dysphagia 3 (Mechanical Soft), Heart Healthy  Glucerna Shake daily PRN (220 kcals, 9.9 gm protein per 8 fl oz bottle) RD to follow for nutrition care plan  NUTRITION DIAGNOSIS: Predicted suboptimal intake related to variable appetite as evidenced by nutrition assessment history  Goal: Pt to meet >/= 90% of their estimated nutrition needs   Monitor:  PO & supplemental intake, weight, labs, I/O's  Reason for Assessment: Malnutrition Screening Tool Report  79 y.o. female  Admitting Dx: Acute respiratory failure with hypoxia  ASSESSMENT: Patient with PMh of chronic systolic heart failure EF 35%, ICM, CKD baseline creatinine 1.8, A fib on eliquis, DM, hypothyroid, and recurrent pleural effusion with pleurex catheter; presented to ED with worsening SOB, bilateral lower extremity edema, poor oral intake, malaise and non-productive cough.  Patient sleepy upon RD visit; known to Nutritional Management with recent admission; PO intake at 95% at dinner 9/23; patient with poor dentition (would benefit from Dys 3 diet); also was drinking Glucerna Shake during previous admission ---> RD to order on as needed basis.  Height: Ht Readings from Last 1 Encounters:  05/30/13 5\' 4"  (1.626 m)    Weight: Wt Readings from Last 1 Encounters:  05/31/13 186 lb 11.7 oz (84.7 kg)    Ideal Body Weight: 120 lb  % Ideal Body Weight: 155%  Wt Readings from Last 10 Encounters:  05/31/13 186 lb 11.7 oz (84.7 kg)  05/22/13 177 lb 14.4 oz (80.695 kg)  05/15/13 174 lb 4.8 oz (79.062 kg)  05/02/13 169 lb 12.1 oz (77 kg)  02/11/13 184 lb 15.5 oz (83.9 kg)    Usual Body Weight: 177 lb  % Usual Body Weight: 105%  BMI:  Body mass index is 32.04 kg/(m^2).  Estimated Nutritional Needs: Kcal: 1700-1900 Protein: 80-90 gm Fluid: per MD  Skin: tear to sacrum  Diet Order:  Cardiac  EDUCATION NEEDS: -No education needs identified at this time   Intake/Output Summary (Last 24 hours) at 05/31/13 1114 Last data filed at 05/31/13 1000  Gross per 24 hour  Intake    280 ml  Output   1150 ml  Net   -870 ml    Labs:   Recent Labs Lab 05/29/13 2056 05/30/13 0630 05/31/13 0340  NA 139 140 142  K 4.5 4.1 4.6  CL 103 102 103  CO2 26 31 30   BUN 44* 40* 42*  CREATININE 1.36* 1.33* 1.53*  CALCIUM 8.6 8.6 8.3*  GLUCOSE 155* 109* 110*    CBG (last 3)   Recent Labs  05/29/13 2105 05/30/13 1701 05/30/13 2303  GLUCAP 145* 197* 151*    Scheduled Meds: . allopurinol  100 mg Oral q morning - 10a  . ALPRAZolam  1 mg Oral QHS  . aspirin EC  81 mg Oral QHS  . atorvastatin  40 mg Oral QHS  . guaiFENesin  600 mg Oral BID  . heparin subcutaneous  5,000 Units Subcutaneous Q8H  . insulin aspart  0-9 Units Subcutaneous TID WC  . levothyroxine  125 mcg Oral QAC breakfast  . metoprolol succinate  12.5 mg Oral Daily  . pantoprazole  40 mg Oral Daily  . sevelamer carbonate  800 mg Oral BID WC  . sodium chloride  3 mL Intravenous Q12H    Continuous Infusions: . furosemide (LASIX) infusion 10 mg/hr (05/31/13 0911)    Past Medical History  Diagnosis Date  .  Myocardial infarction 1995  . CHF (congestive heart failure)   . Pacemaker   . Hypertension   . High cholesterol   . Pleural effusion on right 04/27/2013    [name] 04/27/2013 (04/27/2013)  . Chronic kidney disease (CKD), stage IV (severe)     [name] 04/27/2013 (04/27/2013)  . Pneumonia 1948; 1954; 02/2013; ?04/27/2013    "real bad the first 2 times"; don't remember how bad in 02/2013; might have it now"  (04/27/2013)  . Exertional shortness of breath     "for the past week" (04/27/2013)  . Hypothyroidism   . Type II diabetes mellitus   . History of blood transfusion 2010    "w/hip replacement" (04/27/2013)  . Arthritis     "back; hands" (04/27/2013)  . Chronic lower back pain   . Gout     "several  times; left ankle; fingers" (04/27/2013)    Past Surgical History  Procedure Laterality Date  . Coronary angioplasty  1995  . Coronary angioplasty with stent placement  2000    "?1" (04/27/2013)  . Abdominal wall mesh  removal  07/2007    "took the part out that was infected" (04/27/2013)  . Total hip arthroplasty Left 2009  . Total hip arthroplasty Right 2010  . Insert / replace / remove pacemaker  ~ 2011  . Hernia repair  06/2007    "umbilical hernia repair" (04/27/2013)  . Cataract extraction w/ intraocular lens implant Right 05/2012  . Joint replacement    . Thoracentesis Right 04/28/2013    Maureen Chatters, RD, LDN Pager #: [phone] After-Hours Pager #: [phone]

## 2013-05-31 NOTE — Consult Note (Signed)
Advanced Heart Failure Team Consult Note  Referring Physician: Dr Mayford Knife Primary Physician: Dr Felipa Eth Primary Cardiologist:  Dr Anne Fu  Reason for Consultation:  Volume Overload  HPI:   The heart failure team was asked to provide a consult for volume overload .  Miranda Cruz is an 79 year old with a history of chronic systolic heart failure EF 35%, ICM, CKD baseline creatinine 1.8, A fib on eliquis, DM, hypothyroid, and recurrent pleural effusion with pleurex catheter.   Admitted to [facility] 8/21 through 05/02/13 with increase dyspnea and volume overload. Diuresed with IV  lasix. Discharged on lasix 120 mg bid. Had paracentesis. Discharge weight 169 pounds.   Admitted to [facility] 8/28 through 05/15/13 with progressive SOB and volume overload. Diuresed with IV lasix. Pleurex catheter placed on R for pleural effusion which remained at discharge. Discharged diuretic regimen torsemide 100 mg bid but not on dischage list. Palliative Care consulted with family requesting full code. Creatinine at discharge was 2.75.  Discharge weight was 174 pounds.   Evaluated in [facility] ED 05/22/13 for increased dyspnea and urinary retention.  Pleurex catheter drained 425 cc and she was discharged home.   She has not started torsemide but continued on lasix. She was taking lasix 40 mg bid. Followed by [facility] for pleurex catheter and heart failure. Weight at home trending up.  Intolerant carvedilol due to fatigue switched back to toprol xl.    Admitted to [facility] 05/30/13 with progressive SOB and respiratory failure. Required BiPAP. Prior to admit she had increased lasix to 120 mg but had little improvement. She has had no output from pleurex cath. In the ED O2 sats 80 % but improved with oxygen via [place]. Started on 80 mg IV lasix bid.  Admit weight 183 pounds.   Pertinent admission labs included: Pro BNP 8898, K 4.1, creatinine 1.33, CEs negative, Hemoglobin 10.6, TSH 2.9 . CXR - + CHF. Small R effusion. Moderate L effusion (viewed  personally)  Remains dyspneic and weak but improving. She was switched to lasix gtt this am and diuresis is increasing.   I spoke to her and her daughter today and her daughter says she has not been watching her salt intake well at all at home. Eating a lot of packaged foods and not taking medicines as scheduled   Review of Systems: [y] = yes, [ ]  = no   General: Weight gain [ Y]; Weight loss [ ] ; Anorexia [ ] ; Fatigue [Y ]; Fever [ ] ; Chills [ ] ; Weakness [ ]   Cardiac: Chest pain/pressure [ ] ; Resting SOB [Y ]; Exertional SOB [ Y]; Orthopnea [Y ]; Pedal Edema [Y ]; Palpitations [ ] ; Syncope [ ] ; Presyncope [ ] ; Paroxysmal nocturnal dyspnea[ ]   Pulmonary: Cough [ ] ; Wheezing[ ] ; Hemoptysis[ ] ; Sputum [ ] ; Snoring [ ]   GI: Vomiting[ ] ; Dysphagia[ ] ; Melena[ ] ; Hematochezia [ ] ; Heartburn[ ] ; Abdominal pain [ ] ; Constipation [ ] ; Diarrhea [ ] ; BRBPR [ ]   GU: Hematuria[ ] ; Dysuria [ ] ; Nocturia[ ]    Vascular: Pain in legs with walking [ ] ; Pain in feet with lying flat [ ] ; Non-healing sores [ ] ; Stroke [ ] ; TIA [ ] ; Slurred speech [ ] ;  Neuro: Headaches[ ] ; Vertigo[ ] ; Seizures[ ] ; Paresthesias[ ] ;Blurred vision [ ] ; Diplopia [ ] ; Vision changes [ ]   Ortho/Skin: Arthritis [ Y]; Joint pain [ ] ; Muscle pain [ ] ; Joint swelling [ ] ; Back Pain [ ] ; Rash [ ]   Psych: Depression[ ] ; Anxiety[ ]   Heme: Bleeding  problems [ ] ; Clotting disorders [ ] ; Anemia [ ]   Endocrine: Diabetes [ Y]; Thyroid dysfunction[ Y]  Home Medications Prior to Admission medications   Medication Sig Start Date End Date Taking? Authorizing Provider  acetaminophen (TYLENOL) 500 MG tablet Take 1,000 mg by mouth daily as needed for pain.   Yes Historical Provider, MD  allopurinol (ZYLOPRIM) 100 MG tablet Take 100 mg by mouth every morning.    Yes Historical Provider, MD  ALPRAZolam Prudy Feeler) 1 MG tablet Take 1 mg by mouth at bedtime.   Yes Historical Provider, MD  aspirin EC 81 MG tablet Take 81 mg by mouth at bedtime.   Yes  Historical Provider, MD  atorvastatin (LIPITOR) 40 MG tablet Take 40 mg by mouth at bedtime.    Yes Historical Provider, MD  carvedilol (COREG) 3.125 MG tablet Take 1 tablet (3.125 mg total) by mouth 2 (two) times daily with a meal. 05/15/13  Yes Marinda Elk, MD  fluticasone [facility]) 50 MCG/ACT nasal spray Place 2 sprays into the nose 2 (two) times daily as needed for rhinitis or allergies.   Yes Historical Provider, MD  furosemide (LASIX) 40 MG tablet Take 40 mg by mouth 2 (two) times daily.   Yes Historical Provider, MD  glyBURIDE (DIABETA) 2.5 MG tablet Take 1.25 mg by mouth daily as needed (if sugar is over 120).   Yes Historical Provider, MD  guaiFENesin (MUCINEX) 600 MG 12 hr tablet Take 1 tablet (600 mg total) by mouth 2 (two) times daily. 02/11/13  Yes Ripudeep Jenna Luo, MD  levalbuterol [facility] HFA) 45 MCG/ACT inhaler Inhale 1-2 puffs into the lungs 3 (three) times daily as needed for wheezing or shortness of breath. 02/11/13  Yes Ripudeep Jenna Luo, MD  levothyroxine (SYNTHROID, LEVOTHROID) 125 MCG tablet Take 125 mcg by mouth daily before breakfast.    Yes Historical Provider, MD  omega-3 acid ethyl esters (LOVAZA) 1 G capsule Take 1 g by mouth See admin instructions. Take 1 capsule (1 g) daily, take a 2nd capsule if needed for constipation   Yes Historical Provider, MD  oxyCODONE (OXY IR/ROXICODONE) 5 MG immediate release tablet Take 5 mg by mouth 3 (three) times daily as needed for pain.   Yes Historical Provider, MD  pantoprazole (PROTONIX) 40 MG tablet Take 40 mg by mouth daily.   Yes Historical Provider, MD  sevelamer carbonate (RENVELA) 800 MG tablet Take 800 mg by mouth 2 (two) times daily. Breakfast and dinner   Yes Historical Provider, MD  Vitamin D, Ergocalciferol, (DRISDOL) 50000 UNITS CAPS Take 50,000 Units by mouth 3 (three) times a week. Approximately every other day   Yes Historical Provider, MD  fluconazole (DIFLUCAN) 150 MG tablet Take 150 mg by mouth once.  05/18/13    Historical Provider, MD  Menthol, Topical Analgesic, (BENGAY EX) Apply 1 application topically daily as needed (hand pain).    Historical Provider, MD  nitroGLYCERIN (NITROSTAT) 0.4 MG SL tablet Place 0.4 mg under the tongue as needed for chest pain. x3 doses as needed for chest pain    Historical Provider, MD    Past Medical History: Past Medical History  Diagnosis Date  . Myocardial infarction 1995  . CHF (congestive heart failure)   . Pacemaker   . Hypertension   . High cholesterol   . Pleural effusion on right 04/27/2013    [name] 04/27/2013 (04/27/2013)  . Chronic kidney disease (CKD), stage IV (severe)     [name] 04/27/2013 (04/27/2013)  . Pneumonia 1948; 1954;  02/2013; ?04/27/2013    "real bad the first 2 times"; don't remember how bad in 02/2013; might have it now"  (04/27/2013)  . Exertional shortness of breath     "for the past week" (04/27/2013)  . Hypothyroidism   . Type II diabetes mellitus   . History of blood transfusion 2010    "w/hip replacement" (04/27/2013)  . Arthritis     "back; hands" (04/27/2013)  . Chronic lower back pain   . Gout     "several times; left ankle; fingers" (04/27/2013)    Past Surgical History: Past Surgical History  Procedure Laterality Date  . Coronary angioplasty  1995  . Coronary angioplasty with stent placement  2000    "?1" (04/27/2013)  . Abdominal wall mesh  removal  07/2007    "took the part out that was infected" (04/27/2013)  . Total hip arthroplasty Left 2009  . Total hip arthroplasty Right 2010  . Insert / replace / remove pacemaker  ~ 2011  . Hernia repair  06/2007    "umbilical hernia repair" (04/27/2013)  . Cataract extraction w/ intraocular lens implant Right 05/2012  . Joint replacement    . Thoracentesis Right 04/28/2013    Family History: History reviewed. No pertinent family history.  Social History: History   Social History  . Marital Status: Widowed    Spouse Name: N/A    Number of Children: N/A  . Years of  Education: N/A   Social History Main Topics  . Smoking status: Never Smoker   . Smokeless tobacco: Never Used  . Alcohol Use: No  . Drug Use: No  . Sexual Activity: No   Other Topics Concern  . None   Social History Narrative  . None    Allergies:  Allergies  Allergen Reactions  . Lisinopril     Informed by patient and family that medication causes kidney failure in patient and during last hospitalization was told by her cardiologist not to take this med.  . Morphine And Related Other (See Comments)    Made her loopy  . Phenergan [Promethazine Hcl] Other (See Comments)    Made her crazy  . Prednisone Other (See Comments)    Confusion, bad dreams, insomnia  . Trazodone And Nefazodone Other (See Comments)    hallucinations     Objective:    Vital Signs:   Temp:  [97.5 F (36.4 C)-98.4 F (36.9 C)] 98.4 F (36.9 C) (09/24 0000) Pulse Rate:  [70] 70 (09/24 0500) Resp:  [16-32] 22 (09/24 0500) BP: (110-133)/(42-64) 110/42 mmHg (09/24 0500) SpO2:  [92 %-100 %] 95 % (09/24 0500) Weight:  [183 lb 10.3 oz (83.3 kg)-186 lb 11.7 oz (84.7 kg)] 186 lb 11.7 oz (84.7 kg) (09/24 0500)    Weight change: Filed Weights   05/30/13 2000 05/31/13 0500  Weight: 183 lb 10.3 oz (83.3 kg) 186 lb 11.7 oz (84.7 kg)    Intake/Output:   Intake/Output Summary (Last 24 hours) at 05/31/13 9562 Last data filed at 05/31/13 0100  Gross per 24 hour  Intake    240 ml  Output   1575 ml  Net  -1335 ml     Physical Exam: General:  Elderly chronically ill appearing. No resp difficulty HEENT: normal Neck: supple. JVP to ear  Carotids 2+ bilat; no bruits. No lymphadenopathy or thryomegaly appreciated. Cor: PMI nondisplaced. Regular rate & rhythm. No rubs, gallops or murmurs. Lungs: Decreased 1/3 up bilateral. +rhonchi  R pleurex Abdomen: soft, nontender, distended. No hepatosplenomegaly. No  bruits or masses. Good bowel sounds. Extremities: no cyanosis, clubbing, rash, R and LLE 2+  edema Neuro: alert & orientedx3, cranial nerves grossly intact. moves all 4 extremities w/o difficulty. Affect pleasant   Telemetry: SAF with v-pacing  Labs: Basic Metabolic Panel:  Recent Labs Lab 05/29/13 2056 05/30/13 0630 05/31/13 0340  NA 139 140 142  K 4.5 4.1 4.6  CL 103 102 103  CO2 26 31 30   GLUCOSE 155* 109* 110*  BUN 44* 40* 42*  CREATININE 1.36* 1.33* 1.53*  CALCIUM 8.6 8.6 8.3*    Liver Function Tests: No results found for this basename: AST, ALT, ALKPHOS, BILITOT, PROT, ALBUMIN,  in the last 168 hours No results found for this basename: LIPASE, AMYLASE,  in the last 168 hours No results found for this basename: AMMONIA,  in the last 168 hours  CBC:  Recent Labs Lab 05/29/13 2056 05/30/13 0630 05/31/13 0340  WBC 8.2 7.3  --   HGB 12.0 10.6* 10.7*  HCT 37.8 34.1* 35.2*  MCV 93.1 93.7  --   PLT 259 248  --     Cardiac Enzymes:  Recent Labs Lab 05/30/13 1424 05/30/13 2111 05/31/13 0340  TROPONINI <0.30 <0.30 <0.30    BNP: BNP (last 3 results)  Recent Labs  05/22/13 1512 05/29/13 2056 05/30/13 1424  PROBNP 11291.0* 9064.0* 8898.0*    CBG:  Recent Labs Lab 05/29/13 2105 05/30/13 1701 05/30/13 2303  GLUCAP 145* 197* 151*    Coagulation Studies: No results found for this basename: LABPROT, INR,  in the last 72 hours  Other results:  Imaging: Dg Chest 2 View  05/29/2013   CLINICAL DATA:  Shortness of breath and cough for 3 weeks.  EXAM: CHEST  2 VIEW  COMPARISON:  PA and lateral chest and right side down decubitus view of the chest 05/22/2013.  FINDINGS: Small to moderate bilateral pleural effusions and basilar airspace disease, both worse on the left, are again seen. Basilar airspace disease and the effusions appear slightly worsened. There is cardiomegaly and pulmonary vascular congestion. No pneumothorax is identified.  IMPRESSION: Some worsening of small to moderate bilateral pleural effusions and basilar airspace disease, worse  on the left.  Cardiomegaly and pulmonary vascular congestion.   Electronically Signed   By: Drusilla Kanner M.D.   On: 05/29/2013 21:32   Dg Chest Port 1 View  05/30/2013   CLINICAL DATA:  Short of breath  EXAM: PORTABLE CHEST - 1 VIEW  COMPARISON:  05/29/2013  FINDINGS: Cardiac enlargement with vascular congestion and mild edema. Increasing bilateral effusions and bibasilar atelectasis.  IMPRESSION: Congestive heart failure with progression of vascular congestion and bilateral effusions.   Electronically Signed   By: Marlan Palau M.D.   On: 05/30/2013 16:34      Medications:     Current Medications: . allopurinol  100 mg Oral q morning - 10a  . ALPRAZolam  1 mg Oral QHS  . aspirin EC  81 mg Oral QHS  . atorvastatin  40 mg Oral QHS  . enoxaparin (LOVENOX) injection  40 mg Subcutaneous Daily  . furosemide  80 mg Intravenous BID  . guaiFENesin  600 mg Oral BID  . insulin aspart  0-9 Units Subcutaneous TID WC  . levothyroxine  125 mcg Oral QAC breakfast  . metoprolol succinate  12.5 mg Oral Daily  . pantoprazole  40 mg Oral Daily  . sevelamer carbonate  800 mg Oral BID WC  . sodium chloride  3 mL Intravenous Q12H  Infusions:      Assessment:   1.A/C respiratory failure 2. A/C chronic systolic heart failure EF 35% 3. R pleural effusion with pluerex catheter 4. CKD creatinine baseline 1.5 5. Hypothyroid 6.  DM  7. Cardiorenal syndrome with CHF 8. AF, chronic    AMY CLEGG, NP-C  Plan/Discussion:    Patient seen and examined with Tonye Becket, NP. We discussed all aspects of the encounter. I agree with the assessment and plan as stated above.   I suspect Miranda Cruz now has end-stage HF/cardiorenal syndrome. Her volume status has been near impossible to manage as an outpatient and she has had several recent admits as well as recurring effusions. Her family says that this is complicated by poor adherence to dietary restriction.   We talked about the fact that she has a  very narrow euvolemic window and even with close adherence to diet restrictions she still may not be able to control her HF. I told her also that she is not candidate for HD.   We had a long talk about her situation and I told her that I thought she likely had 6 months to live or less. We talked about the options:  1) Attempt to manage medically with close f/u at home and in HF Clinic 2) Consider palliative milrinone (would require RHC) 3) Hospice (my recommendation)  For now they want to try #1 to see if she can be managed as outpatient. We also discussed Code Status and she is leaning toward No CPR and No intubation but daughters want to discuss first.   Agree with lasix gtt for now. If not diuresing well may need to consider short course of inotropes.    We will follow.   Total time spent 65 minutes with over 75% of that time discussing above issue.   Daniel Bensimhon,MD 5:45 PM   Length of Stay: 2 Advanced Heart Failure Team Pager [phone] (M-F; 7a - 4p)  Please contact [facility] for night-coverage after hours (4p -7a ) and weekends on [email or web address]

## 2013-05-31 NOTE — Progress Notes (Signed)
PATIENT DETAILS Name: Miranda Cruz Age: 79 y.o. Sex: female Date of Birth: [birth year] Admit Date: 05/29/2013 Admitting Physician Dorothea Ogle, MD ZOX:[id number] R, MD  Subjective: Shortness of breath much better, off BiPAPA  Assessment/Plan: Principal Problem:   Acute respiratory failure with hypoxia -better, now off BiPAP -secondary to Acute systolic heart failure  Active Problems:   Acute on chronic systolic CHF (congestive heart failure) -both CHF team/Cards  -on lasix gtt, beta blocker -not on ACEI given CKD  Pleural Effusion -from CHF -no significant Pl effusion-right pleurex catheter in place  Hypothyroidism -c/w Levothyroxine  DM -CBG's  -SSI  Anemia -due to CKD    CKD (chronic kidney disease) stage 3- 4, GFR 15-29 ml/min -creatinine close to her usual baseline-infact better than her usual range  Disposition: Remain inpatient  DVT Prophylaxis: Prophylactic  Heparin  Code Status: Full code  Family Communication None at bedside  Procedures:  None  CONSULTS:  cardiology   MEDICATIONS: Scheduled Meds: . allopurinol  100 mg Oral q morning - 10a  . ALPRAZolam  1 mg Oral QHS  . aspirin EC  81 mg Oral QHS  . atorvastatin  40 mg Oral QHS  . guaiFENesin  600 mg Oral BID  . heparin subcutaneous  5,000 Units Subcutaneous Q8H  . insulin aspart  0-9 Units Subcutaneous TID WC  . levothyroxine  125 mcg Oral QAC breakfast  . metoprolol succinate  12.5 mg Oral Daily  . pantoprazole  40 mg Oral Daily  . sevelamer carbonate  800 mg Oral BID WC  . sodium chloride  3 mL Intravenous Q12H   Continuous Infusions: . furosemide (LASIX) infusion 10 mg/hr (05/31/13 0911)   PRN Meds:.sodium chloride, acetaminophen, feeding supplement, fluticasone, HYDROmorphone (DILAUDID) injection, levalbuterol, nitroGLYCERIN, ondansetron (ZOFRAN) IV, ondansetron, oxyCODONE, sodium chloride  Antibiotics: Anti-infectives   None       PHYSICAL EXAM: Vital signs in  last 24 hours: Filed Vitals:   05/31/13 0900 05/31/13 1000 05/31/13 1100 05/31/13 1200  BP: 127/42 118/54 116/43 111/41  Pulse: 70 69 70 70  Temp:    97.4 F (36.3 C)  TempSrc:    Oral  Resp: 23 19 22 26   Height:    5' 0.4" (1.534 m)  Weight:    84.7 kg (186 lb 11.7 oz)  SpO2: 100% 96% 98% 97%    Weight change:  Filed Weights   05/30/13 2000 05/31/13 0500 05/31/13 1200  Weight: 83.3 kg (183 lb 10.3 oz) 84.7 kg (186 lb 11.7 oz) 84.7 kg (186 lb 11.7 oz)   Body mass index is 35.99 kg/(m^2).   Gen Exam: Awake and alert with clear speech.   Neck: Supple, No JVD.   Chest: B/L Clear-except bibasilar rales CVS: S1 S2 Regular, no murmurs.  Abdomen: soft, BS +, non tender, non distended.  Extremities: 1+ edema, lower extremities warm to touch. Neurologic: Non Focal.  Skin: No Rash.   Wounds: N/A.    Intake/Output from previous day:  Intake/Output Summary (Last 24 hours) at 05/31/13 1258 Last data filed at 05/31/13 1200  Gross per 24 hour  Intake    320 ml  Output   1150 ml  Net   -830 ml     LAB RESULTS: CBC  Recent Labs Lab 05/29/13 2056 05/30/13 0630 05/31/13 0340  WBC 8.2 7.3  --   HGB 12.0 10.6* 10.7*  HCT 37.8 34.1* 35.2*  PLT 259 248  --   MCV 93.1 93.7  --   MCH 29.6  29.1  --   MCHC 31.7 31.1  --   RDW 15.5 15.6*  --     Chemistries   Recent Labs Lab 05/29/13 2056 05/30/13 0630 05/31/13 0340  NA 139 140 142  K 4.5 4.1 4.6  CL 103 102 103  CO2 26 31 30   GLUCOSE 155* 109* 110*  BUN 44* 40* 42*  CREATININE 1.36* 1.33* 1.53*  CALCIUM 8.6 8.6 8.3*    CBG:  Recent Labs Lab 05/29/13 2105 05/30/13 1701 05/30/13 2303 05/31/13 0750 05/31/13 1145  GLUCAP 145* 197* 151* 142* 161*    GFR Estimated Creatinine Clearance: 28.6 ml/min (by C-G formula based on Cr of 1.53).  Coagulation profile No results found for this basename: INR, PROTIME,  in the last 168 hours  Cardiac Enzymes  Recent Labs Lab 05/30/13 1424 05/30/13 2111  05/31/13 0340  TROPONINI <0.30 <0.30 <0.30    No components found with this basename: POCBNP,  No results found for this basename: DDIMER,  in the last 72 hours No results found for this basename: HGBA1C,  in the last 72 hours No results found for this basename: CHOL, HDL, LDLCALC, TRIG, CHOLHDL, LDLDIRECT,  in the last 72 hours  Recent Labs  05/30/13 1531  TSH 2.912    Recent Labs  05/31/13 0340  TIBC 252  IRON 22*   No results found for this basename: LIPASE, AMYLASE,  in the last 72 hours  Urine Studies No results found for this basename: UACOL, UAPR, USPG, UPH, UTP, UGL, UKET, UBIL, UHGB, UNIT, UROB, ULEU, UEPI, UWBC, URBC, UBAC, CAST, CRYS, UCOM, BILUA,  in the last 72 hours  MICROBIOLOGY: Recent Results (from the past 240 hour(s))  MRSA PCR SCREENING     Status: None   Collection Time    05/30/13  4:14 PM      Result Value Range Status   MRSA by PCR NEGATIVE  NEGATIVE Final   Comment:            The GeneXpert MRSA Assay (FDA     approved for NASAL specimens     only), is one component of a     comprehensive MRSA colonization     surveillance program. It is not     intended to diagnose MRSA     infection nor to guide or     monitor treatment for     MRSA infections.    RADIOLOGY STUDIES/RESULTS: Dg Chest 2 View  05/29/2013   CLINICAL DATA:  Shortness of breath and cough for 3 weeks.  EXAM: CHEST  2 VIEW  COMPARISON:  PA and lateral chest and right side down decubitus view of the chest 05/22/2013.  FINDINGS: Small to moderate bilateral pleural effusions and basilar airspace disease, both worse on the left, are again seen. Basilar airspace disease and the effusions appear slightly worsened. There is cardiomegaly and pulmonary vascular congestion. No pneumothorax is identified.  IMPRESSION: Some worsening of small to moderate bilateral pleural effusions and basilar airspace disease, worse on the left.  Cardiomegaly and pulmonary vascular congestion.   Electronically  Signed   By: Drusilla Kanner M.D.   On: 05/29/2013 21:32   Dg Chest 2 View  05/22/2013   CLINICAL DATA:  Shortness of breath, cough, congestion. Pleural fluid, drain in place.  EXAM: CHEST  2 VIEW  COMPARISON:  05/10/2013  FINDINGS: Left pacer is in place. Cardiomegaly with vascular congestion. Suspect mild interstitial edema. Bibasilar atelectasis. Small left pleural effusion, increased since prior study. Right pleural  drain in place without significant right effusion or pneumothorax.  IMPRESSION: Small left pleural effusion. Right pleural drain in place without visible significant effusion or pneumothorax.  Cardiomegaly, bibasilar atelectasis. Suspect mild pulmonary edema.   Electronically Signed   By: Charlett Nose M.D.   On: 05/22/2013 13:42   Dg Chest 2 View  05/10/2013   CLINICAL DATA:  79 year old female with pleural effusion.  EXAM: CHEST  2 VIEW  COMPARISON:  05/08/2013 and earlier.  FINDINGS: Seated AP and lateral views of the chest. Moderate right and small left pleural effusions have not significantly changed. Ventilation at the right lung base appears mildly decreased. Stable cardiac size and mediastinal contours. No pneumothorax. Pulmonary vascularity appears decreased. No overt edema. Stable visualized osseous structures. Left chest cardiac pacemaker.  IMPRESSION: All moderate right and small left pleural effusions not significantly changed. Mildly decreased ventilation at the right lung base. Interval decreased pulmonary vascular congestion.   Electronically Signed   By: Augusto Gamble M.D.   On: 05/10/2013 19:00   Dg Chest 2 View  05/08/2013   *RADIOLOGY REPORT*  Clinical Data: Recurrent pleural effusion.  Cough.  Short of breath.  CHEST - 2 VIEW  Comparison: 05/08/2013, 0851 hours.  Findings: Dual lead left subclavian pacemaker.  Cardiomegaly.  Low lung volumes.  Small to moderate bilateral pleural effusions and basilar atelectasis. There is pulmonary vascular congestion, interstitial and mild  alveolar pulmonary edema.  No pneumothorax.  Compared to the exam earlier today, aeration is slightly improved, likely representing interval diuresis.  IMPRESSION: Moderate CHF.   Original Report Authenticated By: Andreas Newport, M.D.   Dg Chest 2 View  05/08/2013   *RADIOLOGY REPORT*  Clinical Data: Evaluate right-sided pleural effusion after thoracentesis.  CHEST - 2 VIEW  Comparison: Chest x-ray 05/06/2013.  Findings: There is an unusual lucency both medially and laterally in the inferior right hemithorax on the frontal projection, which is suspicious for potential small pneumothorax, however, there is a right pleural effusion which is small, and this effusion does not appear to layer horizontally as would typically be seen in the setting of hydropneumothorax.  Small left pleural effusion is also noted.  Lung volumes are low, with bibasilar opacities which may reflect areas of atelectasis and/or consolidation.  Cephalization of the pulmonary vasculature, without frank pulmonary edema.  Heart size appears mildly enlarged.  Mediastinal contours are unremarkable.  Atherosclerosis of the thoracic aorta.  Left-sided pacemaker device with lead tips projecting over the expected location of the right atrium and right ventricular apex.  IMPRESSION: 1.  Unusual appearance of the right hemithorax, favored to reflect a some skin fold artifact.  Alternatively, a small right hydropneumothorax post thoracentesis is difficult to entirely exclude.  Attention on a follow-up study is suggested, preferably a standing PA and lateral chest radiograph to exclude pneumothorax. 2.  Small left pleural effusion. 3.  Cardiomegaly with pulmonary venous congestion, but no frank pulmonary edema. 4.  Bibasilar atelectasis and/or consolidation. 5.  Atherosclerosis.  These results were called by telephone on 05/08/2013 at 09:10 a.m. to nurse Lupita Leash (for Dr. Gala Romney), who verbally acknowledged these results.   Original Report Authenticated By:  Trudie Reed, M.D.   Dg Chest 2 View  05/04/2013   *RADIOLOGY REPORT*  Clinical Data: Follow up pleural effusion.  CHEST - 2 VIEW  Comparison: 04/28/2013  Findings: Moderate to large right pleural effusion is now present, reaccumulated from the prior exam.  There is perihilar opacity on the right that is likely atelectasis.  There  is a minimal left pleural effusion.  There is no overt pulmonary edema.  The cardiac silhouette is mildly enlarged.  There is no pneumothorax.  The left anterior chest wall sequential pacemaker is stable well- positioned.  IMPRESSION: Moderate to large right pleural effusion, which has reaccumulated since the prior chest radiograph.  There is associated right perihilar lung base atelectasis.  No overt pulmonary edema. Minimal left pleural effusion.   Original Report Authenticated By: Amie Portland, M.D.   Ct Chest Wo Contrast  05/06/2013   *RADIOLOGY REPORT*  Clinical Data: status post thoracentesis, evaluate for cause of recurrent pleural effusion  CT CHEST WITHOUT CONTRAST  Technique:  Multidetector CT imaging of the chest was performed following the standard protocol without IV contrast.  Comparison: multiple recent chest radiographs  Findings: The study is limited without contrast.  There are small calcified left hilar lymph nodes.  There is coronary arterial and aortic arch calcification.  Cardiac pacer leads are identified with generator over the left thorax.  The heart is enlarged.  There is a small to moderate right pleural effusion.  There is a small left pleural effusion.  There is no pneumothorax.  There is some dense material in consolidated posterior right lower lobe.  This could represent aspirated material.  Bilaterally there is dependent atelectasis.  On the right, there is a focus of irregular hazy airspace disease in the upper lobe on image number 20, measuring 21 x 13 mm.  There is more diffuse ground-glass attenuation throughout the inferior right middle lobe  and more severely in the right lower lobe.  On the left, and series 3 image number 26, there is a 1.4 cm nodular density posteriorly in the left lower lobe, but viewing this on axial, coronal, and sagittal images suggests that it is related to subsegmental atelectasis rather than a lung mass.  There is a calcified granuloma measuring 5 mm in the lingula.  There is a very mild ground-glass attenuation in the left upper lobe.  Scans through the uppermost abdomen and unremarkable.  IMPRESSION: Study is limited without contrast, but there is no specific abnormality to account for recurrent pleural effusion.  Effusions are bilateral, but larger on the right.  Dense material in consolidated right lower lobe suggests possibility of aspiration.  There is bilateral ground glass attenuation, but it is minimal on the left and much more prominent on the right.  This is nonspecific fidning, but given the asymmetry, it likely represents re-expansion pulmonary edema following thoracentesis.  Finally, there is an approximately 2cm focus of irregular hazy opacity in the right upper lobe, entirely nonspecific. Abnormalities such as atelectasis, focal edema/hemorrhage, or even adenocarcinoma can appear like this, and this should receive followup imaging to reassess.   Original Report Authenticated By: Esperanza Heir, M.D.   Dg Chest Right Decubitus  05/22/2013   CLINICAL DATA:  Pleural fluid, drain.  EXAM: CHEST - RIGHT DECUBITUS  COMPARISON:  Chest x-ray performed today and 05/10/2013  FINDINGS: A right side down decubitus view demonstrates moderate layering right pleural effusion. Right pleural drain in place.  IMPRESSION: Moderate layering right pleural effusion.   Electronically Signed   By: Charlett Nose M.D.   On: 05/22/2013 13:42   Dg Chest Port 1 View  05/30/2013   CLINICAL DATA:  Short of breath  EXAM: PORTABLE CHEST - 1 VIEW  COMPARISON:  05/29/2013  FINDINGS: Cardiac enlargement with vascular congestion and mild edema.  Increasing bilateral effusions and bibasilar atelectasis.  IMPRESSION: Congestive heart failure  with progression of vascular congestion and bilateral effusions.   Electronically Signed   By: Marlan Palau M.D.   On: 05/30/2013 16:34   Dg Chest Port 1 View  05/06/2013   *RADIOLOGY REPORT*  Clinical Data: Status post right thoracentesis.  PORTABLE CHEST - 1 VIEW  Comparison: 05/04/2013  Findings: There is been significant reduction in the right-sided pleural effusion.  No pneumothorax is identified.  A lucency is noted over the right apex which is related to a skin fold.    A pacing device is again seen.  No other focal abnormality is noted.  IMPRESSION: No pneumothorax following thoracentesis.  Significant reduction in right effusion is noted.   Original Report Authenticated By: Alcide Clever, M.D.   Ir Perc Pleural Drain W/indwell Cath W/img Guide  05/11/2013   *RADIOLOGY REPORT*  Clinical Data:  Recurrent symptomatic right-sided pleural effusion in patient with history of recurrent CHF, now seeking palliative therapy  INSERTION OF TUNNELED RIGHT SIDED PLEURAL DRAINAGE CATHETER  Comparison:  Ultrasound guided thoracentesis - 04/28/2013; chest radiograph - 05/10/2013; 05/08/2013; chest CT - 05/06/2013  Intravenous medications: Versed 2 mg IV; Fentanyl 75 mcg IV; Ancef 2 gram IV; Antibiotic was administered in an appropriate time interval for the procedure.  Total Moderate Sedation Time: 15 minutes.  Fluoroscopy time:  36 seconds  Complications:  None immediate  Procedure:  The procedure, risks, benefits, and alternatives were explained to the patient and the patient's daughter, who wish to proceed with the placement of this permanent pleural catheter as the patient is seeking palliative care.  The patient and the patient's wife understand and consent to the procedure.  The right lateral chest and right upper abdomen were prepped with Chlorhexidine in a sterile fashion, and a sterile drape was applied covering the  operative field.  A sterile gown and sterile gloves were used for the procedure.  Initial ultrasound scanning and fluoroscopic imaging demonstrates a recurrent moderate to large right-sided pleural effusion.  Under direct ultrasound guidance, the right inferior lateral pleural space was accessed with an 19 gauge trocar needle after the overlying soft tissues were anesthetized with 1% lidocaine with epinephrine. An Amplatz superstiff wire was then advanced under fluoroscopy into the pleural space.  A 16 French tunneled pleural catheter was tunneled from an incision within the right upper abdominal quadrant to the access site. The pleural access site was serially dilated under fluoroscopy, ultimately allowing placement of a peel-away sheath.  The catheter was advanced through the peel-away sheath.  The sheath was then removed.  Final catheter positioning was confirmed with a fluoroscopic radiographic image.  The access incision was closed with subcutaneous subcuticular 4-0 Vicryl, Dermabond and Steri-Strips.  A Prolene retention suture was applied at the catheter exit site.  Large volume thoracentesis was performed through the new catheter utilizing provided bulb vacuum assisted drainage bag.  The patient tolerated the above procedure well without immediate postprocedural complication.  Findings:  The catheter was placed via the right lateral chest wall.  Catheter course is towards the apex.  Approximately 1.3 liters of serous pleural fluid was able to be removed after catheter placement.  IMPRESSION:  Successful placement of permanent, tunneled right pleural drainage catheter via lateral approach.  Approximately 1.3 liters of serous pleural fluid was removed after catheter placement.   Original Report Authenticated By: Tacey Ruiz, MD    Jeoffrey Massed, MD  [facility] Pager:[phone]  If 7PM-7AM, please contact night-coverage [email or web address] Password [facility] 05/31/2013, 12:58 PM   LOS:  2  days

## 2013-05-31 NOTE — Progress Notes (Signed)
Order placed for foley by heart failure team due to aggressive diuresis with Lasix gtt. Foley huddle performed with [employer] and [place]. Unanimous vote to insert foley.

## 2013-05-31 NOTE — Progress Notes (Signed)
Subjective:  Reviewed notes. Repeat admit. Acute systolic HF Currently feels improved. Less dyspnea No CP.  IR no fluid from Plurex cath.   Objective:  Vital Signs in the last 24 hours: Temp:  [97.5 F (36.4 C)-98.4 F (36.9 C)] 97.5 F (36.4 C) (09/24 0700) Pulse Rate:  [70] 70 (09/24 0700) Resp:  [16-32] 22 (09/24 0700) BP: (104-133)/(35-64) 104/35 mmHg (09/24 0700) SpO2:  [92 %-100 %] 97 % (09/24 0700) Weight:  [83.3 kg (183 lb 10.3 oz)-84.7 kg (186 lb 11.7 oz)] 84.7 kg (186 lb 11.7 oz) (09/24 0500)  Intake/Output from previous day: 09/23 0701 - 09/24 0700 In: 240 [P.O.:240] Out: 1575 [Urine:1575]   Physical Exam: General: Well developed, well nourished, in no acute distress. Elderly Head:  Normocephalic and atraumatic. Lungs: Decreased BS bases. Faint crackles. JVD. Heart: Normal S1 and S2.  No murmur, rubs or gallops.  Abdomen: soft, non-tender, positive bowel sounds. Extremities: No clubbing or cyanosis. 1+ BLE edema. Neurologic: Alert and oriented x 3.    Lab Results:  Recent Labs  05/29/13 2056 05/30/13 0630 05/31/13 0340  WBC 8.2 7.3  --   HGB 12.0 10.6* 10.7*  PLT 259 248  --     Recent Labs  05/30/13 0630 05/31/13 0340  NA 140 142  K 4.1 4.6  CL 102 103  CO2 31 30  GLUCOSE 109* 110*  BUN 40* 42*  CREATININE 1.33* 1.53*    Recent Labs  05/30/13 2111 05/31/13 0340  TROPONINI <0.30 <0.30    Imaging: Dg Chest 2 View  05/29/2013   CLINICAL DATA:  Shortness of breath and cough for 3 weeks.  EXAM: CHEST  2 VIEW  COMPARISON:  PA and lateral chest and right side down decubitus view of the chest 05/22/2013.  FINDINGS: Small to moderate bilateral pleural effusions and basilar airspace disease, both worse on the left, are again seen. Basilar airspace disease and the effusions appear slightly worsened. There is cardiomegaly and pulmonary vascular congestion. No pneumothorax is identified.  IMPRESSION: Some worsening of small to moderate bilateral  pleural effusions and basilar airspace disease, worse on the left.  Cardiomegaly and pulmonary vascular congestion.   Electronically Signed   By: Drusilla Kanner M.D.   On: 05/29/2013 21:32   Dg Chest Port 1 View  05/30/2013   CLINICAL DATA:  Short of breath  EXAM: PORTABLE CHEST - 1 VIEW  COMPARISON:  05/29/2013  FINDINGS: Cardiac enlargement with vascular congestion and mild edema. Increasing bilateral effusions and bibasilar atelectasis.  IMPRESSION: Congestive heart failure with progression of vascular congestion and bilateral effusions.   Electronically Signed   By: Marlan Palau M.D.   On: 05/30/2013 16:34   Personally viewed.   Telemetry: No adverse rhythms Personally viewed.   EKG:  No change from prior, AFIB V paced  Cardiac Studies:  EF 30%  Assessment/Plan:  Principal Problem:   Acute respiratory failure with hypoxia Active Problems:   CKD (chronic kidney disease) stage 4, GFR 15-29 ml/min   DM (diabetes mellitus)   Pleural effusion - right   Acute on chronic systolic CHF (congestive heart failure)   - Discussed with CCM as well as advanced HF team  - Continue with aggressive diuresis. On last admit was on 80 TID IV lasix then transitioned to Demadex 100mg  BID  - Avoid ACE-I due to CKD. Prior baseline creat 1.5-1.8. Prior ARF episodes.   - Challenging situation with multisystem disease.  - Palliative care has seen on prior admit  -  I changed coreg to metoprolol  Appreciate team care.    SKAINS, MARK 05/31/2013, 9:00 AM

## 2013-05-31 NOTE — Consult Note (Signed)
PULMONARY  / CRITICAL CARE MEDICINE  Name: Miranda Cruz MRN: [id number] DOB: 06/03/1930    ADMISSION DATE:  05/29/2013 CONSULTATION DATE:  05/31/13  REFERRING MD :  Armanda Magic, MD, [facility] PRIMARY SERVICE: cards  CHIEF COMPLAINT:  Distress  BRIEF PATIENT DESCRIPTION: 82 yr CHF, pleurex rt and distress  SIGNIFICANT EVENTS / STUDIES:  9/23- SOB, wt gain, acidotic  LINES / TUBES: 8./28 admission pleurex rt>>> 9/24- called distress, pleurex catheter limited drainage  CULTURES:  ANTIBIOTIC:  HISTORY OF PRESENT ILLNESS:  Patient is 79 yo female with congestive heart failure, right-sided pleurex catheter in place, who presents to [facility] ED with main concern of 2-3 days of progressively worsening shortness of breath, bilateral lower extremity edema, poor oral intake, malaise, non productive cough. Lasix taken at home , no major improvement. Pt has been recently hospitalized approximately one week prior to this admission and at that time she has had 500 cc fluid drained from chest . In ED, pt found to be in mild distress due to shortness of breath and unable to complete sentences. Moved to 2900 for distress concerns need intubation, NIMV. Pleurex not draining well. No fevers.  PAST MEDICAL HISTORY :  Past Medical History  Diagnosis Date  . Myocardial infarction 1995  . CHF (congestive heart failure)   . Pacemaker   . Hypertension   . High cholesterol   . Pleural effusion on right 04/27/2013    [name] 04/27/2013 (04/27/2013)  . Chronic kidney disease (CKD), stage IV (severe)     [name] 04/27/2013 (04/27/2013)  . Pneumonia 1948; 1954; 02/2013; ?04/27/2013    "real bad the first 2 times"; don't remember how bad in 02/2013; might have it now"  (04/27/2013)  . Exertional shortness of breath     "for the past week" (04/27/2013)  . Hypothyroidism   . Type II diabetes mellitus   . History of blood transfusion 2010    "w/hip replacement" (04/27/2013)  . Arthritis     "back; hands" (04/27/2013)  . Chronic  lower back pain   . Gout     "several times; left ankle; fingers" (04/27/2013)   Past Surgical History  Procedure Laterality Date  . Coronary angioplasty  1995  . Coronary angioplasty with stent placement  2000    "?1" (04/27/2013)  . Abdominal wall mesh  removal  07/2007    "took the part out that was infected" (04/27/2013)  . Total hip arthroplasty Left 2009  . Total hip arthroplasty Right 2010  . Insert / replace / remove pacemaker  ~ 2011  . Hernia repair  06/2007    "umbilical hernia repair" (04/27/2013)  . Cataract extraction w/ intraocular lens implant Right 05/2012  . Joint replacement    . Thoracentesis Right 04/28/2013   Prior to Admission medications   Medication Sig Start Date End Date Taking? Authorizing Provider  acetaminophen (TYLENOL) 500 MG tablet Take 1,000 mg by mouth daily as needed for pain.   Yes Historical Provider, MD  allopurinol (ZYLOPRIM) 100 MG tablet Take 100 mg by mouth every morning.    Yes Historical Provider, MD  ALPRAZolam Prudy Feeler) 1 MG tablet Take 1 mg by mouth at bedtime.   Yes Historical Provider, MD  aspirin EC 81 MG tablet Take 81 mg by mouth at bedtime.   Yes Historical Provider, MD  atorvastatin (LIPITOR) 40 MG tablet Take 40 mg by mouth at bedtime.    Yes Historical Provider, MD  carvedilol (COREG) 3.125 MG tablet Take 1 tablet (3.125 mg  total) by mouth 2 (two) times daily with a meal. 05/15/13  Yes Marinda Elk, MD  fluticasone [facility]) 50 MCG/ACT nasal spray Place 2 sprays into the nose 2 (two) times daily as needed for rhinitis or allergies.   Yes Historical Provider, MD  furosemide (LASIX) 40 MG tablet Take 40 mg by mouth 2 (two) times daily.   Yes Historical Provider, MD  glyBURIDE (DIABETA) 2.5 MG tablet Take 1.25 mg by mouth daily as needed (if sugar is over 120).   Yes Historical Provider, MD  guaiFENesin (MUCINEX) 600 MG 12 hr tablet Take 1 tablet (600 mg total) by mouth 2 (two) times daily. 02/11/13  Yes Ripudeep Jenna Luo, MD  levalbuterol  [facility] HFA) 45 MCG/ACT inhaler Inhale 1-2 puffs into the lungs 3 (three) times daily as needed for wheezing or shortness of breath. 02/11/13  Yes Ripudeep Jenna Luo, MD  levothyroxine (SYNTHROID, LEVOTHROID) 125 MCG tablet Take 125 mcg by mouth daily before breakfast.    Yes Historical Provider, MD  omega-3 acid ethyl esters (LOVAZA) 1 G capsule Take 1 g by mouth See admin instructions. Take 1 capsule (1 g) daily, take a 2nd capsule if needed for constipation   Yes Historical Provider, MD  oxyCODONE (OXY IR/ROXICODONE) 5 MG immediate release tablet Take 5 mg by mouth 3 (three) times daily as needed for pain.   Yes Historical Provider, MD  pantoprazole (PROTONIX) 40 MG tablet Take 40 mg by mouth daily.   Yes Historical Provider, MD  sevelamer carbonate (RENVELA) 800 MG tablet Take 800 mg by mouth 2 (two) times daily. Breakfast and dinner   Yes Historical Provider, MD  Vitamin D, Ergocalciferol, (DRISDOL) 50000 UNITS CAPS Take 50,000 Units by mouth 3 (three) times a week. Approximately every other day   Yes Historical Provider, MD  fluconazole (DIFLUCAN) 150 MG tablet Take 150 mg by mouth once.  05/18/13   Historical Provider, MD  Menthol, Topical Analgesic, (BENGAY EX) Apply 1 application topically daily as needed (hand pain).    Historical Provider, MD  nitroGLYCERIN (NITROSTAT) 0.4 MG SL tablet Place 0.4 mg under the tongue as needed for chest pain. x3 doses as needed for chest pain    Historical Provider, MD   Allergies  Allergen Reactions  . Lisinopril     Informed by patient and family that medication causes kidney failure in patient and during last hospitalization was told by her cardiologist not to take this med.  . Morphine And Related Other (See Comments)    Made her loopy  . Phenergan [Promethazine Hcl] Other (See Comments)    Made her crazy  . Prednisone Other (See Comments)    Confusion, bad dreams, insomnia  . Trazodone And Nefazodone Other (See Comments)    hallucinations     FAMILY  HISTORY:  History reviewed. No pertinent family history. SOCIAL HISTORY:  reports that she has never smoked. She has never used smokeless tobacco. She reports that she does not drink alcohol or use illicit drugs.  REVIEW OF SYSTEMS:  No abdo pain, no n/v, no burning urination  SUBJECTIVE:  Sob improved  VITAL SIGNS: Temp:  [97.5 F (36.4 C)-98.4 F (36.9 C)] 97.5 F (36.4 C) (09/24 0700) Pulse Rate:  [70] 70 (09/24 0700) Resp:  [16-32] 22 (09/24 0700) BP: (104-133)/(35-64) 104/35 mmHg (09/24 0700) SpO2:  [92 %-100 %] 97 % (09/24 0700) Weight:  [83.3 kg (183 lb 10.3 oz)-84.7 kg (186 lb 11.7 oz)] 84.7 kg (186 lb 11.7 oz) (09/24 0500) HEMODYNAMICS:  VENTILATOR SETTINGS:   INTAKE / OUTPUT: Intake/Output     09/23 0701 - 09/24 0700 09/24 0701 - 09/25 0700   P.O. 240    Total Intake(mL/kg) 240 (2.8)    Urine (mL/kg/hr) 1575 (0.8)    Total Output 1575     Net -1335          Urine Occurrence 1 x      PHYSICAL EXAMINATION: General:  Awake, mild distress Neuro:  Nonfocal, good strength uppers, perrl HEENT:  jvd up mod Cardiovascular:  s1 s2 RRR distant Lungs:  Coarse bases Abdomen:  Soft, mild distentiojn, no r/g Skin: no rash ext- edema 2 plus pitting  LABS:  CBC Recent Labs     05/29/13  2056  05/30/13  0630  05/31/13  0340  WBC  8.2  7.3   --   HGB  12.0  10.6*  10.7*  HCT  37.8  34.1*  35.2*  PLT  259  248   --    Coag's No results found for this basename: APTT, INR,  in the last 72 hours BMET Recent Labs     05/29/13  2056  05/30/13  0630  05/31/13  0340  NA  139  140  142  K  4.5  4.1  4.6  CL  103  102  103  CO2  26  31  30   BUN  44*  40*  42*  CREATININE  1.36*  1.33*  1.53*  GLUCOSE  155*  109*  110*   Electrolytes Recent Labs     05/29/13  2056  05/30/13  0630  05/31/13  0340  CALCIUM  8.6  8.6  8.3*   Sepsis Markers No results found for this basename: LACTICACIDVEN, PROCALCITON, O2SATVEN,  in the last 72 hours ABG Recent Labs      05/30/13  1450  PHART  7.293*  PCO2ART  64.3*  PO2ART  79.0*   Liver Enzymes No results found for this basename: AST, ALT, ALKPHOS, BILITOT, ALBUMIN,  in the last 72 hours Cardiac Enzymes Recent Labs     05/29/13  2056  05/30/13  1424  05/30/13  2111  05/31/13  0340  TROPONINI   --   <0.30  <0.30  <0.30  PROBNP  9064.0*  8898.0*   --    --    Glucose Recent Labs     05/29/13  2105  05/30/13  1701  05/30/13  2303  GLUCAP  145*  197*  151*    Imaging Dg Chest 2 View  05/29/2013   CLINICAL DATA:  Shortness of breath and cough for 3 weeks.  EXAM: CHEST  2 VIEW  COMPARISON:  PA and lateral chest and right side down decubitus view of the chest 05/22/2013.  FINDINGS: Small to moderate bilateral pleural effusions and basilar airspace disease, both worse on the left, are again seen. Basilar airspace disease and the effusions appear slightly worsened. There is cardiomegaly and pulmonary vascular congestion. No pneumothorax is identified.  IMPRESSION: Some worsening of small to moderate bilateral pleural effusions and basilar airspace disease, worse on the left.  Cardiomegaly and pulmonary vascular congestion.   Electronically Signed   By: Drusilla Kanner M.D.   On: 05/29/2013 21:32   Dg Chest Port 1 View  05/30/2013   CLINICAL DATA:  Short of breath  EXAM: PORTABLE CHEST - 1 VIEW  COMPARISON:  05/29/2013  FINDINGS: Cardiac enlargement with vascular congestion and mild edema. Increasing bilateral effusions and bibasilar  atelectasis.  IMPRESSION: Congestive heart failure with progression of vascular congestion and bilateral effusions.   Electronically Signed   By: Marlan Palau M.D.   On: 05/30/2013 16:34     CXR: int edema, effusions bibasilar, small lung volumes  ASSESSMENT / PLAN:  PULMONARY A: CHF exacerbation, bilateral effusions, r/o malfunctioning pleurex, resp failure, new small effusion left P:   Was neg 1.4 liters thus far and clinically improved abg reviewed but now  improved, no repeat needed pcxr in am  Not suprised pleurex without a lot of output given prior CT chest low volume, even if thora for 500 cc If pleurex still remains without drainage will CT chest Lasix per chf service No NIMV needed Upright position O2 to sats 92%  CARDIOVASCULAR A: chf P:  Per chf and cards lasix  RENAL A:  Mild rise crt P:   Change off lovenox to sub q heparin Lasix Chem in am   TODAY'S SUMMARY: CHF exacerbation, may need ct chest for pleurex placement, continue laisix , no NIMV needed at this stage  I have personally obtained a history, examined the patient, evaluated laboratory and imaging results, formulated the assessment and plan and placed orders.  Mcarthur Rossetti. Tyson Alias, MD, FACP Pgr: [phone] [facility]  Pulmonary and Critical Care Medicine [facility] Pager: [phone]  05/31/2013, 7:53 AM

## 2013-06-01 ENCOUNTER — Inpatient Hospital Stay (HOSPITAL_COMMUNITY): Payer: Medicare Other

## 2013-06-01 DIAGNOSIS — I1310 Hypertensive heart and chronic kidney disease without heart failure, with stage 1 through stage 4 chronic kidney disease, or unspecified chronic kidney disease: Secondary | ICD-10-CM

## 2013-06-01 DIAGNOSIS — N19 Unspecified kidney failure: Secondary | ICD-10-CM

## 2013-06-01 LAB — CBC WITH DIFFERENTIAL/PLATELET
Basophils Absolute: 0 10*3/uL (ref 0.0–0.1)
Basophils Relative: 0 % (ref 0–1)
Hemoglobin: 10.4 g/dL — ABNORMAL LOW (ref 12.0–15.0)
MCHC: 30.8 g/dL (ref 30.0–36.0)
MCV: 94.4 fL (ref 78.0–100.0)
Monocytes Absolute: 0.8 10*3/uL (ref 0.1–1.0)
Monocytes Relative: 7 % (ref 3–12)
Neutro Abs: 8.4 10*3/uL — ABNORMAL HIGH (ref 1.7–7.7)
Neutrophils Relative %: 81 % — ABNORMAL HIGH (ref 43–77)
Platelets: 211 10*3/uL (ref 150–400)
RDW: 15.3 % (ref 11.5–15.5)

## 2013-06-01 LAB — BASIC METABOLIC PANEL
BUN: 40 mg/dL — ABNORMAL HIGH (ref 6–23)
Chloride: 98 mEq/L (ref 96–112)
GFR calc Af Amer: 41 mL/min — ABNORMAL LOW (ref >90)
GFR calc non Af Amer: 36 mL/min — ABNORMAL LOW (ref >90)
Potassium: 4 mEq/L (ref 3.5–5.1)
Sodium: 138 mEq/L (ref 135–145)

## 2013-06-01 LAB — GLUCOSE, CAPILLARY
Glucose-Capillary: 131 mg/dL — ABNORMAL HIGH (ref 70–99)
Glucose-Capillary: 197 mg/dL — ABNORMAL HIGH (ref 70–99)
Glucose-Capillary: 147 mg/dL — ABNORMAL HIGH (ref 70–99)
Glucose-Capillary: 169 mg/dL — ABNORMAL HIGH (ref 70–99)

## 2013-06-01 MED ORDER — METOLAZONE 2.5 MG PO TABS: 2.5000 mg | ORAL_TABLET | Freq: Every day | ORAL | Status: DC

## 2013-06-01 MED ORDER — METOLAZONE 2.5 MG PO TABS
2.5000 mg | ORAL_TABLET | Freq: Two times a day (BID) | ORAL | Status: DC
  Administered 2013-06-01 – 2013-06-04 (×7): 2.5 mg via ORAL
  Filled 2013-06-01 (×9): qty 1

## 2013-06-01 NOTE — Progress Notes (Signed)
Pt complaining of chest pain 7/10. EKG done MD notified. Will continue to assess and monitor.

## 2013-06-01 NOTE — Progress Notes (Signed)
PATIENT DETAILS Name: Miranda Cruz Age: 79 y.o. Sex: female Date of Birth: 12-15-1929 Admit Date: 05/29/2013 Admitting Physician Dorothea Ogle, MD WUJ:[id number] R, MD  Subjective: Shortness of breath much better  Assessment/Plan: Principal Problem:   Acute respiratory failure with hypoxia -better, now off BiPAP -secondary to Acute systolic heart failure  Active Problems:   Acute on chronic systolic CHF (congestive heart failure) -both CHF team/Cards following -on lasix gtt, beta blocker -not on ACEI given CKD  Pleural Effusion -from CHF -no significant Pl effusion-right pleurex catheter in place  Hypothyroidism -c/w Levothyroxine  DM -CBG's  -SSI  Anemia -due to CKD    CKD (chronic kidney disease) stage 3- 4, GFR 15-29 ml/min -creatinine close to her usual baseline-infact better than her usual range  Disposition: Remain inpatient  DVT Prophylaxis: Prophylactic  Heparin  Code Status: DNI-claims she has talked to her family as well.  Family Communication None at bedside  Procedures:  None  CONSULTS:  cardiology   MEDICATIONS: Scheduled Meds: . allopurinol  100 mg Oral q morning - 10a  . ALPRAZolam  1 mg Oral QHS  . aspirin EC  81 mg Oral QHS  . atorvastatin  40 mg Oral QHS  . guaiFENesin  600 mg Oral BID  . heparin subcutaneous  5,000 Units Subcutaneous Q8H  . insulin aspart  0-9 Units Subcutaneous TID WC  . levothyroxine  125 mcg Oral QAC breakfast  . metolazone  2.5 mg Oral BID  . metoprolol succinate  12.5 mg Oral Daily  . pantoprazole  40 mg Oral Daily  . polyethylene glycol  17 g Oral Daily  . sevelamer carbonate  800 mg Oral BID WC  . sodium chloride  3 mL Intravenous Q12H   Continuous Infusions: . furosemide (LASIX) infusion 15 mg/hr (06/01/13 1028)   PRN Meds:.sodium chloride, acetaminophen, feeding supplement, fluticasone, HYDROmorphone (DILAUDID) injection, levalbuterol, nitroGLYCERIN, ondansetron (ZOFRAN) IV, ondansetron,  oxyCODONE, sodium chloride  Antibiotics: Anti-infectives   None       PHYSICAL EXAM: Vital signs in last 24 hours: Filed Vitals:   06/01/13 1000 06/01/13 1223 06/01/13 1400 06/01/13 1629  BP: 115/69 134/44 111/36 123/59  Pulse: 70  70 70  Temp:  98.6 F (37 C)  98.4 F (36.9 C)  TempSrc:  Oral  Oral  Resp: 25 24 24 22   Height:      Weight:      SpO2: 93%  93% 99%    Weight change: 1.4 kg (3 lb 1.4 oz) Filed Weights   05/31/13 0500 05/31/13 1200 06/01/13 0500  Weight: 84.7 kg (186 lb 11.7 oz) 84.7 kg (186 lb 11.7 oz) 83.9 kg (184 lb 15.5 oz)   Body mass index is 35.65 kg/(m^2).   Gen Exam: Awake and alert with clear speech.   Neck: Supple, No JVD.   Chest: B/L Clear-except bibasilar rales CVS: S1 S2 Regular, no murmurs.  Abdomen: soft, BS +, non tender, non distended.  Extremities: 1+ edema, lower extremities warm to touch. Neurologic: Non Focal.  Skin: No Rash.   Wounds: N/A.    Intake/Output from previous day:  Intake/Output Summary (Last 24 hours) at 06/01/13 1818 Last data filed at 06/01/13 1800  Gross per 24 hour  Intake    905 ml  Output   3450 ml  Net  -2545 ml     LAB RESULTS: CBC  Recent Labs Lab 05/29/13 2056 05/30/13 0630 05/31/13 0340 06/01/13 0740  WBC 8.2 7.3  --  10.3  HGB 12.0  10.6* 10.7* 10.4*  HCT 37.8 34.1* 35.2* 33.8*  PLT 259 248  --  211  MCV 93.1 93.7  --  94.4  MCH 29.6 29.1  --  29.1  MCHC 31.7 31.1  --  30.8  RDW 15.5 15.6*  --  15.3  LYMPHSABS  --   --   --  1.1  MONOABS  --   --   --  0.8  EOSABS  --   --   --  0.1  BASOSABS  --   --   --  0.0    Chemistries   Recent Labs Lab 05/29/13 2056 05/30/13 0630 05/31/13 0340 06/01/13 0740  NA 139 140 142 138  K 4.5 4.1 4.6 4.0  CL 103 102 103 98  CO2 26 31 30  34*  GLUCOSE 155* 109* 110* 131*  BUN 44* 40* 42* 40*  CREATININE 1.36* 1.33* 1.53* 1.36*  CALCIUM 8.6 8.6 8.3* 8.4    CBG:  Recent Labs Lab 05/31/13 1633 05/31/13 2202 06/01/13 0740  06/01/13 1219 06/01/13 1757  GLUCAP 143* 159* 131* 197* 147*    GFR Estimated Creatinine Clearance: 32 ml/min (by C-G formula based on Cr of 1.36).  Coagulation profile No results found for this basename: INR, PROTIME,  in the last 168 hours  Cardiac Enzymes  Recent Labs Lab 05/30/13 1424 05/30/13 2111 05/31/13 0340  TROPONINI <0.30 <0.30 <0.30    No components found with this basename: POCBNP,  No results found for this basename: DDIMER,  in the last 72 hours No results found for this basename: HGBA1C,  in the last 72 hours No results found for this basename: CHOL, HDL, LDLCALC, TRIG, CHOLHDL, LDLDIRECT,  in the last 72 hours  Recent Labs  05/30/13 1531  TSH 2.912    Recent Labs  05/31/13 0340  VITAMINB12 479  FOLATE 12.5  FERRITIN 59  TIBC 252  IRON 22*   No results found for this basename: LIPASE, AMYLASE,  in the last 72 hours  Urine Studies No results found for this basename: UACOL, UAPR, USPG, UPH, UTP, UGL, UKET, UBIL, UHGB, UNIT, UROB, ULEU, UEPI, UWBC, URBC, UBAC, CAST, CRYS, UCOM, BILUA,  in the last 72 hours  MICROBIOLOGY: Recent Results (from the past 240 hour(s))  MRSA PCR SCREENING     Status: None   Collection Time    05/30/13  4:14 PM      Result Value Range Status   MRSA by PCR NEGATIVE  NEGATIVE Final   Comment:            The GeneXpert MRSA Assay (FDA     approved for NASAL specimens     only), is one component of a     comprehensive MRSA colonization     surveillance program. It is not     intended to diagnose MRSA     infection nor to guide or     monitor treatment for     MRSA infections.    RADIOLOGY STUDIES/RESULTS: Dg Chest 2 View  05/29/2013   CLINICAL DATA:  Shortness of breath and cough for 3 weeks.  EXAM: CHEST  2 VIEW  COMPARISON:  PA and lateral chest and right side down decubitus view of the chest 05/22/2013.  FINDINGS: Small to moderate bilateral pleural effusions and basilar airspace disease, both worse on the left,  are again seen. Basilar airspace disease and the effusions appear slightly worsened. There is cardiomegaly and pulmonary vascular congestion. No pneumothorax is identified.  IMPRESSION: Some  worsening of small to moderate bilateral pleural effusions and basilar airspace disease, worse on the left.  Cardiomegaly and pulmonary vascular congestion.   Electronically Signed   By: Drusilla Kanner M.D.   On: 05/29/2013 21:32   Dg Chest 2 View  05/22/2013   CLINICAL DATA:  Shortness of breath, cough, congestion. Pleural fluid, drain in place.  EXAM: CHEST  2 VIEW  COMPARISON:  05/10/2013  FINDINGS: Left pacer is in place. Cardiomegaly with vascular congestion. Suspect mild interstitial edema. Bibasilar atelectasis. Small left pleural effusion, increased since prior study. Right pleural drain in place without significant right effusion or pneumothorax.  IMPRESSION: Small left pleural effusion. Right pleural drain in place without visible significant effusion or pneumothorax.  Cardiomegaly, bibasilar atelectasis. Suspect mild pulmonary edema.   Electronically Signed   By: Charlett Nose M.D.   On: 05/22/2013 13:42   Dg Chest 2 View  05/10/2013   CLINICAL DATA:  79 year old female with pleural effusion.  EXAM: CHEST  2 VIEW  COMPARISON:  05/08/2013 and earlier.  FINDINGS: Seated AP and lateral views of the chest. Moderate right and small left pleural effusions have not significantly changed. Ventilation at the right lung base appears mildly decreased. Stable cardiac size and mediastinal contours. No pneumothorax. Pulmonary vascularity appears decreased. No overt edema. Stable visualized osseous structures. Left chest cardiac pacemaker.  IMPRESSION: All moderate right and small left pleural effusions not significantly changed. Mildly decreased ventilation at the right lung base. Interval decreased pulmonary vascular congestion.   Electronically Signed   By: Augusto Gamble M.D.   On: 05/10/2013 19:00   Dg Chest 2  View  05/08/2013   *RADIOLOGY REPORT*  Clinical Data: Recurrent pleural effusion.  Cough.  Short of breath.  CHEST - 2 VIEW  Comparison: 05/08/2013, 0851 hours.  Findings: Dual lead left subclavian pacemaker.  Cardiomegaly.  Low lung volumes.  Small to moderate bilateral pleural effusions and basilar atelectasis. There is pulmonary vascular congestion, interstitial and mild alveolar pulmonary edema.  No pneumothorax.  Compared to the exam earlier today, aeration is slightly improved, likely representing interval diuresis.  IMPRESSION: Moderate CHF.   Original Report Authenticated By: Andreas Newport, M.D.   Dg Chest 2 View  05/08/2013   *RADIOLOGY REPORT*  Clinical Data: Evaluate right-sided pleural effusion after thoracentesis.  CHEST - 2 VIEW  Comparison: Chest x-ray 05/06/2013.  Findings: There is an unusual lucency both medially and laterally in the inferior right hemithorax on the frontal projection, which is suspicious for potential small pneumothorax, however, there is a right pleural effusion which is small, and this effusion does not appear to layer horizontally as would typically be seen in the setting of hydropneumothorax.  Small left pleural effusion is also noted.  Lung volumes are low, with bibasilar opacities which may reflect areas of atelectasis and/or consolidation.  Cephalization of the pulmonary vasculature, without frank pulmonary edema.  Heart size appears mildly enlarged.  Mediastinal contours are unremarkable.  Atherosclerosis of the thoracic aorta.  Left-sided pacemaker device with lead tips projecting over the expected location of the right atrium and right ventricular apex.  IMPRESSION: 1.  Unusual appearance of the right hemithorax, favored to reflect a some skin fold artifact.  Alternatively, a small right hydropneumothorax post thoracentesis is difficult to entirely exclude.  Attention on a follow-up study is suggested, preferably a standing PA and lateral chest radiograph to exclude  pneumothorax. 2.  Small left pleural effusion. 3.  Cardiomegaly with pulmonary venous congestion, but no frank pulmonary edema.  4.  Bibasilar atelectasis and/or consolidation. 5.  Atherosclerosis.  These results were called by telephone on 05/08/2013 at 09:10 a.m. to nurse Lupita Leash (for Dr. Gala Romney), who verbally acknowledged these results.   Original Report Authenticated By: Trudie Reed, M.D.   Dg Chest 2 View  05/04/2013   *RADIOLOGY REPORT*  Clinical Data: Follow up pleural effusion.  CHEST - 2 VIEW  Comparison: 04/28/2013  Findings: Moderate to large right pleural effusion is now present, reaccumulated from the prior exam.  There is perihilar opacity on the right that is likely atelectasis.  There is a minimal left pleural effusion.  There is no overt pulmonary edema.  The cardiac silhouette is mildly enlarged.  There is no pneumothorax.  The left anterior chest wall sequential pacemaker is stable well- positioned.  IMPRESSION: Moderate to large right pleural effusion, which has reaccumulated since the prior chest radiograph.  There is associated right perihilar lung base atelectasis.  No overt pulmonary edema. Minimal left pleural effusion.   Original Report Authenticated By: Amie Portland, M.D.   Ct Chest Wo Contrast  05/06/2013   *RADIOLOGY REPORT*  Clinical Data: status post thoracentesis, evaluate for cause of recurrent pleural effusion  CT CHEST WITHOUT CONTRAST  Technique:  Multidetector CT imaging of the chest was performed following the standard protocol without IV contrast.  Comparison: multiple recent chest radiographs  Findings: The study is limited without contrast.  There are small calcified left hilar lymph nodes.  There is coronary arterial and aortic arch calcification.  Cardiac pacer leads are identified with generator over the left thorax.  The heart is enlarged.  There is a small to moderate right pleural effusion.  There is a small left pleural effusion.  There is no pneumothorax.   There is some dense material in consolidated posterior right lower lobe.  This could represent aspirated material.  Bilaterally there is dependent atelectasis.  On the right, there is a focus of irregular hazy airspace disease in the upper lobe on image number 20, measuring 21 x 13 mm.  There is more diffuse ground-glass attenuation throughout the inferior right middle lobe and more severely in the right lower lobe.  On the left, and series 3 image number 26, there is a 1.4 cm nodular density posteriorly in the left lower lobe, but viewing this on axial, coronal, and sagittal images suggests that it is related to subsegmental atelectasis rather than a lung mass.  There is a calcified granuloma measuring 5 mm in the lingula.  There is a very mild ground-glass attenuation in the left upper lobe.  Scans through the uppermost abdomen and unremarkable.  IMPRESSION: Study is limited without contrast, but there is no specific abnormality to account for recurrent pleural effusion.  Effusions are bilateral, but larger on the right.  Dense material in consolidated right lower lobe suggests possibility of aspiration.  There is bilateral ground glass attenuation, but it is minimal on the left and much more prominent on the right.  This is nonspecific fidning, but given the asymmetry, it likely represents re-expansion pulmonary edema following thoracentesis.  Finally, there is an approximately 2cm focus of irregular hazy opacity in the right upper lobe, entirely nonspecific. Abnormalities such as atelectasis, focal edema/hemorrhage, or even adenocarcinoma can appear like this, and this should receive followup imaging to reassess.   Original Report Authenticated By: Esperanza Heir, M.D.   Dg Chest Right Decubitus  05/22/2013   CLINICAL DATA:  Pleural fluid, drain.  EXAM: CHEST - RIGHT DECUBITUS  COMPARISON:  Chest x-ray performed today and 05/10/2013  FINDINGS: A right side down decubitus view demonstrates moderate layering  right pleural effusion. Right pleural drain in place.  IMPRESSION: Moderate layering right pleural effusion.   Electronically Signed   By: Charlett Nose M.D.   On: 05/22/2013 13:42   Dg Chest Port 1 View  05/30/2013   CLINICAL DATA:  Short of breath  EXAM: PORTABLE CHEST - 1 VIEW  COMPARISON:  05/29/2013  FINDINGS: Cardiac enlargement with vascular congestion and mild edema. Increasing bilateral effusions and bibasilar atelectasis.  IMPRESSION: Congestive heart failure with progression of vascular congestion and bilateral effusions.   Electronically Signed   By: Marlan Palau M.D.   On: 05/30/2013 16:34   Dg Chest Port 1 View  05/06/2013   *RADIOLOGY REPORT*  Clinical Data: Status post right thoracentesis.  PORTABLE CHEST - 1 VIEW  Comparison: 05/04/2013  Findings: There is been significant reduction in the right-sided pleural effusion.  No pneumothorax is identified.  A lucency is noted over the right apex which is related to a skin fold.    A pacing device is again seen.  No other focal abnormality is noted.  IMPRESSION: No pneumothorax following thoracentesis.  Significant reduction in right effusion is noted.   Original Report Authenticated By: Alcide Clever, M.D.   Ir Perc Pleural Drain W/indwell Cath W/img Guide  05/11/2013   *RADIOLOGY REPORT*  Clinical Data:  Recurrent symptomatic right-sided pleural effusion in patient with history of recurrent CHF, now seeking palliative therapy  INSERTION OF TUNNELED RIGHT SIDED PLEURAL DRAINAGE CATHETER  Comparison:  Ultrasound guided thoracentesis - 04/28/2013; chest radiograph - 05/10/2013; 05/08/2013; chest CT - 05/06/2013  Intravenous medications: Versed 2 mg IV; Fentanyl 75 mcg IV; Ancef 2 gram IV; Antibiotic was administered in an appropriate time interval for the procedure.  Total Moderate Sedation Time: 15 minutes.  Fluoroscopy time:  36 seconds  Complications:  None immediate  Procedure:  The procedure, risks, benefits, and alternatives were explained to  the patient and the patient's daughter, who wish to proceed with the placement of this permanent pleural catheter as the patient is seeking palliative care.  The patient and the patient's wife understand and consent to the procedure.  The right lateral chest and right upper abdomen were prepped with Chlorhexidine in a sterile fashion, and a sterile drape was applied covering the operative field.  A sterile gown and sterile gloves were used for the procedure.  Initial ultrasound scanning and fluoroscopic imaging demonstrates a recurrent moderate to large right-sided pleural effusion.  Under direct ultrasound guidance, the right inferior lateral pleural space was accessed with an 19 gauge trocar needle after the overlying soft tissues were anesthetized with 1% lidocaine with epinephrine. An Amplatz superstiff wire was then advanced under fluoroscopy into the pleural space.  A 16 French tunneled pleural catheter was tunneled from an incision within the right upper abdominal quadrant to the access site. The pleural access site was serially dilated under fluoroscopy, ultimately allowing placement of a peel-away sheath.  The catheter was advanced through the peel-away sheath.  The sheath was then removed.  Final catheter positioning was confirmed with a fluoroscopic radiographic image.  The access incision was closed with subcutaneous subcuticular 4-0 Vicryl, Dermabond and Steri-Strips.  A Prolene retention suture was applied at the catheter exit site.  Large volume thoracentesis was performed through the new catheter utilizing provided bulb vacuum assisted drainage bag.  The patient tolerated the above procedure well without immediate postprocedural complication.  Findings:  The catheter was placed via the right lateral chest wall.  Catheter course is towards the apex.  Approximately 1.3 liters of serous pleural fluid was able to be removed after catheter placement.  IMPRESSION:  Successful placement of permanent,  tunneled right pleural drainage catheter via lateral approach.  Approximately 1.3 liters of serous pleural fluid was removed after catheter placement.   Original Report Authenticated By: Tacey Ruiz, MD    Jeoffrey Massed, MD  [facility] Pager:[phone]  If 7PM-7AM, please contact night-coverage [email or web address] Password [email or web address] 06/01/2013, 6:18 PM   LOS: 3 days

## 2013-06-01 NOTE — Progress Notes (Signed)
PULMONARY  / CRITICAL CARE MEDICINE  Name: Miranda Cruz MRN: [id number] DOB: [birth year]    ADMISSION DATE:  05/29/2013 CONSULTATION DATE:  05/31/13  REFERRING MD :  Armanda Magic, MD, [facility] SERVICE: cards  CHIEF COMPLAINT:  Distress  BRIEF PATIENT DESCRIPTION: 82 yr CHF, pleurex rt and distress  SIGNIFICANT EVENTS / STUDIES:  9/23- SOB, wt gain, acidotic  LINES / TUBES: 8./28 admission pleurex rt>>> 9/24- called distress, pleurex catheter limited drainage  CULTURES: None  ANTIBIOTIC: None  SUBJECTIVE:  Sob improved  VITAL SIGNS: Temp:  [97.4 F (36.3 C)-98.3 F (36.8 C)] 98.3 F (36.8 C) (09/25 0800) Pulse Rate:  [70] 70 (09/25 0800) Resp:  [13-28] 13 (09/25 0800) BP: (108-138)/(35-80) 114/35 mmHg (09/25 0800) SpO2:  [94 %-97 %] 94 % (09/25 0800) Weight:  [83.9 kg (184 lb 15.5 oz)-84.7 kg (186 lb 11.7 oz)] 83.9 kg (184 lb 15.5 oz) (09/25 0500) HEMODYNAMICS:   VENTILATOR SETTINGS:   INTAKE / OUTPUT: Intake/Output     09/24 0701 - 09/25 0700 09/25 0701 - 09/26 0700   P.O.  240   I.V. (mL/kg) 455 (5.4) 15 (0.2)   Total Intake(mL/kg) 455 (5.4) 255 (3)   Urine (mL/kg/hr) 2750 (1.4)    Total Output 2750     Net -2295 +255          PHYSICAL EXAMINATION: General:  Awake, mild distress Neuro:  Nonfocal, good strength uppers, perrl HEENT:  jvd up mod Cardiovascular:  s1 s2 RRR distant Lungs:  Coarse bases Abdomen:  Soft, mild distentiojn, no r/g Skin: no rash ext- edema 2 plus pitting  LABS:  CBC Recent Labs     05/29/13  2056  05/30/13  0630  05/31/13  0340  06/01/13  0740  WBC  8.2  7.3   --   10.3  HGB  12.0  10.6*  10.7*  10.4*  HCT  37.8  34.1*  35.2*  33.8*  PLT  259  248   --   211   Coag's No results found for this basename: APTT, INR,  in the last 72 hours BMET Recent Labs     05/30/13  0630  05/31/13  0340  06/01/13  0740  NA  140  142  138  K  4.1  4.6  4.0  CL  102  103  98  CO2  31  30  34*  BUN  40*  42*  40*   CREATININE  1.33*  1.53*  1.36*  GLUCOSE  109*  110*  131*   Electrolytes Recent Labs     05/30/13  0630  05/31/13  0340  06/01/13  0740  CALCIUM  8.6  8.3*  8.4   Sepsis Markers No results found for this basename: LACTICACIDVEN, PROCALCITON, O2SATVEN,  in the last 72 hours ABG Recent Labs     05/30/13  1450  PHART  7.293*  PCO2ART  64.3*  PO2ART  79.0*   Liver Enzymes No results found for this basename: AST, ALT, ALKPHOS, BILITOT, ALBUMIN,  in the last 72 hours Cardiac Enzymes Recent Labs     05/29/13  2056  05/30/13  1424  05/30/13  2111  05/31/13  0340  TROPONINI   --   <0.30  <0.30  <0.30  PROBNP  9064.0*  8898.0*   --    --    Glucose Recent Labs     05/30/13  1701  05/30/13  2303  05/31/13  0750  05/31/13  1145  05/31/13  1633  05/31/13  2202  GLUCAP  197*  151*  142*  161*  143*  159*    Imaging Dg Chest Port 1 View  06/01/2013   CLINICAL DATA:  Cough and congestion  EXAM: PORTABLE CHEST - 1 VIEW  COMPARISON:  05/30/2013.  FINDINGS: Unchanged positioning of dual-chamber left approach pacer. Right-sided pleural drain in stable position.  No change in cardiomegaly. Haziness of the bilateral chest consistent with effusions, unchanged. Air bronchograms noted at the right base. No pneumothorax or edema.  IMPRESSION: 1. Stable volume bilateral pleural effusions. Right-sided pleural drain in stable position. 2. Bibasilar atelectasis or consolidation.   Electronically Signed   By: Tiburcio Pea   On: 06/01/2013 05:54   Dg Chest Port 1 View  05/30/2013   CLINICAL DATA:  Short of breath  EXAM: PORTABLE CHEST - 1 VIEW  COMPARISON:  05/29/2013  FINDINGS: Cardiac enlargement with vascular congestion and mild edema. Increasing bilateral effusions and bibasilar atelectasis.  IMPRESSION: Congestive heart failure with progression of vascular congestion and bilateral effusions.   Electronically Signed   By: Marlan Palau M.D.   On: 05/30/2013 16:34     CXR: int edema,  effusions bibasilar, small lung volumes  ASSESSMENT / PLAN:  PULMONARY A: CHF exacerbation, bilateral effusions, r/o malfunctioning pleurex, resp failure, new small effusion left P:   - Much improved with diureses, continue per cards. - No need for repeat ABG at this point, will d/c. - Patient wishes for LCB with no CPR/intubation. - CXR with improvement, no need to repeat at this time since respiratory status is much improved. - Pleurex functioning, drainage as needed but no significant pleural effusion noted at this time.  Continue treatment of CHF, no thora. - Lasix per chf service - D/C BiPAP. - Pulmonary hygienes. - Titrate O2 as needed.  CARDIOVASCULAR A: chf P:  - Per chf and cards - Diureses.  RENAL A:  Mild rise crt P:   - Changed off lovenox to sub q heparin, continue. - Lasix - Chem in am  - Replace electrolytes as needed.  TODAY'S SUMMARY: CHF exacerbation, no further pulmonary interventions needed at this time.  No intubation.  Primary to change code status.  PCCM will sign off, please call back if needed.  I have personally obtained a history, examined the patient, evaluated laboratory and imaging results, formulated the assessment and plan and placed orders.  Alyson Reedy, M.D. [facility] Pulmonary/Critical Care Medicine. Pager: [phone]. After hours pager: [phone].

## 2013-06-01 NOTE — Progress Notes (Signed)
Advanced Heart Failure Rounding Note   Subjective:    Miranda Cruz is an 79 year old with a history of chronic systolic heart failure EF 35%, ICM, CKD baseline creatinine 1.8, A fib on eliquis, DM, hypothyroid, and recurrent pleural effusion with pleurex catheter.   Yesterday she placed on a lasix drip at 10 mg per hour. Weight down 2 pounds. 24 hour I/O - 2.3 liters. Complaining of fatigue and dyspnea.  Now 185 pounds. (Baseline weight ~ 175)  CXR- bilateral pleural effusions L>R. Bibasilar atelectasis or consolidation. No drainage from Pleurex  Renal function improving  Has decided on limited Code (No intubation or CPR)   Objective:   Weight Range:  Vital Signs:   Temp:  [97.4 F (36.3 C)-98.2 F (36.8 C)] 98.1 F (36.7 C) (09/25 0400) Pulse Rate:  [69-70] 70 (09/25 0400) Resp:  [19-28] 24 (09/25 0400) BP: (102-138)/(41-80) 127/56 mmHg (09/25 0400) SpO2:  [94 %-100 %] 96 % (09/25 0400) Weight:  [184 lb 15.5 oz (83.9 kg)-186 lb 11.7 oz (84.7 kg)] 184 lb 15.5 oz (83.9 kg) (09/25 0500)    Weight change: Filed Weights   05/31/13 0500 05/31/13 1200 06/01/13 0500  Weight: 186 lb 11.7 oz (84.7 kg) 186 lb 11.7 oz (84.7 kg) 184 lb 15.5 oz (83.9 kg)    Intake/Output:   Intake/Output Summary (Last 24 hours) at 06/01/13 0710 Last data filed at 06/01/13 0600  Gross per 24 hour  Intake    440 ml  Output   2750 ml  Net  -2310 ml     Physical Exam: General:  Elderly chronically ill appearing. No resp difficulty HEENT: normal Neck: supple. JVP to ear. Carotids 2+ bilat; no bruits. No lymphadenopathy or thryomegaly appreciated. Cor: PMI nondisplaced. Regular rate & rhythm. No rubs, gallops or murmurs. Lungs: Rhonchi thoughout. R pleurex catheter Abdomen: soft, nontender, nondistended. No hepatosplenomegaly. No bruits or masses. Good bowel sounds. Extremities: no cyanosis, clubbing, rash, RLE and LLE 2+ edema Neuro: alert & orientedx3, cranial nerves grossly intact. moves all 4  extremities w/o difficulty. Affect pleasant  Telemetry: A fib 90s Labs: Basic Metabolic Panel:  Recent Labs Lab 05/29/13 2056 05/30/13 0630 05/31/13 0340  NA 139 140 142  K 4.5 4.1 4.6  CL 103 102 103  CO2 26 31 30   GLUCOSE 155* 109* 110*  BUN 44* 40* 42*  CREATININE 1.36* 1.33* 1.53*  CALCIUM 8.6 8.6 8.3*    Liver Function Tests: No results found for this basename: AST, ALT, ALKPHOS, BILITOT, PROT, ALBUMIN,  in the last 168 hours No results found for this basename: LIPASE, AMYLASE,  in the last 168 hours No results found for this basename: AMMONIA,  in the last 168 hours  CBC:  Recent Labs Lab 05/29/13 2056 05/30/13 0630 05/31/13 0340  WBC 8.2 7.3  --   HGB 12.0 10.6* 10.7*  HCT 37.8 34.1* 35.2*  MCV 93.1 93.7  --   PLT 259 248  --     Cardiac Enzymes:  Recent Labs Lab 05/30/13 1424 05/30/13 2111 05/31/13 0340  TROPONINI <0.30 <0.30 <0.30    BNP: BNP (last 3 results)  Recent Labs  05/22/13 1512 05/29/13 2056 05/30/13 1424  PROBNP 11291.0* 9064.0* 8898.0*     Other results:   Imaging: Dg Chest Port 1 View  06/01/2013   CLINICAL DATA:  Cough and congestion  EXAM: PORTABLE CHEST - 1 VIEW  COMPARISON:  05/30/2013.  FINDINGS: Unchanged positioning of dual-chamber left approach pacer. Right-sided pleural drain in stable  position.  No change in cardiomegaly. Haziness of the bilateral chest consistent with effusions, unchanged. Air bronchograms noted at the right base. No pneumothorax or edema.  IMPRESSION: 1. Stable volume bilateral pleural effusions. Right-sided pleural drain in stable position. 2. Bibasilar atelectasis or consolidation.   Electronically Signed   By: Tiburcio Pea   On: 06/01/2013 05:54   Dg Chest Port 1 View  05/30/2013   CLINICAL DATA:  Short of breath  EXAM: PORTABLE CHEST - 1 VIEW  COMPARISON:  05/29/2013  FINDINGS: Cardiac enlargement with vascular congestion and mild edema. Increasing bilateral effusions and bibasilar  atelectasis.  IMPRESSION: Congestive heart failure with progression of vascular congestion and bilateral effusions.   Electronically Signed   By: Marlan Palau M.D.   On: 05/30/2013 16:34      Medications:     Scheduled Medications: . allopurinol  100 mg Oral q morning - 10a  . ALPRAZolam  1 mg Oral QHS  . aspirin EC  81 mg Oral QHS  . atorvastatin  40 mg Oral QHS  . guaiFENesin  600 mg Oral BID  . heparin subcutaneous  5,000 Units Subcutaneous Q8H  . insulin aspart  0-9 Units Subcutaneous TID WC  . levothyroxine  125 mcg Oral QAC breakfast  . metoprolol succinate  12.5 mg Oral Daily  . pantoprazole  40 mg Oral Daily  . polyethylene glycol  17 g Oral Daily  . sevelamer carbonate  800 mg Oral BID WC  . sodium chloride  3 mL Intravenous Q12H     Infusions: . furosemide (LASIX) infusion 10 mg/hr (05/31/13 2000)     PRN Medications:  sodium chloride, acetaminophen, feeding supplement, fluticasone, HYDROmorphone (DILAUDID) injection, levalbuterol, nitroGLYCERIN, ondansetron (ZOFRAN) IV, ondansetron, oxyCODONE, sodium chloride   Assessment:  1.A/C respiratory failure  2. A/C chronic systolic heart failure EF 35%  3. R pleural effusion with pluerex catheter  4. CKD creatinine baseline 1.5  5. Hypothyroid  6. DM  7. Cardiorenal syndrome with CHF  8. AF, chronic  9. Limited Code - No intubation or CPR. 1 shock and inotropes ok.     Plan/Discussion:    Volume status remains elevated. Continue lasix drip at 10 mg per hour. Continue metoprolol succinate 12.5 mg daily. BMET pending.   Consult PT.   Length of Stay: 3 Miranda Cruz,Miranda Cruz 06/01/2013, 7:10 AM Advanced Heart Failure Team Pager [phone] (M-F; 7a - 4p)  Please contact [facility] for night-coverage after hours (4p -7a ) and weekends on [email or web address]  Patient seen and examined with Tonye Becket, NP. We discussed all aspects of the encounter. I agree with the assessment and plan as stated above. She is diuresing only  modestly. Renal function better. Will add metolazone 2.5 bid and increase lasix to 15. Place TED hose. Now limited code. Plan will be to diurese and try to her home with very close f/u with [facility] and HF clinic.  Daniel Bensimhon,MD 9:51 AM

## 2013-06-01 NOTE — ED Provider Notes (Signed)
Medical screening examination/treatment/procedure(s) were conducted as a shared visit with non-physician practitioner(s) and myself.  I personally evaluated the patient during the encounter.  Patient with shortness of breath. She has an indwelling pleural catheter for persistent pleural effusions. Unable to get catheter working at bedside. She is hypoxic on room air. She does not have a home oxygen requirement. Will admit for observation overnight until catheter can be troubleshot by interventional radiology in the morning.  Raeford Razor, MD 06/01/13 [phone]

## 2013-06-01 NOTE — Progress Notes (Signed)
Appreciate team approach.  Feels better  Decided against intubation and CPR but would like one shock/ defib if VT. Limited code.   Lasix drip - net out 3.7 liters.  Patient Active Problem List   Diagnosis Date Noted  . Cardiac pacemaker in situ 05/01/2013  . Hyposmolality and/or hyponatremia 05/01/2013  . Acute on chronic systolic CHF (congestive heart failure) 04/30/2013  . Pleural effusion - right 04/27/2013  . Acute respiratory failure with hypoxia 04/27/2013  . CAP (community acquired pneumonia) 02/08/2013  . Hemoptysis 02/08/2013  . ARF (acute renal failure) 01/31/2013  . CKD (chronic kidney disease) stage 4, GFR 15-29 ml/min 01/31/2013  . Metabolic acidosis 01/31/2013  . DM (diabetes mellitus) 01/31/2013  . Volume overload 01/31/2013  . Cardiomyopathy, ischemic 01/31/2013  . CAD (coronary artery disease) 01/31/2013  . Hypotension 01/31/2013  . Encephalopathy acute 01/31/2013  . Hyperkalemia 01/31/2013

## 2013-06-02 ENCOUNTER — Inpatient Hospital Stay: Payer: Medicare Other | Admitting: Emergency Medicine

## 2013-06-02 LAB — BASIC METABOLIC PANEL
BUN: 44 mg/dL — ABNORMAL HIGH (ref 6–23)
Calcium: 8.7 mg/dL (ref 8.4–10.5)
Calcium: 8.7 mg/dL (ref 8.4–10.5)
Chloride: 95 mEq/L — ABNORMAL LOW (ref 96–112)
GFR calc Af Amer: 38 mL/min — ABNORMAL LOW (ref >90)
GFR calc Af Amer: 38 mL/min — ABNORMAL LOW (ref >90)
GFR calc non Af Amer: 33 mL/min — ABNORMAL LOW (ref >90)
GFR calc non Af Amer: 32 mL/min — ABNORMAL LOW (ref >90)
Glucose, Bld: 114 mg/dL — ABNORMAL HIGH (ref 70–99)
Glucose, Bld: 141 mg/dL — ABNORMAL HIGH (ref 70–99)
Potassium: 3.9 mEq/L (ref 3.5–5.1)
Sodium: 141 mEq/L (ref 135–145)

## 2013-06-02 LAB — GLUCOSE, CAPILLARY
Glucose-Capillary: 116 mg/dL — ABNORMAL HIGH (ref 70–99)
Glucose-Capillary: 207 mg/dL — ABNORMAL HIGH (ref 70–99)

## 2013-06-02 MED ORDER — BISACODYL 10 MG RE SUPP
10.0000 mg | Freq: Every day | RECTAL | Status: DC | PRN
  Administered 2013-06-02: 19:00:00 10 mg via RECTAL
  Filled 2013-06-02: qty 1

## 2013-06-02 NOTE — Progress Notes (Signed)
Report to [facility]; pt transferred via RN and telemetry

## 2013-06-02 NOTE — Progress Notes (Signed)
PATIENT DETAILS Name: Miranda Cruz Age: 79 y.o. Sex: female Date of Birth: 1930/05/06 Admit Date: 05/29/2013 Admitting Physician Dorothea Ogle, MD ZOX:[id number] R, MD  Brief Summary: Patient is a 79 year old female with history of chronic systolic heart failure, recurrent pleural effusion with right pleurex Drain in Place, with numerous recent admission for decompensated heart failure, presented to the hospital with shortness of breath. Her symptoms were consistent with decompensated heart failure, during the hospital course she developed respiratory failure with hypoxia and was transferred to the step down unit and was placed on BiPAP. With further aggressive diureses, she clinically improved and has been off the BiPAP for the past 2 days. She is now being transferred to a telemetry unit, both the heart failure team and cardiology has been consulted.  Subjective: Shortness of breath much better  Assessment/Plan: Principal Problem:   Acute respiratory failure with hypoxia -better, now off BiPAP, still requiring 02 via [place]-titrate off slowly -secondary to Acute systolic heart failure  Active Problems:   Acute on chronic systolic CHF (congestive heart failure) -volume status much better-weight decreasing -both CHF team/Cards following -on lasix gtt, beta blocker -not on ACEI given CKD  Pleural Effusion -from CHF -no significant Pl effusion-right pleurex catheter in place-but not draining-hence capped  Hypothyroidism -c/w Levothyroxine  DM -CBG's stable -c/w SSI  Anemia -due to CKD -monitor H/H periodically    CKD (chronic kidney disease) stage 3- 4, GFR 15-29 ml/min -creatinine close to her usual baseline-infact better than her usual range -monitor creatinine while on lasix  Disposition: Remain inpatient-but transfer to Telemetry  DVT Prophylaxis: Prophylactic  Heparin  Code Status: DNI-claims she has talked to her family as well.  Family Communication None at  bedside  Procedures:  None  CONSULTS:  cardiology   MEDICATIONS: Scheduled Meds: . allopurinol  100 mg Oral q morning - 10a  . ALPRAZolam  1 mg Oral QHS  . aspirin EC  81 mg Oral QHS  . atorvastatin  40 mg Oral QHS  . guaiFENesin  600 mg Oral BID  . heparin subcutaneous  5,000 Units Subcutaneous Q8H  . insulin aspart  0-9 Units Subcutaneous TID WC  . levothyroxine  125 mcg Oral QAC breakfast  . metolazone  2.5 mg Oral BID  . metoprolol succinate  12.5 mg Oral Daily  . pantoprazole  40 mg Oral Daily  . polyethylene glycol  17 g Oral Daily  . sevelamer carbonate  800 mg Oral BID WC  . sodium chloride  3 mL Intravenous Q12H   Continuous Infusions: . furosemide (LASIX) infusion 15 mg/hr (06/02/13 0402)   PRN Meds:.sodium chloride, acetaminophen, feeding supplement, fluticasone, HYDROmorphone (DILAUDID) injection, levalbuterol, nitroGLYCERIN, ondansetron (ZOFRAN) IV, ondansetron, oxyCODONE, sodium chloride  Antibiotics: Anti-infectives   None       PHYSICAL EXAM: Vital signs in last 24 hours: Filed Vitals:   06/02/13 0000 06/02/13 0400 06/02/13 0500 06/02/13 0732  BP: 120/52 104/52  112/42  Pulse: 70 70  62  Temp: 98.7 F (37.1 C) 98.6 F (37 C)  97.6 F (36.4 C)  TempSrc: Oral Oral  Oral  Resp: 25 23  18   Height:      Weight:   81.2 kg (179 lb 0.2 oz)   SpO2: 98% 97%  93%    Weight change: -3.5 kg (-7 lb 11.5 oz) Filed Weights   05/31/13 1200 06/01/13 0500 06/02/13 0500  Weight: 84.7 kg (186 lb 11.7 oz) 83.9 kg (184 lb 15.5 oz) 81.2 kg (179 lb  0.2 oz)   Body mass index is 34.51 kg/(m^2).   Gen Exam: Awake and alert with clear speech.   Neck: Supple, No JVD.   Chest: B/L Clear-except bibasilar rales CVS: S1 S2 Regular, no murmurs.  Abdomen: soft, BS +, non tender, non distended.  Extremities: 1+ edema, lower extremities warm to touch. Neurologic: Non Focal.  Skin: No Rash.   Wounds: N/A.    Intake/Output from previous day:  Intake/Output Summary  (Last 24 hours) at 06/02/13 0801 Last data filed at 06/02/13 0743  Gross per 24 hour  Intake    635 ml  Output   3675 ml  Net  -3040 ml     LAB RESULTS: CBC  Recent Labs Lab 05/29/13 2056 05/30/13 0630 05/31/13 0340 06/01/13 0740  WBC 8.2 7.3  --  10.3  HGB 12.0 10.6* 10.7* 10.4*  HCT 37.8 34.1* 35.2* 33.8*  PLT 259 248  --  211  MCV 93.1 93.7  --  94.4  MCH 29.6 29.1  --  29.1  MCHC 31.7 31.1  --  30.8  RDW 15.5 15.6*  --  15.3  LYMPHSABS  --   --   --  1.1  MONOABS  --   --   --  0.8  EOSABS  --   --   --  0.1  BASOSABS  --   --   --  0.0    Chemistries   Recent Labs Lab 05/29/13 2056 05/30/13 0630 05/31/13 0340 06/01/13 0740 06/02/13 0500  NA 139 140 142 138 141  K 4.5 4.1 4.6 4.0 3.9  CL 103 102 103 98 95*  CO2 26 31 30  34* 39*  GLUCOSE 155* 109* 110* 131* 114*  BUN 44* 40* 42* 40* 42*  CREATININE 1.36* 1.33* 1.53* 1.36* 1.46*  CALCIUM 8.6 8.6 8.3* 8.4 8.7    CBG:  Recent Labs Lab 05/31/13 2202 06/01/13 0740 06/01/13 1219 06/01/13 1757 06/01/13 2241  GLUCAP 159* 131* 197* 147* 169*    GFR Estimated Creatinine Clearance: 29.3 ml/min (by C-G formula based on Cr of 1.46).  Coagulation profile No results found for this basename: INR, PROTIME,  in the last 168 hours  Cardiac Enzymes  Recent Labs Lab 05/30/13 1424 05/30/13 2111 05/31/13 0340  TROPONINI <0.30 <0.30 <0.30    No components found with this basename: POCBNP,  No results found for this basename: DDIMER,  in the last 72 hours No results found for this basename: HGBA1C,  in the last 72 hours No results found for this basename: CHOL, HDL, LDLCALC, TRIG, CHOLHDL, LDLDIRECT,  in the last 72 hours  Recent Labs  05/30/13 1531  TSH 2.912    Recent Labs  05/31/13 0340  VITAMINB12 479  FOLATE 12.5  FERRITIN 59  TIBC 252  IRON 22*   No results found for this basename: LIPASE, AMYLASE,  in the last 72 hours  Urine Studies No results found for this basename: UACOL,  UAPR, USPG, UPH, UTP, UGL, UKET, UBIL, UHGB, UNIT, UROB, ULEU, UEPI, UWBC, URBC, UBAC, CAST, CRYS, UCOM, BILUA,  in the last 72 hours  MICROBIOLOGY: Recent Results (from the past 240 hour(s))  MRSA PCR SCREENING     Status: None   Collection Time    05/30/13  4:14 PM      Result Value Range Status   MRSA by PCR NEGATIVE  NEGATIVE Final   Comment:            The GeneXpert MRSA Assay (FDA  approved for NASAL specimens     only), is one component of a     comprehensive MRSA colonization     surveillance program. It is not     intended to diagnose MRSA     infection nor to guide or     monitor treatment for     MRSA infections.    RADIOLOGY STUDIES/RESULTS: Dg Chest 2 View  05/29/2013   CLINICAL DATA:  Shortness of breath and cough for 3 weeks.  EXAM: CHEST  2 VIEW  COMPARISON:  PA and lateral chest and right side down decubitus view of the chest 05/22/2013.  FINDINGS: Small to moderate bilateral pleural effusions and basilar airspace disease, both worse on the left, are again seen. Basilar airspace disease and the effusions appear slightly worsened. There is cardiomegaly and pulmonary vascular congestion. No pneumothorax is identified.  IMPRESSION: Some worsening of small to moderate bilateral pleural effusions and basilar airspace disease, worse on the left.  Cardiomegaly and pulmonary vascular congestion.   Electronically Signed   By: Drusilla Kanner M.D.   On: 05/29/2013 21:32   Dg Chest 2 View  05/22/2013   CLINICAL DATA:  Shortness of breath, cough, congestion. Pleural fluid, drain in place.  EXAM: CHEST  2 VIEW  COMPARISON:  05/10/2013  FINDINGS: Left pacer is in place. Cardiomegaly with vascular congestion. Suspect mild interstitial edema. Bibasilar atelectasis. Small left pleural effusion, increased since prior study. Right pleural drain in place without significant right effusion or pneumothorax.  IMPRESSION: Small left pleural effusion. Right pleural drain in place without  visible significant effusion or pneumothorax.  Cardiomegaly, bibasilar atelectasis. Suspect mild pulmonary edema.   Electronically Signed   By: Charlett Nose M.D.   On: 05/22/2013 13:42   Dg Chest 2 View  05/10/2013   CLINICAL DATA:  79 year old female with pleural effusion.  EXAM: CHEST  2 VIEW  COMPARISON:  05/08/2013 and earlier.  FINDINGS: Seated AP and lateral views of the chest. Moderate right and small left pleural effusions have not significantly changed. Ventilation at the right lung base appears mildly decreased. Stable cardiac size and mediastinal contours. No pneumothorax. Pulmonary vascularity appears decreased. No overt edema. Stable visualized osseous structures. Left chest cardiac pacemaker.  IMPRESSION: All moderate right and small left pleural effusions not significantly changed. Mildly decreased ventilation at the right lung base. Interval decreased pulmonary vascular congestion.   Electronically Signed   By: Augusto Gamble M.D.   On: 05/10/2013 19:00   Dg Chest 2 View  05/08/2013   *RADIOLOGY REPORT*  Clinical Data: Recurrent pleural effusion.  Cough.  Short of breath.  CHEST - 2 VIEW  Comparison: 05/08/2013, 0851 hours.  Findings: Dual lead left subclavian pacemaker.  Cardiomegaly.  Low lung volumes.  Small to moderate bilateral pleural effusions and basilar atelectasis. There is pulmonary vascular congestion, interstitial and mild alveolar pulmonary edema.  No pneumothorax.  Compared to the exam earlier today, aeration is slightly improved, likely representing interval diuresis.  IMPRESSION: Moderate CHF.   Original Report Authenticated By: Andreas Newport, M.D.   Dg Chest 2 View  05/08/2013   *RADIOLOGY REPORT*  Clinical Data: Evaluate right-sided pleural effusion after thoracentesis.  CHEST - 2 VIEW  Comparison: Chest x-ray 05/06/2013.  Findings: There is an unusual lucency both medially and laterally in the inferior right hemithorax on the frontal projection, which is suspicious for potential  small pneumothorax, however, there is a right pleural effusion which is small, and this effusion does not appear to layer  horizontally as would typically be seen in the setting of hydropneumothorax.  Small left pleural effusion is also noted.  Lung volumes are low, with bibasilar opacities which may reflect areas of atelectasis and/or consolidation.  Cephalization of the pulmonary vasculature, without frank pulmonary edema.  Heart size appears mildly enlarged.  Mediastinal contours are unremarkable.  Atherosclerosis of the thoracic aorta.  Left-sided pacemaker device with lead tips projecting over the expected location of the right atrium and right ventricular apex.  IMPRESSION: 1.  Unusual appearance of the right hemithorax, favored to reflect a some skin fold artifact.  Alternatively, a small right hydropneumothorax post thoracentesis is difficult to entirely exclude.  Attention on a follow-up study is suggested, preferably a standing PA and lateral chest radiograph to exclude pneumothorax. 2.  Small left pleural effusion. 3.  Cardiomegaly with pulmonary venous congestion, but no frank pulmonary edema. 4.  Bibasilar atelectasis and/or consolidation. 5.  Atherosclerosis.  These results were called by telephone on 05/08/2013 at 09:10 a.m. to nurse Lupita Leash (for Dr. Gala Romney), who verbally acknowledged these results.   Original Report Authenticated By: Trudie Reed, M.D.   Dg Chest 2 View  05/04/2013   *RADIOLOGY REPORT*  Clinical Data: Follow up pleural effusion.  CHEST - 2 VIEW  Comparison: 04/28/2013  Findings: Moderate to large right pleural effusion is now present, reaccumulated from the prior exam.  There is perihilar opacity on the right that is likely atelectasis.  There is a minimal left pleural effusion.  There is no overt pulmonary edema.  The cardiac silhouette is mildly enlarged.  There is no pneumothorax.  The left anterior chest wall sequential pacemaker is stable well- positioned.  IMPRESSION:  Moderate to large right pleural effusion, which has reaccumulated since the prior chest radiograph.  There is associated right perihilar lung base atelectasis.  No overt pulmonary edema. Minimal left pleural effusion.   Original Report Authenticated By: Amie Portland, M.D.   Ct Chest Wo Contrast  05/06/2013   *RADIOLOGY REPORT*  Clinical Data: status post thoracentesis, evaluate for cause of recurrent pleural effusion  CT CHEST WITHOUT CONTRAST  Technique:  Multidetector CT imaging of the chest was performed following the standard protocol without IV contrast.  Comparison: multiple recent chest radiographs  Findings: The study is limited without contrast.  There are small calcified left hilar lymph nodes.  There is coronary arterial and aortic arch calcification.  Cardiac pacer leads are identified with generator over the left thorax.  The heart is enlarged.  There is a small to moderate right pleural effusion.  There is a small left pleural effusion.  There is no pneumothorax.  There is some dense material in consolidated posterior right lower lobe.  This could represent aspirated material.  Bilaterally there is dependent atelectasis.  On the right, there is a focus of irregular hazy airspace disease in the upper lobe on image number 20, measuring 21 x 13 mm.  There is more diffuse ground-glass attenuation throughout the inferior right middle lobe and more severely in the right lower lobe.  On the left, and series 3 image number 26, there is a 1.4 cm nodular density posteriorly in the left lower lobe, but viewing this on axial, coronal, and sagittal images suggests that it is related to subsegmental atelectasis rather than a lung mass.  There is a calcified granuloma measuring 5 mm in the lingula.  There is a very mild ground-glass attenuation in the left upper lobe.  Scans through the uppermost abdomen and unremarkable.  IMPRESSION:  Study is limited without contrast, but there is no specific abnormality to  account for recurrent pleural effusion.  Effusions are bilateral, but larger on the right.  Dense material in consolidated right lower lobe suggests possibility of aspiration.  There is bilateral ground glass attenuation, but it is minimal on the left and much more prominent on the right.  This is nonspecific fidning, but given the asymmetry, it likely represents re-expansion pulmonary edema following thoracentesis.  Finally, there is an approximately 2cm focus of irregular hazy opacity in the right upper lobe, entirely nonspecific. Abnormalities such as atelectasis, focal edema/hemorrhage, or even adenocarcinoma can appear like this, and this should receive followup imaging to reassess.   Original Report Authenticated By: Esperanza Heir, M.D.   Dg Chest Right Decubitus  05/22/2013   CLINICAL DATA:  Pleural fluid, drain.  EXAM: CHEST - RIGHT DECUBITUS  COMPARISON:  Chest x-ray performed today and 05/10/2013  FINDINGS: A right side down decubitus view demonstrates moderate layering right pleural effusion. Right pleural drain in place.  IMPRESSION: Moderate layering right pleural effusion.   Electronically Signed   By: Charlett Nose M.D.   On: 05/22/2013 13:42   Dg Chest Port 1 View  05/30/2013   CLINICAL DATA:  Short of breath  EXAM: PORTABLE CHEST - 1 VIEW  COMPARISON:  05/29/2013  FINDINGS: Cardiac enlargement with vascular congestion and mild edema. Increasing bilateral effusions and bibasilar atelectasis.  IMPRESSION: Congestive heart failure with progression of vascular congestion and bilateral effusions.   Electronically Signed   By: Marlan Palau M.D.   On: 05/30/2013 16:34   Dg Chest Port 1 View  05/06/2013   *RADIOLOGY REPORT*  Clinical Data: Status post right thoracentesis.  PORTABLE CHEST - 1 VIEW  Comparison: 05/04/2013  Findings: There is been significant reduction in the right-sided pleural effusion.  No pneumothorax is identified.  A lucency is noted over the right apex which is related to a  skin fold.    A pacing device is again seen.  No other focal abnormality is noted.  IMPRESSION: No pneumothorax following thoracentesis.  Significant reduction in right effusion is noted.   Original Report Authenticated By: Alcide Clever, M.D.   Ir Perc Pleural Drain W/indwell Cath W/img Guide  05/11/2013   *RADIOLOGY REPORT*  Clinical Data:  Recurrent symptomatic right-sided pleural effusion in patient with history of recurrent CHF, now seeking palliative therapy  INSERTION OF TUNNELED RIGHT SIDED PLEURAL DRAINAGE CATHETER  Comparison:  Ultrasound guided thoracentesis - 04/28/2013; chest radiograph - 05/10/2013; 05/08/2013; chest CT - 05/06/2013  Intravenous medications: Versed 2 mg IV; Fentanyl 75 mcg IV; Ancef 2 gram IV; Antibiotic was administered in an appropriate time interval for the procedure.  Total Moderate Sedation Time: 15 minutes.  Fluoroscopy time:  36 seconds  Complications:  None immediate  Procedure:  The procedure, risks, benefits, and alternatives were explained to the patient and the patient's daughter, who wish to proceed with the placement of this permanent pleural catheter as the patient is seeking palliative care.  The patient and the patient's wife understand and consent to the procedure.  The right lateral chest and right upper abdomen were prepped with Chlorhexidine in a sterile fashion, and a sterile drape was applied covering the operative field.  A sterile gown and sterile gloves were used for the procedure.  Initial ultrasound scanning and fluoroscopic imaging demonstrates a recurrent moderate to large right-sided pleural effusion.  Under direct ultrasound guidance, the right inferior lateral pleural space was accessed with an  19 gauge trocar needle after the overlying soft tissues were anesthetized with 1% lidocaine with epinephrine. An Amplatz superstiff wire was then advanced under fluoroscopy into the pleural space.  A 16 French tunneled pleural catheter was tunneled from an  incision within the right upper abdominal quadrant to the access site. The pleural access site was serially dilated under fluoroscopy, ultimately allowing placement of a peel-away sheath.  The catheter was advanced through the peel-away sheath.  The sheath was then removed.  Final catheter positioning was confirmed with a fluoroscopic radiographic image.  The access incision was closed with subcutaneous subcuticular 4-0 Vicryl, Dermabond and Steri-Strips.  A Prolene retention suture was applied at the catheter exit site.  Large volume thoracentesis was performed through the new catheter utilizing provided bulb vacuum assisted drainage bag.  The patient tolerated the above procedure well without immediate postprocedural complication.  Findings:  The catheter was placed via the right lateral chest wall.  Catheter course is towards the apex.  Approximately 1.3 liters of serous pleural fluid was able to be removed after catheter placement.  IMPRESSION:  Successful placement of permanent, tunneled right pleural drainage catheter via lateral approach.  Approximately 1.3 liters of serous pleural fluid was removed after catheter placement.   Original Report Authenticated By: Tacey Ruiz, MD    Jeoffrey Massed, MD  [facility] Pager:[phone]  If 7PM-7AM, please contact night-coverage [email or web address] Password [email or web address] 06/02/2013, 8:01 AM   LOS: 4 days

## 2013-06-02 NOTE — Evaluation (Signed)
Physical Therapy Evaluation Patient Details Name: Miranda Cruz MRN: [id number] DOB: [birth year] Today's Date: 06/02/2013 Time: 4098-1191 PT Time Calculation (min): 27 min  PT Assessment / Plan / Recommendation History of Present Illness  Patient is a 79 year old female with history of chronic systolic heart failure, recurrent pleural effusion with right pleurex Drain in Place, with numerous recent admission for decompensated heart failure, presented to the hospital with shortness of breath. Her symptoms were consistent with decompensated heart failure, during the hospital course she developed respiratory failure with hypoxia and was transferred to the step down unit and was placed on BiPAP. With further aggressive diureses, she clinically improved and has been off the BiPAP for the past 2 days. She is now being transferred to a telemetry unit, both the heart failure team and cardiology has been consulted.  Clinical Impression  Pt admitted with the above. Pt currently with functional limitations due to the deficits listed below (see PT Problem List). Pt very pleasant and very willing to work with therapy.  Pt will benefit from skilled PT to increase their independence and safety with mobility to allow discharge to the venue listed below.      PT Assessment  Patient needs continued PT services    Follow Up Recommendations  Home health PT;Supervision - Intermittent    Barriers to Discharge Decreased caregiver support      Equipment Recommendations  None recommended by PT    Frequency Min 3X/week    Precautions / Restrictions Precautions Precautions: Fall Restrictions Weight Bearing Restrictions: No   Pertinent Vitals/Pain No c/o pain      Mobility  Bed Mobility Bed Mobility: Sit to Supine Supine to Sit: 4: Min assist;With rails Details for Bed Mobility Assistance: (A) to elevate trunk OOB with cues for technique and heavy use of rail  Transfers Transfers: Sit to Stand;Stand to  Sit Sit to Stand: 4: Min assist;From bed Stand to Sit: 4: Min assist;To chair/3-in-1 Details for Transfer Assistance: (A) to initiate transfer with cues for hand placement Ambulation/Gait Ambulation/Gait Assistance: 4: Min guard Ambulation Distance (Feet): 60 Feet Assistive device: Rolling walker Ambulation/Gait Assistance Details: Minguard for safety with cues for upright posture and RW placement.  Gait Pattern: Step-through pattern;Decreased stride length Gait velocity: slow Stairs: No    Exercises General Exercises - Lower Extremity Hip Flexion/Marching: Strengthening;10 reps;Seated   PT Diagnosis: Difficulty walking;Generalized weakness  PT Problem List: Decreased strength;Decreased activity tolerance;Decreased balance;Decreased mobility PT Treatment Interventions: DME instruction;Gait training;Functional mobility training;Therapeutic activities;Therapeutic exercise     PT Goals(Current goals can be found in the care plan section) Acute Rehab PT Goals Patient Stated Goal: return home PT Goal Formulation: With patient Time For Goal Achievement: 06/09/13 Potential to Achieve Goals: Good  Visit Information  Last PT Received On: 06/02/13 Assistance Needed: +1 History of Present Illness: Patient is a 79 year old female with history of chronic systolic heart failure, recurrent pleural effusion with right pleurex Drain in Place, with numerous recent admission for decompensated heart failure, presented to the hospital with shortness of breath. Her symptoms were consistent with decompensated heart failure, during the hospital course she developed respiratory failure with hypoxia and was transferred to the step down unit and was placed on BiPAP. With further aggressive diureses, she clinically improved and has been off the BiPAP for the past 2 days. She is now being transferred to a telemetry unit, both the heart failure team and cardiology has been consulted.       Prior Functioning   Home Living  Family/patient expects to be discharged to:: Private residence Living Arrangements: Children Available Help at Discharge: Family;Available PRN/intermittently Type of Home: House Home Access: Ramped entrance Home Layout: One level Home Equipment: Walker - 2 wheels;Bedside commode;Grab bars - tub/shower;Wheelchair - manual Prior Function Level of Independence: Independent with assistive device(s) Comments: pt was amb without RW Communication Communication: No difficulties Dominant Hand: Right    Cognition  Cognition Arousal/Alertness: Awake/alert Behavior During Therapy: WFL for tasks assessed/performed Overall Cognitive Status: Within Functional Limits for tasks assessed    Extremity/Trunk Assessment Lower Extremity Assessment Lower Extremity Assessment: Generalized weakness Cervical / Trunk Assessment Cervical / Trunk Assessment: Normal   Balance Balance Balance Assessed: Yes Static Sitting Balance Static Sitting - Balance Support: Feet supported Static Sitting - Level of Assistance: 4: Min assist;5: Stand by assistance Static Sitting - Comment/# of Minutes: Intermittent assistance need due to posterior lean cues for upright posture and prevent posterior lean Static Standing Balance Static Standing - Balance Support: No upper extremity supported Static Standing - Level of Assistance: 4: Min assist;5: Stand by assistance  End of Session PT - End of Session Equipment Utilized During Treatment: Gait belt;Oxygen (5L) Activity Tolerance: Patient tolerated treatment well Patient left: in bed;with call bell/phone within reach Nurse Communication: Mobility status;Other (comment)  GP     Kismet Facemire 06/02/2013, 2:36 PM  Jake Shark, PT DPT [phone]

## 2013-06-02 NOTE — Progress Notes (Signed)
Advanced Heart Failure Rounding Note   Subjective:    Miranda Cruz is an 79 year old with a history of chronic systolic heart failure EF 35%, ICM, CKD baseline creatinine 1.8, A fib on eliquis, DM, hypothyroid, and recurrent pleural effusion with pleurex catheter.   CXR- bilateral pleural effusions L>R. Bibasilar atelectasis or consolidation. No drainage from Pleurex  Has decided on limited Code (No intubation or CPR)  Yesterday  lasix drip was increased to 15 mg per hour. Weight down 5 pounds. 24 hour I/O - 2.6 liters.  Now 179 pounds. (Baseline weight ~ 175). Feeling better. No dyspnea or orthopnea.  Creatinine 1.3>1.46     Objective:   Weight Range:  Vital Signs:   Temp:  [98.3 F (36.8 C)-98.7 F (37.1 C)] 98.6 F (37 C) (09/26 0400) Pulse Rate:  [69-70] 70 (09/26 0400) Resp:  [13-25] 23 (09/26 0400) BP: (104-134)/(35-69) 104/52 mmHg (09/26 0400) SpO2:  [93 %-99 %] 97 % (09/26 0400) Weight:  [179 lb 0.2 oz (81.2 kg)] 179 lb 0.2 oz (81.2 kg) (09/26 0500)    Weight change: Filed Weights   05/31/13 1200 06/01/13 0500 06/02/13 0500  Weight: 186 lb 11.7 oz (84.7 kg) 184 lb 15.5 oz (83.9 kg) 179 lb 0.2 oz (81.2 kg)    Intake/Output:   Intake/Output Summary (Last 24 hours) at 06/02/13 0716 Last data filed at 06/02/13 0700  Gross per 24 hour  Intake    890 ml  Output   3500 ml  Net  -2610 ml     Physical Exam: General:  Elderly sitting in chair No resp difficulty HEENT: normal Neck: supple. JVP 9. Carotids 2+ bilat; no bruits. No lymphadenopathy or thryomegaly appreciated. Cor: PMI nondisplaced. Regular rate & rhythm. No rubs, gallops or murmurs. Lungs: Rhonchi thoughout. R pleurex catheter Abdomen: soft, nontender, nondistended. No hepatosplenomegaly. No bruits or masses. Good bowel sounds. Extremities: no cyanosis, clubbing, rash, RLE and LLE tr edema. Bilateral lower extremity ted hose. Neuro: alert & orientedx3, cranial nerves grossly intact. moves all 4  extremities w/o difficulty. Affect pleasant  Telemetry: A fib 90s Labs: Basic Metabolic Panel:  Recent Labs Lab 05/29/13 2056 05/30/13 0630 05/31/13 0340 06/01/13 0740 06/02/13 0500  NA 139 140 142 138 141  K 4.5 4.1 4.6 4.0 3.9  CL 103 102 103 98 95*  CO2 26 31 30  34* 39*  GLUCOSE 155* 109* 110* 131* 114*  BUN 44* 40* 42* 40* 42*  CREATININE 1.36* 1.33* 1.53* 1.36* 1.46*  CALCIUM 8.6 8.6 8.3* 8.4 8.7    Liver Function Tests: No results found for this basename: AST, ALT, ALKPHOS, BILITOT, PROT, ALBUMIN,  in the last 168 hours No results found for this basename: LIPASE, AMYLASE,  in the last 168 hours No results found for this basename: AMMONIA,  in the last 168 hours  CBC:  Recent Labs Lab 05/29/13 2056 05/30/13 0630 05/31/13 0340 06/01/13 0740  WBC 8.2 7.3  --  10.3  NEUTROABS  --   --   --  8.4*  HGB 12.0 10.6* 10.7* 10.4*  HCT 37.8 34.1* 35.2* 33.8*  MCV 93.1 93.7  --  94.4  PLT 259 248  --  211    Cardiac Enzymes:  Recent Labs Lab 05/30/13 1424 05/30/13 2111 05/31/13 0340  TROPONINI <0.30 <0.30 <0.30    BNP: BNP (last 3 results)  Recent Labs  05/22/13 1512 05/29/13 2056 05/30/13 1424  PROBNP 11291.0* 9064.0* 8898.0*     Other results:   Imaging: Dg  Chest Port 1 View  06/01/2013   CLINICAL DATA:  Cough and congestion  EXAM: PORTABLE CHEST - 1 VIEW  COMPARISON:  05/30/2013.  FINDINGS: Unchanged positioning of dual-chamber left approach pacer. Right-sided pleural drain in stable position.  No change in cardiomegaly. Haziness of the bilateral chest consistent with effusions, unchanged. Air bronchograms noted at the right base. No pneumothorax or edema.  IMPRESSION: 1. Stable volume bilateral pleural effusions. Right-sided pleural drain in stable position. 2. Bibasilar atelectasis or consolidation.   Electronically Signed   By: Tiburcio Pea   On: 06/01/2013 05:54     Medications:     Scheduled Medications: . allopurinol  100 mg Oral q  morning - 10a  . ALPRAZolam  1 mg Oral QHS  . aspirin EC  81 mg Oral QHS  . atorvastatin  40 mg Oral QHS  . guaiFENesin  600 mg Oral BID  . heparin subcutaneous  5,000 Units Subcutaneous Q8H  . insulin aspart  0-9 Units Subcutaneous TID WC  . levothyroxine  125 mcg Oral QAC breakfast  . metolazone  2.5 mg Oral BID  . metoprolol succinate  12.5 mg Oral Daily  . pantoprazole  40 mg Oral Daily  . polyethylene glycol  17 g Oral Daily  . sevelamer carbonate  800 mg Oral BID WC  . sodium chloride  3 mL Intravenous Q12H    Infusions: . furosemide (LASIX) infusion 15 mg/hr (06/02/13 0402)    PRN Medications: sodium chloride, acetaminophen, feeding supplement, fluticasone, HYDROmorphone (DILAUDID) injection, levalbuterol, nitroGLYCERIN, ondansetron (ZOFRAN) IV, ondansetron, oxyCODONE, sodium chloride   Assessment:  1.A/C respiratory failure  2. A/C chronic systolic heart failure EF 35%  3. R pleural effusion with pluerex catheter  4. CKD creatinine baseline 1.5  5. Hypothyroid  6. DM  7. Cardiorenal syndrome with CHF  8. AF, chronic  9. Limited Code - No intubation or CPR. 1 shock and inotropes ok.     Plan/Discussion:    Volume status improving .  Closing in on baseline weight. Continue lasix drip at 15 mg per hour and metolazone 2.5 mg bid. Check BMET this afternoon if Cr rising will stop IV lasix. Continue metoprolol succinate 12.5 mg daily. No ace due to CKD. Creatinine up slightly 1.3>1.4 .  PT consult pending.   Length of Stay: 4 CLEGG,AMY 06/02/2013, 7:16 AM Advanced Heart Failure Team Pager [phone] (M-F; 7a - 4p)  Please contact [facility] for night-coverage after hours (4p -7a ) and weekends on [email or web address]   Patient seen and examined with Tonye Becket, NP. We discussed all aspects of the encounter. I agree with the assessment and plan as stated above. Volume status improving. Continue diuresis. Check BMET later today - may need to pull back on diuresis. Perhaps  home on Sunday or Monday. Would like to see her on stable oral diuretic regimen prior to d/c.  Daniel Bensimhon,MD 8:29 AM

## 2013-06-02 NOTE — Progress Notes (Signed)
Notes reviewed  Appreciate team care  Feels better  Watch creat.

## 2013-06-02 NOTE — Progress Notes (Addendum)
MD page, requesting suppository, family worried about Pt having to be disimpacted, like doing last HP stay. Suppository order and giving. Pt does not feel impacted at this time

## 2013-06-02 NOTE — Progress Notes (Signed)
Rec Pt for stepdown RN. Pt o3x , no complaints of pain or sob. Will continue to monitor

## 2013-06-03 LAB — GLUCOSE, CAPILLARY
Glucose-Capillary: 121 mg/dL — ABNORMAL HIGH (ref 70–99)
Glucose-Capillary: 166 mg/dL — ABNORMAL HIGH (ref 70–99)
Glucose-Capillary: 111 mg/dL — ABNORMAL HIGH (ref 70–99)
Glucose-Capillary: 168 mg/dL — ABNORMAL HIGH (ref 70–99)

## 2013-06-03 LAB — BASIC METABOLIC PANEL
CO2: 42 mEq/L (ref 19–32)
Calcium: 8.8 mg/dL (ref 8.4–10.5)
Creatinine, Ser: 1.43 mg/dL — ABNORMAL HIGH (ref 0.50–1.10)
Glucose, Bld: 114 mg/dL — ABNORMAL HIGH (ref 70–99)

## 2013-06-03 NOTE — Progress Notes (Signed)
Pt resting on bed comfortable on 4L [place] saturation 94%, c/o lower back pain, OXi IR given as needed. Pt continues with lasix gtt as ordered,  R side pleurex catheter in place capped as MD order. We'll continue with POC.

## 2013-06-03 NOTE — Progress Notes (Signed)
Daughter in [place],  Is constantly calling Mrs. Reinwald over the phone, she insist that the pt is confuse and is acting wear. Pt refers she is now feeling very anxious. Family member contact the charge nurse with her concern. Enrique Sack the CN double assess pt on her room to verify family concern. Pt complete AO x 4 no distress noticed. Xanax 1mg  PO given as ordered, pain medication given and foley d/c as ordered. Pt oriented to don't get OOB without assistance to prevent any fall or injury while in the hospital, bed alarm in place to prevent any fall. We'll continue with frequently rounding to meet pt's needs and with POC.

## 2013-06-03 NOTE — Progress Notes (Signed)
Patient ID: Miranda Cruz, female   DOB: [birth year], 79 y.o.   MRN: [id number] [facility] PROGRESS NOTE  Miranda Cruz WUJ:[id number] DOB: [birth year] DOA: 05/29/2013 PCP: Hoyle Sauer, MD  Assessment/Plan:  1. Acute respiratory failure with hypoxia improved. Now on [place] and comfortable. Secondary to Acute on chronic systolic heart failure  2. Acute on chronic systolic CHF (congestive heart failure) -volume status much better-weight decreasing, cardiology plans to start oral diuresis tomorrow.  She will need to be stable on oral regimen prior to dc. both CHF team/Cards following. not on ACEI given CKD. EF 35% 3. Pleural Effusion secondary to CHF. Plurex capped, no current significant effusion. 4. Hypothyroidism -c/w Levothyroxine  5. DM -CBG's stable, continue ssi 6. Anemia- likley due to CKD, continue to monitor 7. CKD (chronic kidney disease) stage 3- 4, GFR 15-29 ml/min : creatinine has been stable, continue to monitor creatinine while on lasix  8. Disposition: now on telemetry, family and patient eager to get her home.  They seem to understand that she has multiple chronic conditions which will not improve and they are comfortable with this.  9. DVT Prophylaxis: Prophylactic Heparin   Code Status: DNI, limited Family Communication: patient, daughters Disposition Plan: would like to go home ASAP, likeky early this week.   Consultants:  Cardiology, heart failure, pulmonology CCM  Procedures:  none  Antibiotics:  none  HPI/Subjective: Patient is a 79 year old female with history of chronic systolic heart failure, recurrent pleural effusion with right pleurex Drain in Place, with numerous recent admission for decompensated heart failure, presented to the hospital with shortness of breath. Her symptoms were consistent with decompensated heart failure, during the hospital course she developed respiratory failure with hypoxia and was transferred to the step down unit and was placed on  BiPAP. With further aggressive diureses, she clinically improved and has been off the BiPAP for 2 days. She is now comfortable on [place] on telemetry unit, both the heart failure team and cardiology have been following.    Objective: Filed Vitals:   06/03/13 0617  BP: 124/49  Pulse: 69  Temp: 98.1 F (36.7 C)  Resp: 22    Intake/Output Summary (Last 24 hours) at 06/03/13 1256 Last data filed at 06/03/13 1009  Gross per 24 hour  Intake 980.33 ml  Output   3602 ml  Net -2621.67 ml   Filed Weights   06/02/13 0500 06/02/13 1736 06/03/13 0617  Weight: 81.2 kg (179 lb 0.2 oz) 80.4 kg (177 lb 4 oz) 78.472 kg (173 lb)    Exam:   General:  Hard of hearing. Alert, oriented.  Cardiovascular: distant, RRR, not able to appriciate MRG  Respiratory: coarse breath sounds with rhonchi and wheezes throughout.  Abdomen: soft, non tender, non distended, bs normal  Extremities:  No edema, pulses +1  Data Reviewed: Basic Metabolic Panel:  Recent Labs Lab 05/31/13 0340 06/01/13 0740 06/02/13 0500 06/02/13 1220 06/03/13 0605  NA 142 138 141 138 137  K 4.6 4.0 3.9 3.5 3.3*  CL 103 98 95* 91* 87*  CO2 30 34* 39* 39* 42*  GLUCOSE 110* 131* 114* 141* 114*  BUN 42* 40* 42* 44* 47*  CREATININE 1.53* 1.36* 1.46* 1.47* 1.43*  CALCIUM 8.3* 8.4 8.7 8.7 8.8   Liver Function Tests: No results found for this basename: AST, ALT, ALKPHOS, BILITOT, PROT, ALBUMIN,  in the last 168 hours No results found for this basename: LIPASE, AMYLASE,  in the last 168 hours No results found for this  basename: AMMONIA,  in the last 168 hours CBC:  Recent Labs Lab 05/29/13 2056 05/30/13 0630 05/31/13 0340 06/01/13 0740  WBC 8.2 7.3  --  10.3  NEUTROABS  --   --   --  8.4*  HGB 12.0 10.6* 10.7* 10.4*  HCT 37.8 34.1* 35.2* 33.8*  MCV 93.1 93.7  --  94.4  PLT 259 248  --  211   Cardiac Enzymes:  Recent Labs Lab 05/30/13 1424 05/30/13 2111 05/31/13 0340  TROPONINI <0.30 <0.30 <0.30   BNP (last 3  results)  Recent Labs  05/22/13 1512 05/29/13 2056 05/30/13 1424  PROBNP 11291.0* 9064.0* 8898.0*   CBG:  Recent Labs Lab 06/02/13 1146 06/02/13 1604 06/02/13 2118 06/03/13 0536 06/03/13 1128  GLUCAP 147* 207* 127* 121* 166*    Recent Results (from the past 240 hour(s))  MRSA PCR SCREENING     Status: None   Collection Time    05/30/13  4:14 PM      Result Value Range Status   MRSA by PCR NEGATIVE  NEGATIVE Final   Comment:            The GeneXpert MRSA Assay (FDA     approved for NASAL specimens     only), is one component of a     comprehensive MRSA colonization     surveillance program. It is not     intended to diagnose MRSA     infection nor to guide or     monitor treatment for     MRSA infections.     Studies: No results found.  Scheduled Meds: . allopurinol  100 mg Oral q morning - 10a  . ALPRAZolam  1 mg Oral QHS  . aspirin EC  81 mg Oral QHS  . atorvastatin  40 mg Oral QHS  . guaiFENesin  600 mg Oral BID  . heparin subcutaneous  5,000 Units Subcutaneous Q8H  . insulin aspart  0-9 Units Subcutaneous TID WC  . levothyroxine  125 mcg Oral QAC breakfast  . metolazone  2.5 mg Oral BID  . metoprolol succinate  12.5 mg Oral Daily  . pantoprazole  40 mg Oral Daily  . polyethylene glycol  17 g Oral Daily  . sevelamer carbonate  800 mg Oral BID WC  . sodium chloride  3 mL Intravenous Q12H   Continuous Infusions: . furosemide (LASIX) infusion 15 mg/hr (06/02/13 2128)    Principal Problem:   Acute respiratory failure with hypoxia Active Problems:   CKD (chronic kidney disease) stage 4, GFR 15-29 ml/min   DM (diabetes mellitus)   Pleural effusion - right   Acute on chronic systolic CHF (congestive heart failure)   Cardiorenal syndrome with renal failure    Time spent: 35 minutes    [facility]  [facility] Pager [phone]. If 7PM-7AM, please contact night-coverage at [email or web address], password [facility] 06/03/2013, 12:56 PM  LOS: 5 days

## 2013-06-03 NOTE — Progress Notes (Signed)
Subjective:  Says that her head is congested and she is not hearing well today. No chest pain, breathing is somewhat better today.  Objective:  Vital Signs in the last 24 hours: BP 124/49  Pulse 69  Temp(Src) 98.1 F (36.7 C) (Oral)  Resp 22  Ht 5' 0.4" (1.534 m)  Wt 78.472 kg (173 lb)  BMI 33.35 kg/m2  SpO2 95%  Physical Exam: Elderly white female in no acute distress, moderately obese, several missing teeth, hirsutism noted Lungs: Rhonchi with some mild rales at the bases, Pleurx catheter noted  Cardiac:  Regular rhythm, normal S1 and S2, no S3,  Abdomen:  Soft, nontender, no masses Extremities:  Trace edema present  Intake/Output from previous day: 09/26 0701 - 09/27 0700 In: 1180.3 [P.O.:368; I.V.:212.3] Out: 3726 [Urine:3725; Stool:1] Weight Filed Weights   06/02/13 0500 06/02/13 1736 06/03/13 0617  Weight: 81.2 kg (179 lb 0.2 oz) 80.4 kg (177 lb 4 oz) 78.472 kg (173 lb)    Lab Results: Basic Metabolic Panel:  Recent Labs  16/10/96 1220 06/03/13 0605  NA 138 137  K 3.5 3.3*  CL 91* 87*  CO2 39* 42*  GLUCOSE 141* 114*  BUN 44* 47*  CREATININE 1.47* 1.43*    CBC:  Recent Labs  06/01/13 0740  WBC 10.3  NEUTROABS 8.4*  HGB 10.4*  HCT 33.8*  MCV 94.4  PLT 211    BNP    Component Value Date/Time   PROBNP 8898.0* 05/30/2013 1424   Telemetry: Underlying atrial fibrillation with ventricular pacing  Assessment/Plan: 1. Acute on chronic systolic heart failure with ejection fraction 35% 2. Right pleural effusion with Pleurx catheter 3. Chronic kidney disease 4. Chronic respiratory failure  Recommendations:  Her weight is down 10 pounds. Her creatinine is not rising today and will continue intravenous diuresis today and change to oral diuretics tomorrow.      Darden Palmer  MD [facility]  06/03/2013, 11:10 AM

## 2013-06-03 NOTE — Progress Notes (Signed)
Pt's family called with concerns regarding pt's mental status and possibly having a delirium episode,. Also concerned about pt's foley cath and how it could be contributing to pt's anxiety. Pt is A&Ox4 resting comfortably in bed. VSS. Pt states that she feels a little anxious. Pt  sched Xanax was given by assigned RN and foley cath was also dc'd. Will cont to monitor. Benay Pike, RN

## 2013-06-03 NOTE — Progress Notes (Signed)
Daughter in law call with concern about pt's mental status. Neuro assessment completed on the pt, pt is complete AO x4, her VSS are stable, O2 saturation 97% on 2L [place]. Pt complain on lower back pain 7/10, pain medication will be given. CBG test done 168 at this time. Pt refers that she is feeling that she was " flying on pieces". Pt was deep sleep just before daughter in law call her. Pt continues on Lasix gtt as ordered, no distress noticed. All assessment completed while daughter in law over the phone per her request. We'll continue with POC.

## 2013-06-04 ENCOUNTER — Inpatient Hospital Stay (HOSPITAL_COMMUNITY): Payer: Medicare Other

## 2013-06-04 LAB — URINALYSIS, ROUTINE W REFLEX MICROSCOPIC
Bilirubin Urine: NEGATIVE
Hgb urine dipstick: NEGATIVE
Ketones, ur: NEGATIVE mg/dL
Nitrite: NEGATIVE
Protein, ur: NEGATIVE mg/dL
Specific Gravity, Urine: 1.006 (ref 1.005–1.030)
Urobilinogen, UA: 0.2 mg/dL (ref 0.0–1.0)
pH: 5 (ref 5.0–8.0)

## 2013-06-04 LAB — BASIC METABOLIC PANEL
Calcium: 8.9 mg/dL (ref 8.4–10.5)
Chloride: 85 mEq/L — ABNORMAL LOW (ref 96–112)
Creatinine, Ser: 1.65 mg/dL — ABNORMAL HIGH (ref 0.50–1.10)
GFR calc Af Amer: 33 mL/min — ABNORMAL LOW (ref >90)
GFR calc non Af Amer: 28 mL/min — ABNORMAL LOW (ref >90)
Glucose, Bld: 127 mg/dL — ABNORMAL HIGH (ref 70–99)
Potassium: 3.2 mEq/L — ABNORMAL LOW (ref 3.5–5.1)

## 2013-06-04 LAB — CBC
HCT: 36.8 % (ref 36.0–46.0)
MCH: 29.5 pg (ref 26.0–34.0)
MCV: 95.3 fL (ref 78.0–100.0)
Platelets: 214 10*3/uL (ref 150–400)
RDW: 15.4 % (ref 11.5–15.5)
WBC: 11.2 10*3/uL — ABNORMAL HIGH (ref 4.0–10.5)

## 2013-06-04 LAB — GLUCOSE, CAPILLARY
Glucose-Capillary: 135 mg/dL — ABNORMAL HIGH (ref 70–99)
Glucose-Capillary: 202 mg/dL — ABNORMAL HIGH (ref 70–99)
Glucose-Capillary: 170 mg/dL — ABNORMAL HIGH (ref 70–99)

## 2013-06-04 MED ORDER — POTASSIUM CHLORIDE CRYS ER 20 MEQ PO TBCR
40.0000 meq | EXTENDED_RELEASE_TABLET | Freq: Two times a day (BID) | ORAL | Status: AC
  Administered 2013-06-04 (×2): 40 meq via ORAL
  Filled 2013-06-04 (×2): qty 2

## 2013-06-04 MED ORDER — LORATADINE 10 MG PO TABS
10.0000 mg | ORAL_TABLET | Freq: Every day | ORAL | Status: DC
  Administered 2013-06-04 – 2013-06-08 (×5): 10 mg via ORAL
  Filled 2013-06-04 (×5): qty 1

## 2013-06-04 MED ORDER — FUROSEMIDE 40 MG PO TABS
40.0000 mg | ORAL_TABLET | Freq: Two times a day (BID) | ORAL | Status: DC
  Administered 2013-06-04 – 2013-06-05 (×2): 40 mg via ORAL
  Filled 2013-06-04 (×4): qty 1

## 2013-06-04 NOTE — Progress Notes (Signed)
Subjective:  Some mild confusion overnight but appears oriented to date. Very hard of hearing. Not short of breath. Her weight is down to 170 pounds. Wants to know what is to be done about the Pleurx catheter.  Objective:  Vital Signs in the last 24 hours: BP 115/48  Pulse 73  Temp(Src) 97.3 F (36.3 C) (Oral)  Resp 22  Ht 5' 0.4" (1.534 m)  Wt 77.384 kg (170 lb 9.6 oz)  BMI 32.89 kg/m2  SpO2 94%  Physical Exam: Elderly white female in no acute distress, moderately obese, several missing teeth, hirsutism noted Lungs: Rhonchi with some mild rales at the bases, Pleurx catheter noted  Cardiac:  Regular rhythm, normal S1 and S2, no S3,  Abdomen:  Soft, nontender, no masses Extremities:  Trace edema present  Intake/Output from previous day: 09/27 0701 - 09/28 0700 In: 740 [P.O.:740] Out: 1902 [Urine:1901; Stool:1] Weight Filed Weights   06/02/13 1736 06/03/13 0617 06/04/13 0511  Weight: 80.4 kg (177 lb 4 oz) 78.472 kg (173 lb) 77.384 kg (170 lb 9.6 oz)    Lab Results: Basic Metabolic Panel:  Recent Labs  16/10/96 0605 06/04/13 0550  NA 137 138  K 3.3* 3.2*  CL 87* 85*  CO2 42* >45*  GLUCOSE 114* 127*  BUN 47* 54*  CREATININE 1.43* 1.65*    CBC:  Recent Labs  06/04/13 0550  WBC 11.2*  HGB 11.4*  HCT 36.8  MCV 95.3  PLT 214    BNP    Component Value Date/Time   PROBNP 8898.0* 05/30/2013 1424   Telemetry: Underlying atrial fibrillation with ventricular pacing  Assessment/Plan: 1. Acute on chronic systolic heart failure with ejection fraction 35% 2. Right pleural effusion with Pleurx catheter 3. Chronic kidney disease, BUN is up today. 4. Chronic respiratory failure  Recommendations:  Discontinue intravenous Lasix and changed to furosemide 40 mg twice daily treat stop the metolazone. Watch renal function.      Darden Palmer  MD [facility]  06/04/2013, 10:23 AM

## 2013-06-04 NOTE — Progress Notes (Signed)
Patient ID: Miranda Cruz, female   DOB: 1930-05-17, 79 y.o.   MRN: [id number] [facility] PROGRESS NOTE  CHANELLE HODSDON WUJ:[id number] DOB: 03-28-1930 DOA: 05/29/2013 PCP: Hoyle Sauer, MD  Assessment and Plan   1. Confusion: this morning is less alert than usual. Will check UA and CXR. 2. Hypokalemia: replete 3. Hypercarbia: will need to monitor carefully given recent resp failure. Check CXR. 4. Acute respiratory failure with hypoxia improved. Now on [place] and comfortable. Secondary to Acute on chronic systolic heart failure  5. Acute on chronic systolic CHF (congestive heart failure) -volume status much better-weight decreasing, cardiology has started oral diuresis today. She will need to be stable on oral regimen prior to dc. both CHF team/Cards following. not on ACEI given CKD. EF 35% 6. Pleural Effusion secondary to CHF. Plurex capped, no current significant effusion. Plurex will remain in place for now. 7. Hypothyroidism -c/w Levothyroxine  8. DM -CBG's stable, continue ssi 9. Anemia- likley due to CKD, continue to monitor 10. CKD (chronic kidney disease) stage 3- 4, GFR 15-29 ml/min : creatinine has been stable, continue to monitor creatinine while on lasix  11. Disposition: this is a difficult situation.  She has very fragile heart failure with multiple admissions this year.  The family would like to keep her at home and is reluctant to involve hospice or consider a comfort care path.  They do want to keep her out of the hospital, and would like to try to be aggressive with her sodium, fluids and meds at home.  There are many siblings available to assist with care.  They are open to home health assistance.  They would like to follow up with the heart failure clinic. 12. DVT Prophylaxis: Prophylactic Heparin   Code Status: DNI, limited  Family Communication: patient, daughters Clerance Lav (primary care giver) Disposition Plan: will remain inpatient.  Consultants:  Cardiology, heart  failure, pulmonology CCM Procedures:  none Antibiotics:  None  HPI/Subjective:  Patient is a 79 year old female with history of chronic systolic heart failure, recurrent pleural effusion with right pleurex Drain in Place, with numerous recent admission for decompensated heart failure, presented to the hospital with shortness of breath. Her symptoms were consistent with decompensated heart failure, during the hospital course she developed respiratory failure with hypoxia and was transferred to the step down unit and was placed on BiPAP. With further aggressive diureses, she clinically improved and has been off the BiPAP for 3 days. She is now comfortable on [place] on telemetry unit, both the heart failure team and cardiology have been following.   Today she is slightly lethargic and confused which is a change from yesterday.    Objective: Filed Vitals:   06/04/13 0908  BP: 115/48  Pulse: 73  Temp:   Resp:     Intake/Output Summary (Last 24 hours) at 06/04/13 1202 Last data filed at 06/04/13 0936  Gross per 24 hour  Intake    740 ml  Output   1504 ml  Net   -764 ml   Filed Weights   06/02/13 1736 06/03/13 0617 06/04/13 0511  Weight: 80.4 kg (177 lb 4 oz) 78.472 kg (173 lb) 77.384 kg (170 lb 9.6 oz)    Exam: General: Hard of hearing. Groggy, confused Cardiovascular: distant, RRR, not able to appriciate MRG  Respiratory: coarse breath sounds with rhonchi and wheezes throughout.  Abdomen: soft, non tender, non distended, bs normal  Extremities: No edema, pulses +1  Data Reviewed: Basic Metabolic Panel:  Recent Labs  Lab 06/01/13 0740 06/02/13 0500 06/02/13 1220 06/03/13 0605 06/04/13 0550  NA 138 141 138 137 138  K 4.0 3.9 3.5 3.3* 3.2*  CL 98 95* 91* 87* 85*  CO2 34* 39* 39* 42* >45*  GLUCOSE 131* 114* 141* 114* 127*  BUN 40* 42* 44* 47* 54*  CREATININE 1.36* 1.46* 1.47* 1.43* 1.65*  CALCIUM 8.4 8.7 8.7 8.8 8.9   Liver Function Tests: No results found for this  basename: AST, ALT, ALKPHOS, BILITOT, PROT, ALBUMIN,  in the last 168 hours No results found for this basename: LIPASE, AMYLASE,  in the last 168 hours No results found for this basename: AMMONIA,  in the last 168 hours CBC:  Recent Labs Lab 05/29/13 2056 05/30/13 0630 05/31/13 0340 06/01/13 0740 06/04/13 0550  WBC 8.2 7.3  --  10.3 11.2*  NEUTROABS  --   --   --  8.4*  --   HGB 12.0 10.6* 10.7* 10.4* 11.4*  HCT 37.8 34.1* 35.2* 33.8* 36.8  MCV 93.1 93.7  --  94.4 95.3  PLT 259 248  --  211 214   Cardiac Enzymes:  Recent Labs Lab 05/30/13 1424 05/30/13 2111 05/31/13 0340  TROPONINI <0.30 <0.30 <0.30   BNP (last 3 results)  Recent Labs  05/22/13 1512 05/29/13 2056 05/30/13 1424  PROBNP 11291.0* 9064.0* 8898.0*   CBG:  Recent Labs Lab 06/03/13 1128 06/03/13 1615 06/03/13 2040 06/04/13 0550 06/04/13 1050  GLUCAP 166* 111* 168* 135* 202*    Recent Results (from the past 240 hour(s))  MRSA PCR SCREENING     Status: None   Collection Time    05/30/13  4:14 PM      Result Value Range Status   MRSA by PCR NEGATIVE  NEGATIVE Final   Comment:            The GeneXpert MRSA Assay (FDA     approved for NASAL specimens     only), is one component of a     comprehensive MRSA colonization     surveillance program. It is not     intended to diagnose MRSA     infection nor to guide or     monitor treatment for     MRSA infections.     Studies: No results found.  Scheduled Meds: . allopurinol  100 mg Oral q morning - 10a  . ALPRAZolam  1 mg Oral QHS  . aspirin EC  81 mg Oral QHS  . atorvastatin  40 mg Oral QHS  . furosemide  40 mg Oral BID  . guaiFENesin  600 mg Oral BID  . heparin subcutaneous  5,000 Units Subcutaneous Q8H  . insulin aspart  0-9 Units Subcutaneous TID WC  . levothyroxine  125 mcg Oral QAC breakfast  . metoprolol succinate  12.5 mg Oral Daily  . pantoprazole  40 mg Oral Daily  . polyethylene glycol  17 g Oral Daily  . sevelamer  carbonate  800 mg Oral BID WC  . sodium chloride  3 mL Intravenous Q12H   Continuous Infusions:   Principal Problem:   Acute respiratory failure with hypoxia Active Problems:   CKD (chronic kidney disease) stage 4, GFR 15-29 ml/min   DM (diabetes mellitus)   Pleural effusion - right   Acute on chronic systolic CHF (congestive heart failure)   Cardiorenal syndrome with renal failure    Time spent:    [facility]  [facility] Pager [phone]. If 7PM-7AM, please contact night-coverage at [email or web address], password  [email or web address] 06/04/2013, 12:02 PM  LOS: 6 days

## 2013-06-05 LAB — CBC
MCH: 28.9 pg (ref 26.0–34.0)
MCHC: 30.8 g/dL (ref 30.0–36.0)
MCV: 93.7 fL (ref 78.0–100.0)
Platelets: 203 10*3/uL (ref 150–400)
RBC: 3.95 MIL/uL (ref 3.87–5.11)
RDW: 15 % (ref 11.5–15.5)

## 2013-06-05 LAB — BASIC METABOLIC PANEL
BUN: 63 mg/dL — ABNORMAL HIGH (ref 6–23)
CO2: 41 mEq/L (ref 19–32)
Calcium: 8.9 mg/dL (ref 8.4–10.5)
Creatinine, Ser: 1.62 mg/dL — ABNORMAL HIGH (ref 0.50–1.10)
GFR calc non Af Amer: 29 mL/min — ABNORMAL LOW (ref >90)
Glucose, Bld: 134 mg/dL — ABNORMAL HIGH (ref 70–99)
Sodium: 136 mEq/L (ref 135–145)

## 2013-06-05 LAB — GLUCOSE, CAPILLARY
Glucose-Capillary: 169 mg/dL — ABNORMAL HIGH (ref 70–99)
Glucose-Capillary: 153 mg/dL — ABNORMAL HIGH (ref 70–99)

## 2013-06-05 MED ORDER — TORSEMIDE 20 MG PO TABS
20.0000 mg | ORAL_TABLET | Freq: Two times a day (BID) | ORAL | Status: DC
  Administered 2013-06-06 – 2013-06-08 (×5): 20 mg via ORAL
  Filled 2013-06-05 (×7): qty 1

## 2013-06-05 MED ORDER — TORSEMIDE 20 MG PO TABS
20.0000 mg | ORAL_TABLET | Freq: Two times a day (BID) | ORAL | Status: DC
  Filled 2013-06-05 (×2): qty 1

## 2013-06-05 NOTE — Progress Notes (Signed)
Pt O3x up in chair eating breakfast no complaints of pain or SOB. Pt request that MD call daughter Ishmael Holter.

## 2013-06-05 NOTE — Progress Notes (Signed)
Physical Therapy Treatment Patient Details Name: Miranda Cruz MRN: [id number] DOB: 1930-01-28 Today's Date: 06/05/2013 Time: 4098-1191 PT Time Calculation (min): 25 min  PT Assessment / Plan / Recommendation  History of Present Illness Patient is a 79 year old female with history of chronic systolic heart failure, recurrent pleural effusion with right pleurex Drain in Place, with numerous recent admission for decompensated heart failure, presented to the hospital with shortness of breath. Her symptoms were consistent with decompensated heart failure, during the hospital course she developed respiratory failure with hypoxia and was transferred to the step down unit and was placed on BiPAP. With further aggressive diureses, she clinically improved and has been off the BiPAP for the past 2 days. She is now being transferred to a telemetry unit, both the heart failure team and cardiology has been consulted.   PT Comments   Pt with c/o of R ear hearing impairment. Pt with increased assist required for transfers this date. Pt may benefit from SNF placement if family can not provide 24/7 assist/care.   Follow Up Recommendations  Home health PT;Supervision - Intermittent     Does the patient have the potential to tolerate intense rehabilitation     Barriers to Discharge        Equipment Recommendations  None recommended by PT    Recommendations for Other Services    Frequency Min 3X/week   Progress towards PT Goals Progress towards PT goals: Progressing toward goals  Plan Current plan remains appropriate    Precautions / Restrictions Precautions Precautions: Fall Restrictions Weight Bearing Restrictions: No   Pertinent Vitals/Pain Denies pain    Mobility  Bed Mobility Bed Mobility: Supine to Sit Supine to Sit: 2: Max assist;With rails;HOB elevated Details for Bed Mobility Assistance: pt states "my arms are so sore I can't use them" Transfers Transfers: Sit to Stand;Stand to Sit Sit to  Stand: 4: Min assist;From bed Stand to Sit: 4: Min assist;To chair/3-in-1 Stand Pivot Transfers: 3: Mod assist (to bedside commode) Details for Transfer Assistance: increased time Ambulation/Gait Ambulation/Gait Assistance: 4: Min assist Ambulation Distance (Feet): 75 Feet Assistive device: Rolling walker Ambulation/Gait Assistance Details: extremely slow pace, 4 standing rest breaks, mild SOB, SpO2 >91% on 2 Lo2 Gait Pattern: Step-through pattern;Decreased stride length;Narrow base of support Gait velocity: extremely slow Stairs: No    Exercises     PT Diagnosis:    PT Problem List:   PT Treatment Interventions:     PT Goals (current goals can now be found in the care plan section) Acute Rehab PT Goals Patient Stated Goal: didn't state  Visit Information  Last PT Received On: 06/05/13 Assistance Needed: +1 History of Present Illness: Patient is a 79 year old female with history of chronic systolic heart failure, recurrent pleural effusion with right pleurex Drain in Place, with numerous recent admission for decompensated heart failure, presented to the hospital with shortness of breath. Her symptoms were consistent with decompensated heart failure, during the hospital course she developed respiratory failure with hypoxia and was transferred to the step down unit and was placed on BiPAP. With further aggressive diureses, she clinically improved and has been off the BiPAP for the past 2 days. She is now being transferred to a telemetry unit, both the heart failure team and cardiology has been consulted.    Subjective Data  Subjective: Pt received supine in bed with c/o "I can't hear out of my Right ear" Patient Stated Goal: didn't state   Cognition  Cognition Arousal/Alertness: Awake/alert Behavior During Therapy:  WFL for tasks assessed/performed Overall Cognitive Status: Within Functional Limits for tasks assessed    Balance  Static Sitting Balance Static Sitting - Balance  Support: Feet supported Static Sitting - Level of Assistance: 3: Mod assist Static Sitting - Comment/# of Minutes: pt with R lateral lean and retropulsion  End of Session PT - End of Session Equipment Utilized During Treatment: Gait belt;Oxygen Activity Tolerance: Patient limited by fatigue Patient left: in chair;with call bell/phone within reach Nurse Communication: Mobility status   GP     Marcene Brawn 06/05/2013, 5:00 PM  Lewis Shock, PT, DPT Pager #: [phone] Office #: [phone]

## 2013-06-05 NOTE — Progress Notes (Signed)
Patient ID: Miranda Cruz, female   DOB: Nov 02, 1929, 79 y.o.   MRN: [id number] [facility] PROGRESS NOTE  ETHAL GOTAY WUJ:[id number] DOB: [birth year] DOA: 05/29/2013 PCP: Hoyle Sauer, MD  Assessment and Plan   1. Confusion: Improved today.  UA negative. CXR with B effusions. 2. Hypokalemia: repleted, normal today 3. Hypercarbia: will need to monitor carefully given recent resp failure 4. Acute respiratory failure with hypoxia improved. Now on [place] and comfortable. Secondary to Acute on chronic systolic heart failure  5. Acute on chronic systolic CHF (congestive heart failure) -1 lb up after starting oral lasix. Both CHF team/Cards following. not on ACEI given CKD. EF 35%.  Will follow with CHF clinic and CHF HH orders are in. 6. Pleural Effusion secondary to CHF. Small effusions on CXR. Cardiology notes indicate that drainage may be attempted with plurex cath.  7. Hypothyroidism -c/w Levothyroxine  8. DM -CBG's stable, continue ssi 9. Anemia- likley due to CKD, continue to monitor 10. CKD (chronic kidney disease) stage 3- 4, GFR 15-29 ml/min : creatinine has been trending up slightly, continue to monitor creatinine while on lasix  11. Disposition: this is a difficult situation.  She has very fragile heart failure with multiple admissions this year.  The family would like to keep her at home and is reluctant to involve hospice or consider a comfort care path.  They do want to keep her out of the hospital, and would like to try to be aggressive with her sodium, fluids and meds at home.  There are many siblings available to assist with care.  They are open to home health assistance.  They would like to follow up with the heart failure clinic. CHF HH orders are in. Will need to be stable on oral diuretics. 12. DVT Prophylaxis: Prophylactic Heparin   Code Status: DNI, limited  Family Communication: patient, daughters Clerance Lav (primary care giver) Disposition Plan: will remain  inpatient.  Consultants:  Cardiology, heart failure, pulmonology CCM Procedures:  none Antibiotics:  None  Overview:  Patient is a 79 year old female with history of chronic systolic heart failure, recurrent pleural effusion with right pleurex Drain in Place, with numerous recent admission for decompensated heart failure, presented to the hospital with shortness of breath. Her symptoms were consistent with decompensated heart failure, during the hospital course she developed respiratory failure with hypoxia and was transferred to the step down unit and was placed on BiPAP. With further aggressive diureses, she clinically improved and has been off the BiPAP for 3 days. She is now comfortable on [place] on telemetry unit, both the heart failure team and cardiology have been following.     Subjective: Doing well today. Feels that the right ear may be stopped up. Otherwise feels that she is at her baseline respiratory state.    Objective: Filed Vitals:   06/05/13 1159  BP:   Pulse:   Temp:   Resp: 20    Intake/Output Summary (Last 24 hours) at 06/05/13 1226 Last data filed at 06/05/13 1000  Gross per 24 hour  Intake    700 ml  Output    450 ml  Net    250 ml   Filed Weights   06/03/13 0617 06/04/13 0511 06/05/13 0518  Weight: 78.472 kg (173 lb) 77.384 kg (170 lb 9.6 oz) 77.928 kg (171 lb 12.8 oz)    Exam: General: Hard of hearing. Alert, cooperative Cardiovascular: distant, RRR, not able to appriciate MRG  Respiratory: coarse breath sounds with rhonchi and wheezes  throughout.  Abdomen: soft, non tender, non distended, bs normal  Extremities: No edema, pulses +1  Data Reviewed: Basic Metabolic Panel:  Recent Labs Lab 06/02/13 0500 06/02/13 1220 06/03/13 0605 06/04/13 0550 06/05/13 0450  NA 141 138 137 138 136  K 3.9 3.5 3.3* 3.2* 4.2  CL 95* 91* 87* 85* 83*  CO2 39* 39* 42* >45* 41*  GLUCOSE 114* 141* 114* 127* 134*  BUN 42* 44* 47* 54* 63*  CREATININE 1.46* 1.47*  1.43* 1.65* 1.62*  CALCIUM 8.7 8.7 8.8 8.9 8.9   Liver Function Tests: No results found for this basename: AST, ALT, ALKPHOS, BILITOT, PROT, ALBUMIN,  in the last 168 hours No results found for this basename: LIPASE, AMYLASE,  in the last 168 hours No results found for this basename: AMMONIA,  in the last 168 hours CBC:  Recent Labs Lab 05/29/13 2056 05/30/13 0630 05/31/13 0340 06/01/13 0740 06/04/13 0550 06/05/13 0450  WBC 8.2 7.3  --  10.3 11.2* 11.1*  NEUTROABS  --   --   --  8.4*  --   --   HGB 12.0 10.6* 10.7* 10.4* 11.4* 11.4*  HCT 37.8 34.1* 35.2* 33.8* 36.8 37.0  MCV 93.1 93.7  --  94.4 95.3 93.7  PLT 259 248  --  211 214 203   Cardiac Enzymes:  Recent Labs Lab 05/30/13 1424 05/30/13 2111 05/31/13 0340  TROPONINI <0.30 <0.30 <0.30   BNP (last 3 results)  Recent Labs  05/22/13 1512 05/29/13 2056 05/30/13 1424  PROBNP 11291.0* 9064.0* 8898.0*   CBG:  Recent Labs Lab 06/04/13 1050 06/04/13 1558 06/04/13 2226 06/05/13 0643 06/05/13 1152  GLUCAP 202* 148* 170* 169* 153*    Recent Results (from the past 240 hour(s))  MRSA PCR SCREENING     Status: None   Collection Time    05/30/13  4:14 PM      Result Value Range Status   MRSA by PCR NEGATIVE  NEGATIVE Final   Comment:            The GeneXpert MRSA Assay (FDA     approved for NASAL specimens     only), is one component of a     comprehensive MRSA colonization     surveillance program. It is not     intended to diagnose MRSA     infection nor to guide or     monitor treatment for     MRSA infections.     Studies: Dg Chest Port 1 View  06/04/2013   CLINICAL DATA:  Shortness of breath.  EXAM: PORTABLE CHEST - 1 VIEW  COMPARISON:  06/01/2013  FINDINGS: Lung base opacity is consistent with bilateral effusions with associated atelectasis and/or infiltrate. This is similar to the prior study allowing for differences in patient positioning. Right-sided chest tube is stable with its tip along the  upper medial right hemi thorax.  No pneumothorax.  The cardiac silhouette is mildly enlarged. The mediastinum is normal in contour.  IMPRESSION: No significant change from the most recent prior exam.  Bilateral effusions with associated lung base opacity, which may reflect atelectasis or infiltrate. Right chest tube is stable. No pneumothorax.   Electronically Signed   By: Amie Portland   On: 06/04/2013 17:08    Scheduled Meds: . allopurinol  100 mg Oral q morning - 10a  . ALPRAZolam  1 mg Oral QHS  . aspirin EC  81 mg Oral QHS  . atorvastatin  40 mg Oral QHS  .  guaiFENesin  600 mg Oral BID  . heparin subcutaneous  5,000 Units Subcutaneous Q8H  . insulin aspart  0-9 Units Subcutaneous TID WC  . levothyroxine  125 mcg Oral QAC breakfast  . loratadine  10 mg Oral Daily  . metoprolol succinate  12.5 mg Oral Daily  . pantoprazole  40 mg Oral Daily  . polyethylene glycol  17 g Oral Daily  . sevelamer carbonate  800 mg Oral BID WC  . sodium chloride  3 mL Intravenous Q12H  . [START ON 06/06/2013] torsemide  20 mg Oral BID   Continuous Infusions:   Principal Problem:   Acute respiratory failure with hypoxia Active Problems:   CKD (chronic kidney disease) stage 4, GFR 15-29 ml/min   DM (diabetes mellitus)   Pleural effusion - right   Acute on chronic systolic CHF (congestive heart failure)   Cardiorenal syndrome with renal failure    Time spent:    [facility]  [facility] Pager [phone]. If 7PM-7AM, please contact night-coverage at [email or web address], password [email or web address] 06/05/2013, 12:26 PM  LOS: 7 days

## 2013-06-05 NOTE — Progress Notes (Signed)
Advanced Heart Failure Rounding Note   Subjective:    Miranda Cruz is an 79 year old with a history of chronic systolic heart failure EF 35%, ICM, CKD baseline creatinine 1.8, A fib on eliquis, DM, hypothyroid, and recurrent pleural effusion with pleurex catheter.   CXR- bilateral pleural effusions L>R. Bibasilar atelectasis or consolidation. No drainage from Pleurex  Has decided on limited Code (No intubation or CPR)  Yesterday changed from lasix drip to PO 40 mg BID. Weight up 1 lb (Baseline weight ~ 175).. 24 hour I/O +350 cc. Feeling better. No dyspnea or CP. + orthopnea. Confusion improved.  Creatinine 1.3>1.46>1.6     Objective:   Weight Range:  Vital Signs:   Temp:  [97.3 F (36.3 C)-98.5 F (36.9 C)] 98.1 F (36.7 C) (09/29 0518) Pulse Rate:  [68-73] 68 (09/29 0518) Resp:  [19-20] 19 (09/29 0518) BP: (115-121)/(48-55) 121/55 mmHg (09/29 0518) SpO2:  [94 %-99 %] 99 % (09/28 2053) Weight:  [171 lb 12.8 oz (77.928 kg)] 171 lb 12.8 oz (77.928 kg) (09/29 0518) Last BM Date: 06/04/13 (per Pt)  Weight change: Filed Weights   06/03/13 0617 06/04/13 0511 06/05/13 0518  Weight: 173 lb (78.472 kg) 170 lb 9.6 oz (77.384 kg) 171 lb 12.8 oz (77.928 kg)    Intake/Output:   Intake/Output Summary (Last 24 hours) at 06/05/13 0815 Last data filed at 06/04/13 2015  Gross per 24 hour  Intake    800 ml  Output    450 ml  Net    350 ml     Physical Exam: General:  Elderly sitting in chair No resp difficulty HEENT: normal Neck: supple. JVP flat. Carotids 2+ bilat; no bruits. No lymphadenopathy or thryomegaly appreciated. Cor: PMI nondisplaced. Regular rate & rhythm. No rubs, gallops or murmurs. Lungs: Rhonchi thoughout. R pleurex catheter Abdomen: soft, nontender, nondistended. No hepatosplenomegaly. No bruits or masses. Good bowel sounds. Extremities: no cyanosis, clubbing, rash, no edema Neuro: alert & orientedx3, cranial nerves grossly intact. moves all 4 extremities w/o  difficulty. Affect pleasant  Telemetry: A fib 90s Labs: Basic Metabolic Panel:  Recent Labs Lab 06/02/13 0500 06/02/13 1220 06/03/13 0605 06/04/13 0550 06/05/13 0450  NA 141 138 137 138 136  K 3.9 3.5 3.3* 3.2* 4.2  CL 95* 91* 87* 85* 83*  CO2 39* 39* 42* >45* 41*  GLUCOSE 114* 141* 114* 127* 134*  BUN 42* 44* 47* 54* 63*  CREATININE 1.46* 1.47* 1.43* 1.65* 1.62*  CALCIUM 8.7 8.7 8.8 8.9 8.9    Liver Function Tests: No results found for this basename: AST, ALT, ALKPHOS, BILITOT, PROT, ALBUMIN,  in the last 168 hours No results found for this basename: LIPASE, AMYLASE,  in the last 168 hours No results found for this basename: AMMONIA,  in the last 168 hours  CBC:  Recent Labs Lab 05/29/13 2056 05/30/13 0630 05/31/13 0340 06/01/13 0740 06/04/13 0550 06/05/13 0450  WBC 8.2 7.3  --  10.3 11.2* 11.1*  NEUTROABS  --   --   --  8.4*  --   --   HGB 12.0 10.6* 10.7* 10.4* 11.4* 11.4*  HCT 37.8 34.1* 35.2* 33.8* 36.8 37.0  MCV 93.1 93.7  --  94.4 95.3 93.7  PLT 259 248  --  211 214 203    Cardiac Enzymes:  Recent Labs Lab 05/30/13 1424 05/30/13 2111 05/31/13 0340  TROPONINI <0.30 <0.30 <0.30    BNP: BNP (last 3 results)  Recent Labs  05/22/13 1512 05/29/13 2056 05/30/13 1424  PROBNP 11291.0* 9064.0* 8898.0*     Other results:   Imaging: Dg Chest Port 1 View  06/04/2013   CLINICAL DATA:  Shortness of breath.  EXAM: PORTABLE CHEST - 1 VIEW  COMPARISON:  06/01/2013  FINDINGS: Lung base opacity is consistent with bilateral effusions with associated atelectasis and/or infiltrate. This is similar to the prior study allowing for differences in patient positioning. Right-sided chest tube is stable with its tip along the upper medial right hemi thorax.  No pneumothorax.  The cardiac silhouette is mildly enlarged. The mediastinum is normal in contour.  IMPRESSION: No significant change from the most recent prior exam.  Bilateral effusions with associated lung  base opacity, which may reflect atelectasis or infiltrate. Right chest tube is stable. No pneumothorax.   Electronically Signed   By: Amie Portland   On: 06/04/2013 17:08     Medications:     Scheduled Medications: . allopurinol  100 mg Oral q morning - 10a  . ALPRAZolam  1 mg Oral QHS  . aspirin EC  81 mg Oral QHS  . atorvastatin  40 mg Oral QHS  . furosemide  40 mg Oral BID  . guaiFENesin  600 mg Oral BID  . heparin subcutaneous  5,000 Units Subcutaneous Q8H  . insulin aspart  0-9 Units Subcutaneous TID WC  . levothyroxine  125 mcg Oral QAC breakfast  . loratadine  10 mg Oral Cruz  . metoprolol succinate  12.5 mg Oral Cruz  . pantoprazole  40 mg Oral Cruz  . polyethylene glycol  17 g Oral Cruz  . sevelamer carbonate  800 mg Oral BID WC  . sodium chloride  3 mL Intravenous Q12H    Infusions:    PRN Medications: sodium chloride, acetaminophen, bisacodyl, feeding supplement, fluticasone, HYDROmorphone (DILAUDID) injection, levalbuterol, nitroGLYCERIN, ondansetron (ZOFRAN) IV, ondansetron, oxyCODONE, sodium chloride   Assessment:  1.A/C respiratory failure  2. A/C chronic systolic heart failure EF 35%  3. R pleural effusion with pluerex catheter  4. CKD creatinine baseline 1.5  5. Hypothyroid  6. DM  7. Cardiorenal syndrome with CHF  8. AF, chronic  9. Limited Code - No intubation or CPR. 1 shock and inotropes ok.     Plan/Discussion:    Switched to PO lasix yesterday. Weight up 1 lb. Will change PO lasix to 20 mg BID demadex. Cr increasing slightly 1.6, will need to continue to watch. CXR yesterday bilateral effustions, R pleurex catheter will try to drain today. Not on ACE-I due to CKD.   Will place for [facility] and PT  PT/OT following.   Length of Stay: 7 [name] 06/05/2013, 8:15 AM Advanced Heart Failure Team Pager [phone] (M-F; 7a - 4p)  Please contact [facility] for night-coverage after hours (4p -7a ) and weekends on [email or web address]  Patient  seen and examined with Miranda Potash, NP. We discussed all aspects of the encounter. I agree with the assessment and plan as stated above. Switch lasix to demadex. Will need to have stable oral diuretic regimen and renal function prior to d/c. Attempt to drain R pleurex catheter today. She is a bit dry today so need to be careful.   Daniel Bensimhon,MD 8:41 AM

## 2013-06-05 NOTE — Progress Notes (Signed)
I have reviewed heart failure team's note from this morning. I agree with current plan.  I agree with Demadex versus use of Lasix.  Continue to closely monitor creatinine which is increasing.

## 2013-06-05 NOTE — Progress Notes (Signed)
NCM obtained 2 drainage bags and reordering information for IR.  Supplies in pt room; ordering information given to Frankey Poot, [employer] school teacher.

## 2013-06-05 NOTE — Progress Notes (Signed)
Pulx drain bag order.   Pt stat on RA 84-86% . Put pt back on 2L Will continue to monitor

## 2013-06-05 NOTE — Progress Notes (Signed)
CRITICAL VALUE ALERT  Critical value received:  CO2 41  Date of notification:  06-05-13  Time of notification:  745  Critical value read back:yes  Nurse who received alert:  Ninetta Lights  MD notified (1st page):    Time of first page:    MD notified (2nd page):  Time of second page:  Responding MD:   Time MD responded:

## 2013-06-05 NOTE — Progress Notes (Signed)
Aspira plueral drain hooked up for drainage with no output.

## 2013-06-05 NOTE — Progress Notes (Signed)
Per MD request Attempted to drain pulx with bag in room. No drainage was returned.

## 2013-06-06 ENCOUNTER — Inpatient Hospital Stay (HOSPITAL_COMMUNITY): Payer: Medicare Other

## 2013-06-06 LAB — BASIC METABOLIC PANEL
BUN: 63 mg/dL — ABNORMAL HIGH (ref 6–23)
CO2: >45 mEq/L (ref 19–32)
GFR calc non Af Amer: 31 mL/min — ABNORMAL LOW (ref >90)
Glucose, Bld: 147 mg/dL — ABNORMAL HIGH (ref 70–99)
Potassium: 4.3 mEq/L (ref 3.5–5.1)
Sodium: 136 mEq/L (ref 135–145)

## 2013-06-06 LAB — GLUCOSE, CAPILLARY
Glucose-Capillary: 158 mg/dL — ABNORMAL HIGH (ref 70–99)
Glucose-Capillary: 185 mg/dL — ABNORMAL HIGH (ref 70–99)
Glucose-Capillary: 176 mg/dL — ABNORMAL HIGH (ref 70–99)
Glucose-Capillary: 212 mg/dL — ABNORMAL HIGH (ref 70–99)

## 2013-06-06 MED ORDER — ACETAZOLAMIDE 250 MG PO TABS
250.0000 mg | ORAL_TABLET | Freq: Two times a day (BID) | ORAL | Status: AC
  Administered 2013-06-06 (×2): 250 mg via ORAL
  Filled 2013-06-06 (×2): qty 1

## 2013-06-06 NOTE — Progress Notes (Signed)
CRITICAL VALUE ALERT  Critical value received:  CO >45  Date of notification:  9-30 Time of notification:  1015  Critical value read back:yes  Nurse who received alert:  Benny Lennert  MD notified (1st page):  10:25 walsh  Time of first page:    MD notified (2nd page):  Time of second page:  Responding MD:    Time MD responded:

## 2013-06-06 NOTE — Progress Notes (Signed)
Patient ID: Miranda Cruz, female   DOB: 1930/02/12, 79 y.o.   MRN: [id number] [facility] PROGRESS NOTE  Miranda Cruz:[id number] DOB: 03/04/1930 DOA: 05/29/2013 PCP: Hoyle Sauer, MD  Assessment and Plan   1. Hearing loss: ears are clear.  Suggest that she have hearing aids brought from home. 2. Hypokalemia: repleted, normal today 3. Hypercarbia: CO2 up again today. This may explain her grogginess earlier this morning.  She may have some nocturnal hypoventilation/OSA. Could consider CPAP if she would tolerate. 4. Acute respiratory failure with hypoxia: resolved. Now comfortable on [place]. Secondary to Acute on chronic systolic heart failure  5. Acute on chronic systolic CHF (congestive heart failure)  Both CHF team/Cards following. not on ACEI given CKD. EF 35%.  Will follow with CHF clinic and CHF HH orders are in.  Weight stable. 6. Pleural Effusion secondary to CHF. Small effusions on CXR. Drainage attempted with no output.  7. Hypothyroidism -c/w Levothyroxine  8. DM -CBG's stable, continue ssi 9. Anemia- likley due to CKD, continue to monitor 10. CKD (chronic kidney disease) stage 3- 4, GFR 15-29 ml/min : creatinine has been fairly stable, continue to monitor creatinine while on lasix  11. Disposition: this is a difficult situation.  She has very fragile heart failure with multiple admissions this year.  The family would like to keep her at home and is reluctant to involve hospice or consider a comfort care path.  They do want to keep her out of the hospital, and would like to try to be aggressive with her sodium, fluids and meds at home.  There are many siblings available to assist with care.  They are open to home health assistance.  They would like to follow up with the heart failure clinic. CHF HH planning is in process. I will defer the discission on when to discharge to the CHF team. 12. DVT Prophylaxis: Prophylactic Heparin   Code Status: DNI, limited  Family Communication:  patient, daughters Clerance Lav (primary care giver) Disposition Plan: will remain inpatient.  Consultants:  Cardiology, heart failure, pulmonology CCM Procedures:  none Antibiotics:  None  Overview:  Patient is a 79 year old female with history of chronic systolic heart failure, recurrent pleural effusion with right pleurex Drain in Place, with numerous recent admission for decompensated heart failure, presented to the hospital with shortness of breath. Her symptoms were consistent with decompensated heart failure, during the hospital course she developed respiratory failure with hypoxia and was transferred to the step down unit and was placed on BiPAP. With further aggressive diureses, she clinically improved and has been off the BiPAP for 3 days. She is now comfortable on  on telemetry unit, both the heart failure team and cardiology have been following.     Subjective: Seems like she felt very poorly and was lethargic earlier this morning. Currently family are present and she is alert and conversant.  Loss of hearing is very frustrating for her.  Objective: Filed Vitals:   06/06/13 1012  BP: 133/60  Pulse: 71  Temp:   Resp: 18    Intake/Output Summary (Last 24 hours) at 06/06/13 1030 Last data filed at 06/06/13 1017  Gross per 24 hour  Intake   1420 ml  Output    220 ml  Net   1200 ml   Filed Weights   06/04/13 0511 06/05/13 0518 06/06/13 0449  Weight: 77.384 kg (170 lb 9.6 oz) 77.928 kg (171 lb 12.8 oz) 77.021 kg (169 lb 12.8 oz)  Exam: General: Hard of hearing. Alert, cooperative, no distress Ears: no cerumen impaction, TMs both hazy but translucent, no erythema, no bulging Cardiovascular: distant, RRR, not able to appriciate MRG  Respiratory: coarse breath sounds with rhonchi and wheezes throughout.  Abdomen: soft, non tender, non distended, bs normal  Extremities: No edema, pulses +1  Data Reviewed: Basic Metabolic Panel:  Recent Labs Lab 06/02/13 1220  06/03/13 0605 06/04/13 0550 06/05/13 0450 06/06/13 0647  NA 138 137 138 136 136  K 3.5 3.3* 3.2* 4.2 4.3  CL 91* 87* 85* 83* 85*  CO2 39* 42* >45* 41* >45*  GLUCOSE 141* 114* 127* 134* 147*  BUN 44* 47* 54* 63* 63*  CREATININE 1.47* 1.43* 1.65* 1.62* 1.54*  CALCIUM 8.7 8.8 8.9 8.9 8.9   Liver Function Tests: No results found for this basename: AST, ALT, ALKPHOS, BILITOT, PROT, ALBUMIN,  in the last 168 hours No results found for this basename: LIPASE, AMYLASE,  in the last 168 hours No results found for this basename: AMMONIA,  in the last 168 hours CBC:  Recent Labs Lab 05/31/13 0340 06/01/13 0740 06/04/13 0550 06/05/13 0450  WBC  --  10.3 11.2* 11.1*  NEUTROABS  --  8.4*  --   --   HGB 10.7* 10.4* 11.4* 11.4*  HCT 35.2* 33.8* 36.8 37.0  MCV  --  94.4 95.3 93.7  PLT  --  211 214 203   Cardiac Enzymes:  Recent Labs Lab 05/30/13 1424 05/30/13 2111 05/31/13 0340  TROPONINI <0.30 <0.30 <0.30   BNP (last 3 results)  Recent Labs  05/22/13 1512 05/29/13 2056 05/30/13 1424  PROBNP 11291.0* 9064.0* 8898.0*   CBG:  Recent Labs Lab 06/05/13 0643 06/05/13 1152 06/05/13 1548 06/05/13 2151 06/06/13 0607  GLUCAP 169* 153* 248* 165* 158*    Recent Results (from the past 240 hour(s))  MRSA PCR SCREENING     Status: None   Collection Time    05/30/13  4:14 PM      Result Value Range Status   MRSA by PCR NEGATIVE  NEGATIVE Final   Comment:            The GeneXpert MRSA Assay (FDA     approved for NASAL specimens     only), is one component of a     comprehensive MRSA colonization     surveillance program. It is not     intended to diagnose MRSA     infection nor to guide or     monitor treatment for     MRSA infections.     Studies: Dg Chest Port 1 View  06/04/2013   CLINICAL DATA:  Shortness of breath.  EXAM: PORTABLE CHEST - 1 VIEW  COMPARISON:  06/01/2013  FINDINGS: Lung base opacity is consistent with bilateral effusions with associated atelectasis  and/or infiltrate. This is similar to the prior study allowing for differences in patient positioning. Right-sided chest tube is stable with its tip along the upper medial right hemi thorax.  No pneumothorax.  The cardiac silhouette is mildly enlarged. The mediastinum is normal in contour.  IMPRESSION: No significant change from the most recent prior exam.  Bilateral effusions with associated lung base opacity, which may reflect atelectasis or infiltrate. Right chest tube is stable. No pneumothorax.   Electronically Signed   By: Amie Portland   On: 06/04/2013 17:08    Scheduled Meds: . allopurinol  100 mg Oral q morning - 10a  . ALPRAZolam  1 mg Oral QHS  .  aspirin EC  81 mg Oral QHS  . atorvastatin  40 mg Oral QHS  . guaiFENesin  600 mg Oral BID  . heparin subcutaneous  5,000 Units Subcutaneous Q8H  . insulin aspart  0-9 Units Subcutaneous TID WC  . levothyroxine  125 mcg Oral QAC breakfast  . loratadine  10 mg Oral Daily  . metoprolol succinate  12.5 mg Oral Daily  . pantoprazole  40 mg Oral Daily  . polyethylene glycol  17 g Oral Daily  . sevelamer carbonate  800 mg Oral BID WC  . sodium chloride  3 mL Intravenous Q12H  . torsemide  20 mg Oral BID   Continuous Infusions:   Principal Problem:   Acute respiratory failure with hypoxia Active Problems:   CKD (chronic kidney disease) stage 4, GFR 15-29 ml/min   DM (diabetes mellitus)   Pleural effusion - right   Acute on chronic systolic CHF (congestive heart failure)   Cardiorenal syndrome with renal failure    Time spent:    [facility]  [facility] Pager [phone]. If 7PM-7AM, please contact night-coverage at [email or web address], password [facility] 06/06/2013, 10:30 AM  LOS: 8 days

## 2013-06-06 NOTE — Progress Notes (Signed)
Physical Therapy Treatment Patient Details Name: Miranda Cruz MRN: [id number] DOB: [birth year] Today's Date: 06/06/2013 Time: 4098-1191 PT Time Calculation (min): 33 min  PT Assessment / Plan / Recommendation  History of Present Illness Patient is a 79 year old female with history of chronic systolic heart failure, recurrent pleural effusion with right pleurex Drain in Place, with numerous recent admission for decompensated heart failure, presented to the hospital with shortness of breath. Her symptoms were consistent with decompensated heart failure, during the hospital course she developed respiratory failure with hypoxia and was transferred to the step down unit and was placed on BiPAP. With further aggressive diureses, she clinically improved and has been off the BiPAP for the past 2 days. She is now being transferred to a telemetry unit, both the heart failure team and cardiology has been consulted.   PT Comments   Pt with very little energy today and only able to tolerate minimal mobility.  Follow Up Recommendations  SNF     Does the patient have the potential to tolerate intense rehabilitation     Barriers to Discharge        Equipment Recommendations  None recommended by PT    Recommendations for Other Services    Frequency Min 3X/week   Progress towards PT Goals Progress towards PT goals: Not progressing toward goals - comment (very fatigued.)  Plan Discharge plan needs to be updated    Precautions / Restrictions Precautions Precautions: Fall Restrictions Weight Bearing Restrictions: No   Pertinent Vitals/Pain See flow sheet.    Mobility  Bed Mobility Supine to Sit: 2: Max assist;HOB elevated;With rails Details for Bed Mobility Assistance: Assist to bring trunk up. Transfers Sit to Stand: 3: Mod assist;With upper extremity assist;From bed Stand to Sit: 3: Mod assist;With upper extremity assist;With armrests;To chair/3-in-1 Stand Pivot Transfers: 3: Mod assist Details  for Transfer Assistance: Assist to bring hips up and to pivot hips. Ambulation/Gait Ambulation/Gait Assistance: 4: Min assist Ambulation Distance (Feet): 3 Feet Assistive device: Rolling walker Ambulation/Gait Assistance Details: Very small steps from [facility] to chair. Gait Pattern: Step-to pattern;Decreased step length - right;Decreased step length - left;Trunk flexed Gait velocity: extremely slow    Exercises     PT Diagnosis:    PT Problem List:   PT Treatment Interventions:     PT Goals (current goals can now be found in the care plan section)    Visit Information  Last PT Received On: 06/06/13 Assistance Needed: +1 History of Present Illness: Patient is a 79 year old female with history of chronic systolic heart failure, recurrent pleural effusion with right pleurex Drain in Place, with numerous recent admission for decompensated heart failure, presented to the hospital with shortness of breath. Her symptoms were consistent with decompensated heart failure, during the hospital course she developed respiratory failure with hypoxia and was transferred to the step down unit and was placed on BiPAP. With further aggressive diureses, she clinically improved and has been off the BiPAP for the past 2 days. She is now being transferred to a telemetry unit, both the heart failure team and cardiology has been consulted.    Subjective Data      Cognition  Cognition Arousal/Alertness: Awake/alert Behavior During Therapy: WFL for tasks assessed/performed Overall Cognitive Status: Within Functional Limits for tasks assessed    Balance  Static Sitting Balance Static Sitting - Balance Support: Feet supported Static Sitting - Level of Assistance: 4: Min assist Static Sitting - Comment/# of Minutes: Pt with posterior lean. Static Standing Balance Static  Standing - Balance Support: Bilateral upper extremity supported Static Standing - Level of Assistance: 4: Min assist  End of Session PT - End  of Session Equipment Utilized During Treatment: Gait belt;Oxygen Activity Tolerance: Patient limited by fatigue Patient left: in chair;with call bell/phone within reach Nurse Communication: Mobility status   GP     [facility] 06/06/2013, 9:57 AM  [facility] PT [phone]

## 2013-06-06 NOTE — Progress Notes (Addendum)
Pt lying in bed, O3x: Pt states they do not feel good and do not know what to do.Pt states her hearing is worst then normal.  No SOB or pain. Pt rec PRN for pain from night RN. Pt stated they did not feel like eating breakfast this AM. Pt on 1L o2 destat 85-86% on RA. Pt appears weaker then on RN assessment on 9-29. .  Will continue to monitor

## 2013-06-06 NOTE — Progress Notes (Addendum)
Pt and daughter; wanted RN inform MD that Pt is not near baseline. Pt states she gets SOB w/ active and has hard time breathing when lying flat. Pt states that she has felt like this all day. Pt not in Distress RR 22 93% on 1l. Put on 2L for comfort. Discuss Pt conditions and pain of care with Pt and daughter and MD concern Pt likey having to return to [facility] once D/c home. Daughter anxious over mothers conditions and tearful. Pt appears to be mildly depressed and having less hope then when RN was with Pt on 9-26. Will continue to monitor .

## 2013-06-06 NOTE — Progress Notes (Signed)
Subjective:  Can't hear well. She says came on recently. No appetite. She says if she would go home and die, maybe things would be better. She questions "Is medicine making me feel this bad?"  Objective:  Vital Signs in the last 24 hours: Temp:  [97.5 F (36.4 C)-98.2 F (36.8 C)] 98.2 F (36.8 C) (09/30 0449) Pulse Rate:  [70-71] 70 (09/30 0449) Resp:  [18-20] 18 (09/30 0449) BP: (119-135)/(46-61) 135/61 mmHg (09/30 0449) SpO2:  [85 %-99 %] 98 % (09/30 0449) Weight:  [77.021 kg (169 lb 12.8 oz)] 77.021 kg (169 lb 12.8 oz) (09/30 0449)  Intake/Output from previous day: 09/29 0701 - 09/30 0700 In: 880 [P.O.:880] Out: 220 [Urine:220]   Physical Exam: General: Elderly, in bed, depressed mood. Head:  Normocephalic and atraumatic. Lungs: Decreased bases. Heart: Normal S1 and S2.  No murmur, rubs or gallops. No JVD Abdomen: soft, non-tender, positive bowel sounds. Extremities: No clubbing or cyanosis. No edema. Neurologic: Alert MAE x 4    Lab Results:  Recent Labs  06/04/13 0550 06/05/13 0450  WBC 11.2* 11.1*  HGB 11.4* 11.4*  PLT 214 203    Recent Labs  06/05/13 0450 06/06/13 0647  NA 136 136  K 4.2 4.3  CL 83* 85*  CO2 41* PENDING  GLUCOSE 134* 147*  BUN 63* 63*  CREATININE 1.62* 1.54*    Imaging: Dg Chest Port 1 View  06/04/2013   CLINICAL DATA:  Shortness of breath.  EXAM: PORTABLE CHEST - 1 VIEW  COMPARISON:  06/01/2013  FINDINGS: Lung base opacity is consistent with bilateral effusions with associated atelectasis and/or infiltrate. This is similar to the prior study allowing for differences in patient positioning. Right-sided chest tube is stable with its tip along the upper medial right hemi thorax.  No pneumothorax.  The cardiac silhouette is mildly enlarged. The mediastinum is normal in contour.  IMPRESSION: No significant change from the most recent prior exam.  Bilateral effusions with associated lung base opacity, which may reflect atelectasis or  infiltrate. Right chest tube is stable. No pneumothorax.   Electronically Signed   By: Amie Portland   On: 06/04/2013 17:08    Assessment/Plan:  Principal Problem:   Acute respiratory failure with hypoxia Active Problems:   CKD (chronic kidney disease) stage 4, GFR 15-29 ml/min   DM (diabetes mellitus)   Pleural effusion - right   Acute on chronic systolic CHF (congestive heart failure)   Cardiorenal syndrome with renal failure  - Creat 1.54 from 1.62 today. Stable - Agree with demadex (net out -9liters) - Pleural drain with no output - ? Hearing. ? Lasix vs. Mechanical obstruction - My heart goes out to her. She is quite down this am. Reviewed Dr. Clent Ridges note as well as heart failure team. Family reluctant to involve hospice or comfort care. Continuing with aggressive medical tx. Heart failure clinic is something the family desires.      SKAINS, MARK 06/06/2013, 8:22 AM

## 2013-06-06 NOTE — Progress Notes (Signed)
Advanced Heart Failure Rounding Note   Subjective:    Miranda Cruz is an 79 year old with a history of chronic systolic heart failure EF 35%, ICM, CKD baseline creatinine 1.8, A fib on eliquis, DM, hypothyroid, and recurrent pleural effusion with pleurex catheter.   CXR- bilateral pleural effusions L>R. Bibasilar atelectasis or consolidation. No drainage from Pleurex  Has decided on limited Code (No intubation or CPR)  Got one dose of lasix po 40 mg yesterday.Total UOP 220 with 2 unmeasureed voiding's. Weight down 2 lbs. Complains of weakness this am and more difficulty hearing. Wonders if medicine is helping. PT recommends SNF.   Creatinine 1.3>1.46>1.6>1.54     Objective:   Weight Range:  Vital Signs:   Temp:  [97.5 F (36.4 C)-98.2 F (36.8 C)] 98.2 F (36.8 C) (09/30 0449) Pulse Rate:  [70-71] 71 (09/30 1012) Resp:  [18-20] 18 (09/30 1012) BP: (119-135)/(46-61) 133/60 mmHg (09/30 1012) SpO2:  [85 %-99 %] 97 % (09/30 1012) Weight:  [169 lb 12.8 oz (77.021 kg)] 169 lb 12.8 oz (77.021 kg) (09/30 0449) Last BM Date: 06/04/13 (per Pt)  Weight change: Filed Weights   06/04/13 0511 06/05/13 0518 06/06/13 0449  Weight: 170 lb 9.6 oz (77.384 kg) 171 lb 12.8 oz (77.928 kg) 169 lb 12.8 oz (77.021 kg)    Intake/Output:   Intake/Output Summary (Last 24 hours) at 06/06/13 1014 Last data filed at 06/06/13 0806  Gross per 24 hour  Intake   1000 ml  Output    220 ml  Net    780 ml     Physical Exam: General:  Elderly sitting in chair No resp difficulty HEENT: normal Neck: supple. JVP flat. Carotids 2+ bilat; no bruits. No lymphadenopathy or thryomegaly appreciated. Cor: PMI nondisplaced. Regular rate & rhythm. No rubs, gallops or murmurs. Lungs: markedly decreased at both bases  R pleurex catheter Abdomen: soft, nontender, nondistended. No hepatosplenomegaly. No bruits or masses. Good bowel sounds. Extremities: no cyanosis, clubbing, rash, no edema Neuro: alert & orientedx3,  hard of hearing  moves all 4 extremities w/o difficulty. Affect pleasant  Telemetry: A fib 90s Labs: Basic Metabolic Panel:  Recent Labs Lab 06/02/13 1220 06/03/13 0605 06/04/13 0550 06/05/13 0450 06/06/13 0647  NA 138 137 138 136 136  K 3.5 3.3* 3.2* 4.2 4.3  CL 91* 87* 85* 83* 85*  CO2 39* 42* >45* 41* PENDING  GLUCOSE 141* 114* 127* 134* 147*  BUN 44* 47* 54* 63* 63*  CREATININE 1.47* 1.43* 1.65* 1.62* 1.54*  CALCIUM 8.7 8.8 8.9 8.9 8.9    Liver Function Tests: No results found for this basename: AST, ALT, ALKPHOS, BILITOT, PROT, ALBUMIN,  in the last 168 hours No results found for this basename: LIPASE, AMYLASE,  in the last 168 hours No results found for this basename: AMMONIA,  in the last 168 hours  CBC:  Recent Labs Lab 05/31/13 0340 06/01/13 0740 06/04/13 0550 06/05/13 0450  WBC  --  10.3 11.2* 11.1*  NEUTROABS  --  8.4*  --   --   HGB 10.7* 10.4* 11.4* 11.4*  HCT 35.2* 33.8* 36.8 37.0  MCV  --  94.4 95.3 93.7  PLT  --  211 214 203    Cardiac Enzymes:  Recent Labs Lab 05/30/13 1424 05/30/13 2111 05/31/13 0340  TROPONINI <0.30 <0.30 <0.30    BNP: BNP (last 3 results)  Recent Labs  05/22/13 1512 05/29/13 2056 05/30/13 1424  PROBNP 11291.0* 9064.0* 8898.0*     Other results:  Imaging: Dg Chest Port 1 View  06/04/2013   CLINICAL DATA:  Shortness of breath.  EXAM: PORTABLE CHEST - 1 VIEW  COMPARISON:  06/01/2013  FINDINGS: Lung base opacity is consistent with bilateral effusions with associated atelectasis and/or infiltrate. This is similar to the prior study allowing for differences in patient positioning. Right-sided chest tube is stable with its tip along the upper medial right hemi thorax.  No pneumothorax.  The cardiac silhouette is mildly enlarged. The mediastinum is normal in contour.  IMPRESSION: No significant change from the most recent prior exam.  Bilateral effusions with associated lung base opacity, which may reflect  atelectasis or infiltrate. Right chest tube is stable. No pneumothorax.   Electronically Signed   By: Amie Portland   On: 06/04/2013 17:08     Medications:     Scheduled Medications: . allopurinol  100 mg Oral q morning - 10a  . ALPRAZolam  1 mg Oral QHS  . aspirin EC  81 mg Oral QHS  . atorvastatin  40 mg Oral QHS  . guaiFENesin  600 mg Oral BID  . heparin subcutaneous  5,000 Units Subcutaneous Q8H  . insulin aspart  0-9 Units Subcutaneous TID WC  . levothyroxine  125 mcg Oral QAC breakfast  . loratadine  10 mg Oral Daily  . metoprolol succinate  12.5 mg Oral Daily  . pantoprazole  40 mg Oral Daily  . polyethylene glycol  17 g Oral Daily  . sevelamer carbonate  800 mg Oral BID WC  . sodium chloride  3 mL Intravenous Q12H  . torsemide  20 mg Oral BID    Infusions:    PRN Medications: sodium chloride, acetaminophen, bisacodyl, feeding supplement, fluticasone, HYDROmorphone (DILAUDID) injection, levalbuterol, nitroGLYCERIN, ondansetron (ZOFRAN) IV, ondansetron, oxyCODONE, sodium chloride   Assessment:  1.A/C respiratory failure  2. A/C chronic systolic heart failure EF 35%  3. R pleural effusion with pluerex catheter  4. CKD creatinine baseline 1.5  5. Hypothyroid  6. DM  7. Cardiorenal syndrome with CHF  8. AF, chronic  9. Limited Code - No intubation or CPR. 1 shock and inotropes ok.     Plan/Discussion:    Lasix switched to PO demadex today. She continues to become more weak and SOB. Pleurex catheter had no drainage yesterday, possibly clogged?. CO2 increasing this am 45. PT recommends SNF, however family is persistent to take her home and continue aggressive medical treatment. Will continue to follow closely.  Not on ACE-I due to CKD.   PT/OT following.   Length of Stay: 8 [name] 06/06/2013, 10:14 AM Advanced Heart Failure Team Pager [phone] (M-F; 7a - 4p)  Please contact [facility] for night-coverage after hours (4p -7a ) and weekends on  [email or web address]  Volume status looks good. She is down 17 pounds. Now on po demadex. Bicarb is up. Will start diamox.  We are working to find stable home diuretic dose. Hopefully she can go home tomorrow with close f/u in the HF Clinic. I told her and her family that I was worried that she will struggle at home but she wants to give it a try.   She is getting to the point where Hospice may truly be the best option.  We will follow closely. IR to lok at Pleurex catheter today.  An Schnabel,MD 10:35 AM

## 2013-06-07 ENCOUNTER — Inpatient Hospital Stay (HOSPITAL_COMMUNITY): Payer: Medicare Other

## 2013-06-07 DIAGNOSIS — N179 Acute kidney failure, unspecified: Secondary | ICD-10-CM

## 2013-06-07 DIAGNOSIS — I2510 Atherosclerotic heart disease of native coronary artery without angina pectoris: Secondary | ICD-10-CM

## 2013-06-07 DIAGNOSIS — E119 Type 2 diabetes mellitus without complications: Secondary | ICD-10-CM

## 2013-06-07 DIAGNOSIS — I509 Heart failure, unspecified: Secondary | ICD-10-CM

## 2013-06-07 DIAGNOSIS — I2589 Other forms of chronic ischemic heart disease: Secondary | ICD-10-CM

## 2013-06-07 DIAGNOSIS — J9600 Acute respiratory failure, unspecified whether with hypoxia or hypercapnia: Secondary | ICD-10-CM

## 2013-06-07 DIAGNOSIS — I5023 Acute on chronic systolic (congestive) heart failure: Principal | ICD-10-CM

## 2013-06-07 DIAGNOSIS — Z515 Encounter for palliative care: Secondary | ICD-10-CM

## 2013-06-07 DIAGNOSIS — J90 Pleural effusion, not elsewhere classified: Secondary | ICD-10-CM

## 2013-06-07 LAB — CBC
MCH: 29.3 pg (ref 26.0–34.0)
MCHC: 31 g/dL (ref 30.0–36.0)
MCV: 94.6 fL (ref 78.0–100.0)
Platelets: 248 10*3/uL (ref 150–400)
RBC: 4.09 MIL/uL (ref 3.87–5.11)
RDW: 15.3 % (ref 11.5–15.5)

## 2013-06-07 LAB — GLUCOSE, CAPILLARY: Glucose-Capillary: 163 mg/dL — ABNORMAL HIGH (ref 70–99)

## 2013-06-07 LAB — BASIC METABOLIC PANEL
BUN: 64 mg/dL — ABNORMAL HIGH (ref 6–23)
Calcium: 9 mg/dL (ref 8.4–10.5)
Creatinine, Ser: 1.52 mg/dL — ABNORMAL HIGH (ref 0.50–1.10)
GFR calc Af Amer: 36 mL/min — ABNORMAL LOW (ref >90)
GFR calc non Af Amer: 31 mL/min — ABNORMAL LOW (ref >90)
Glucose, Bld: 148 mg/dL — ABNORMAL HIGH (ref 70–99)
Potassium: 3.8 mEq/L (ref 3.5–5.1)
Sodium: 136 mEq/L (ref 135–145)

## 2013-06-07 MED ORDER — LEVOFLOXACIN 500 MG PO TABS
500.0000 mg | ORAL_TABLET | ORAL | Status: DC
  Administered 2013-06-07: 12:00:00 500 mg via ORAL
  Filled 2013-06-07: qty 1

## 2013-06-07 MED ORDER — ACETAZOLAMIDE 250 MG PO TABS
250.0000 mg | ORAL_TABLET | Freq: Two times a day (BID) | ORAL | Status: AC
  Administered 2013-06-07 (×2): 250 mg via ORAL
  Filled 2013-06-07 (×2): qty 1

## 2013-06-07 MED ORDER — FUROSEMIDE 10 MG/ML IJ SOLN
40.0000 mg | Freq: Once | INTRAMUSCULAR | Status: AC
  Administered 2013-06-07: 40 mg via INTRAVENOUS
  Filled 2013-06-07: qty 4

## 2013-06-07 MED ORDER — GLUCERNA SHAKE PO LIQD
237.0000 mL | Freq: Three times a day (TID) | ORAL | Status: DC
  Administered 2013-06-07 – 2013-06-08 (×3): 237 mL via ORAL

## 2013-06-07 NOTE — Procedures (Signed)
Interventional Radiology Procedure Note  Procedure:  Manipulation of existing right tunneled pleural catheter Complications: None Recommendations: - After flushing and passing a wire through the tube lumen, I was able to drain 1,350 mL fluid from the right pleural space with the assistance of am evacuated container. - The fluid drains very slowly into the Aspira bag.  It will drain, but may take 1-2 hours to fill the bag.  Signed,  Sterling Big, MD [facility]

## 2013-06-07 NOTE — Progress Notes (Signed)
Advanced Heart Failure Rounding Note   Subjective:    Mrs Date is an 79 year old with a history of chronic systolic heart failure EF 35%, ICM, CKD baseline creatinine 1.8, A fib on eliquis, DM, hypothyroid, and recurrent pleural effusion with pleurex catheter.   CXR- bilateral pleural effusions L>R. Bibasilar atelectasis or consolidation. No drainage from Pleurex  Has decided on limited Code (No intubation or CPR)  Switched to PO demadex yesterday and started diamox 250 BID. Weight down another 3 lbs (not sure if accurate) and 24 hr I/O +1.2 liters. Went for chest [place] yesterday and moderate R pleural effusion and pleurex cath tip in place. In bed this am with orthopnea. Very weak. Feels cough is a bit better.  Creatinine 1.3>1.46>1.6>1.54>1.52 CO2 remains elevated 44. WBC up.    Objective:   Weight Range:  Vital Signs:   Temp:  [97.9 F (36.6 C)-98.3 F (36.8 C)] 98.3 F (36.8 C) (10/01 0500) Pulse Rate:  [70-72] 71 (10/01 0500) Resp:  [18-19] 19 (10/01 0500) BP: (125-139)/(49-99) 130/99 mmHg (10/01 0500) SpO2:  [94 %-99 %] 98 % (10/01 0500) Weight:  [166 lb 11.2 oz (75.615 kg)] 166 lb 11.2 oz (75.615 kg) (10/01 0500) Last BM Date: 06/04/13 (per Pt)  Weight change: Filed Weights   06/05/13 0518 06/06/13 0449 06/07/13 0500  Weight: 171 lb 12.8 oz (77.928 kg) 169 lb 12.8 oz (77.021 kg) 166 lb 11.2 oz (75.615 kg)    Intake/Output:   Intake/Output Summary (Last 24 hours) at 06/07/13 0921 Last data filed at 06/07/13 0851  Gross per 24 hour  Intake   1180 ml  Output      0 ml  Net   1180 ml     Physical Exam: General:  Elderly; laying in bed, +orthopnea HEENT: normal Neck: supple. JVP 8. Carotids 2+ bilat; no bruits. No lymphadenopathy or thryomegaly appreciated. Cor: PMI nondisplaced. Regular rate & rhythm. No rubs, gallops or murmurs. Lungs: markedly decreased at both bases; diffuse rhonchi throughout  R pleurex catheter Abdomen: soft, nontender, nondistended. No  hepatosplenomegaly. No bruits or masses. Good bowel sounds. Extremities: no cyanosis, clubbing, rash, trace edema Neuro: alert & orientedx3, hard of hearing  moves all 4 extremities w/o difficulty. Affect pleasant  Telemetry: A fib 90s Labs: Basic Metabolic Panel:  Recent Labs Lab 06/03/13 0605 06/04/13 0550 06/05/13 0450 06/06/13 0647 06/07/13 0710  NA 137 138 136 136 136  K 3.3* 3.2* 4.2 4.3 3.8  CL 87* 85* 83* 85* 85*  CO2 42* >45* 41* >45* 44*  GLUCOSE 114* 127* 134* 147* 148*  BUN 47* 54* 63* 63* 64*  CREATININE 1.43* 1.65* 1.62* 1.54* 1.52*  CALCIUM 8.8 8.9 8.9 8.9 9.0    Liver Function Tests: No results found for this basename: AST, ALT, ALKPHOS, BILITOT, PROT, ALBUMIN,  in the last 168 hours No results found for this basename: LIPASE, AMYLASE,  in the last 168 hours No results found for this basename: AMMONIA,  in the last 168 hours  CBC:  Recent Labs Lab 06/01/13 0740 06/04/13 0550 06/05/13 0450 06/07/13 0710  WBC 10.3 11.2* 11.1* 15.4*  NEUTROABS 8.4*  --   --   --   HGB 10.4* 11.4* 11.4* 12.0  HCT 33.8* 36.8 37.0 38.7  MCV 94.4 95.3 93.7 94.6  PLT 211 214 203 248    Cardiac Enzymes: No results found for this basename: CKTOTAL, CKMB, CKMBINDEX, TROPONINI,  in the last 168 hours  BNP: BNP (last 3 results)  Recent Labs  05/22/13 1512 05/29/13 2056 05/30/13 1424  PROBNP 11291.0* 9064.0* 8898.0*     Other results:   Imaging: [place] Chest  06/06/2013   CLINICAL DATA:  Right pleural effusion.  EXAM: CHEST ULTRASOUND  COMPARISON:  Chest x-ray dated 06/04/2013  FINDINGS: There is a moderate right pleural effusion. PleurX catheter is visible in the pleural fluid.  IMPRESSION: Moderate right pleural effusion.   Electronically Signed   By: Geanie Cooley   On: 06/06/2013 15:08     Medications:     Scheduled Medications: . allopurinol  100 mg Oral q morning - 10a  . ALPRAZolam  1 mg Oral QHS  . aspirin EC  81 mg Oral QHS  . atorvastatin  40 mg Oral  QHS  . guaiFENesin  600 mg Oral BID  . heparin subcutaneous  5,000 Units Subcutaneous Q8H  . insulin aspart  0-9 Units Subcutaneous TID WC  . levothyroxine  125 mcg Oral QAC breakfast  . loratadine  10 mg Oral Daily  . metoprolol succinate  12.5 mg Oral Daily  . pantoprazole  40 mg Oral Daily  . polyethylene glycol  17 g Oral Daily  . sevelamer carbonate  800 mg Oral BID WC  . sodium chloride  3 mL Intravenous Q12H  . torsemide  20 mg Oral BID    Infusions:    PRN Medications: sodium chloride, acetaminophen, bisacodyl, feeding supplement, fluticasone, HYDROmorphone (DILAUDID) injection, levalbuterol, nitroGLYCERIN, ondansetron (ZOFRAN) IV, ondansetron, oxyCODONE, sodium chloride   Assessment:  1.A/C respiratory failure  2. A/C chronic systolic heart failure EF 35%  3. R pleural effusion with pluerex catheter  4. CKD creatinine baseline 1.5  5. Hypothyroid  6. DM  7. Cardiorenal syndrome with CHF  8. AF, chronic  9. Limited Code - No intubation or CPR. 1 shock and inotropes ok.   Plan/Discussion:    Ms. Baris is unfortunately suffering from end-stage HF. She continues to have rhonchi throughout all lung fields and has a moderate R pleural effusion. The pleurex tip is in place inside effusion, however not draining. Will call IR today to see if they can tap.   Not sure if weight is accurate and she is +1.2 liters in past 24 hrs. Will give 40 mg IV lasix today. CO2 remains elevated. Will continue diamox 250 BID.  Long discussion with daughter today by phone about mothers prognosis and condition. Concerned that if she goes home that she will remain SOB and get into trouble with symptom management. They have agreed to have a meeting with hospice.     PT/OT following.   Length of Stay: 9 [name] 06/07/2013, 9:21 AM Advanced Heart Failure Team Pager [phone] (M-F; 7a - 4p)  Please contact [facility] for night-coverage after hours (4p -7a ) and weekends on  [email or web address]  Patient seen and examined with Ulla Potash, NP. We discussed all aspects of the encounter. I agree with the assessment and plan as stated above. She is clearly end stage and gets weaker every day, Respiratory status remains tenuous despite 20 pound diuresis. I think the only real option for her is Hospice. I discussed this with his daughter [name] by telephone and she agrees. They are interested in taking her home with Hospice tomorrow but would need aggressive symptom management as I feel she will get quite dyspneic at home. Will see if we can de-clog Pleurex today to help with symptoms.   Amelie Caracci,MD 9:32 AM

## 2013-06-07 NOTE — Progress Notes (Signed)
Utilization Review Completed Khian Remo J. Omere Marti, RN, BSN, NCM [phone]  

## 2013-06-07 NOTE — Consult Note (Signed)
Patient Miranda Cruz      DOB: 1930-09-07      WNU:[id number]  Preliminary Consult note:  Met with patient this am.  She is very weak, pale, and dyspneic with wet cough.  Her biggest concern is whether they will let her go home.  She could not quite summarize where the heart failure team felt she was at in her illness, but stated " I am not doing well."  She has elected to make herself a DNR per Dr. Kathi Ludwig note and I affirmed this with her daughter Miranda Cruz.  Miranda Cruz  Asks that they be able to talk with her this evening about changing services to hospice.  I will meet them after 630 pm to help them continue to work through the process of caring for [name].     Patient related not being comfortable at present because she was wet. Nursing assisted with changing her.  Concur with trying to see if we can thoracocentesis or get pleurx draining, agree with dilaudid, and lasix.    Addendum to follow this evenings meeting  Time: [phone] am plus call to [facility] L. Ladona Ridgel, MD MBA The Palliative Medicine Team at [facility] Phone: [phone] Pager: [phone]

## 2013-06-07 NOTE — Consult Note (Signed)
Patient Miranda Cruz      DOB: 04-03-1930      ZOX:[id number]     Consult Note from the Palliative Medicine Team at [facility]    Consult Requested by: Dr. Anne Hahn    PCP: Hoyle Sauer, MD Reason for Consultation:GOC    Phone Number:[phone] Related symptom recommendations Assessment of patients Current state: 79 yr old white female known to our service.  Presents with decompensation in her systolic heart failure, worsening generalized weakness.  Spoke with patient's daughter who has been working with Dr. Anne Hahn.  They are beginning to recognize that she is declining despite titration of medications.  She was found to have worsening effusion that was not being drained adequately by her pleurx catheter.  This was remedied this afternoon.  Patient is defering most decisions to her daughters.  I met with Miranda Cruz, Miranda Cruz and his wife and Miranda Cruz along with Dr. [name].  At this time, family is starting to shift towards more comfort approach.  They would like to continue to titrate medications for comfort but would like hospice to become involved to try to keep her home.  The patient is agreeable to additional amount of help.     Goals of Care: 1.  Code Status: DNR/DNI while in hospital may use pressors if needed for diuresis   2. Scope of Treatment: Family remains struggling with transitions of care to full comfort. At this time the scope of treatment discussed with Dr. Teena Dunk on would be aggressive medical titration of her cardiac meds for the purpose of comfort and not sure. Followup appointments in the clinic are to be maintained as long as the patient is able to travel but focusing more on continuing treatment at home has been requested by the family.   4. Disposition: Home with hospice care per family request   3. Symptom Management:   1. Anxiety/Agitation: Continue alprazolam 1 mg at bedtime 2. Pain: Currently has when necessary Dilaudid. She is not tolerant of morphine  derivatives but can take oxycodone 3. Bowel Regimen: Monitor closely as the patient has a tendency towards constipation 4. Delirium: At high risk but currently not symptomatic 5. Congestive heart failure/pleural effusion: Concur with looking at Pleurx catheter function. 4. Psychosocial: patient lives with her daughter Miranda Cruz. Her power of attorney his son Miranda Cruz. She has 5 children in total. Evidently she loves Miranda Cruz and she can tell me the names of every film that he has ever been in.  5. Spiritual: family is receiving support from their church, and hospice can offer chaplaincy services        Patient Documents Completed or Given: Document Given Completed  Advanced Directives Pkt    MOST  given to [facility] and to review and complete with Dr. [name]   DNR    Gone from My Sight    Hard Choices      Brief HPI: 60 yr white female with advanced systolic heart failure admitted with exacerbation.  Family and patient moving toward full comfort.  Patient has changed code status to DNR.  We were ask to assist with further GOC and symptom recommendations, assistance with initiating hospice care.   ROS: tired, cough, mild shortness of breath. Decreased appetite.    PMH:  Past Medical History  Diagnosis Date  . Myocardial infarction 1995  . CHF (congestive heart failure)   . Pacemaker   . Hypertension   . High cholesterol   . Pleural effusion on right 04/27/2013    /  notes 04/27/2013 (04/27/2013)  . Chronic kidney disease (CKD), stage IV (severe)     [name] 04/27/2013 (04/27/2013)  . Pneumonia 1948; 1954; 02/2013; ?04/27/2013    "real bad the first 2 times"; don't remember how bad in 02/2013; might have it now"  (04/27/2013)  . Exertional shortness of breath     "for the past week" (04/27/2013)  . Hypothyroidism   . Type II diabetes mellitus   . History of blood transfusion 2010    "w/hip replacement" (04/27/2013)  . Arthritis     "back; hands" (04/27/2013)  . Chronic lower back pain    . Gout     "several times; left ankle; fingers" (04/27/2013)     PSH: Past Surgical History  Procedure Laterality Date  . Coronary angioplasty  1995  . Coronary angioplasty with stent placement  2000    "?1" (04/27/2013)  . Abdominal wall mesh  removal  07/2007    "took the part out that was infected" (04/27/2013)  . Total hip arthroplasty Left 2009  . Total hip arthroplasty Right 2010  . Insert / replace / remove pacemaker  ~ 2011  . Hernia repair  06/2007    "umbilical hernia repair" (04/27/2013)  . Cataract extraction w/ intraocular lens implant Right 05/2012  . Joint replacement    . Thoracentesis Right 04/28/2013   I have reviewed the FH and SH and  If appropriate update it with new information. Allergies  Allergen Reactions  . Lisinopril     Informed by patient and family that medication causes kidney failure in patient and during last hospitalization was told by her cardiologist not to take this med.  . Morphine And Related Other (See Comments)    Made her loopy  . Phenergan [Promethazine Hcl] Other (See Comments)    Made her crazy  . Prednisone Other (See Comments)    Confusion, bad dreams, insomnia  . Trazodone And Nefazodone Other (See Comments)    hallucinations    Scheduled Meds: . acetaZOLAMIDE  250 mg Oral BID  . allopurinol  100 mg Oral q morning - 10a  . ALPRAZolam  1 mg Oral QHS  . aspirin EC  81 mg Oral QHS  . atorvastatin  40 mg Oral QHS  . furosemide  40 mg Intravenous Once  . guaiFENesin  600 mg Oral BID  . heparin subcutaneous  5,000 Units Subcutaneous Q8H  . insulin aspart  0-9 Units Subcutaneous TID WC  . levofloxacin  500 mg Oral Q48H  . levothyroxine  125 mcg Oral QAC breakfast  . loratadine  10 mg Oral Daily  . metoprolol succinate  12.5 mg Oral Daily  . pantoprazole  40 mg Oral Daily  . polyethylene glycol  17 g Oral Daily  . sevelamer carbonate  800 mg Oral BID WC  . sodium chloride  3 mL Intravenous Q12H  . torsemide  20 mg Oral BID    Continuous Infusions:  PRN Meds:.sodium chloride, acetaminophen, bisacodyl, feeding supplement, fluticasone, HYDROmorphone (DILAUDID) injection, levalbuterol, nitroGLYCERIN, ondansetron (ZOFRAN) IV, ondansetron, oxyCODONE, sodium chloride    BP 130/99  Pulse 71  Temp(Src) 98.3 F (36.8 C) (Oral)  Resp 19  Ht 5' 0.4" (1.534 m)  Wt 75.615 kg (166 lb 11.2 oz)  BMI 32.13 kg/m2  SpO2 98%   PPS: 30 %   Intake/Output Summary (Last 24 hours) at 06/07/13 1122 Last data filed at 06/07/13 0851  Gross per 24 hour  Intake    760 ml  Output  0 ml  Net    760 ml   LBM: PTA  Physical Exam:  General:  Very weak, very pale, no acute distress intermittent cough, hard of hearing HEENT:  PERRL, EOMI, poor dentition Chest:   Decreased with coarse wet rhonchi, no wheezing CVS: regular rate and rhythm, S1, S2 Abdomen:  Obese, soft, not tender Ext: edema , warm no mottling Neuro:oriented but very fatigued  Labs: CBC    Component Value Date/Time   WBC 15.4* 06/07/2013 0710   RBC 4.09 06/07/2013 0710   HGB 12.0 06/07/2013 0710   HCT 38.7 06/07/2013 0710   PLT 248 06/07/2013 0710   MCV 94.6 06/07/2013 0710   MCH 29.3 06/07/2013 0710   MCHC 31.0 06/07/2013 0710   RDW 15.3 06/07/2013 0710   LYMPHSABS 1.1 06/01/2013 0740   MONOABS 0.8 06/01/2013 0740   EOSABS 0.1 06/01/2013 0740   BASOSABS 0.0 06/01/2013 0740     CMP     Component Value Date/Time   NA 136 06/07/2013 0710   K 3.8 06/07/2013 0710   CL 85* 06/07/2013 0710   CO2 44* 06/07/2013 0710   GLUCOSE 148* 06/07/2013 0710   BUN 64* 06/07/2013 0710   CREATININE 1.52* 06/07/2013 0710   CALCIUM 9.0 06/07/2013 0710   PROT 6.2 05/22/2013 1512   ALBUMIN 2.4* 05/22/2013 1512   AST 42* 05/22/2013 1512   ALT 18 05/22/2013 1512   ALKPHOS 138* 05/22/2013 1512   BILITOT 0.5 05/22/2013 1512   GFRNONAA 31* 06/07/2013 0710   GFRAA 36* 06/07/2013 0710    Chest Xray Reviewed/Impressions: improved aeration in right base     Time In Time Out Total  Time Spent with Patient Total Overall Time  950 am /715 an 1015 / 815 am 75 min 85 min    Greater than 50%  of this time was spent counseling and coordinating care related to the above assessment and plan.  Jennifer Payes L. Ladona Ridgel, MD MBA The Palliative Medicine Team at [facility] Phone: [phone] Pager: [phone]

## 2013-06-07 NOTE — Progress Notes (Signed)
NUTRITION FOLLOW UP  Intervention:   Provide Glucerna Shake po TID, each supplement provides 220 kcal and 10 grams of protein.  Nutrition Dx:   Predicted suboptimal intake related to variable appetite as evidenced by nutrition assessment history; ongoing- PO intake 10-50% of meals  Goal:   Pt to meet >/= 90% of their estimated nutrition needs; not met  Monitor:   PO intake; 10-50% of meals, mostly 25% Weight; 20 lb wt decrease in past week; 11 lbs below Usual body weight Labs; high glucose, high BUN, low GFR   Assessment:   Pt reports poor appetite and eating very little. Pt likes drinking protein-shakes and pt's daughter has been bringing pt Ensure Active shakes. Current order is for Glucerna Shake once daily PRN- will change order to TID.  Per pt's daughter pt has mostly just been eating mashed potatoes but, has been drinking fairly well. Per nursing notes pt has been eating 10-50% of meals but, 25% of most meals.  Height: Ht Readings from Last 1 Encounters:  05/31/13 5' 0.4" (1.534 m)    Weight Status:   Wt Readings from Last 1 Encounters:  06/07/13 166 lb 11.2 oz (75.615 kg)    Re-estimated needs:  Kcal: 1700-1900  Protein: 80-90 gm  Fluid: per MD  Skin: +1 RLE and LLE edema; skin tear of mid sacrum  Diet Order: Dysphagia   Intake/Output Summary (Last 24 hours) at 06/07/13 1606 Last data filed at 06/07/13 1428  Gross per 24 hour  Intake    660 ml  Output      0 ml  Net    660 ml    Last BM: 9/28   Labs:   Recent Labs Lab 06/05/13 0450 06/06/13 0647 06/07/13 0710  NA 136 136 136  K 4.2 4.3 3.8  CL 83* 85* 85*  CO2 41* >45* 44*  BUN 63* 63* 64*  CREATININE 1.62* 1.54* 1.52*  CALCIUM 8.9 8.9 9.0  GLUCOSE 134* 147* 148*    CBG (last 3)   Recent Labs  06/06/13 2042 06/07/13 0642 06/07/13 1237  GLUCAP 212* 163* 181*    Scheduled Meds: . acetaZOLAMIDE  250 mg Oral BID  . allopurinol  100 mg Oral q morning - 10a  . ALPRAZolam  1 mg Oral  QHS  . aspirin EC  81 mg Oral QHS  . atorvastatin  40 mg Oral QHS  . guaiFENesin  600 mg Oral BID  . heparin subcutaneous  5,000 Units Subcutaneous Q8H  . insulin aspart  0-9 Units Subcutaneous TID WC  . levofloxacin  500 mg Oral Q48H  . levothyroxine  125 mcg Oral QAC breakfast  . loratadine  10 mg Oral Daily  . metoprolol succinate  12.5 mg Oral Daily  . pantoprazole  40 mg Oral Daily  . polyethylene glycol  17 g Oral Daily  . sevelamer carbonate  800 mg Oral BID WC  . sodium chloride  3 mL Intravenous Q12H  . torsemide  20 mg Oral BID    Continuous Infusions:   Ian Malkin RD, LDN Inpatient Clinical Dietitian Pager: [phone] After Hours Pager: [phone]

## 2013-06-07 NOTE — Progress Notes (Signed)
Pt a/o, very HOH, pt c/o pain PRN oxycode given as ordered, pt on 40mg  of IV lasix, inc of large amt of urine, pt stated she is comfortable, will continue to monitor

## 2013-06-07 NOTE — Progress Notes (Signed)
Subjective:  Laying flat in bed. Breathing may be a little better. No Cough this am.   Objective:  Vital Signs in the last 24 hours: Temp:  [97.9 F (36.6 C)-98.3 F (36.8 C)] 98.3 F (36.8 C) (10/01 0500) Pulse Rate:  [70-72] 71 (10/01 0500) Resp:  [18-19] 19 (10/01 0500) BP: (125-139)/(49-99) 130/99 mmHg (10/01 0500) SpO2:  [94 %-99 %] 98 % (10/01 0500) Weight:  [75.615 kg (166 lb 11.2 oz)] 75.615 kg (166 lb 11.2 oz) (10/01 0500)  Intake/Output from previous day: 09/30 0701 - 10/01 0700 In: 1240 [P.O.:1240] Out: -    Physical Exam: General: Elderly, in bed, no distress, frail  Head: Normocephalic and atraumatic.  Lungs: Decreased bases.  Heart: Normal S1 and S2. No murmur, rubs or gallops. No JVD  Abdomen: soft, non-tender, positive bowel sounds.  Extremities: No clubbing or cyanosis. No edema.  Neurologic: Alert MAE x 4     Lab Results:  Recent Labs  06/05/13 0450 06/07/13 0710  WBC 11.1* 15.4*  HGB 11.4* 12.0  PLT 203 248    Recent Labs  06/06/13 0647 06/07/13 0710  NA 136 136  K 4.3 3.8  CL 85* 85*  CO2 >45* 44*  GLUCOSE 147* 148*  BUN 63* 64*  CREATININE 1.54* 1.52*    Imaging: [place] Chest  06/06/2013   CLINICAL DATA:  Right pleural effusion.  EXAM: CHEST ULTRASOUND  COMPARISON:  Chest x-ray dated 06/04/2013  FINDINGS: There is a moderate right pleural effusion. PleurX catheter is visible in the pleural fluid.  IMPRESSION: Moderate right pleural effusion.   Electronically Signed   By: Geanie Cooley   On: 06/06/2013 15:08     Assessment/Plan:  Principal Problem:   Acute respiratory failure with hypoxia Active Problems:   CKD (chronic kidney disease) stage 4, GFR 15-29 ml/min   DM (diabetes mellitus)   Pleural effusion - right   Acute on chronic systolic CHF (congestive heart failure)   Cardiorenal syndrome with renal failure  -Creat stable. WBC 15 from 11.  -CO2 44 (Diamox) -Was on low dose Eliquis previously. Not currently on meds  sched -Discussed case with Dr. Gala Romney this am. Continue to approach issues such as hospice care.  -Challenging situation with her clinically showing signs of decline. -Reviewed RN note. Family concerned.  -appreciate team care  SKAINS, MARK 06/07/2013, 8:34 AM

## 2013-06-07 NOTE — Progress Notes (Addendum)
Patient ID: Miranda Cruz, female   DOB: 20-Apr-1930, 79 y.o.   MRN: [id number] [facility] PROGRESS NOTE  Miranda Cruz:[id number] DOB: 06-28-1930 DOA: 05/29/2013 PCP: Hoyle Sauer, MD  Assessment and Plan   1. Disposition: Fragile heart failure with multiple admissions this year and general decline.  Palliative care conference is scheduled with family this afternoon. 2. Acute on chronic respiratory failure with hypercarbia: No distress this am. No changes.  3. Acute on chronic systolic CHF (congestive heart failure)  Both CHF team/Cards following. not on ACEI given CKD. EF 35%.  Daily adjustments being made to diuretics. 4. Pleural Effusion: pleurex drain in place, but not functioning properly.  Repeat drainage to be attempted in room today. 5. Hypothyroidism -c/w Levothyroxine  6. DM -CBG's stable, continue ssi 7. Anemia- likley due to CKD, continue to monitor 8. CKD (chronic kidney disease) stage 3- 4, GFR 15-29 ml/min : close monitoring while on diuretics 9. Hearing loss: improved with hearing aid brought from home today. At baseline. 10. DVT Prophylaxis: Prophylactic Heparin   Code Status: DNI, limited  Family Communication: patient, daughters Clerance Lav (primary care giver) Disposition Plan: will remain inpatient.  Consultants:  Cardiology, heart failure, pulmonology CCM Antibiotics:  None  Overview:  Patient is a 79 year old female with history of chronic systolic heart failure, recurrent pleural effusion with right pleurex Drain in Place, with numerous recent admission for decompensated heart failure, presented to the hospital with shortness of breath. Her symptoms were consistent with decompensated heart failure, during the hospital course she developed respiratory failure with hypoxia and was transferred to the step down unit and was placed on BiPAP. With further aggressive diureses, she clinically improved and has been off the BiPAP. She is now comfortable on [place] on  telemetry unit, both the heart failure team and cardiology have been following.     Subjective: Calm. No distress. Spoke to her at length about her wishes for care.  She does not want to be a burden on her family, is scared to be alone.  She wants her family to make these decisions.  Objective: Filed Vitals:   06/07/13 0500  BP: 130/99  Pulse: 71  Temp: 98.3 F (36.8 C)  Resp: 19    Intake/Output Summary (Last 24 hours) at 06/07/13 1147 Last data filed at 06/07/13 0851  Gross per 24 hour  Intake    760 ml  Output      0 ml  Net    760 ml   Filed Weights   06/05/13 0518 06/06/13 0449 06/07/13 0500  Weight: 77.928 kg (171 lb 12.8 oz) 77.021 kg (169 lb 12.8 oz) 75.615 kg (166 lb 11.2 oz)    Exam: General: Hard of hearing. Alert, calm, no distress Cardiovascular: distant, RRR, not able to appriciate MRG  Respiratory: coarse breath sounds with rhonchi and wheezes throughout, no distress Abdomen: soft, non tender, non distended, bs normal  Extremities: No edema, pulses +1  Data Reviewed: Basic Metabolic Panel:  Recent Labs Lab 06/03/13 0605 06/04/13 0550 06/05/13 0450 06/06/13 0647 06/07/13 0710  NA 137 138 136 136 136  K 3.3* 3.2* 4.2 4.3 3.8  CL 87* 85* 83* 85* 85*  CO2 42* >45* 41* >45* 44*  GLUCOSE 114* 127* 134* 147* 148*  BUN 47* 54* 63* 63* 64*  CREATININE 1.43* 1.65* 1.62* 1.54* 1.52*  CALCIUM 8.8 8.9 8.9 8.9 9.0   Liver Function Tests: No results found for this basename: AST, ALT, ALKPHOS, BILITOT, PROT, ALBUMIN,  in  the last 168 hours No results found for this basename: LIPASE, AMYLASE,  in the last 168 hours No results found for this basename: AMMONIA,  in the last 168 hours CBC:  Recent Labs Lab 06/01/13 0740 06/04/13 0550 06/05/13 0450 06/07/13 0710  WBC 10.3 11.2* 11.1* 15.4*  NEUTROABS 8.4*  --   --   --   HGB 10.4* 11.4* 11.4* 12.0  HCT 33.8* 36.8 37.0 38.7  MCV 94.4 95.3 93.7 94.6  PLT 211 214 203 248   Cardiac Enzymes: No results  found for this basename: CKTOTAL, CKMB, CKMBINDEX, TROPONINI,  in the last 168 hours BNP (last 3 results)  Recent Labs  05/22/13 1512 05/29/13 2056 05/30/13 1424  PROBNP 11291.0* 9064.0* 8898.0*   CBG:  Recent Labs Lab 06/06/13 0607 06/06/13 1139 06/06/13 1616 06/06/13 2042 06/07/13 0642  GLUCAP 158* 185* 176* 212* 163*    Recent Results (from the past 240 hour(s))  MRSA PCR SCREENING     Status: None   Collection Time    05/30/13  4:14 PM      Result Value Range Status   MRSA by PCR NEGATIVE  NEGATIVE Final   Comment:            The GeneXpert MRSA Assay (FDA     approved for NASAL specimens     only), is one component of a     comprehensive MRSA colonization     surveillance program. It is not     intended to diagnose MRSA     infection nor to guide or     monitor treatment for     MRSA infections.     Studies: [place] Chest  06/06/2013   CLINICAL DATA:  Right pleural effusion.  EXAM: CHEST ULTRASOUND  COMPARISON:  Chest x-ray dated 06/04/2013  FINDINGS: There is a moderate right pleural effusion. PleurX catheter is visible in the pleural fluid.  IMPRESSION: Moderate right pleural effusion.   Electronically Signed   By: Geanie Cooley   On: 06/06/2013 15:08    Scheduled Meds: . acetaZOLAMIDE  250 mg Oral BID  . allopurinol  100 mg Oral q morning - 10a  . ALPRAZolam  1 mg Oral QHS  . aspirin EC  81 mg Oral QHS  . atorvastatin  40 mg Oral QHS  . guaiFENesin  600 mg Oral BID  . heparin subcutaneous  5,000 Units Subcutaneous Q8H  . insulin aspart  0-9 Units Subcutaneous TID WC  . levofloxacin  500 mg Oral Q48H  . levothyroxine  125 mcg Oral QAC breakfast  . loratadine  10 mg Oral Daily  . metoprolol succinate  12.5 mg Oral Daily  . pantoprazole  40 mg Oral Daily  . polyethylene glycol  17 g Oral Daily  . sevelamer carbonate  800 mg Oral BID WC  . sodium chloride  3 mL Intravenous Q12H  . torsemide  20 mg Oral BID   Continuous Infusions:   Principal Problem:    Acute respiratory failure with hypoxia Active Problems:   CKD (chronic kidney disease) stage 4, GFR 15-29 ml/min   DM (diabetes mellitus)   Pleural effusion - right   Acute on chronic systolic CHF (congestive heart failure)   Cardiorenal syndrome with renal failure    Time spent:    [facility]  [facility] Pager [phone]. If 7PM-7AM, please contact night-coverage at [email or web address], password [facility] 06/07/2013, 11:47 AM  LOS: 9 days

## 2013-06-08 DIAGNOSIS — J189 Pneumonia, unspecified organism: Secondary | ICD-10-CM

## 2013-06-08 DIAGNOSIS — E8779 Other fluid overload: Secondary | ICD-10-CM

## 2013-06-08 LAB — GLUCOSE, CAPILLARY
Glucose-Capillary: 171 mg/dL — ABNORMAL HIGH (ref 70–99)
Glucose-Capillary: 153 mg/dL — ABNORMAL HIGH (ref 70–99)
Glucose-Capillary: 186 mg/dL — ABNORMAL HIGH (ref 70–99)

## 2013-06-08 LAB — BASIC METABOLIC PANEL
CO2: >45 mEq/L (ref 19–32)
Chloride: 82 mEq/L — ABNORMAL LOW (ref 96–112)
Creatinine, Ser: 1.63 mg/dL — ABNORMAL HIGH (ref 0.50–1.10)
Glucose, Bld: 172 mg/dL — ABNORMAL HIGH (ref 70–99)
Potassium: 3.4 mEq/L — ABNORMAL LOW (ref 3.5–5.1)
Sodium: 135 mEq/L (ref 135–145)

## 2013-06-08 MED ORDER — TORSEMIDE 20 MG PO TABS: 20.0000 mg | ORAL_TABLET | Freq: Every day | ORAL | Status: DC

## 2013-06-08 MED ORDER — POLYETHYLENE GLYCOL 3350 17 G PO PACK: 17.0000 g | PACK | Freq: Every day | ORAL | Status: AC

## 2013-06-08 MED ORDER — OXYCODONE HCL 20 MG/ML PO CONC: 6.0000 mg | ORAL | Status: DC | PRN

## 2013-06-08 MED ORDER — METOPROLOL SUCCINATE 12.5 MG HALF TABLET: 12.5000 mg | ORAL_TABLET | Freq: Every day | ORAL | Status: DC

## 2013-06-08 MED ORDER — LEVOFLOXACIN 500 MG PO TABS: 500.0000 mg | ORAL_TABLET | ORAL | Status: DC

## 2013-06-08 MED ORDER — ALPRAZOLAM 1 MG PO TABS: 1.0000 mg | ORAL_TABLET | Freq: Every day | ORAL | Status: AC

## 2013-06-08 MED ORDER — POTASSIUM CHLORIDE CRYS ER 20 MEQ PO TBCR
40.0000 meq | EXTENDED_RELEASE_TABLET | Freq: Once | ORAL | Status: AC
  Administered 2013-06-08: 15:00:00 40 meq via ORAL
  Filled 2013-06-08: qty 2

## 2013-06-08 MED ORDER — GLUCERNA SHAKE PO LIQD: 237.0000 mL | Freq: Three times a day (TID) | ORAL | Status: AC

## 2013-06-08 MED FILL — Chlorhexidine Gluconate Liquid 4%: CUTANEOUS | Qty: 15 | Status: AC

## 2013-06-08 NOTE — Progress Notes (Signed)
1500 discharge instructions  And prescriptions given to pt and daugter both verbalized understanding . Awaiting for ambulance transport . Daughter aware

## 2013-06-08 NOTE — Progress Notes (Signed)
Advanced Heart Failure Rounding Note   Subjective:    Mrs Camerer is an 79 year old with a history of chronic systolic heart failure EF 35%, ICM, CKD baseline creatinine 1.8, A fib on eliquis, DM, hypothyroid, and recurrent pleural effusion with pleurex catheter.   Pleurex tube declotted yesterday drained 1.3L.  Weight down another 5 pounds. Feels better but still very weak. Cr stable. CO2 still > 45.   Objective:   Weight Range:  Vital Signs:   Temp:  [98 F (36.7 C)-98.3 F (36.8 C)] 98.1 F (36.7 C) (10/02 0436) Pulse Rate:  [70-72] 72 (10/02 0436) Resp:  [19-20] 19 (10/02 0436) BP: (103-123)/(38-52) 103/40 mmHg (10/02 0436) SpO2:  [93 %-96 %] 93 % (10/02 0436) Weight:  [72.893 kg (160 lb 11.2 oz)] 72.893 kg (160 lb 11.2 oz) (10/02 0436) Last BM Date: 06/04/13 (per Pt)  Weight change: Filed Weights   06/06/13 0449 06/07/13 0500 06/08/13 0436  Weight: 77.021 kg (169 lb 12.8 oz) 75.615 kg (166 lb 11.2 oz) 72.893 kg (160 lb 11.2 oz)    Intake/Output:   Intake/Output Summary (Last 24 hours) at 06/08/13 0916 Last data filed at 06/07/13 1928  Gross per 24 hour  Intake    477 ml  Output    201 ml  Net    276 ml     Physical Exam: General:  Elderly; laying in bed, frail appearing.  HEENT: normal Neck: supple. JVP flat. Carotids 2+ bilat; no bruits. No lymphadenopathy or thryomegaly appreciated. Cor: PMI nondisplaced. Regular rate & rhythm. No rubs, gallops or murmurs. Lungs: mild rhonchi Abdomen: soft, nontender, nondistended. No hepatosplenomegaly. No bruits or masses. Good bowel sounds. Extremities: no cyanosis, clubbing, rash, no edema Neuro: alert & orientedx3, hard of hearing  moves all 4 extremities w/o difficulty. Affect pleasant  Telemetry: A fib 90s Labs: Basic Metabolic Panel:  Recent Labs Lab 06/04/13 0550 06/05/13 0450 06/06/13 0647 06/07/13 0710 06/08/13 0626  NA 138 136 136 136 135  K 3.2* 4.2 4.3 3.8 3.4*  CL 85* 83* 85* 85* 82*  CO2 >45* 41*  >45* 44* >45*  GLUCOSE 127* 134* 147* 148* 172*  BUN 54* 63* 63* 64* 66*  CREATININE 1.65* 1.62* 1.54* 1.52* 1.63*  CALCIUM 8.9 8.9 8.9 9.0 8.8    Liver Function Tests: No results found for this basename: AST, ALT, ALKPHOS, BILITOT, PROT, ALBUMIN,  in the last 168 hours No results found for this basename: LIPASE, AMYLASE,  in the last 168 hours No results found for this basename: AMMONIA,  in the last 168 hours  CBC:  Recent Labs Lab 06/04/13 0550 06/05/13 0450 06/07/13 0710  WBC 11.2* 11.1* 15.4*  HGB 11.4* 11.4* 12.0  HCT 36.8 37.0 38.7  MCV 95.3 93.7 94.6  PLT 214 203 248    Cardiac Enzymes: No results found for this basename: CKTOTAL, CKMB, CKMBINDEX, TROPONINI,  in the last 168 hours  BNP: BNP (last 3 results)  Recent Labs  05/22/13 1512 05/29/13 2056 05/30/13 1424  PROBNP 11291.0* 9064.0* 8898.0*     Other results:   Imaging: Dg Chest 1 View  06/07/2013   CLINICAL DATA:  Pleural drain manipulation  EXAM: CHEST - 1 VIEW  COMPARISON:  06/04/2013  FINDINGS: Left subclavian sequential transvenous pacemaker leads project over right atrium and right ventricle.  Left thoracostomy tube again identified.  Enlargement of cardiac silhouette with pulmonary vascular congestion.  Bibasilar atelectasis versus consolidation, stable in left lower lobe and demonstrating improved aeration at right lung  base versus previous exam.  Skin fold projects over left chest.  No pneumothorax or acute osseous findings.  IMPRESSION: Improved aeration at right base without pneumothorax.  Enlargement of cardiac silhouette with pulmonary vascular congestion.  Persistent atelectasis versus consolidation in left lower lobe.   Electronically Signed   By: Ulyses Southward M.D.   On: 06/07/2013 18:31   [place] Chest  06/06/2013   CLINICAL DATA:  Right pleural effusion.  EXAM: CHEST ULTRASOUND  COMPARISON:  Chest x-ray dated 06/04/2013  FINDINGS: There is a moderate right pleural effusion. PleurX catheter is  visible in the pleural fluid.  IMPRESSION: Moderate right pleural effusion.   Electronically Signed   By: Geanie Cooley   On: 06/06/2013 15:08   Ir Radiologist Eval & Mgmt  06/07/2013   *RADIOLOGY REPORT*  IR RADIOLOGIST EVALUATION AND MANAGEMENT OF NINE DRAINING RIGHT TUNNELED PLEURAL DRAIN  Date: 06/07/2013  Clinical History: 79 year old female with history of CHF and chronic symptomatic recurrent right pleural effusion.  She had a tunneled pleural drain placed on May 11, 2013 with removal of 1.3 liters of pleural fluid.  She is currently admitted with shortness of breath.  Her pleural drain is not draining well and she has a confirmed right pleural effusion by ultrasound evaluation.  Procedures Performed: 1. Manipulation the right pleural drain including saline flushes and wire cannulation 2.  Successful aspiration of 1.35 liters of yellow serous pleural fluid  Interventional Radiologist:  Sterling Big, MD  PROCEDURE/FINDINGS:  The external portion of the right tunneled pleural drainage catheter was prepped and draped in the usual fashion.  Guide was removed from the tubing.  The catheter was then flushed with a hand injection of saline.  The catheter flushes easily but would not aspirate.  Therefore, a glide wire was advanced through the lumen of the catheter and manipulated.  This was performed several times. The catheter began to drain spontaneously.  The catheter was hooked to the normal Aspira drainage bag.  The catheter was draining albeit very slowly.  To expedite the process and provide maximal drainage, the catheter was hooked up to an evacuated collection bottle (Vacutainer).  A total of 1350 ml of clear yellow ascitic fluid was successfully aspirated.  The patient tolerated the procedure well.  There was no immediate complication.  IMPRESSION:  Successful aspiration of 1.35 liters of serous pleural fluid from the tunneled os.  Drainage catheter.  Of note, a significant degree of suction  was required for aspiration.  The catheter does drain into the drainage bag albeit very slowly.  Recommend having the patient lay as supine as she can tolerate during drainage attempts.  Also recommend leaving the bag in a gravity dependent position and attempted to drain for at least 1 - 2 hours as the drainage will likely be very slow.  Consider draining every other day.  Findings were discussed with Dr. Clent Ridges by Dr. Archer Asa immediately following the procedure.  Signed,  Sterling Big, MD [facility]   Original Report Authenticated By: Malachy Moan, M.D.     Medications:     Scheduled Medications: . allopurinol  100 mg Oral q morning - 10a  . ALPRAZolam  1 mg Oral QHS  . aspirin EC  81 mg Oral QHS  . atorvastatin  40 mg Oral QHS  . feeding supplement  237 mL Oral TID BM  . guaiFENesin  600 mg Oral BID  . heparin subcutaneous  5,000 Units Subcutaneous Q8H  .  insulin aspart  0-9 Units Subcutaneous TID WC  . levofloxacin  500 mg Oral Q48H  . levothyroxine  125 mcg Oral QAC breakfast  . loratadine  10 mg Oral Daily  . metoprolol succinate  12.5 mg Oral Daily  . pantoprazole  40 mg Oral Daily  . polyethylene glycol  17 g Oral Daily  . sevelamer carbonate  800 mg Oral BID WC  . sodium chloride  3 mL Intravenous Q12H  . torsemide  20 mg Oral BID    Infusions:    PRN Medications: sodium chloride, acetaminophen, bisacodyl, fluticasone, HYDROmorphone (DILAUDID) injection, levalbuterol, nitroGLYCERIN, ondansetron (ZOFRAN) IV, ondansetron, oxyCODONE, sodium chloride   Assessment:  1.A/C respiratory failure  2. A/C chronic systolic heart failure EF 35%  3. R pleural effusion with pluerex catheter  4. CKD creatinine baseline 1.5  5. Hypothyroid  6. DM  7. Cardiorenal syndrome with CHF  8. AF, chronic  9. Limited Code - No intubation or CPR. 1 shock and inotropes ok.   Plan/Discussion:    She will be going home  with Hospice today. She is dry. Would alternate demadex 20 daily and alternate with 20 bid.   Will arrange f/u in HF Clinic next week.   Daniel Bensimhon,MD 9:16 AM

## 2013-06-08 NOTE — Progress Notes (Addendum)
Notified by [facility], patient and family request services of [facility]) after discharge.  Patient information reviewed with Dr Elliot Gurney, [facility] Medical Director hospice eligible with dx: CHF.  Spoke briefly with patient at bedside, no family present she asks Clinical research associate to call her daughter-spoke with daughter Miranda Cruz [phone] to initiate education related to hospice services, philosophy and team approach to care - Miranda Cruz voiced she was under the impression someone from [facility] would be in the home for longer periods of time as she and other family work full time with good- reinforced [facility] services supplement care the family provides -Discussed [facility] team members (an Aide, primary care RN,SW, Materials engineer; chaplain) availability and options for private hire supplemental needs, respite care, and consideration of [facility] when appropriate; encouraged MaryAnn to further discuss with family need for support at home and to discuss with [facility] team to request as much support as possible in the home- Miranda Cruz voiced good understanding of information provided   Per notes and discussion plan is to d/c today by non-emergent transport Patient will go home with R Aspira pleurex catheter-  present prior to admission; last drained 06/07/13; Attending MD please note any specific instructions (ie drainage frequency) discharge instructions.  Please send completed GOLD DNR form home with patient  DME needs discussed daughter does not want a hospital bed at this time - she wants to re-evaluate this after patient is at home -current equipment in the home is a walker; BSC; lift chair and rail on personal bed; pt will need the following DME 1) light weight wheel chair and 2) Complete Oxygen Package B pt currently on O2 @ [facility] - oxygen will need to be in the home or a travel O2 tank will need to be available for patient when she gets home * please call daughter [name]  [phone] to arrange delivery today- daughter is at work and plans to comre to hospital approximately 1:30 pm to go over discharge information-she is requesting to be able to pick up a travel O2 tank when she comes to the hospital today so she can take this home  And it will be in the home when patient arrives in case O2 delivery is late - she is hopeful to co-ordinate delivery of O2 after 2-3 pm  Patient's attending physician with [facility] will be Dr Jones Broom; pharmacy [facility] [address] Initial paperwork faxed to [facility] Ref[facility]  Please notify [facility] when patient is ready to leave unit at d/c call [phone] (or [phone] if after 5 pm);  [facility] information and contact numbers also given to [facility] during phone discussion   Above information shared with [facility] Please call with any questions or concerns   Valente David, RN 06/08/2013, 11:12 AM [facility] Palliative Medicine Team RN Liaison [phone]

## 2013-06-08 NOTE — Progress Notes (Signed)
Reviewed chart notes. Appreciate Palliative care team and Hospice discussion Catheter R pleural space "de-clogged" with wire by IR. Excellent.

## 2013-06-08 NOTE — Progress Notes (Signed)
Patient Miranda Cruz      DOB: 12-12-1929      WUJ:[id number]   Palliative Medicine Team at [facility] Progress Note    Subjective:  Patient with loose cough. Very fatigued.  Discussed with Dr. [name] this am.  Plan for Dc with knowledge she will likely return to the hospital as family is still struggling with the balance of comfort and cure.  Miranda Cruz  Is coming to terms and is more resolute about her prognosis than her children.  We would be glad to to continue to follow as she returns.  I believe that she would be an excellent candidate for residential hospice but her daughter remains struggling with feelings that she will think they don't want to take care of her or they did not do enough to take care of her , when in reality her symptoms are going to be hard to manage at the end of life.       Filed Vitals:   06/08/13 0436  BP: 103/40  Pulse: 72  Temp: 98.1 F (36.7 C)  Resp: 19   Physical exam:   General: fatigued, but rouses easily PERRL,EOMI, skin grey, mmd ry poor dentition, very hard of hearing Chest some anterior rhonchi, no wheezing CVS: distant , S1, S2 ABd: obese soft, not tender not distended positive bowel sounds Ext: cool, not mottled. ,  Neuro: very hard of hearing   Lab Results  Component Value Date   CREATININE 1.63* 06/08/2013   BUN 66* 06/08/2013   NA 135 06/08/2013   K 3.4* 06/08/2013   CL 82* 06/08/2013   CO2 >45* 06/08/2013      Assessment and plan: 79 yr old with advanced heart failure admitted for decompensated systolic heart failure with respiratory failure.  Pleurx was assessed draining slowly.  Thoracocentesis took off 1.5 letters.  Diuretics being adjusted.   1.  DNR-  inotropes ok when in hospital.  MOST for given to family to review and complete at next CHF clinic visit.     2.  Dyspnea and pain: would recommend OxyIR liquid 20 mg/ml 5 mg sL q2 hrs prn    3.  Volume overload: discussed with Dr. [name].  Plan to alternate Demadex.   Patient will be followed in heart failure clinic.    4.  Hospice referral was made .  Emotional support and continued reinforcement of goals of care will be very important for hospice team.   Discussed with Dr. Sharl Ma, and Dr. [name]    Total time 35 min   Miranda Cruz L. Ladona Ridgel, MD MBA The Palliative Medicine Team at [facility] Phone: [phone] Pager: [phone]

## 2013-06-08 NOTE — Progress Notes (Signed)
CSW (Clinical Child psychotherapist) informed by pt nurse that transportation can be arranged. CSW called [facility]. CSW signing off.  Jaime Dome, LCSWA [phone]

## 2013-06-08 NOTE — Discharge Summary (Signed)
Physician Discharge Summary  Miranda Cruz ZOX:[id number] DOB: [birth year] DOA: 05/29/2013  PCP: Hoyle Sauer, MD  Admit date: 05/29/2013 Discharge date: 06/08/2013  Time spent: 50* minutes  Recommendations for Outpatient Follow-up:  1. Follow up cardiology on 06/15/13  Discharge Diagnoses:  Principal Problem:   Acute respiratory failure with hypoxia Active Problems:   CKD (chronic kidney disease) stage 4, GFR 15-29 ml/min   DM (diabetes mellitus)   Pleural effusion - right   Acute on chronic systolic CHF (congestive heart failure)   Cardiorenal syndrome with renal failure   Discharge Condition: Stable  Diet recommendation: Low salt diet  Filed Weights   06/06/13 0449 06/07/13 0500 06/08/13 0436  Weight: 77.021 kg (169 lb 12.8 oz) 75.615 kg (166 lb 11.2 oz) 72.893 kg (160 lb 11.2 oz)    History of present illness:  79 yo female with congestive heart failure, right-sided Aspira pleural drainage cath in place, who presents to [facility] ED with main concern of 2-3 days of progressively worsening shortness of breath, bilateral lower extremity edema, poor oral intake, malaise, non productive cough. Patient explains that she has pleural catheter placed and he has not been drained as her family did not know how to. Apparently she has changed the brand name and was unable to drain the catheter. Per family member who also provides history, pt has taken 120 mg of Lasix today with no significant improvement in her symptoms. Pt has been recently hospitalized approximately one week prior to this admission and at that time she has had 500 cc fluid drained from thoracentesis cath. No reported fevers, chills, no specific abdominal or urinary concerns, no specific focal neurological symptoms.  In ED, pt found to be in mild distress due to shortness of breath and unable to complete sentences, initial oxygen saturation in 80's and quickly improved with oxygen via [place]. It was noted that pt could not be weaned off  the oxygen due to persistent desaturations and [facility] asked to admit to telemetry floor for further evaluation and management.    Hospital Course:  Patient is a 79 year old female with history of chronic systolic heart failure, recurrent pleural effusion with right pleurex Drain in Place, with numerous recent admission for decompensated heart failure, presented to the hospital with shortness of breath. Her symptoms were consistent with decompensated heart failure, during the hospital course she developed respiratory failure with hypoxia and was transferred to the step down unit and was placed on BiPAP. With further aggressive diureses, she clinically improved and has been off the BiPAP. She is now comfortable on [place] on telemetry unit, both the heart failure team and cardiology were consulted.  Acute on chronic respiratory failure with hypercarbia: Stable, secondary to decompensated heart failure.Cardiology following .Acute on chronic systolic CHF (congestive heart failure) Both CHF team/Cards following. not on ACEI given CKD. EF 35%. Daily adjustments being made to diuretics. Coreg was changed to Toprol by Dr Anne Fu, Will be discharged on Demadex 20 mg po alternate with 20 mg po BID. Pleural Effusion: pleurex drain in place, but not functioning properly.so IR was consulted , Catheter R pleural space "de-clogged" with wire by IR. drained 1,350 mL fluid from the right pleural space with the assistance of am evacuated container. Hypothyroidism -c/w Levothyroxine  DM -CBG's stable,  Anemia- likley due to CKD, continue to monitor  CKD (chronic kidney disease) stage 3- 4, GFR 15-29 ml/min : close monitoring while on diuretics Hearing loss: improved with hearing aid brought from home today. At baseline  Palliative care was consulted,due to end stage heart failure and patient is now partial code and will go home on home hospice. Will follow up with Cardiology Dr Gala Romney in one week.  Procedures:  *Catheter R  pleural space "de-clogged" with wire  Consultations:  Cardiology  Heart failure team  Discharge Exam: Filed Vitals:   06/08/13 0952  BP: 100/41  Pulse: 70  Temp:   Resp: 18    General: Appear in no acute distress Cardiovascular: S1s2 RRR Respiratory: Clear bilaterally Abdomen: Soft, nontender Ext : no edema  Discharge Instructions  Discharge Orders   Future Appointments Provider Department Dept Phone   06/15/2013 10:20 AM [facility] [phone]   07/17/2013 11:30 AM Donato Schultz, MD [facility] [phone]   Future Orders Complete By Expires   Diet - low sodium heart healthy  As directed    Discharge instructions  As directed    Comments:     Can drain the Pleurex catheter 3 X week or as needed   Increase activity slowly  As directed        Medication List    STOP taking these medications       carvedilol 3.125 MG tablet  Commonly known as:  COREG     fluconazole 150 MG tablet  Commonly known as:  DIFLUCAN     furosemide 40 MG tablet  Commonly known as:  LASIX     oxyCODONE 5 MG immediate release tablet  Commonly known as:  Oxy IR/ROXICODONE  Replaced by:  oxyCODONE 100 MG/5ML concentrated solution      TAKE these medications       acetaminophen 500 MG tablet  Commonly known as:  TYLENOL  Take 1,000 mg by mouth daily as needed for pain.     allopurinol 100 MG tablet  Commonly known as:  ZYLOPRIM  Take 100 mg by mouth every morning.     ALPRAZolam 1 MG tablet  Commonly known as:  XANAX  Take 1 tablet (1 mg total) by mouth at bedtime.     aspirin EC 81 MG tablet  Take 81 mg by mouth at bedtime.     atorvastatin 40 MG tablet  Commonly known as:  LIPITOR  Take 40 mg by mouth at bedtime.     BENGAY EX  Apply 1 application topically daily as needed (hand pain).     feeding supplement Liqd  Take 237 mLs by mouth 3 (three) times daily between meals.     fluticasone 50  MCG/ACT nasal spray  Commonly known as:  FLONASE  Place 2 sprays into the nose 2 (two) times daily as needed for rhinitis or allergies.     glyBURIDE 2.5 MG tablet  Commonly known as:  DIABETA  Take 1.25 mg by mouth daily as needed (if sugar is over 120).     guaiFENesin 600 MG 12 hr tablet  Commonly known as:  MUCINEX  Take 1 tablet (600 mg total) by mouth 2 (two) times daily.     levalbuterol 45 MCG/ACT inhaler  Commonly known as:  XOPENEX HFA  Inhale 1-2 puffs into the lungs 3 (three) times daily as needed for wheezing or shortness of breath.     levofloxacin 500 MG tablet  Commonly known as:  LEVAQUIN  Take 1 tablet (500 mg total) by mouth every other day.     levothyroxine 125 MCG tablet  Commonly known as:  SYNTHROID, LEVOTHROID  Take 125  mcg by mouth daily before breakfast.     metoprolol succinate 12.5 mg Tb24 24 hr tablet  Commonly known as:  TOPROL-XL  Take 0.5 tablets (12.5 mg total) by mouth daily.     nitroGLYCERIN 0.4 MG SL tablet  Commonly known as:  NITROSTAT  Place 0.4 mg under the tongue as needed for chest pain. x3 doses as needed for chest pain     omega-3 acid ethyl esters 1 G capsule  Commonly known as:  LOVAZA  Take 1 g by mouth See admin instructions. Take 1 capsule (1 g) daily, take a 2nd capsule if needed for constipation     oxyCODONE 100 MG/5ML concentrated solution  Commonly known as:  ROXICODONE INTENSOL  Take 0.3 mLs (6 mg total) by mouth every 2 (two) hours as needed for pain.     pantoprazole 40 MG tablet  Commonly known as:  PROTONIX  Take 40 mg by mouth daily.     polyethylene glycol packet  Commonly known as:  MIRALAX / GLYCOLAX  Take 17 g by mouth daily.     sevelamer carbonate 800 MG tablet  Commonly known as:  RENVELA  Take 800 mg by mouth 2 (two) times daily. Breakfast and dinner     torsemide 20 MG tablet  Commonly known as:  DEMADEX  Take 1 tablet (20 mg total) by mouth daily. Give alternate demadex 20 daily and alternate  with 20 bid     Vitamin D (Ergocalciferol) 50000 UNITS Caps capsule  Commonly known as:  DRISDOL  Take 50,000 Units by mouth 3 (three) times a week. Approximately every other day       Allergies  Allergen Reactions  . Lisinopril     Informed by patient and family that medication causes kidney failure in patient and during last hospitalization was told by her cardiologist not to take this med.  . Morphine And Related Other (See Comments)    Made her loopy  . Phenergan [Promethazine Hcl] Other (See Comments)    Made her crazy  . Prednisone Other (See Comments)    Confusion, bad dreams, insomnia  . Trazodone And Nefazodone Other (See Comments)    hallucinations        Follow-up Information   Follow up with Arvilla Meres, MD On 06/15/2013. (at 10: 20 am)    Specialty:  Cardiology   Contact information:   [address] [place] [address] [phone]        The results of significant diagnostics from this hospitalization (including imaging, microbiology, ancillary and laboratory) are listed below for reference.    Significant Diagnostic Studies: Dg Chest 1 View  06/07/2013   CLINICAL DATA:  Pleural drain manipulation  EXAM: CHEST - 1 VIEW  COMPARISON:  06/04/2013  FINDINGS: Left subclavian sequential transvenous pacemaker leads project over right atrium and right ventricle.  Left thoracostomy tube again identified.  Enlargement of cardiac silhouette with pulmonary vascular congestion.  Bibasilar atelectasis versus consolidation, stable in left lower lobe and demonstrating improved aeration at right lung base versus previous exam.  Skin fold projects over left chest.  No pneumothorax or acute osseous findings.  IMPRESSION: Improved aeration at right base without pneumothorax.  Enlargement of cardiac silhouette with pulmonary vascular congestion.  Persistent atelectasis versus consolidation in left lower lobe.   Electronically Signed   By: Ulyses Southward M.D.   On:  06/07/2013 18:31   Dg Chest 2 View  05/29/2013   CLINICAL DATA:  Shortness of  breath and cough for 3 weeks.  EXAM: CHEST  2 VIEW  COMPARISON:  PA and lateral chest and right side down decubitus view of the chest 05/22/2013.  FINDINGS: Small to moderate bilateral pleural effusions and basilar airspace disease, both worse on the left, are again seen. Basilar airspace disease and the effusions appear slightly worsened. There is cardiomegaly and pulmonary vascular congestion. No pneumothorax is identified.  IMPRESSION: Some worsening of small to moderate bilateral pleural effusions and basilar airspace disease, worse on the left.  Cardiomegaly and pulmonary vascular congestion.   Electronically Signed   By: Drusilla Kanner M.D.   On: 05/29/2013 21:32   Dg Chest 2 View  05/22/2013   CLINICAL DATA:  Shortness of breath, cough, congestion. Pleural fluid, drain in place.  EXAM: CHEST  2 VIEW  COMPARISON:  05/10/2013  FINDINGS: Left pacer is in place. Cardiomegaly with vascular congestion. Suspect mild interstitial edema. Bibasilar atelectasis. Small left pleural effusion, increased since prior study. Right pleural drain in place without significant right effusion or pneumothorax.  IMPRESSION: Small left pleural effusion. Right pleural drain in place without visible significant effusion or pneumothorax.  Cardiomegaly, bibasilar atelectasis. Suspect mild pulmonary edema.   Electronically Signed   By: Charlett Nose M.D.   On: 05/22/2013 13:42   Dg Chest 2 View  05/10/2013   CLINICAL DATA:  79 year old female with pleural effusion.  EXAM: CHEST  2 VIEW  COMPARISON:  05/08/2013 and earlier.  FINDINGS: Seated AP and lateral views of the chest. Moderate right and small left pleural effusions have not significantly changed. Ventilation at the right lung base appears mildly decreased. Stable cardiac size and mediastinal contours. No pneumothorax. Pulmonary vascularity appears decreased. No overt edema. Stable visualized  osseous structures. Left chest cardiac pacemaker.  IMPRESSION: All moderate right and small left pleural effusions not significantly changed. Mildly decreased ventilation at the right lung base. Interval decreased pulmonary vascular congestion.   Electronically Signed   By: Augusto Gamble M.D.   On: 05/10/2013 19:00   [place] Chest  06/06/2013   CLINICAL DATA:  Right pleural effusion.  EXAM: CHEST ULTRASOUND  COMPARISON:  Chest x-ray dated 06/04/2013  FINDINGS: There is a moderate right pleural effusion. PleurX catheter is visible in the pleural fluid.  IMPRESSION: Moderate right pleural effusion.   Electronically Signed   By: Geanie Cooley   On: 06/06/2013 15:08   Dg Chest Right Decubitus  05/22/2013   CLINICAL DATA:  Pleural fluid, drain.  EXAM: CHEST - RIGHT DECUBITUS  COMPARISON:  Chest x-ray performed today and 05/10/2013  FINDINGS: A right side down decubitus view demonstrates moderate layering right pleural effusion. Right pleural drain in place.  IMPRESSION: Moderate layering right pleural effusion.   Electronically Signed   By: Charlett Nose M.D.   On: 05/22/2013 13:42   Dg Chest Port 1 View  06/04/2013   CLINICAL DATA:  Shortness of breath.  EXAM: PORTABLE CHEST - 1 VIEW  COMPARISON:  06/01/2013  FINDINGS: Lung base opacity is consistent with bilateral effusions with associated atelectasis and/or infiltrate. This is similar to the prior study allowing for differences in patient positioning. Right-sided chest tube is stable with its tip along the upper medial right hemi thorax.  No pneumothorax.  The cardiac silhouette is mildly enlarged. The mediastinum is normal in contour.  IMPRESSION: No significant change from the most recent prior exam.  Bilateral effusions with associated lung base opacity, which may reflect atelectasis or infiltrate. Right chest tube is stable. No  pneumothorax.   Electronically Signed   By: Amie Portland   On: 06/04/2013 17:08   Dg Chest Port 1 View  06/01/2013   CLINICAL DATA:   Cough and congestion  EXAM: PORTABLE CHEST - 1 VIEW  COMPARISON:  05/30/2013.  FINDINGS: Unchanged positioning of dual-chamber left approach pacer. Right-sided pleural drain in stable position.  No change in cardiomegaly. Haziness of the bilateral chest consistent with effusions, unchanged. Air bronchograms noted at the right base. No pneumothorax or edema.  IMPRESSION: 1. Stable volume bilateral pleural effusions. Right-sided pleural drain in stable position. 2. Bibasilar atelectasis or consolidation.   Electronically Signed   By: Tiburcio Pea   On: 06/01/2013 05:54   Dg Chest Port 1 View  05/30/2013   CLINICAL DATA:  Short of breath  EXAM: PORTABLE CHEST - 1 VIEW  COMPARISON:  05/29/2013  FINDINGS: Cardiac enlargement with vascular congestion and mild edema. Increasing bilateral effusions and bibasilar atelectasis.  IMPRESSION: Congestive heart failure with progression of vascular congestion and bilateral effusions.   Electronically Signed   By: Marlan Palau M.D.   On: 05/30/2013 16:34   Ir Radiologist Eval & Mgmt  06/07/2013   *RADIOLOGY REPORT*  IR RADIOLOGIST EVALUATION AND MANAGEMENT OF NINE DRAINING RIGHT TUNNELED PLEURAL DRAIN  Date: 06/07/2013  Clinical History: 79 year old female with history of CHF and chronic symptomatic recurrent right pleural effusion.  She had a tunneled pleural drain placed on May 11, 2013 with removal of 1.3 liters of pleural fluid.  She is currently admitted with shortness of breath.  Her pleural drain is not draining well and she has a confirmed right pleural effusion by ultrasound evaluation.  Procedures Performed: 1. Manipulation the right pleural drain including saline flushes and wire cannulation 2.  Successful aspiration of 1.35 liters of yellow serous pleural fluid  Interventional Radiologist:  Sterling Big, MD  PROCEDURE/FINDINGS:  The external portion of the right tunneled pleural drainage catheter was prepped and draped in the usual fashion.  Guide  was removed from the tubing.  The catheter was then flushed with a hand injection of saline.  The catheter flushes easily but would not aspirate.  Therefore, a glide wire was advanced through the lumen of the catheter and manipulated.  This was performed several times. The catheter began to drain spontaneously.  The catheter was hooked to the normal Aspira drainage bag.  The catheter was draining albeit very slowly.  To expedite the process and provide maximal drainage, the catheter was hooked up to an evacuated collection bottle (Vacutainer).  A total of 1350 ml of clear yellow ascitic fluid was successfully aspirated.  The patient tolerated the procedure well.  There was no immediate complication.  IMPRESSION:  Successful aspiration of 1.35 liters of serous pleural fluid from the tunneled os.  Drainage catheter.  Of note, a significant degree of suction was required for aspiration.  The catheter does drain into the drainage bag albeit very slowly.  Recommend having the patient lay as supine as she can tolerate during drainage attempts.  Also recommend leaving the bag in a gravity dependent position and attempted to drain for at least 1 - 2 hours as the drainage will likely be very slow.  Consider draining every other day.  Findings were discussed with Dr. Clent Ridges by Dr. Archer Asa immediately following the procedure.  Signed,  Sterling Big, MD [facility]   Original Report Authenticated By: Malachy Moan, M.D.   Ir Perc Pleural Drain  W/[place]well Cath W/img Guide  05/11/2013   *RADIOLOGY REPORT*  Clinical Data:  Recurrent symptomatic right-sided pleural effusion in patient with history of recurrent CHF, now seeking palliative therapy  INSERTION OF TUNNELED RIGHT SIDED PLEURAL DRAINAGE CATHETER  Comparison:  Ultrasound guided thoracentesis - 04/28/2013; chest radiograph - 05/10/2013; 05/08/2013; chest CT - 05/06/2013  Intravenous medications: Versed 2  mg IV; Fentanyl 75 mcg IV; Ancef 2 gram IV; Antibiotic was administered in an appropriate time interval for the procedure.  Total Moderate Sedation Time: 15 minutes.  Fluoroscopy time:  36 seconds  Complications:  None immediate  Procedure:  The procedure, risks, benefits, and alternatives were explained to the patient and the patient's daughter, who wish to proceed with the placement of this permanent pleural catheter as the patient is seeking palliative care.  The patient and the patient's wife understand and consent to the procedure.  The right lateral chest and right upper abdomen were prepped with Chlorhexidine in a sterile fashion, and a sterile drape was applied covering the operative field.  A sterile gown and sterile gloves were used for the procedure.  Initial ultrasound scanning and fluoroscopic imaging demonstrates a recurrent moderate to large right-sided pleural effusion.  Under direct ultrasound guidance, the right inferior lateral pleural space was accessed with an 19 gauge trocar needle after the overlying soft tissues were anesthetized with 1% lidocaine with epinephrine. An Amplatz superstiff wire was then advanced under fluoroscopy into the pleural space.  A 16 French tunneled pleural catheter was tunneled from an incision within the right upper abdominal quadrant to the access site. The pleural access site was serially dilated under fluoroscopy, ultimately allowing placement of a peel-away sheath.  The catheter was advanced through the peel-away sheath.  The sheath was then removed.  Final catheter positioning was confirmed with a fluoroscopic radiographic image.  The access incision was closed with subcutaneous subcuticular 4-0 Vicryl, Dermabond and Steri-Strips.  A Prolene retention suture was applied at the catheter exit site.  Large volume thoracentesis was performed through the new catheter utilizing provided bulb vacuum assisted drainage bag.  The patient tolerated the above procedure well  without immediate postprocedural complication.  Findings:  The catheter was placed via the right lateral chest wall.  Catheter course is towards the apex.  Approximately 1.3 liters of serous pleural fluid was able to be removed after catheter placement.  IMPRESSION:  Successful placement of permanent, tunneled right pleural drainage catheter via lateral approach.  Approximately 1.3 liters of serous pleural fluid was removed after catheter placement.   Original Report Authenticated By: Tacey Ruiz, MD    Microbiology: Recent Results (from the past 240 hour(s))  MRSA PCR SCREENING     Status: None   Collection Time    05/30/13  4:14 PM      Result Value Range Status   MRSA by PCR NEGATIVE  NEGATIVE Final   Comment:            The GeneXpert MRSA Assay (FDA     approved for NASAL specimens     only), is one component of a     comprehensive MRSA colonization     surveillance program. It is not     intended to diagnose MRSA     infection nor to guide or     monitor treatment for     MRSA infections.     Labs: Basic Metabolic Panel:  Recent Labs Lab 06/04/13 0550 06/05/13 0450 06/06/13 1610 06/07/13 0710 06/08/13 9604  NA 138 136 136 136 135  K 3.2* 4.2 4.3 3.8 3.4*  CL 85* 83* 85* 85* 82*  CO2 >45* 41* >45* 44* >45*  GLUCOSE 127* 134* 147* 148* 172*  BUN 54* 63* 63* 64* 66*  CREATININE 1.65* 1.62* 1.54* 1.52* 1.63*  CALCIUM 8.9 8.9 8.9 9.0 8.8   Liver Function Tests: No results found for this basename: AST, ALT, ALKPHOS, BILITOT, PROT, ALBUMIN,  in the last 168 hours No results found for this basename: LIPASE, AMYLASE,  in the last 168 hours No results found for this basename: AMMONIA,  in the last 168 hours CBC:  Recent Labs Lab 06/04/13 0550 06/05/13 0450 06/07/13 0710  WBC 11.2* 11.1* 15.4*  HGB 11.4* 11.4* 12.0  HCT 36.8 37.0 38.7  MCV 95.3 93.7 94.6  PLT 214 203 248   Cardiac Enzymes: No results found for this basename: CKTOTAL, CKMB, CKMBINDEX, TROPONINI,   in the last 168 hours BNP: BNP (last 3 results)  Recent Labs  05/22/13 1512 05/29/13 2056 05/30/13 1424  PROBNP 11291.0* 9064.0* 8898.0*   CBG:  Recent Labs Lab 06/07/13 1237 06/07/13 1646 06/07/13 2109 06/08/13 0616 06/08/13 1113  GLUCAP 181* 170* 183* 171* 153*       Signed:  Kassy Mcenroe S  [facility] 06/08/2013, 1:08 PM

## 2013-06-15 ENCOUNTER — Encounter (HOSPITAL_COMMUNITY): Payer: Self-pay

## 2013-06-15 ENCOUNTER — Ambulatory Visit (HOSPITAL_COMMUNITY)
Admit: 2013-06-15 | Discharge: 2013-06-15 | Disposition: A | Discharge status: Home / Self Care | Attending: Cardiology | Admitting: Cardiology

## 2013-06-15 VITALS — BP 118/64 | HR 69 | Wt 163.0 lb

## 2013-06-15 DIAGNOSIS — I1310 Hypertensive heart and chronic kidney disease without heart failure, with stage 1 through stage 4 chronic kidney disease, or unspecified chronic kidney disease: Secondary | ICD-10-CM | POA: Insufficient documentation

## 2013-06-15 DIAGNOSIS — J90 Pleural effusion, not elsewhere classified: Secondary | ICD-10-CM | POA: Insufficient documentation

## 2013-06-15 DIAGNOSIS — N189 Chronic kidney disease, unspecified: Secondary | ICD-10-CM | POA: Insufficient documentation

## 2013-06-15 DIAGNOSIS — E119 Type 2 diabetes mellitus without complications: Secondary | ICD-10-CM | POA: Insufficient documentation

## 2013-06-15 DIAGNOSIS — I509 Heart failure, unspecified: Secondary | ICD-10-CM | POA: Insufficient documentation

## 2013-06-15 DIAGNOSIS — I2510 Atherosclerotic heart disease of native coronary artery without angina pectoris: Secondary | ICD-10-CM | POA: Insufficient documentation

## 2013-06-15 DIAGNOSIS — I2589 Other forms of chronic ischemic heart disease: Secondary | ICD-10-CM | POA: Insufficient documentation

## 2013-06-15 DIAGNOSIS — I5022 Chronic systolic (congestive) heart failure: Secondary | ICD-10-CM | POA: Insufficient documentation

## 2013-06-15 DIAGNOSIS — N19 Unspecified kidney failure: Secondary | ICD-10-CM

## 2013-06-15 DIAGNOSIS — Z79899 Other long term (current) drug therapy: Secondary | ICD-10-CM | POA: Insufficient documentation

## 2013-06-15 DIAGNOSIS — I4891 Unspecified atrial fibrillation: Secondary | ICD-10-CM | POA: Insufficient documentation

## 2013-06-15 DIAGNOSIS — I5023 Acute on chronic systolic (congestive) heart failure: Secondary | ICD-10-CM

## 2013-06-15 LAB — BASIC METABOLIC PANEL
CO2: 40 mEq/L (ref 19–32)
Calcium: 8.8 mg/dL (ref 8.4–10.5)
Creatinine, Ser: 1.73 mg/dL — ABNORMAL HIGH (ref 0.50–1.10)
GFR calc Af Amer: 31 mL/min — ABNORMAL LOW (ref >90)
GFR calc non Af Amer: 27 mL/min — ABNORMAL LOW (ref >90)
Potassium: 3.3 mEq/L — ABNORMAL LOW (ref 3.5–5.1)
Sodium: 135 mEq/L (ref 135–145)

## 2013-06-15 NOTE — Patient Instructions (Addendum)
Labs today  You have been referred to Pulmonary  Your physician recommends that you schedule a follow-up appointment in: 1 month

## 2013-06-15 NOTE — Progress Notes (Signed)
Patient ID: Miranda Cruz, female   DOB: [birth year], 79 y.o.   MRN: [id number] PCP: Dr. Felipa Eth  82 with history of chronic atrial fibrillation, CAD, ischemic cardiomyopathy with end stage CHF and cardiorenal syndrome presents for followup after recent hospitalization.  Patient was admitted in 9/14 with CHF and bilateral pleural effusions.  She had had a Pleurx catheter on the right prior to admission. She was diuresed and sent home to live with her daughter. Hospice is now involved.  Today, she says that she feels "80%" better than when she was in the hospital.  She is now on home oxygen.  She is not very active but is not short of breath walking in the house with her walker.  She feels weak.  No chest pain.    Labs (10/14): K 3.4, creatinine 1.63  PMH: 1. CKD: Cardiorenal component.  2. HTN 3. Type II diabetes 4. Gout 5. CAD: s/p PCI in 1995 and 2000. 6. Bilateral THR 7. Hernia repair 8. Recurrent right pleural effusion: Likely from CHF.  Has Pleurx Catheter.   9. Ischemic cardiomyopathy: Echo (8/14) with EF 30-35%, dyskinetic apex and anteroseptal wall.   10. Atrial fibrillation: Chronic.  Never on coumadin.   SH: Lives with daughter, nonsmoker.   FH: No premature CAD  ROS: All systems reviewed and negative except as per HPI.   Current Outpatient Prescriptions  Medication Sig Dispense Refill  . acetaminophen (TYLENOL) 500 MG tablet Take 1,000 mg by mouth daily as needed for pain.      Marland Kitchen allopurinol (ZYLOPRIM) 100 MG tablet Take 100 mg by mouth every morning.       Marland Kitchen ALPRAZolam (XANAX) 1 MG tablet Take 1 tablet (1 mg total) by mouth at bedtime.  30 tablet  0  . aspirin EC 81 MG tablet Take 81 mg by mouth at bedtime.      Marland Kitchen atorvastatin (LIPITOR) 40 MG tablet Take 40 mg by mouth at bedtime.       . feeding supplement (GLUCERNA SHAKE) LIQD Take 237 mLs by mouth 3 (three) times daily between meals.  30 Can  0  . fluticasone (FLONASE) 50 MCG/ACT nasal spray Place 2 sprays into the nose 2 (two)  times daily as needed for rhinitis or allergies.      Marland Kitchen glyBURIDE (DIABETA) 2.5 MG tablet Take 1.25 mg by mouth daily as needed (if sugar is over 120).      Marland Kitchen guaiFENesin (MUCINEX) 600 MG 12 hr tablet Take 1 tablet (600 mg total) by mouth 2 (two) times daily.  60 tablet  0  . levalbuterol (XOPENEX HFA) 45 MCG/ACT inhaler Inhale 1-2 puffs into the lungs 3 (three) times daily as needed for wheezing or shortness of breath.      . levofloxacin (LEVAQUIN) 500 MG tablet Take 1 tablet (500 mg total) by mouth every other day.  5 tablet  0  . levothyroxine (SYNTHROID, LEVOTHROID) 125 MCG tablet Take 125 mcg by mouth daily before breakfast.       . Menthol, Topical Analgesic, (BENGAY EX) Apply 1 application topically daily as needed (hand pain).      . metoprolol succinate (TOPROL-XL) 12.5 mg TB24 24 hr tablet Take 0.5 tablets (12.5 mg total) by mouth daily.  30 tablet  1  . nitroGLYCERIN (NITROSTAT) 0.4 MG SL tablet Place 0.4 mg under the tongue as needed for chest pain. x3 doses as needed for chest pain      . omega-3 acid ethyl esters (LOVAZA) 1  G capsule Take 1 g by mouth See admin instructions. Take 1 capsule (1 g) daily, take a 2nd capsule if needed for constipation      . oxyCODONE (ROXICODONE INTENSOL) 100 MG/5ML concentrated solution Take 0.3 mLs (6 mg total) by mouth every 2 (two) hours as needed for pain.  30 mL  0  . pantoprazole (PROTONIX) 40 MG tablet Take 40 mg by mouth daily.      . polyethylene glycol (MIRALAX / GLYCOLAX) packet Take 17 g by mouth daily.  14 each  0  . sevelamer carbonate (RENVELA) 800 MG tablet Take 800 mg by mouth 2 (two) times daily. Breakfast and dinner      . torsemide (DEMADEX) 20 MG tablet Take 1 tablet (20 mg total) by mouth daily. Give alternate demadex 20 daily and alternate with 20 bid  30 tablet  0  . Vitamin D, Ergocalciferol, (DRISDOL) 50000 UNITS CAPS Take 50,000 Units by mouth 3 (three) times a week. Approximately every other day       No current  facility-administered medications for this encounter.    BP 118/64  Pulse 69  Wt 163 lb (73.936 kg)  BMI 31.42 kg/m2  SpO2 99% General: NAD, chronically ill-appearing Neck: No JVD, no thyromegaly or thyroid nodule.  Lungs: Decreased breath sounds and crackles at the bases.  CV: Nondisplaced PMI.  Heart irregular S1/S2, no S3/S4, no murmur.  No peripheral edema.  No carotid bruit.  Normal pedal pulses.  Abdomen: Soft, nontender, no hepatosplenomegaly, no distention.  Skin: Intact without lesions or rashes.  Neurologic: Alert and oriented x 3.  Psych: Normal affect. Extremities: No clubbing or cyanosis.   Assessment/Plan: 1. Chronic systolic CHF: Last echo with EF 30-35%.  End stage CHF with cardiorenal syndrome.  Hospice is following. She does not appear significantly volume overloaded today.  - Continue Toprol XL and current dose of torsemide.   - BMET today 2. Recurrent right pleural effusion: Has Pleurx catheter.  I will refer her to pulmonary for management.  3. CKD: As above, BMET today.  Cardiorenal component.  4. CAD: Stable with no chest pain.  Continue ASA 81, statin, Toprol XL.  5. Chronic atrial fibrillation: She has never been on coumadin.  I would not start this now that she is getting hospice care.   Marca Ancona 06/15/2013

## 2013-06-16 ENCOUNTER — Encounter: Payer: Self-pay | Admitting: Cardiology

## 2013-06-16 MED ORDER — POTASSIUM CHLORIDE ER 10 MEQ PO TBCR: 10.0000 meq | EXTENDED_RELEASE_TABLET | Freq: Every day | ORAL | Status: DC

## 2013-06-16 NOTE — Addendum Note (Signed)
Encounter addended by: Theresia Bough, CMA on: 06/16/2013 12:39 PM<BR>     Documentation filed: Orders

## 2013-06-19 ENCOUNTER — Encounter (HOSPITAL_COMMUNITY): Payer: Medicare Other

## 2013-06-27 DIAGNOSIS — I441 Atrioventricular block, second degree: Secondary | ICD-10-CM

## 2013-07-04 ENCOUNTER — Telehealth (HOSPITAL_COMMUNITY): Payer: Self-pay | Admitting: Cardiology

## 2013-07-04 NOTE — Telephone Encounter (Signed)
Rosey Bath called to inform office pts weight is up some however pt is eating better Cough is increased +SOB O2 Sat decreased while walking to bathroom to 88%, once she was placed back on O2 sat increased to 99% Lung sounds- wheezing (which is better than last week when they were diminished)  Can pt be placed on abx as prophylactic therapy? Can she have RX for neb solution?

## 2013-07-04 NOTE — Telephone Encounter (Signed)
Discussed w/Amy Filbert Schilder, NP and left detailed VM for Rosey Bath can increase torsemide to 20 mg bid every day, wear oxygen, no abx, have hospice md prescribe neb if needed

## 2013-07-08 ENCOUNTER — Emergency Department (HOSPITAL_COMMUNITY)

## 2013-07-08 ENCOUNTER — Other Ambulatory Visit: Payer: Self-pay

## 2013-07-08 ENCOUNTER — Inpatient Hospital Stay (HOSPITAL_COMMUNITY)

## 2013-07-08 ENCOUNTER — Encounter (HOSPITAL_COMMUNITY): Payer: Self-pay | Admitting: Emergency Medicine

## 2013-07-08 ENCOUNTER — Telehealth: Payer: Self-pay | Admitting: Physician Assistant

## 2013-07-08 ENCOUNTER — Inpatient Hospital Stay (HOSPITAL_COMMUNITY)
Admission: EM | Admit: 2013-07-08 | Discharge: 2013-07-10 | DRG: 291 | Disposition: A | Discharge status: Hospice | Attending: Internal Medicine | Admitting: Internal Medicine

## 2013-07-08 DIAGNOSIS — R042 Hemoptysis: Secondary | ICD-10-CM

## 2013-07-08 DIAGNOSIS — Z950 Presence of cardiac pacemaker: Secondary | ICD-10-CM

## 2013-07-08 DIAGNOSIS — Z79899 Other long term (current) drug therapy: Secondary | ICD-10-CM

## 2013-07-08 DIAGNOSIS — R0602 Shortness of breath: Secondary | ICD-10-CM

## 2013-07-08 DIAGNOSIS — E039 Hypothyroidism, unspecified: Secondary | ICD-10-CM | POA: Diagnosis present

## 2013-07-08 DIAGNOSIS — Z6829 Body mass index (BMI) 29.0-29.9, adult: Secondary | ICD-10-CM

## 2013-07-08 DIAGNOSIS — E872 Acidosis: Secondary | ICD-10-CM

## 2013-07-08 DIAGNOSIS — E871 Hypo-osmolality and hyponatremia: Secondary | ICD-10-CM

## 2013-07-08 DIAGNOSIS — N184 Chronic kidney disease, stage 4 (severe): Secondary | ICD-10-CM | POA: Diagnosis present

## 2013-07-08 DIAGNOSIS — J189 Pneumonia, unspecified organism: Secondary | ICD-10-CM

## 2013-07-08 DIAGNOSIS — J90 Pleural effusion, not elsewhere classified: Secondary | ICD-10-CM

## 2013-07-08 DIAGNOSIS — G8929 Other chronic pain: Secondary | ICD-10-CM | POA: Diagnosis present

## 2013-07-08 DIAGNOSIS — I252 Old myocardial infarction: Secondary | ICD-10-CM

## 2013-07-08 DIAGNOSIS — I5023 Acute on chronic systolic (congestive) heart failure: Secondary | ICD-10-CM

## 2013-07-08 DIAGNOSIS — E7800 Pure hypercholesterolemia, unspecified: Secondary | ICD-10-CM | POA: Diagnosis present

## 2013-07-08 DIAGNOSIS — I509 Heart failure, unspecified: Secondary | ICD-10-CM | POA: Diagnosis present

## 2013-07-08 DIAGNOSIS — J9601 Acute respiratory failure with hypoxia: Secondary | ICD-10-CM

## 2013-07-08 DIAGNOSIS — N179 Acute kidney failure, unspecified: Secondary | ICD-10-CM

## 2013-07-08 DIAGNOSIS — I5032 Chronic diastolic (congestive) heart failure: Principal | ICD-10-CM | POA: Diagnosis present

## 2013-07-08 DIAGNOSIS — I4891 Unspecified atrial fibrillation: Secondary | ICD-10-CM | POA: Diagnosis present

## 2013-07-08 DIAGNOSIS — Z9861 Coronary angioplasty status: Secondary | ICD-10-CM

## 2013-07-08 DIAGNOSIS — I2510 Atherosclerotic heart disease of native coronary artery without angina pectoris: Secondary | ICD-10-CM | POA: Diagnosis present

## 2013-07-08 DIAGNOSIS — I255 Ischemic cardiomyopathy: Secondary | ICD-10-CM

## 2013-07-08 DIAGNOSIS — I1310 Hypertensive heart and chronic kidney disease without heart failure, with stage 1 through stage 4 chronic kidney disease, or unspecified chronic kidney disease: Secondary | ICD-10-CM

## 2013-07-08 DIAGNOSIS — E119 Type 2 diabetes mellitus without complications: Secondary | ICD-10-CM | POA: Diagnosis present

## 2013-07-08 DIAGNOSIS — M129 Arthropathy, unspecified: Secondary | ICD-10-CM | POA: Diagnosis present

## 2013-07-08 DIAGNOSIS — I959 Hypotension, unspecified: Secondary | ICD-10-CM

## 2013-07-08 DIAGNOSIS — G9340 Encephalopathy, unspecified: Secondary | ICD-10-CM | POA: Diagnosis present

## 2013-07-08 DIAGNOSIS — J9600 Acute respiratory failure, unspecified whether with hypoxia or hypercapnia: Secondary | ICD-10-CM | POA: Diagnosis present

## 2013-07-08 DIAGNOSIS — E875 Hyperkalemia: Secondary | ICD-10-CM | POA: Diagnosis present

## 2013-07-08 DIAGNOSIS — Z96649 Presence of unspecified artificial hip joint: Secondary | ICD-10-CM

## 2013-07-08 DIAGNOSIS — E43 Unspecified severe protein-calorie malnutrition: Secondary | ICD-10-CM | POA: Diagnosis present

## 2013-07-08 DIAGNOSIS — I130 Hypertensive heart and chronic kidney disease with heart failure and stage 1 through stage 4 chronic kidney disease, or unspecified chronic kidney disease: Secondary | ICD-10-CM | POA: Diagnosis present

## 2013-07-08 DIAGNOSIS — E8770 Fluid overload, unspecified: Secondary | ICD-10-CM

## 2013-07-08 LAB — COMPREHENSIVE METABOLIC PANEL
ALT: 8 U/L (ref 0–35)
Alkaline Phosphatase: 118 U/L — ABNORMAL HIGH (ref 39–117)
BUN: 54 mg/dL — ABNORMAL HIGH (ref 6–23)
Chloride: 95 mEq/L — ABNORMAL LOW (ref 96–112)
GFR calc Af Amer: 34 mL/min — ABNORMAL LOW (ref >90)
GFR calc non Af Amer: 30 mL/min — ABNORMAL LOW (ref >90)
Glucose, Bld: 224 mg/dL — ABNORMAL HIGH (ref 70–99)
Potassium: 5.5 mEq/L — ABNORMAL HIGH (ref 3.5–5.1)
Sodium: 137 mEq/L (ref 135–145)
Total Bilirubin: 0.5 mg/dL (ref 0.3–1.2)

## 2013-07-08 LAB — BODY FLUID CELL COUNT WITH DIFFERENTIAL
Eos, Fluid: 8 %
Lymphs, Fluid: 56 %
Neutrophil Count, Fluid: 28 % — ABNORMAL HIGH (ref 0–25)
Total Nucleated Cell Count, Fluid: 594 cu mm (ref 0–1000)

## 2013-07-08 LAB — CBC
HCT: 40.5 % (ref 36.0–46.0)
Hemoglobin: 12.2 g/dL (ref 12.0–15.0)
RBC: 4.09 MIL/uL (ref 3.87–5.11)
WBC: 9.3 10*3/uL (ref 4.0–10.5)

## 2013-07-08 LAB — URINALYSIS, ROUTINE W REFLEX MICROSCOPIC
Bilirubin Urine: NEGATIVE
Glucose, UA: NEGATIVE mg/dL
Hgb urine dipstick: NEGATIVE
Protein, ur: NEGATIVE mg/dL
Specific Gravity, Urine: 1.017 (ref 1.005–1.030)
Urobilinogen, UA: 0.2 mg/dL (ref 0.0–1.0)

## 2013-07-08 LAB — AMYLASE, BODY FLUID: Amylase, Fluid: 16 U/L

## 2013-07-08 LAB — TROPONIN I: Troponin I: <0.3 ng/mL (ref <0.30)

## 2013-07-08 LAB — GLUCOSE, SEROUS FLUID: Glucose, Fluid: 200 mg/dL

## 2013-07-08 LAB — LACTATE DEHYDROGENASE, PLEURAL OR PERITONEAL FLUID: LD, Fluid: 50 U/L — ABNORMAL HIGH (ref 3–23)

## 2013-07-08 MED ORDER — SODIUM CHLORIDE 0.9 % IV SOLN: 250.0000 mL | INTRAVENOUS | Status: DC | PRN

## 2013-07-08 MED ORDER — POLYETHYLENE GLYCOL 3350 17 G PO PACK
17.0000 g | PACK | Freq: Every day | ORAL | Status: DC | PRN
  Filled 2013-07-08: qty 1

## 2013-07-08 MED ORDER — GLUCERNA SHAKE PO LIQD
237.0000 mL | Freq: Three times a day (TID) | ORAL | Status: DC
  Administered 2013-07-09 (×3): 237 mL via ORAL

## 2013-07-08 MED ORDER — ONDANSETRON HCL 4 MG/2ML IJ SOLN: 4.0000 mg | Freq: Four times a day (QID) | INTRAMUSCULAR | Status: DC | PRN

## 2013-07-08 MED ORDER — SODIUM POLYSTYRENE SULFONATE 15 GM/60ML PO SUSP
45.0000 g | Freq: Once | ORAL | Status: AC
  Administered 2013-07-08: 45 g via ORAL
  Filled 2013-07-08: qty 180

## 2013-07-08 MED ORDER — FLUTICASONE PROPIONATE 50 MCG/ACT NA SUSP
2.0000 | Freq: Two times a day (BID) | NASAL | Status: DC | PRN
  Filled 2013-07-08: qty 16

## 2013-07-08 MED ORDER — SODIUM CHLORIDE 0.9 % IJ SOLN: 3.0000 mL | INTRAMUSCULAR | Status: DC | PRN

## 2013-07-08 MED ORDER — LEVOTHYROXINE SODIUM 125 MCG PO TABS
125.0000 ug | ORAL_TABLET | Freq: Every day | ORAL | Status: DC
  Administered 2013-07-09 – 2013-07-10 (×2): 125 ug via ORAL
  Filled 2013-07-08 (×3): qty 1

## 2013-07-08 MED ORDER — LEVALBUTEROL TARTRATE 45 MCG/ACT IN AERO
1.0000 | INHALATION_SPRAY | Freq: Three times a day (TID) | RESPIRATORY_TRACT | Status: DC | PRN
Start: 2013-07-08 — End: 2013-07-10
  Filled 2013-07-08: qty 15

## 2013-07-08 MED ORDER — VITAMIN D (ERGOCALCIFEROL) 1.25 MG (50000 UNIT) PO CAPS
50000.0000 [IU] | ORAL_CAPSULE | ORAL | Status: DC
  Administered 2013-07-10: 11:00:00 50000 [IU] via ORAL
  Filled 2013-07-08: qty 1

## 2013-07-08 MED ORDER — ONDANSETRON HCL 4 MG PO TABS: 4.0000 mg | ORAL_TABLET | Freq: Four times a day (QID) | ORAL | Status: DC | PRN

## 2013-07-08 MED ORDER — ASPIRIN EC 81 MG PO TBEC
81.0000 mg | DELAYED_RELEASE_TABLET | Freq: Every day | ORAL | Status: DC
  Administered 2013-07-08 – 2013-07-09 (×2): 81 mg via ORAL
  Filled 2013-07-08 (×3): qty 1

## 2013-07-08 MED ORDER — ALPRAZOLAM 0.5 MG PO TABS
1.0000 mg | ORAL_TABLET | Freq: Every day | ORAL | Status: DC
  Administered 2013-07-08 – 2013-07-09 (×2): 1 mg via ORAL
  Filled 2013-07-08 (×2): qty 2

## 2013-07-08 MED ORDER — SODIUM CHLORIDE 0.9 % IJ SOLN
3.0000 mL | Freq: Two times a day (BID) | INTRAMUSCULAR | Status: DC
  Administered 2013-07-08 – 2013-07-10 (×4): 3 mL via INTRAVENOUS

## 2013-07-08 MED ORDER — HEPARIN SODIUM (PORCINE) 5000 UNIT/ML IJ SOLN
5000.0000 [IU] | Freq: Three times a day (TID) | INTRAMUSCULAR | Status: DC
  Administered 2013-07-08 – 2013-07-10 (×5): 5000 [IU] via SUBCUTANEOUS
  Filled 2013-07-08 (×8): qty 1

## 2013-07-08 MED ORDER — INSULIN ASPART 100 UNIT/ML ~~LOC~~ SOLN
10.0000 [IU] | Freq: Once | SUBCUTANEOUS | Status: AC
  Administered 2013-07-08: 10 [IU] via INTRAVENOUS
  Filled 2013-07-08: qty 1

## 2013-07-08 MED ORDER — ATORVASTATIN CALCIUM 40 MG PO TABS
40.0000 mg | ORAL_TABLET | Freq: Every day | ORAL | Status: DC
  Administered 2013-07-08 – 2013-07-09 (×2): 40 mg via ORAL
  Filled 2013-07-08 (×3): qty 1

## 2013-07-08 MED ORDER — POLYETHYLENE GLYCOL 3350 17 G PO PACK
17.0000 g | PACK | Freq: Every day | ORAL | Status: DC
  Administered 2013-07-10: 11:00:00 17 g via ORAL
  Filled 2013-07-08 (×2): qty 1

## 2013-07-08 MED ORDER — GLYBURIDE 1.25 MG PO TABS
1.2500 mg | ORAL_TABLET | Freq: Every day | ORAL | Status: DC | PRN
  Filled 2013-07-08: qty 1

## 2013-07-08 MED ORDER — PANTOPRAZOLE SODIUM 40 MG PO TBEC
40.0000 mg | DELAYED_RELEASE_TABLET | Freq: Every day | ORAL | Status: DC
  Administered 2013-07-09 – 2013-07-10 (×2): 40 mg via ORAL
  Filled 2013-07-08: qty 1

## 2013-07-08 MED ORDER — METOPROLOL SUCCINATE 12.5 MG HALF TABLET
12.5000 mg | ORAL_TABLET | Freq: Every day | ORAL | Status: DC
  Administered 2013-07-09 – 2013-07-10 (×2): 12.5 mg via ORAL
  Filled 2013-07-08 (×2): qty 1

## 2013-07-08 MED ORDER — ALLOPURINOL 100 MG PO TABS
100.0000 mg | ORAL_TABLET | Freq: Every morning | ORAL | Status: DC
  Administered 2013-07-09 – 2013-07-10 (×2): 100 mg via ORAL
  Filled 2013-07-08 (×2): qty 1

## 2013-07-08 MED ORDER — TORSEMIDE 20 MG PO TABS
20.0000 mg | ORAL_TABLET | Freq: Two times a day (BID) | ORAL | Status: DC
  Administered 2013-07-09 – 2013-07-10 (×2): 20 mg via ORAL
  Filled 2013-07-08 (×5): qty 1

## 2013-07-08 MED ORDER — DEXTROSE 50 % IV SOLN
1.0000 | Freq: Once | INTRAVENOUS | Status: AC
  Administered 2013-07-08: 50 mL via INTRAVENOUS
  Filled 2013-07-08: qty 50

## 2013-07-08 MED ORDER — OXYCODONE-ACETAMINOPHEN 5-325 MG PO TABS
1.0000 | ORAL_TABLET | Freq: Once | ORAL | Status: AC
  Administered 2013-07-08: 1 via ORAL
  Filled 2013-07-08: qty 1

## 2013-07-08 MED ORDER — OXYCODONE HCL 20 MG/ML PO CONC: 10.0000 mg | ORAL | Status: DC | PRN

## 2013-07-08 NOTE — ED Notes (Signed)
 drained from left chest.

## 2013-07-08 NOTE — ED Notes (Signed)
Personnel at bedside to perform US Thoracentesis.

## 2013-07-08 NOTE — H&P (Signed)
[facility] History and Physical  Miranda Cruz Miranda Cruz DOB: March 02, 1930 DOA: 07/08/2013  Referring physician: Dr. Karma Ganja PCP: Hoyle Sauer, MD  Specialists: Pulmonary  Chief Complaint: SOB  HPI: Miranda Cruz is a 79 y.o. female  Past medical history of systolic heart failure with an EF of 30-35%, right-sided plural  Catheter last time this was drain was a month ago on 2 L of oxygen nasal cannula, baseline creatinine of 1.8, history of atrial fibrillation not on any anticoagulation, followed by hospice at home, with multiple admissions for recurrent pleural effusions and heart failure exacerbation that comes in shortness of breath. As per the family he noticed that over the last week she has been calm more fatigued and short of breath. They have tried to drain the right catheter with no fluid. They relate there's no swelling of the lower extremities her abdomen her weight has been consistent and she is recently gotten confused. So they decided to bring her to the emergency room. He denied any fever chills nausea or vomiting the only medication that has been changes in her potassium.  In the ED: Chest x-ray was done that shows a new left pleural effusion, a basic metabolic panel shows a creatinine of 1.5 with a potassium of 5.7 a CBC within normal limits, UA that shows no signs of infection so we are consulted for further evaluation.  Review of Systems: The patient denies anorexia, fever, weight loss,, vision loss, decreased hearing, hoarseness,  syncope,  peripheral edema, balance deficits, hemoptysis, abdominal pain, melena, hematochezia, severe indigestion/heartburn, hematuria, incontinence, genital sores, muscle weakness, suspicious skin lesions, transient blindness, difficulty walking, depression, unusual weight change, abnormal bleeding, enlarged lymph nodes, angioedema, and breast masses.    Past Medical History  Diagnosis Date  . Myocardial infarction 1995  . CHF (congestive  heart failure)   . Pacemaker   . Hypertension   . High cholesterol   . Pleural effusion on right 04/27/2013    Miranda Cruz 04/27/2013 (04/27/2013)  . Chronic kidney disease (CKD), stage IV (severe)     Miranda Cruz 04/27/2013 (04/27/2013)  . Pneumonia 1948; 1954; 02/2013; ?04/27/2013    "real bad the first 2 times"; don't remember how bad in 02/2013; might have it now"  (04/27/2013)  . Exertional shortness of breath     "for the past week" (04/27/2013)  . Hypothyroidism   . Type II diabetes mellitus   . History of blood transfusion 2010    "w/hip replacement" (04/27/2013)  . Arthritis     "back; hands" (04/27/2013)  . Chronic lower back pain   . Gout     "several times; left ankle; fingers" (04/27/2013)   Past Surgical History  Procedure Laterality Date  . Coronary angioplasty  1995  . Coronary angioplasty with stent placement  2000    "?1" (04/27/2013)  . Abdominal wall mesh  removal  07/2007    "took the part out that was infected" (04/27/2013)  . Total hip arthroplasty Left 2009  . Total hip arthroplasty Right 2010  . Insert / replace / remove pacemaker  ~ 2011  . Hernia repair  06/2007    "umbilical hernia repair" (04/27/2013)  . Cataract extraction w/ intraocular lens implant Right 05/2012  . Joint replacement    . Thoracentesis Right 04/28/2013   Social History:  reports that she has never smoked. She has never used smokeless tobacco. She reports that she does not drink alcohol or use illicit drugs. Lives at home with family  Allergies  Allergen  Reactions  . Lisinopril     Informed by patient and family that medication causes kidney failure in patient and during last hospitalization was told by her cardiologist not to take this med.  . Morphine And Related Other (See Comments)    Made her loopy  . Phenergan [Promethazine Hcl] Other (See Comments)    Made her crazy  . Prednisone Other (See Comments)    Confusion, bad dreams, insomnia  . Trazodone And Nefazodone Other (See Comments)     hallucinations     Family History  Problem Relation Age of Onset  . Other Mother   . Other Father    Prior to Admission medications   Medication Sig Start Date End Date Taking? Authorizing Provider  acetaminophen (TYLENOL) 500 MG tablet Take 1,000 mg by mouth daily as needed for pain.   Yes Historical Provider, MD  allopurinol (ZYLOPRIM) 100 MG tablet Take 100 mg by mouth every morning.    Yes Historical Provider, MD  ALPRAZolam Prudy Feeler) 1 MG tablet Take 1 tablet (1 mg total) by mouth at bedtime. 06/08/13  Yes Meredeth Ide, MD  aspirin EC 81 MG tablet Take 81 mg by mouth at bedtime.   Yes Historical Provider, MD  atorvastatin (LIPITOR) 40 MG tablet Take 40 mg by mouth at bedtime.    Yes Historical Provider, MD  feeding supplement (GLUCERNA SHAKE) LIQD Take 237 mLs by mouth 3 (three) times daily between meals. 06/08/13  Yes Meredeth Ide, MD  fluticasone (FLONASE) 50 MCG/ACT nasal spray Place 2 sprays into the nose 2 (two) times daily as needed for rhinitis or allergies.   Yes Historical Provider, MD  glyBURIDE (DIABETA) 2.5 MG tablet Take 1.25 mg by mouth daily as needed (if sugar is over 120).   Yes Historical Provider, MD  guaiFENesin (MUCINEX) 600 MG 12 hr tablet Take 1 tablet (600 mg total) by mouth 2 (two) times daily. 02/11/13  Yes Ripudeep Jenna Luo, MD  levalbuterol [facility] HFA) 45 MCG/ACT inhaler Inhale 1-2 puffs into the lungs 3 (three) times daily as needed for wheezing or shortness of breath. 02/11/13  Yes Ripudeep Jenna Luo, MD  levothyroxine (SYNTHROID, LEVOTHROID) 125 MCG tablet Take 125 mcg by mouth daily before breakfast.    Yes Historical Provider, MD  Menthol, Topical Analgesic, (BENGAY EX) Apply 1 application topically daily as needed (hand pain).   Yes Historical Provider, MD  metoprolol succinate (TOPROL-XL) 25 MG 24 hr tablet Take 12.5 mg by mouth daily.   Yes Historical Provider, MD  nitroGLYCERIN (NITROSTAT) 0.4 MG SL tablet Place 0.4 mg under the tongue as needed for chest pain. x3  doses as needed for chest pain   Yes Historical Provider, MD  oxyCODONE (ROXICODONE INTENSOL) 100 MG/5ML concentrated solution Take 0.3 mLs (6 mg total) by mouth every 2 (two) hours as needed for pain. 06/08/13  Yes Meredeth Ide, MD  pantoprazole (PROTONIX) 40 MG tablet Take 40 mg by mouth daily.   Yes Historical Provider, MD  polyethylene glycol (MIRALAX / GLYCOLAX) packet Take 17 g by mouth daily. 06/08/13  Yes Meredeth Ide, MD  potassium chloride (K-DUR) 10 MEQ tablet Take 1 tablet (10 mEq total) by mouth daily. 06/16/13  Yes Laurey Morale, MD  sevelamer carbonate (RENVELA) 800 MG tablet Take 800 mg by mouth 2 (two) times daily. Breakfast and dinner   Yes Historical Provider, MD  torsemide (DEMADEX) 20 MG tablet Take 20 mg by mouth 2 (two) times daily. 06/08/13  Yes Sibyl Parr  Daune Perch, MD  Vitamin D, Ergocalciferol, (DRISDOL) 50000 UNITS CAPS Take 50,000 Units by mouth every Monday, Wednesday, and Friday. Approximately every other day   Yes Historical Provider, MD   Physical Exam: Filed Vitals:   07/08/13 1719  BP: 136/58  Pulse: 70  Temp: 99.5 F (37.5 C)  Resp: 16    BP 136/58  Pulse 70  Temp(Src) 99.5 F (37.5 C) (Rectal)  Resp 16  Ht 5\' 2"  (1.575 m)  Wt 75.932 kg (167 lb 6.4 oz)  BMI 30.61 kg/m2  SpO2 95%  General Appearance:    Alert at times during the interview, slow to respond appears stated age, cachectic   Head:    Normocephalic, without obvious abnormality, atraumatic           Throat:   Lips, mucosa, and tongue dry  Neck:   Supple, symmetrical, trachea midline, no adenopathy;    thyroid:  no  JVD  Back:     Symmetric, no curvature, ROM normal, no CVA tenderness  Lungs:     Clear to auscultation bilaterally, no appreciated sounds on the left with some minor crackles on the left upper lung       Heart:    Regular rate and rhythm, S1 and S2 normal, no murmur, rub   or gallop     Abdomen:     Soft, non-tender, bowel sounds active all four quadrants,    no masses, no  organomegaly        Extremities:   Extremities normal, atraumatic, no cyanosis or edema     Skin:   Skin color, texture, turgor normal, no rashes or lesions  Lymph nodes:   Cervical, supraclavicular, and axillary nodes normal  Neurologic:   CNII-XII intact, normal strength, sensation and reflexes    throughout     Labs on Admission:  Basic Metabolic Panel:  Recent Labs Lab 07/08/13 1558  NA 137  K 5.5*  CL 95*  CO2 36*  GLUCOSE 224*  BUN 54*  CREATININE 1.59*  CALCIUM 9.0   Liver Function Tests:  Recent Labs Lab 07/08/13 1558  AST 29  ALT 8  ALKPHOS 118*  BILITOT 0.5  PROT 6.8  ALBUMIN 2.8*   No results found for this basename: LIPASE, AMYLASE,  in the last 168 hours No results found for this basename: AMMONIA,  in the last 168 hours CBC:  Recent Labs Lab 07/08/13 1558  WBC 9.3  HGB 12.2  HCT 40.5  MCV 99.0  PLT 186   Cardiac Enzymes:  Recent Labs Lab 07/08/13 1558  TROPONINI <0.30    BNP (last 3 results)  Recent Labs  05/29/13 2056 05/30/13 1424 07/08/13 1558  PROBNP 9064.0* 8898.0* 7455.0*   CBG: No results found for this basename: GLUCAP,  in the last 168 hours  Radiological Exams on Admission: Dg Chest 2 View  07/08/2013   CLINICAL DATA:  Shortness of breath  EXAM: CHEST  2 VIEW  COMPARISON:  06/07/2013  FINDINGS: Large left pleural effusion is noted with obscuration of the underlying lung. Small right pleural effusion is noted. Right-sided pleural drain is in place. Obscuration of the right lower lobe is also noted. Heart size appears enlarged but is not optimally evaluated due to obscuration. No pneumothorax. Dual lead left-sided pacer in place.  IMPRESSION: Increased now large left and small right pleural effusions obscuring the underlying lung.   Electronically Signed   By: Christiana Pellant M.D.   On: 07/08/2013 17:11    EKG:  Independently reviewed. A. fib A. flutter with paced rhythm  Assessment/Plan Acute respiratory  failure/Pleural effusion, left - Consulted pulmonary critical care for left side thoracocentesis to drain this fluid. The most likely causes her heart failure. At this time she seems to be compensated we'll continue current home dose. Have explained to the family the risk and benefits of proceeding with this procedure and they seem to understand.  Encephalopathy acute: - This most likely due to her hypoxia, we have increased her oxygen to 4 L on aspirin family they relate this seems to be helping. She has no leukocytosis, there are no signs of fever in the hospital and the family relates no fevers at home. - There is only a mild derangement and her electrolytes, which I doubt is contributing to her confusion   CKD (chronic kidney disease) stage 4, GFR 15-29 ml/min - At baseline continue current meds.   Hyperkalemia: - Most likely due to the increase in her potassium supplements, the ED has given her insulin and glucose. At one dose of Kayexalate we'll check a basic metabolic panel in the morning.    Pleural effusion on right - Pleurx catheter in place.  Chronic systolic heart failure: - Currently compensated continue current meds.  Consulted PCCM  Code Status: partial code Family Communication: daughter Disposition Plan: inpatient  Time spent: 90 minutes  Marinda Elk [facility] Pager [phone]  If 7PM-7AM, please contact night-coverage [email or web address] Password [email or web address] 07/08/2013, 6:20 PM

## 2013-07-08 NOTE — Telephone Encounter (Signed)
Pt's daughter called in Saturday concerned about her mom. She has been gradually getting worse the last few days and was "going downhill" last night. Now she is lethargic and coughing up thick secretions similar to when she last requiring draining of Pleurx catheter. Hospice is involved, but the daughter does affirm that the patient and family are very willing to bring her to the hospital for further management and evaluation. They plan on bringing her up to the ER. Lazette Estala PA-C

## 2013-07-08 NOTE — ED Notes (Signed)
Patient presents with SOB which has been getting increasing worse over the last 3 days.  Has a Espera drain and the home care nurse and MD think that the tube may be blocked and that could be the reason why she is SOB

## 2013-07-08 NOTE — Consult Note (Signed)
PULMONARY  / CRITICAL CARE MEDICINE  Name: Miranda Cruz MRN: [id number] DOB: May 14, 1930    ADMISSION DATE:  07/08/2013 CONSULTATION DATE:  07/08/13  REFERRING MD :  Lambert Keto, MD PRIMARY SERVICE: [facility]  CHIEF COMPLAINT:  SOB  BRIEF PATIENT DESCRIPTION:  79 year old female with PMH relevant for CHF with LVEF 62f 30 to 35%, CAD, HTN, CKD and recurrent pleural effusions. She has a PleurX catheter on the right but now presents with a new large symptomatic left pleural effusion. Consult called to assist with thoracentesis.   SIGNIFICANT EVENTS / STUDIES:  Chest X ray with new large left pleural effusion.  LINES / TUBES: Peripheral IV's  CULTURES: No cultures  ANTIBIOTICS: No antibiotics  HISTORY OF PRESENT ILLNESS:   79 year old female with PMH relevant for CHF with LVEF 49f 30 to 35%, CAD, HTN, CKD and recurrent right pleural effusions s/p right PleurX catheter. She is on hospice. Presents with progressive SOB and orthopnea, only mild LE edema. Mild non productive cough. No fever, chills, sputum production. Chest X ray showed a new large left pleural effusion. Consult called to assist with thoracentesis. At the time of my exam the patient complains of significant SOB and orthopnea. She is hemodynamically stable and saturating 96% on 4 L [place]. She is on 2 L at baseline.   PAST MEDICAL HISTORY :  Past Medical History  Diagnosis Date  . Myocardial infarction 1995  . CHF (congestive heart failure)   . Pacemaker   . Hypertension   . High cholesterol   . Pleural effusion on right 04/27/2013    [name] 04/27/2013 (04/27/2013)  . Chronic kidney disease (CKD), stage IV (severe)     [name] 04/27/2013 (04/27/2013)  . Pneumonia 1948; 1954; 02/2013; ?04/27/2013    "real bad the first 2 times"; don't remember how bad in 02/2013; might have it now"  (04/27/2013)  . Exertional shortness of breath     "for the past week" (04/27/2013)  . Hypothyroidism   . Type II diabetes mellitus   . History  of blood transfusion 2010    "w/hip replacement" (04/27/2013)  . Arthritis     "back; hands" (04/27/2013)  . Chronic lower back pain   . Gout     "several times; left ankle; fingers" (04/27/2013)   Past Surgical History  Procedure Laterality Date  . Coronary angioplasty  1995  . Coronary angioplasty with stent placement  2000    "?1" (04/27/2013)  . Abdominal wall mesh  removal  07/2007    "took the part out that was infected" (04/27/2013)  . Total hip arthroplasty Left 2009  . Total hip arthroplasty Right 2010  . Insert / replace / remove pacemaker  ~ 2011  . Hernia repair  06/2007    "umbilical hernia repair" (04/27/2013)  . Cataract extraction w/ intraocular lens implant Right 05/2012  . Joint replacement    . Thoracentesis Right 04/28/2013   Prior to Admission medications   Medication Sig Start Date End Date Taking? Authorizing Provider  acetaminophen (TYLENOL) 500 MG tablet Take 1,000 mg by mouth daily as needed for pain.   Yes Historical Provider, MD  allopurinol (ZYLOPRIM) 100 MG tablet Take 100 mg by mouth every morning.    Yes Historical Provider, MD  ALPRAZolam Prudy Feeler) 1 MG tablet Take 1 tablet (1 mg total) by mouth at bedtime. 06/08/13  Yes Meredeth Ide, MD  aspirin EC 81 MG tablet Take 81 mg by mouth at bedtime.  Yes Historical Provider, MD  atorvastatin (LIPITOR) 40 MG tablet Take 40 mg by mouth at bedtime.    Yes Historical Provider, MD  feeding supplement (GLUCERNA SHAKE) LIQD Take 237 mLs by mouth 3 (three) times daily between meals. 06/08/13  Yes Meredeth Ide, MD  fluticasone (FLONASE) 50 MCG/ACT nasal spray Place 2 sprays into the nose 2 (two) times daily as needed for rhinitis or allergies.   Yes Historical Provider, MD  glyBURIDE (DIABETA) 2.5 MG tablet Take 1.25 mg by mouth daily as needed (if sugar is over 120).   Yes Historical Provider, MD  guaiFENesin (MUCINEX) 600 MG 12 hr tablet Take 1 tablet (600 mg total) by mouth 2 (two) times daily. 02/11/13  Yes Ripudeep Jenna Luo,  MD  levalbuterol [facility] HFA) 45 MCG/ACT inhaler Inhale 1-2 puffs into the lungs 3 (three) times daily as needed for wheezing or shortness of breath. 02/11/13  Yes Ripudeep Jenna Luo, MD  levothyroxine (SYNTHROID, LEVOTHROID) 125 MCG tablet Take 125 mcg by mouth daily before breakfast.    Yes Historical Provider, MD  Menthol, Topical Analgesic, (BENGAY EX) Apply 1 application topically daily as needed (hand pain).   Yes Historical Provider, MD  metoprolol succinate (TOPROL-XL) 25 MG 24 hr tablet Take 12.5 mg by mouth daily.   Yes Historical Provider, MD  nitroGLYCERIN (NITROSTAT) 0.4 MG SL tablet Place 0.4 mg under the tongue as needed for chest pain. x3 doses as needed for chest pain   Yes Historical Provider, MD  oxyCODONE (ROXICODONE INTENSOL) 100 MG/5ML concentrated solution Take 0.3 mLs (6 mg total) by mouth every 2 (two) hours as needed for pain. 06/08/13  Yes Meredeth Ide, MD  pantoprazole (PROTONIX) 40 MG tablet Take 40 mg by mouth daily.   Yes Historical Provider, MD  polyethylene glycol (MIRALAX / GLYCOLAX) packet Take 17 g by mouth daily. 06/08/13  Yes Meredeth Ide, MD  potassium chloride (K-DUR) 10 MEQ tablet Take 1 tablet (10 mEq total) by mouth daily. 06/16/13  Yes Laurey Morale, MD  sevelamer carbonate (RENVELA) 800 MG tablet Take 800 mg by mouth 2 (two) times daily. Breakfast and dinner   Yes Historical Provider, MD  torsemide (DEMADEX) 20 MG tablet Take 20 mg by mouth 2 (two) times daily. 06/08/13  Yes Meredeth Ide, MD  Vitamin D, Ergocalciferol, (DRISDOL) 50000 UNITS CAPS Take 50,000 Units by mouth every Monday, Wednesday, and Friday. Approximately every other day   Yes Historical Provider, MD   Allergies  Allergen Reactions  . Lisinopril     Informed by patient and family that medication causes kidney failure in patient and during last hospitalization was told by her cardiologist not to take this med.  . Morphine And Related Other (See Comments)    Made her loopy  . Phenergan  [Promethazine Hcl] Other (See Comments)    Made her crazy  . Prednisone Other (See Comments)    Confusion, bad dreams, insomnia  . Trazodone And Nefazodone Other (See Comments)    hallucinations     FAMILY HISTORY:  Family History  Problem Relation Age of Onset  . Other Mother   . Other Father    SOCIAL HISTORY:  reports that she has never smoked. She has never used smokeless tobacco. She reports that she does not drink alcohol or use illicit drugs.  REVIEW OF SYSTEMS:  All systems reviewed and found negative except for what I mentioned in the HPI.  SUBJECTIVE:   VITAL SIGNS: Temp:  [98.1 F (  36.7 C)-99.5 F (37.5 C)] 98.1 F (36.7 C) (11/01 1854) Pulse Rate:  [69-72] 71 (11/01 2000) Resp:  [14-22] 14 (11/01 2000) BP: (98-148)/(45-73) 98/45 mmHg (11/01 2000) SpO2:  [95 %-99 %] 96 % (11/01 2000) Weight:  [167 lb 6.4 oz (75.932 kg)] 167 lb 6.4 oz (75.932 kg) (11/01 1543) HEMODYNAMICS:   VENTILATOR SETTINGS:   INTAKE / OUTPUT: Intake/Output   None     PHYSICAL EXAMINATION: General: Pleasant female patient in mild respiratory distress. Eyes: Anicteric sclerae. ENT: Oropharynx clear. Moist mucous membranes. No thrush Lymph: No cervical, supraclavicular, or axillary lymphadenopathy. Heart: Normal S1, S2. No murmurs, rubs, or gallops appreciated. No bruits, equal pulses. Lungs: Diminished breath sounds to the left base. Right PleurX catheter in place. Normal upper airway sounds without evidence of stridor. Abdomen: Abdomen soft, non-tender and not distended, normoactive bowel sounds. No hepatosplenomegaly or masses. Musculoskeletal: No clubbing or synovitis. Mild 1+ LE edema. Skin: No rashes or lesions Neuro: No focal neurologic deficits.   LABS:  CBC Recent Labs     07/08/13  1558  WBC  9.3  HGB  12.2  HCT  40.5  PLT  186   Coag's No results found for this basename: APTT, INR,  in the last 72 hours BMET Recent Labs     07/08/13  1558  NA  137  K  5.5*   CL  95*  CO2  36*  BUN  54*  CREATININE  1.59*  GLUCOSE  224*   Electrolytes Recent Labs     07/08/13  1558  CALCIUM  9.0   Sepsis Markers No results found for this basename: LACTICACIDVEN, PROCALCITON, O2SATVEN,  in the last 72 hours ABG No results found for this basename: PHART, PCO2ART, PO2ART,  in the last 72 hours Liver Enzymes Recent Labs     07/08/13  1558  AST  29  ALT  8  ALKPHOS  118*  BILITOT  0.5  ALBUMIN  2.8*   Cardiac Enzymes Recent Labs     07/08/13  1558  TROPONINI  <0.30  PROBNP  7455.0*   Glucose No results found for this basename: GLUCAP,  in the last 72 hours  Imaging Dg Chest 2 View  07/08/2013   CLINICAL DATA:  Shortness of breath  EXAM: CHEST  2 VIEW  COMPARISON:  06/07/2013  FINDINGS: Large left pleural effusion is noted with obscuration of the underlying lung. Small right pleural effusion is noted. Right-sided pleural drain is in place. Obscuration of the right lower lobe is also noted. Heart size appears enlarged but is not optimally evaluated due to obscuration. No pneumothorax. Dual lead left-sided pacer in place.  IMPRESSION: Increased now large left and small right pleural effusions obscuring the underlying lung.   Electronically Signed   By: Christiana Pellant M.D.   On: 07/08/2013 17:11     ASSESSMENT: 79 year old female with PMH relevant for CHF with LVEF 65f 30 to 35%, CAD, HTN, CKD and recurrent pleural effusions. She has a PleurX catheter on the right but now presents with a new large symptomatic left pleural effusion. Consult called to assist with thoracentesis.    RECOMMENDATIONS: - Left thoracentesis performed with no immediate complications and significant symptomatic relief  - 2L of clear amber fluid removed and sent for micro, cell count, cytology, chemistry. - Chest X ray ordered   I have personally obtained a history, examined the patient, evaluated laboratory and imaging results, formulated the assessment and plan and  placed orders.  CRITICAL CARE: Critical Care Time devoted to patient care services described in this note is 45 minutes.   Overton Mam, MD Pulmonary and Critical Care Medicine [facility] Pager: [phone]  07/08/2013, 8:36 PM

## 2013-07-08 NOTE — ED Notes (Signed)
PT assigned new  room - [address] [place]

## 2013-07-08 NOTE — ED Provider Notes (Signed)
CSN: [id number]     Arrival date & time 07/08/13  1534 History   First MD Initiated Contact with Patient 07/08/13 1540     Chief Complaint  Patient presents with  . Shortness of Breath   (Consider location/radiation/quality/duration/timing/severity/associated sxs/prior Treatment) HPI Pt presenting with c/o shortness of breath and decreased energy level.  Pt has indwelling pleurex catheter on right side for chronic pleural effusion.  Last time this was drained was one month ago while in the hospital.  Family has noticed that over the past week patient has become more fatigued and short of breath.  Pt normally is on 2L [place] at home.  Home health nurse and daughter have both tried to drain her catheter without any fluid out.  No swelling of legs or abdomen.  She has maintained her weight consistently.  No fever/chills.  There are no other associated systemic symptoms, there are no other alleviating or modifying factors.   Past Medical History  Diagnosis Date  . Myocardial infarction 1995  . CHF (congestive heart failure)   . Pacemaker   . Hypertension   . High cholesterol   . Pleural effusion on right 04/27/2013    [name] 04/27/2013 (04/27/2013)  . Chronic kidney disease (CKD), stage IV (severe)     [name] 04/27/2013 (04/27/2013)  . Pneumonia 1948; 1954; 02/2013; ?04/27/2013    "real bad the first 2 times"; don't remember how bad in 02/2013; might have it now"  (04/27/2013)  . Exertional shortness of breath     "for the past week" (04/27/2013)  . Hypothyroidism   . Type II diabetes mellitus   . History of blood transfusion 2010    "w/hip replacement" (04/27/2013)  . Arthritis     "back; hands" (04/27/2013)  . Chronic lower back pain   . Gout     "several times; left ankle; fingers" (04/27/2013)   Past Surgical History  Procedure Laterality Date  . Coronary angioplasty  1995  . Coronary angioplasty with stent placement  2000    "?1" (04/27/2013)  . Abdominal wall mesh  removal  07/2007    "took  the part out that was infected" (04/27/2013)  . Total hip arthroplasty Left 2009  . Total hip arthroplasty Right 2010  . Insert / replace / remove pacemaker  ~ 2011  . Hernia repair  06/2007    "umbilical hernia repair" (04/27/2013)  . Cataract extraction w/ intraocular lens implant Right 05/2012  . Joint replacement    . Thoracentesis Right 04/28/2013   Family History  Problem Relation Age of Onset  . Other Mother   . Other Father    History  Substance Use Topics  . Smoking status: Never Smoker   . Smokeless tobacco: Never Used  . Alcohol Use: No   OB History   Grav Para Term Preterm Abortions TAB SAB Ect Mult Living                 Review of Systems ROS reviewed and all otherwise negative except for mentioned in HPI  Allergies  Lisinopril; Morphine and related; Phenergan; Prednisone; and Trazodone and nefazodone  Home Medications   Current Outpatient Rx  Name  Route  Sig  Dispense  Refill  . oxyCODONE (ROXICODONE INTENSOL) 100 MG/5ML concentrated solution   Oral   Take 0.8 mLs (16 mg total) by mouth every 2 (two) hours as needed.   50 mL   0    BP 125/48  Pulse 70  Temp(Src) 97.3 F (36.3 C) (  Oral)  Resp 18  Ht 5\' 2"  (1.575 m)  Wt 163 lb 9.3 oz (74.2 kg)  BMI 29.91 kg/m2  SpO2 100% Vitals reviewed Physical Exam Physical Examination: General appearance - alert, well appearing, and in no distress Mental status - alert, oriented to person, place, and time Eyes - no conjunctival injection, no scleral icterus Mouth - mucous membranes moist, pharynx normal without lesions Chest - decreased breath sounds bilaterally, BSS, no wheeze/rales/rhonchi Heart - normal rate, regular rhythm, normal S1, S2, no murmurs, rubs, clicks or gallops Abdomen - soft, nontender, nondistended, no masses or organomegaly, nontender Extremities - peripheral pulses normal, no pedal edema, no clubbing or cyanosis Skin - normal coloration and turgor, no rashes  ED Course  Procedures  (including critical care time)  5:32 PM family and patient updated about findings and plan.  D/w Dr. Tyron Russell radiology- he will pass along message to IR but they are not available for thoracentesis in the evening- the procedure will be done tomorrow morning.  Contacting hospitalist for admission.  Process has been gradually worsening over the past week, so I believe it is reasonable to admit her overnight and have procedure in the AM.    5:51 PM d/w hospitalist and they will admit the patient  CRITICAL CARE Performed by: Ethelda Chick Total critical care time: 30 Critical care time was exclusive of separately billable procedures and treating other patients. Critical care was necessary to treat or prevent imminent or life-threatening deterioration. Critical care was time spent personally by me on the following activities: development of treatment plan with patient and/or surrogate as well as nursing, discussions with consultants, evaluation of patient's response to treatment, examination of patient, obtaining history from patient or surrogate, ordering and performing treatments and interventions, ordering and review of laboratory studies, ordering and review of radiographic studies, pulse oximetry and re-evaluation of patient's condition. Labs Review Labs Reviewed  COMPREHENSIVE METABOLIC PANEL - Abnormal; Notable for the following:    Potassium 5.5 (*)    Chloride 95 (*)    CO2 36 (*)    Glucose, Bld 224 (*)    BUN 54 (*)    Creatinine, Ser 1.59 (*)    Albumin 2.8 (*)    Alkaline Phosphatase 118 (*)    GFR calc non Af Amer 30 (*)    GFR calc Af Amer 34 (*)    All other components within normal limits  PRO B NATRIURETIC PEPTIDE - Abnormal; Notable for the following:    Pro B Natriuretic peptide (BNP) 7455.0 (*)    All other components within normal limits  BODY FLUID CELL COUNT WITH DIFFERENTIAL - Abnormal; Notable for the following:    Neutrophil Count, Fluid 28 (*)     Monocyte-Macrophage-Serous Fluid 8 (*)    All other components within normal limits  LACTATE DEHYDROGENASE, BODY FLUID - Abnormal; Notable for the following:    LD, Fluid 50 (*)    All other components within normal limits  COMPREHENSIVE METABOLIC PANEL - Abnormal; Notable for the following:    CO2 36 (*)    Glucose, Bld 118 (*)    BUN 55 (*)    Creatinine, Ser 1.52 (*)    Albumin 2.3 (*)    GFR calc non Af Amer 31 (*)    GFR calc Af Amer 36 (*)    All other components within normal limits  BODY FLUID CULTURE  CBC  TROPONIN I  URINALYSIS, ROUTINE W REFLEX MICROSCOPIC  GLUCOSE, SEROUS FLUID  PROTEIN, BODY  FLUID  AMYLASE, BODY FLUID  CBC  TRIGLYCERIDES, BODY FLUIDS  CHOLESTEROL, BODY FLUID  PH, BODY FLUID  CYTOLOGY - NON PAP   Imaging Review Dg Chest 2 View  07/08/2013   CLINICAL DATA:  Shortness of breath  EXAM: CHEST  2 VIEW  COMPARISON:  06/07/2013  FINDINGS: Large left pleural effusion is noted with obscuration of the underlying lung. Small right pleural effusion is noted. Right-sided pleural drain is in place. Obscuration of the right lower lobe is also noted. Heart size appears enlarged but is not optimally evaluated due to obscuration. No pneumothorax. Dual lead left-sided pacer in place.  IMPRESSION: Increased now large left and small right pleural effusions obscuring the underlying lung.   Electronically Signed   By: Christiana Pellant M.D.   On: 07/08/2013 17:11   Dg Chest Port 1 View  07/08/2013   CLINICAL DATA:  Status post left thoracentesis  EXAM: PORTABLE CHEST - 1 VIEW  COMPARISON:  Film from earlier in the same day.  FINDINGS: There is been near-complete resolution of the patient's left-sided pleural effusion following thoracentesis. No pneumothorax is noted. Minimal left basilar atelectasis is noted. The cardiac shadow is stable. A pacing device is again seen. Stable right-sided pleural effusion. A pleural drainage catheter is noted on the right.  IMPRESSION:  Near-complete resolution of a left-sided pleural effusion following thoracentesis. No pneumothorax is noted.  Persistent small right-sided effusion.   Electronically Signed   By: Alcide Clever M.D.   On: 07/08/2013 20:38    EKG Interpretation     Ventricular Rate:  70 PR Interval:    QRS Duration: 150 QT Interval:  405 QTC Calculation: 437 R Axis:   -36 Text Interpretation:  Afib/flutter and ventricular-paced rhythm No further analysis attempted due to paced rhythm No significant change since last tracing of 06/01/13            MDM   1. Acute respiratory failure   2. CKD (chronic kidney disease) stage 4, GFR 15-29 ml/min   3. DM (diabetes mellitus)   4. Encephalopathy acute   5. Pleural effusion, left   6. Hyperkalemia   7. Pleural effusion on right   8. ARF (acute renal failure)   9. Metabolic acidosis   10. Volume overload   11. Cardiomyopathy, ischemic   12. CAD (coronary artery disease)   13. Hypotension   14. CAP (community acquired pneumonia)   15. Hemoptysis   16. Pleural effusion - right   17. Acute respiratory failure with hypoxia   18. Acute on chronic systolic CHF (congestive heart failure)   19. Cardiac pacemaker in situ   20. Hyposmolality and/or hyponatremia   21. Cardiorenal syndrome with renal failure    Pt presenting with c/o shortness of breath, increased fatigue over the past week.  Pt on CXR is found to have large left sided pleural effusion, some right sided fluid as well- pt has pleurex catheter in place on right side for chronic effusions.  Pt also has some renal insufficiency which is at her baseline, also some mild hyperkalemia- treated with insulin/glucose and kayexalate.  See above notes- d/w radiology and plan for IR drainage of effusion- pt in the ED is hemodynamically stable and will be admitted to hospitalist service.      Ethelda Chick, MD 07/09/13 [phone]

## 2013-07-08 NOTE — Procedures (Signed)
Thoracentesis Procedure Note  Pre-operative Diagnosis: Large symptomatic left pleural effusion.  Post-operative Diagnosis: same  Indications: Therapeutic thoracentesis.  Procedure Details  Consent: Informed consent was obtained. Risks of the procedure were discussed including: infection, bleeding, pain, pneumothorax.  Under sterile conditions the patient was positioned. The fluid pocket on the left hemithorax was identified with ultrasound.  Betadine solution and sterile drapes were utilized.  1% buffered lidocaine was used to anesthetize the rib space. Fluid was obtained without any difficulties and minimal blood loss.  A dressing was applied to the wound and wound care instructions were provided.   Findings 2000 ml of clear pleural fluid was obtained. A sample was sent to Pathology for cytogenetics, flow, and cell counts, as well as for infection analysis.  Complications:  None; patient tolerated the procedure well.          Condition: stable  Plan A follow up chest x-ray was ordered. Bed Rest for 1 hours. Tylenol 650 mg. for pain.  Attending Attestation: I was present and scrubbed for the entire procedure.  Overton Mam, MD [facility]

## 2013-07-09 DIAGNOSIS — I509 Heart failure, unspecified: Secondary | ICD-10-CM

## 2013-07-09 DIAGNOSIS — I5022 Chronic systolic (congestive) heart failure: Secondary | ICD-10-CM

## 2013-07-09 DIAGNOSIS — N179 Acute kidney failure, unspecified: Secondary | ICD-10-CM

## 2013-07-09 DIAGNOSIS — I5023 Acute on chronic systolic (congestive) heart failure: Secondary | ICD-10-CM

## 2013-07-09 DIAGNOSIS — J9600 Acute respiratory failure, unspecified whether with hypoxia or hypercapnia: Secondary | ICD-10-CM

## 2013-07-09 DIAGNOSIS — I2589 Other forms of chronic ischemic heart disease: Secondary | ICD-10-CM

## 2013-07-09 DIAGNOSIS — N184 Chronic kidney disease, stage 4 (severe): Secondary | ICD-10-CM

## 2013-07-09 DIAGNOSIS — J90 Pleural effusion, not elsewhere classified: Secondary | ICD-10-CM

## 2013-07-09 DIAGNOSIS — I2510 Atherosclerotic heart disease of native coronary artery without angina pectoris: Secondary | ICD-10-CM

## 2013-07-09 LAB — COMPREHENSIVE METABOLIC PANEL
ALT: 9 U/L (ref 0–35)
BUN: 55 mg/dL — ABNORMAL HIGH (ref 6–23)
CO2: 36 mEq/L — ABNORMAL HIGH (ref 19–32)
Calcium: 8.9 mg/dL (ref 8.4–10.5)
Chloride: 99 mEq/L (ref 96–112)
Creatinine, Ser: 1.52 mg/dL — ABNORMAL HIGH (ref 0.50–1.10)
GFR calc Af Amer: 36 mL/min — ABNORMAL LOW (ref >90)
GFR calc non Af Amer: 31 mL/min — ABNORMAL LOW (ref >90)
Glucose, Bld: 118 mg/dL — ABNORMAL HIGH (ref 70–99)
Potassium: 5.1 mEq/L (ref 3.5–5.1)
Sodium: 142 mEq/L (ref 135–145)
Total Bilirubin: 0.6 mg/dL (ref 0.3–1.2)

## 2013-07-09 LAB — CBC
HCT: 40.5 % (ref 36.0–46.0)
MCH: 29.6 pg (ref 26.0–34.0)
MCV: 97.4 fL (ref 78.0–100.0)
Platelets: 153 10*3/uL (ref 150–400)
RBC: 4.16 MIL/uL (ref 3.87–5.11)
RDW: 15.5 % (ref 11.5–15.5)

## 2013-07-09 LAB — CHOLESTEROL, BODY FLUID: Cholesterol, Fluid: 42 mg/dL

## 2013-07-09 LAB — PH, BODY FLUID: pH, Fluid: 8.5

## 2013-07-09 LAB — TRIGLYCERIDES, BODY FLUIDS: Triglycerides, Fluid: 26 mg/dL

## 2013-07-09 MED ORDER — OXYCODONE HCL 5 MG/5ML PO SOLN
15.0000 mg | ORAL | Status: DC | PRN
  Administered 2013-07-09 – 2013-07-10 (×3): 15 mg via ORAL
  Filled 2013-07-09 (×3): qty 15

## 2013-07-09 MED ORDER — OXYCODONE HCL 20 MG/ML PO CONC: 15.0000 mg | ORAL | Status: DC | PRN

## 2013-07-09 MED ORDER — SODIUM POLYSTYRENE SULFONATE 15 GM/60ML PO SUSP
45.0000 g | Freq: Once | ORAL | Status: AC
  Administered 2013-07-09: 45 g via ORAL
  Filled 2013-07-09: qty 180

## 2013-07-09 NOTE — Progress Notes (Signed)
[facility] PROGRESS NOTE Interim History: 79 y.o. female  Past medical history of systolic heart failure with an EF of 30-35%, right-sided plural Catheter last time this was drain was a month ago on 2 L of oxygen nasal cannula, baseline creatinine of 1.8, history of atrial fibrillation not on any anticoagulation, followed by hospice at home, with multiple admissions for recurrent pleural effusions and heart failure exacerbation that comes in shortness of breath. As per the family he noticed that over the last week she has been calm more fatigued and short of breath. They have tried to drain the right catheter with no fluid. Chest x-ray was done that shows a new left pleural effusion  Assessment/Plan: Acute respiratory failure due to  *Pleural effusion, left: - Status post thoracentesis yielding 2 L of fluid sent for cytology. - The patient is having much better on room air.  Pleural effusion on right - Stable continue current management at home.   Hyperkalemia - Resolved with Kayexalate to the basic metabolic more morning. - Will need a be met in 1 week after discharge. We'll decrease the dose of potassium supplements.  Encephalopathy acute: - This probably due to her hypoxia is now resolved.  CKD (chronic kidney disease) stage 4, GFR 15-29 ml/min - At baseline continue current treatment.     Code Status: full Family Communication: daughter  Disposition Plan: inpatient   Consultants:  PCCM  Procedures:  Thoracocentesis  Antibiotics:  None  HPI/Subjective: Feels better had a good night sleep.  Objective: Filed Vitals:   07/08/13 2000 07/08/13 2119 07/09/13 0200 07/09/13 0629  BP: 98/45 105/38 101/35 125/48  Pulse: 71 70 70 70  Temp:  97.9 F (36.6 C) 97.3 F (36.3 C) 97.3 F (36.3 C)  TempSrc:  Oral Oral Oral  Resp: 14 16 17 18   Height:  5\' 2"  (1.575 m)    Weight:  74.4 kg (164 lb 0.4 oz)  74.2 kg (163 lb 9.3 oz)  SpO2: 96% 100% 99% 100%   No intake  or output data in the 24 hours ending 07/09/13 0808 Filed Weights   07/08/13 1543 07/08/13 2119 07/09/13 0629  Weight: 75.932 kg (167 lb 6.4 oz) 74.4 kg (164 lb 0.4 oz) 74.2 kg (163 lb 9.3 oz)    Exam:  General: Alert, awake, oriented x3, in no acute distress.  HEENT: No bruits, no goiter.  Heart: Regular rate and rhythm, without murmurs, rubs, gallops.  Lungs: Good air movement, bilateral air movement.  Abdomen: Soft, nontender, nondistended, positive bowel sounds.     Data Reviewed: Basic Metabolic Panel:  Recent Labs Lab 07/08/13 1558 07/09/13 0616  NA 137 142  K 5.5* 5.1  CL 95* 99  CO2 36* 36*  GLUCOSE 224* 118*  BUN 54* 55*  CREATININE 1.59* 1.52*  CALCIUM 9.0 8.9   Liver Function Tests:  Recent Labs Lab 07/08/13 1558 07/09/13 0616  AST 29 36  ALT 8 9  ALKPHOS 118* 101  BILITOT 0.5 0.6  PROT 6.8 6.0  ALBUMIN 2.8* 2.3*   No results found for this basename: LIPASE, AMYLASE,  in the last 168 hours No results found for this basename: AMMONIA,  in the last 168 hours CBC:  Recent Labs Lab 07/08/13 1558 07/09/13 0616  WBC 9.3 6.5  HGB 12.2 12.3  HCT 40.5 40.5  MCV 99.0 97.4  PLT 186 153   Cardiac Enzymes:  Recent Labs Lab 07/08/13 1558  TROPONINI <0.30   BNP (last 3 results)  Recent Labs  05/29/13 2056 05/30/13 1424 07/08/13 1558  PROBNP 9064.0* 8898.0* 7455.0*   CBG: No results found for this basename: GLUCAP,  in the last 168 hours  No results found for this or any previous visit (from the past 240 hour(s)).   Studies: Dg Chest 2 View  07/08/2013   CLINICAL DATA:  Shortness of breath  EXAM: CHEST  2 VIEW  COMPARISON:  06/07/2013  FINDINGS: Large left pleural effusion is noted with obscuration of the underlying lung. Small right pleural effusion is noted. Right-sided pleural drain is in place. Obscuration of the right lower lobe is also noted. Heart size appears enlarged but is not optimally evaluated due to obscuration. No  pneumothorax. Dual lead left-sided pacer in place.  IMPRESSION: Increased now large left and small right pleural effusions obscuring the underlying lung.   Electronically Signed   By: Christiana Pellant M.D.   On: 07/08/2013 17:11   Dg Chest Port 1 View  07/08/2013   CLINICAL DATA:  Status post left thoracentesis  EXAM: PORTABLE CHEST - 1 VIEW  COMPARISON:  Film from earlier in the same day.  FINDINGS: There is been near-complete resolution of the patient's left-sided pleural effusion following thoracentesis. No pneumothorax is noted. Minimal left basilar atelectasis is noted. The cardiac shadow is stable. A pacing device is again seen. Stable right-sided pleural effusion. A pleural drainage catheter is noted on the right.  IMPRESSION: Near-complete resolution of a left-sided pleural effusion following thoracentesis. No pneumothorax is noted.  Persistent small right-sided effusion.   Electronically Signed   By: Alcide Clever M.D.   On: 07/08/2013 20:38    Scheduled Meds: . allopurinol  100 mg Oral q morning - 10a  . ALPRAZolam  1 mg Oral QHS  . aspirin EC  81 mg Oral QHS  . atorvastatin  40 mg Oral QHS  . feeding supplement (GLUCERNA SHAKE)  237 mL Oral TID BM  . heparin  5,000 Units Subcutaneous Q8H  . levothyroxine  125 mcg Oral QAC breakfast  . metoprolol succinate  12.5 mg Oral Daily  . pantoprazole  40 mg Oral Daily  . polyethylene glycol  17 g Oral Daily  . sodium chloride  3 mL Intravenous Q12H  . torsemide  20 mg Oral BID  . [START ON 07/10/2013] Vitamin D (Ergocalciferol)  50,000 Units Oral Q M,W,F   Continuous Infusions:    Marinda Elk  [facility] Pager [phone]. If 8PM-8AM, please contact night-coverage at [email or web address], password [facility] 07/09/2013, 8:08 AM  LOS: 1 day

## 2013-07-09 NOTE — Progress Notes (Signed)
Patient admitted with left pleural effusion, status post thoracentesis.  Patient is a partial code.  Found patient resting in her room, FLACC score 0.  No family present.  Reviewed chart and patient's pulmonary status has been stable on room air following thoracentesis.  Anticipate discharge home with continued Hospice services.  Encourage a call to Hospice at [phone] with any needs or discharge disposition.  April Costella Hatcher, RN, BSN Hospice

## 2013-07-09 NOTE — Progress Notes (Signed)
Admitted pt to rm 4E04 from ED via stretcher, oriented to room, call bell placed within reach, orders carried out, admission assessment done.

## 2013-07-09 NOTE — Progress Notes (Signed)
PULMONARY  / CRITICAL CARE MEDICINE  Name: Miranda Cruz MRN: [id number] DOB: 1929/12/08    ADMISSION DATE:  07/08/2013 CONSULTATION DATE:  07/08/13  REFERRING MD :  Lambert Keto, MD PRIMARY SERVICE: [facility]  CHIEF COMPLAINT:  SOB  BRIEF PATIENT DESCRIPTION:  79 year old female with PMH relevant for CHF with LVEF 4f 30 to 35%, CAD, HTN, CKD and recurrent pleural effusions. She has a PleurX catheter on the right but now presents with a new large symptomatic left pleural effusion. Consult called to assist with thoracentesis.   SIGNIFICANT EVENTS / STUDIES:  Chest X ray with new large left pleural effusion.  LINES / TUBES: Peripheral IV's  CULTURES: No cultures  ANTIBIOTICS: No antibiotics  HISTORY OF PRESENT ILLNESS:   79 year old female with PMH relevant for Chr AFib, ICM/CHF with LVEF 30f 30 to 35%, CAD, HTN, CKD and recurrent right pleural effusions s/p right PleurX catheter (IR). She is on hospice home care ([facility]/ [facility]). Presents with progressive SOB and orthopnea, only mild LE edema. Mild non productive cough. No fever, chills, sputum production. Chest X ray showed a new large left pleural effusion. Consult called to assist with thoracentesis. At the time of my exam the patient complains of significant SOB and orthopnea. She is hemodynamically stable and saturating 96% on 4 L [place]. She is on 2 L at baseline.   PAST MEDICAL HISTORY :  Past Medical History  Diagnosis Date  . Myocardial infarction 1995  . CHF (congestive heart failure)   . Pacemaker   . Hypertension   . High cholesterol   . Pleural effusion on right 04/27/2013    [name] 04/27/2013 (04/27/2013)  . Chronic kidney disease (CKD), stage IV (severe)     [name] 04/27/2013 (04/27/2013)  . Pneumonia 1948; 1954; 02/2013; ?04/27/2013    "real bad the first 2 times"; don't remember how bad in 02/2013; might have it now"  (04/27/2013)  . Exertional shortness of breath     "for the past week" (04/27/2013)  .  Hypothyroidism   . Type II diabetes mellitus   . History of blood transfusion 2010    "w/hip replacement" (04/27/2013)  . Arthritis     "back; hands" (04/27/2013)  . Chronic lower back pain   . Gout     "several times; left ankle; fingers" (04/27/2013)   Past Surgical History  Procedure Laterality Date  . Coronary angioplasty  1995  . Coronary angioplasty with stent placement  2000    "?1" (04/27/2013)  . Abdominal wall mesh  removal  07/2007    "took the part out that was infected" (04/27/2013)  . Total hip arthroplasty Left 2009  . Total hip arthroplasty Right 2010  . Insert / replace / remove pacemaker  ~ 2011  . Hernia repair  06/2007    "umbilical hernia repair" (04/27/2013)  . Cataract extraction w/ intraocular lens implant Right 05/2012  . Joint replacement    . Thoracentesis Right 04/28/2013   Prior to Admission medications   Medication Sig Start Date End Date Taking? Authorizing Provider  acetaminophen (TYLENOL) 500 MG tablet Take 1,000 mg by mouth daily as needed for pain.   Yes Historical Provider, MD  allopurinol (ZYLOPRIM) 100 MG tablet Take 100 mg by mouth every morning.    Yes Historical Provider, MD  ALPRAZolam Prudy Feeler) 1 MG tablet Take 1 tablet (1 mg total) by mouth at bedtime. 06/08/13  Yes Meredeth Ide, MD  aspirin EC 81 MG tablet Take 81  mg by mouth at bedtime.   Yes Historical Provider, MD  atorvastatin (LIPITOR) 40 MG tablet Take 40 mg by mouth at bedtime.    Yes Historical Provider, MD  feeding supplement (GLUCERNA SHAKE) LIQD Take 237 mLs by mouth 3 (three) times daily between meals. 06/08/13  Yes Meredeth Ide, MD  fluticasone (FLONASE) 50 MCG/ACT nasal spray Place 2 sprays into the nose 2 (two) times daily as needed for rhinitis or allergies.   Yes Historical Provider, MD  glyBURIDE (DIABETA) 2.5 MG tablet Take 1.25 mg by mouth daily as needed (if sugar is over 120).   Yes Historical Provider, MD  guaiFENesin (MUCINEX) 600 MG 12 hr tablet Take 1 tablet (600 mg total)  by mouth 2 (two) times daily. 02/11/13  Yes Ripudeep Jenna Luo, MD  levalbuterol [facility] HFA) 45 MCG/ACT inhaler Inhale 1-2 puffs into the lungs 3 (three) times daily as needed for wheezing or shortness of breath. 02/11/13  Yes Ripudeep Jenna Luo, MD  levothyroxine (SYNTHROID, LEVOTHROID) 125 MCG tablet Take 125 mcg by mouth daily before breakfast.    Yes Historical Provider, MD  Menthol, Topical Analgesic, (BENGAY EX) Apply 1 application topically daily as needed (hand pain).   Yes Historical Provider, MD  metoprolol succinate (TOPROL-XL) 25 MG 24 hr tablet Take 12.5 mg by mouth daily.   Yes Historical Provider, MD  nitroGLYCERIN (NITROSTAT) 0.4 MG SL tablet Place 0.4 mg under the tongue as needed for chest pain. x3 doses as needed for chest pain   Yes Historical Provider, MD  oxyCODONE (ROXICODONE INTENSOL) 100 MG/5ML concentrated solution Take 0.3 mLs (6 mg total) by mouth every 2 (two) hours as needed for pain. 06/08/13  Yes Meredeth Ide, MD  pantoprazole (PROTONIX) 40 MG tablet Take 40 mg by mouth daily.   Yes Historical Provider, MD  polyethylene glycol (MIRALAX / GLYCOLAX) packet Take 17 g by mouth daily. 06/08/13  Yes Meredeth Ide, MD  potassium chloride (K-DUR) 10 MEQ tablet Take 1 tablet (10 mEq total) by mouth daily. 06/16/13  Yes Laurey Morale, MD  sevelamer carbonate (RENVELA) 800 MG tablet Take 800 mg by mouth 2 (two) times daily. Breakfast and dinner   Yes Historical Provider, MD  torsemide (DEMADEX) 20 MG tablet Take 20 mg by mouth 2 (two) times daily. 06/08/13  Yes Meredeth Ide, MD  Vitamin D, Ergocalciferol, (DRISDOL) 50000 UNITS CAPS Take 50,000 Units by mouth every Monday, Wednesday, and Friday. Approximately every other day   Yes Historical Provider, MD   Allergies  Allergen Reactions  . Lisinopril     Informed by patient and family that medication causes kidney failure in patient and during last hospitalization was told by her cardiologist not to take this med.  . Morphine And Related  Other (See Comments)    Made her loopy  . Phenergan [Promethazine Hcl] Other (See Comments)    Made her crazy  . Prednisone Other (See Comments)    Confusion, bad dreams, insomnia  . Trazodone And Nefazodone Other (See Comments)    hallucinations     SUBJECTIVE:   VITAL SIGNS: Temp:  [97.3 F (36.3 C)-99.5 F (37.5 C)] 97.3 F (36.3 C) (11/02 0629) Pulse Rate:  [69-72] 70 (11/02 0629) Resp:  [14-22] 18 (11/02 0629) BP: (98-148)/(35-73) 125/48 mmHg (11/02 0629) SpO2:  [95 %-100 %] 100 % (11/02 0629) Weight:  [74.2 kg (163 lb 9.3 oz)-75.932 kg (167 lb 6.4 oz)] 74.2 kg (163 lb 9.3 oz) (11/02 0629) HEMODYNAMICS:  VENTILATOR SETTINGS:   INTAKE / OUTPUT: Intake/Output     11/01 0701 - 11/02 0700 11/02 0701 - 11/03 0700        Urine Occurrence 1 x    Stool Occurrence 3 x      PHYSICAL EXAMINATION: General: Pleasant female patient in mild respiratory distress. Eyes: Anicteric sclerae. ENT: Oropharynx clear. Moist mucous membranes. No thrush Lymph: No cervical, supraclavicular, or axillary lymphadenopathy. Heart: Normal S1, S2. No murmurs, rubs, or gallops appreciated. No bruits, equal pulses. Lungs: Diminished breath sounds to the left base. Right PleurX catheter in place. Normal upper airway sounds without evidence of stridor. Abdomen: Abdomen soft, non-tender and not distended, normoactive bowel sounds. No hepatosplenomegaly or masses. Musculoskeletal: No clubbing or synovitis. Mild 1+ LE edema. Skin: No rashes or lesions Neuro: No focal neurologic deficits.  LABS:  CBC Recent Labs     07/08/13  1558  07/09/13  0616  WBC  9.3  6.5  HGB  12.2  12.3  HCT  40.5  40.5  PLT  186  153   Coag's No results found for this basename: APTT, INR,  in the last 72 hours BMET Recent Labs     07/08/13  1558  07/09/13  0616  NA  137  142  K  5.5*  5.1  CL  95*  99  CO2  36*  36*  BUN  54*  55*  CREATININE  1.59*  1.52*  GLUCOSE  224*  118*   Electrolytes Recent Labs      07/08/13  1558  07/09/13  0616  CALCIUM  9.0  8.9   Liver Enzymes Recent Labs     07/08/13  1558  07/09/13  0616  AST  29  36  ALT  8  9  ALKPHOS  118*  101  BILITOT  0.5  0.6  ALBUMIN  2.8*  2.3*   Cardiac Enzymes Recent Labs     07/08/13  1558  TROPONINI  <0.30  PROBNP  7455.0*   PLEURAL FLUID 11/1 >> transudative w/ TProt 2.8, LDH 50...  Imaging Dg Chest 2 View  07/08/2013   CLINICAL DATA:  Shortness of breath  EXAM: CHEST  2 VIEW  COMPARISON:  06/07/2013  FINDINGS: Large left pleural effusion is noted with obscuration of the underlying lung. Small right pleural effusion is noted. Right-sided pleural drain is in place. Obscuration of the right lower lobe is also noted. Heart size appears enlarged but is not optimally evaluated due to obscuration. No pneumothorax. Dual lead left-sided pacer in place.  IMPRESSION: Increased now large left and small right pleural effusions obscuring the underlying lung.   Electronically Signed   By: Christiana Pellant M.D.   On: 07/08/2013 17:11   Dg Chest Port 1 View  07/08/2013   CLINICAL DATA:  Status post left thoracentesis  EXAM: PORTABLE CHEST - 1 VIEW  COMPARISON:  Film from earlier in the same day.  FINDINGS: There is been near-complete resolution of the patient's left-sided pleural effusion following thoracentesis. No pneumothorax is noted. Minimal left basilar atelectasis is noted. The cardiac shadow is stable. A pacing device is again seen. Stable right-sided pleural effusion. A pleural drainage catheter is noted on the right.  IMPRESSION: Near-complete resolution of a left-sided pleural effusion following thoracentesis. No pneumothorax is noted.  Persistent small right-sided effusion.   Electronically Signed   By: Alcide Clever M.D.   On: 07/08/2013 20:38     ASSESSMENT: 79 year old female with  PMH relevant for CHF with LVEF 90f 30 to 35%, CAD, HTN, CKD and recurrent pleural effusions. She has a PleurX catheter on the right but now  presents with a new large symptomatic left pleural effusion. Consult called to assist with thoracentesis => drained 2L clear yellow fluid, transudative... 11/2 breathing much improved, CHF Rx & adjust in diuretics per [facility]/ [facility]    RECOMMENDATIONS: - Left thoracentesis performed with no immediate complications and significant symptomatic relief  - 2L of clear amber fluid removed and sent for micro, cell count, cytology, chemistry=> transudate - Chest X ray post thor much improved, will follow...    Lonzo Cloud. Kriste Basque, MD Pulmonary and Critical Care Medicine [facility] Pager: [phone] 07/09/2013, 8:19 AM

## 2013-07-09 NOTE — Discharge Summary (Addendum)
Physician Discharge Summary  Miranda Cruz ZOX:[id number] DOB: 03-04-1930 DOA: 07/08/2013  PCP: Hoyle Sauer, MD  Admit date: 07/08/2013 Discharge date: 07/10/2013  Time spent: 31 minutes  Recommendations for Outpatient Follow-up:  1. Follow up with PCP in 1 week. B-met monitor K. 2. Follow up with cardiology 1-2 week. Monitor electrolytes. 3. Follow with hospice at home. 4. Follow up with pulmonary as an outpatient.  Discharge Diagnoses:  Principal Problem:   Pleural effusion, left Active Problems:   CKD (chronic kidney disease) stage 4, GFR 15-29 ml/min   Encephalopathy acute   Hyperkalemia   Acute respiratory failure   Pleural effusion on right   Severe protein-calorie malnutrition   Discharge Condition: stable  Diet recommendation: heart healthy diet  Filed Weights   07/08/13 2119 07/09/13 0629 07/10/13 0621  Weight: 74.4 kg (164 lb 0.4 oz) 74.2 kg (163 lb 9.3 oz) 77.1 kg (169 lb 15.6 oz)    History of present illness:  79 y.o. female  Past medical history of systolic heart failure with an EF of 30-35%, right-sided plural Catheter last time this was drain was a month ago on 2 L of oxygen nasal cannula, baseline creatinine of 1.8, history of atrial fibrillation not on any anticoagulation, followed by hospice at home, with multiple admissions for recurrent pleural effusions and heart failure exacerbation that comes in shortness of breath. As per the family he noticed that over the last week she has been calm more fatigued and short of breath. They have tried to drain the right catheter with no fluid. They relate there's no swelling of the lower extremities her abdomen her weight has been consistent and she is recently gotten confused. So they decided to bring her to the emergency room. He denied any fever chills nausea or vomiting the only medication that has been changes in her potassium.   Hospital Course:  Acute respiratory failure due to *Pleural effusion, left:  -  Status post thoracentesis yielding 2 L of fluid sent for cytology.  - The patient is having much better on room air.  - repeated CXR showed no complications. - follow up with pulmonary as an outpatient.  Pleural effusion on right  - Stable continue current management at home.   Chronic diastolic heart failure:  - Repeated CXR showed mild interstitial edema.  - cont on torsemide dose, had mild JVD  Was given extra dose of torsemide.  - strict I and os'.  - Follow up with Dr. Caro Hight this week as an outpatient  Hyperkalemia  - Resolved with Kayexalate to the basic metabolic more morning.  - Will need a be met in 1 week after discharge. We'll decrease the dose of potassium supplements.  - repeat b-met in  1 week.  Encephalopathy acute:  - This probably due to her hypoxia is now resolved.   CKD (chronic kidney disease) stage 4, GFR 15-29 ml/min  - At baseline continue current treatment.   Procedures:  Thoracocentesis on 07/08/2013  CXR  Consultations:  PCCM  Discharge Exam: Filed Vitals:   07/10/13 0621  BP: 124/56  Pulse: 70  Temp: 98.3 F (36.8 C)  Resp: 18    General: A&O x3 Cardiovascular: RRR Respiratory: good air movement CTA B/L  Discharge Instructions      Discharge Orders   Future Appointments Provider Department Dept Phone   07/12/2013 9:40 AM [facility] [phone]   07/19/2013 11:15 AM Donato Schultz, MD [facility] [address]  [phone]   07/20/2013 10:00 AM Kalman Shan, MD [facility] [phone]   Future Orders Complete By Expires   Diet - low sodium heart healthy  As directed    Increase activity slowly  As directed        Medication List    STOP taking these medications       potassium chloride 10 MEQ tablet  Commonly known as:  K-DUR      TAKE these medications       acetaminophen 500 MG tablet  Commonly known as:  TYLENOL  Take 1,000 mg by mouth  daily as needed for pain.     allopurinol 100 MG tablet  Commonly known as:  ZYLOPRIM  Take 100 mg by mouth every morning.     ALPRAZolam 1 MG tablet  Commonly known as:  XANAX  Take 1 tablet (1 mg total) by mouth at bedtime.     aspirin EC 81 MG tablet  Take 81 mg by mouth at bedtime.     atorvastatin 40 MG tablet  Commonly known as:  LIPITOR  Take 40 mg by mouth at bedtime.     BENGAY EX  Apply 1 application topically daily as needed (hand pain).     feeding supplement (GLUCERNA SHAKE) Liqd  Take 237 mLs by mouth 3 (three) times daily between meals.     fluticasone 50 MCG/ACT nasal spray  Commonly known as:  FLONASE  Place 2 sprays into the nose 2 (two) times daily as needed for rhinitis or allergies.     glyBURIDE 2.5 MG tablet  Commonly known as:  DIABETA  Take 1.25 mg by mouth daily as needed (if sugar is over 120).     guaiFENesin 600 MG 12 hr tablet  Commonly known as:  MUCINEX  Take 1 tablet (600 mg total) by mouth 2 (two) times daily.     levalbuterol 45 MCG/ACT inhaler  Commonly known as:  XOPENEX HFA  Inhale 1-2 puffs into the lungs 3 (three) times daily as needed for wheezing or shortness of breath.     levothyroxine 125 MCG tablet  Commonly known as:  SYNTHROID, LEVOTHROID  Take 125 mcg by mouth daily before breakfast.     metoprolol succinate 25 MG 24 hr tablet  Commonly known as:  TOPROL-XL  Take 12.5 mg by mouth daily.     nitroGLYCERIN 0.4 MG SL tablet  Commonly known as:  NITROSTAT  Place 0.4 mg under the tongue as needed for chest pain. x3 doses as needed for chest pain     oxyCODONE 100 MG/5ML concentrated solution  Commonly known as:  ROXICODONE INTENSOL  Take 0.8 mLs (16 mg total) by mouth every 2 (two) hours as needed.     pantoprazole 40 MG tablet  Commonly known as:  PROTONIX  Take 40 mg by mouth daily.     polyethylene glycol packet  Commonly known as:  MIRALAX / GLYCOLAX  Take 17 g by mouth daily.     sevelamer carbonate 800 MG  tablet  Commonly known as:  RENVELA  Take 800 mg by mouth 2 (two) times daily. Breakfast and dinner     torsemide 20 MG tablet  Commonly known as:  DEMADEX  Take 20 mg by mouth 2 (two) times daily.     Vitamin D (Ergocalciferol) 50000 UNITS Caps capsule  Commonly known as:  DRISDOL  Take 50,000 Units by mouth every Monday, Wednesday, and Friday. Approximately every other day  Allergies  Allergen Reactions  . Lisinopril     Informed by patient and family that medication causes kidney failure in patient and during last hospitalization was told by her cardiologist not to take this med.  . Morphine And Related Other (See Comments)    Made her loopy  . Phenergan [Promethazine Hcl] Other (See Comments)    Made her crazy  . Prednisone Other (See Comments)    Confusion, bad dreams, insomnia  . Trazodone And Nefazodone Other (See Comments)    hallucinations    Follow-up Information   Follow up with Hoyle Sauer, MD In 1 week. (hospital follow up)    Specialty:  Internal Medicine   Contact information:   [address] [facility] [place] [address] [phone]       Follow up with Donato Schultz, MD In 1 week. (hospital follow up)    Specialty:  Cardiology   Contact information:   [address] [place] [address] [phone]       Follow up with NADEL,SCOTT M, MD In 2 weeks. (hospital follow up)    Specialty:  Pulmonary Disease   Contact information:   [address] [place] [address] [phone]       Follow up with Hoyle Sauer, MD In 1 week. (hospital follow up)    Specialty:  Internal Medicine   Contact information:   [address] [facility] [place] [address] [phone]        The results of significant diagnostics from this hospitalization (including imaging, microbiology, ancillary and laboratory) are listed below for reference.    Significant Diagnostic  Studies: Dg Chest 2 View  07/08/2013   CLINICAL DATA:  Shortness of breath  EXAM: CHEST  2 VIEW  COMPARISON:  06/07/2013  FINDINGS: Large left pleural effusion is noted with obscuration of the underlying lung. Small right pleural effusion is noted. Right-sided pleural drain is in place. Obscuration of the right lower lobe is also noted. Heart size appears enlarged but is not optimally evaluated due to obscuration. No pneumothorax. Dual lead left-sided pacer in place.  IMPRESSION: Increased now large left and small right pleural effusions obscuring the underlying lung.   Electronically Signed   By: Christiana Pellant M.D.   On: 07/08/2013 17:11   Dg Chest Port 1 View  07/08/2013   CLINICAL DATA:  Status post left thoracentesis  EXAM: PORTABLE CHEST - 1 VIEW  COMPARISON:  Film from earlier in the same day.  FINDINGS: There is been near-complete resolution of the patient's left-sided pleural effusion following thoracentesis. No pneumothorax is noted. Minimal left basilar atelectasis is noted. The cardiac shadow is stable. A pacing device is again seen. Stable right-sided pleural effusion. A pleural drainage catheter is noted on the right.  IMPRESSION: Near-complete resolution of a left-sided pleural effusion following thoracentesis. No pneumothorax is noted.  Persistent small right-sided effusion.   Electronically Signed   By: Alcide Clever M.D.   On: 07/08/2013 20:38    Microbiology: Recent Results (from the past 240 hour(s))  BODY FLUID CULTURE     Status: None   Collection Time    07/08/13  8:15 PM      Result Value Range Status   Specimen Description THORACENTESIS LEFT FLUID   Final   Special Requests NONE   Final   Gram Stain PENDING   Incomplete   Culture     Final   Value: NO GROWTH 1 DAY  Performed at [facility]   Report Status PENDING   Incomplete     Labs: Basic Metabolic Panel:  Recent Labs Lab 07/08/13 1558 07/09/13 0616 07/10/13 0730  NA 137 142 134*  K 5.5* 5.1 4.5   CL 95* 99 92*  CO2 36* 36* 31  GLUCOSE 224* 118* 152*  BUN 54* 55* 66*  CREATININE 1.59* 1.52* 1.67*  CALCIUM 9.0 8.9 8.5   Liver Function Tests:  Recent Labs Lab 07/08/13 1558 07/09/13 0616  AST 29 36  ALT 8 9  ALKPHOS 118* 101  BILITOT 0.5 0.6  PROT 6.8 6.0  ALBUMIN 2.8* 2.3*   No results found for this basename: LIPASE, AMYLASE,  in the last 168 hours No results found for this basename: AMMONIA,  in the last 168 hours CBC:  Recent Labs Lab 07/08/13 1558 07/09/13 0616  WBC 9.3 6.5  HGB 12.2 12.3  HCT 40.5 40.5  MCV 99.0 97.4  PLT 186 153   Cardiac Enzymes:  Recent Labs Lab 07/08/13 1558  TROPONINI <0.30   BNP: BNP (last 3 results)  Recent Labs  05/29/13 2056 05/30/13 1424 07/08/13 1558  PROBNP 9064.0* 8898.0* 7455.0*   CBG: No results found for this basename: GLUCAP,  in the last 168 hours     Signed:  Marinda Elk  [facility] 07/10/2013, 2:54 PM

## 2013-07-10 ENCOUNTER — Inpatient Hospital Stay (HOSPITAL_COMMUNITY)

## 2013-07-10 DIAGNOSIS — E43 Unspecified severe protein-calorie malnutrition: Secondary | ICD-10-CM | POA: Diagnosis present

## 2013-07-10 DIAGNOSIS — N179 Acute kidney failure, unspecified: Secondary | ICD-10-CM

## 2013-07-10 LAB — BASIC METABOLIC PANEL
CO2: 31 mEq/L (ref 19–32)
Calcium: 8.5 mg/dL (ref 8.4–10.5)
Chloride: 92 mEq/L — ABNORMAL LOW (ref 96–112)
Creatinine, Ser: 1.67 mg/dL — ABNORMAL HIGH (ref 0.50–1.10)
Potassium: 4.5 mEq/L (ref 3.5–5.1)
Sodium: 134 mEq/L — ABNORMAL LOW (ref 135–145)

## 2013-07-10 MED ORDER — TORSEMIDE 20 MG PO TABS
20.0000 mg | ORAL_TABLET | Freq: Once | ORAL | Status: AC
  Administered 2013-07-10: 20 mg via ORAL
  Filled 2013-07-10: qty 1

## 2013-07-10 NOTE — Progress Notes (Signed)
Room 4E04 - Helyn Numbers - [facility] RN Visit-R.Loreda Silverio RN  Related admission to [facility] diagnosis of CHF.  Pt is partial code - meds only, no CPR,  DNI code.    Pt alert & oriented, sitting up in lounge chair, without complaints of pain or discomfort.    No family present.  Patient's home medication list is on shadow chart.  Pt also seen by [facility] SW Billie during visit.   Pt admitted following porgressive SOB,  Pleurix catheter in R/lung and drained periodically.  Pt developed new, large pleural effusion on LEFT.   PCCM Dr. Tyson Alias present at visit - pt to be discharged today with follow up at the end of the week with PCCM - will have chest xray and determine recurrence of L/sided pleural effusion and make a decision of Pleurix insertion on the LEFT also.  Pt HOH - re stated plan and pt acknowledged her understanding.  Please call [facility] @ [phone]- ask for RN Liaison or after hours,ask for on-call RN with any hospice needs.   Thank you.  Joneen Boers, RN  [facility]  Hospice Liaison  [phone])

## 2013-07-10 NOTE — Progress Notes (Signed)
[facility] PROGRESS NOTE Interim History: 79 y.o. female  Past medical history of systolic heart failure with an EF of 30-35%, right-sided plural Catheter last time this was drain was a month ago on 2 L of oxygen nasal cannula, baseline creatinine of 1.8, history of atrial fibrillation not on any anticoagulation, followed by hospice at home, with multiple admissions for recurrent pleural effusions and heart failure exacerbation that comes in shortness of breath. As per the family he noticed that over the last week she has been calm more fatigued and short of breath. They have tried to drain the right catheter with no fluid. Chest x-ray was done that shows a new left pleural effusion  Assessment/Plan: Acute respiratory failure due to  *Pleural effusion, left: - Status post thoracentesis yielding 2 L of fluid sent for cytology. Transudate. - The patient is having much better on room air. - follow up with pulmonary as an outpatient.  Chronic diastolic heart failure: - cont torsemide dose, has mild JVD give extra dose of torsemide. - strict I and os'. Repeated CXR showed mild interstitial edema. - b-met pending. - Follow up with Dr. Caro Hight this week as an outpatient.  Pleural effusion on right - Stable continue current management at home.   Hyperkalemia - Resolved with Kayexalate to the basic metabolic more morning. - We'll decrease the dose of potassium supplements.  Encephalopathy acute: - This probably due to her hypoxia is now resolved.  CKD (chronic kidney disease) stage 4, GFR 15-29 ml/min - At baseline continue current treatment.    Code Status: full Family Communication: daughter  Disposition Plan: inpatient   Consultants:  PCCM  Procedures:  Thoracocentesis  Antibiotics:  None  HPI/Subjective: Feels better had a good night sleep. She wants to go home.  Objective: Filed Vitals:   07/09/13 0629 07/09/13 1300 07/09/13 2056 07/10/13 0621  BP: 125/48  120/42 93/51 124/56  Pulse: 70 70 70 70  Temp: 97.3 F (36.3 C) 98.2 F (36.8 C) 98.1 F (36.7 C) 98.3 F (36.8 C)  TempSrc: Oral Oral Oral Oral  Resp: 18 18 17 18   Height:      Weight: 74.2 kg (163 lb 9.3 oz)   77.1 kg (169 lb 15.6 oz)  SpO2: 100% 94% 94% 96%    Intake/Output Summary (Last 24 hours) at 07/10/13 0721 Last data filed at 07/09/13 2340  Gross per 24 hour  Intake    720 ml  Output      0 ml  Net    720 ml   Filed Weights   07/08/13 2119 07/09/13 0629 07/10/13 0621  Weight: 74.4 kg (164 lb 0.4 oz) 74.2 kg (163 lb 9.3 oz) 77.1 kg (169 lb 15.6 oz)    Exam:  General: Alert, awake, oriented x3, in no acute distress.  HEENT: No bruits, no goiter. + HJ reflex. Heart: Regular rate and rhythm, without murmurs, rubs, gallops.  Lungs: Good air movement, bilateral air movement.  Abdomen: Soft, nontender, nondistended, positive bowel sounds.     Data Reviewed: Basic Metabolic Panel:  Recent Labs Lab 07/08/13 1558 07/09/13 0616  NA 137 142  K 5.5* 5.1  CL 95* 99  CO2 36* 36*  GLUCOSE 224* 118*  BUN 54* 55*  CREATININE 1.59* 1.52*  CALCIUM 9.0 8.9   Liver Function Tests:  Recent Labs Lab 07/08/13 1558 07/09/13 0616  AST 29 36  ALT 8 9  ALKPHOS 118* 101  BILITOT 0.5 0.6  PROT 6.8 6.0  ALBUMIN 2.8* 2.3*  No results found for this basename: LIPASE, AMYLASE,  in the last 168 hours No results found for this basename: AMMONIA,  in the last 168 hours CBC:  Recent Labs Lab 07/08/13 1558 07/09/13 0616  WBC 9.3 6.5  HGB 12.2 12.3  HCT 40.5 40.5  MCV 99.0 97.4  PLT 186 153   Cardiac Enzymes:  Recent Labs Lab 07/08/13 1558  TROPONINI <0.30   BNP (last 3 results)  Recent Labs  05/29/13 2056 05/30/13 1424 07/08/13 1558  PROBNP 9064.0* 8898.0* 7455.0*   CBG: No results found for this basename: GLUCAP,  in the last 168 hours  No results found for this or any previous visit (from the past 240 hour(s)).   Studies: Dg Chest 2  View  07/10/2013   CLINICAL DATA:  Pleural effusion.  Shortness of breath and cough.  EXAM: CHEST  2 VIEW  COMPARISON:  07/08/2013  FINDINGS: Dual lead left-sided pacemaker unchanged. The lungs are somewhat hypoinflated with mild worsening the hazy bilateral perihilar opacification likely interstitial edema. There are worsening small to moderate bilateral pleural effusions. The cardiomediastinal silhouette and remainder of the exam is unchanged.  IMPRESSION: Worsening mild in bilateral hazy perihilar opacification likely interstitial edema, although cannot completely exclude infection. Worsening small to moderate bilateral pleural effusions.   Electronically Signed   By: Elberta Fortis M.D.   On: 07/10/2013 07:07   Dg Chest 2 View  07/08/2013   CLINICAL DATA:  Shortness of breath  EXAM: CHEST  2 VIEW  COMPARISON:  06/07/2013  FINDINGS: Large left pleural effusion is noted with obscuration of the underlying lung. Small right pleural effusion is noted. Right-sided pleural drain is in place. Obscuration of the right lower lobe is also noted. Heart size appears enlarged but is not optimally evaluated due to obscuration. No pneumothorax. Dual lead left-sided pacer in place.  IMPRESSION: Increased now large left and small right pleural effusions obscuring the underlying lung.   Electronically Signed   By: Christiana Pellant M.D.   On: 07/08/2013 17:11   Dg Chest Port 1 View  07/08/2013   CLINICAL DATA:  Status post left thoracentesis  EXAM: PORTABLE CHEST - 1 VIEW  COMPARISON:  Film from earlier in the same day.  FINDINGS: There is been near-complete resolution of the patient's left-sided pleural effusion following thoracentesis. No pneumothorax is noted. Minimal left basilar atelectasis is noted. The cardiac shadow is stable. A pacing device is again seen. Stable right-sided pleural effusion. A pleural drainage catheter is noted on the right.  IMPRESSION: Near-complete resolution of a left-sided pleural effusion  following thoracentesis. No pneumothorax is noted.  Persistent small right-sided effusion.   Electronically Signed   By: Alcide Clever M.D.   On: 07/08/2013 20:38    Scheduled Meds: . allopurinol  100 mg Oral q morning - 10a  . ALPRAZolam  1 mg Oral QHS  . aspirin EC  81 mg Oral QHS  . atorvastatin  40 mg Oral QHS  . feeding supplement (GLUCERNA SHAKE)  237 mL Oral TID BM  . heparin  5,000 Units Subcutaneous Q8H  . levothyroxine  125 mcg Oral QAC breakfast  . metoprolol succinate  12.5 mg Oral Daily  . pantoprazole  40 mg Oral Daily  . polyethylene glycol  17 g Oral Daily  . sodium chloride  3 mL Intravenous Q12H  . torsemide  20 mg Oral BID  . torsemide  20 mg Oral Once  . Vitamin D (Ergocalciferol)  50,000 Units Oral Q M,W,F  Continuous Infusions:    Marinda Elk  [facility] Pager [phone]. If 8PM-8AM, please contact night-coverage at [email or web address], password [facility] 07/10/2013, 7:21 AM  LOS: 2 days

## 2013-07-10 NOTE — Progress Notes (Signed)
[facility]   Homecare SW visit  Related Admission to Hospice diagnosis. Patient is in bed, alert. No family present. Patient stated she thought she was going home yesterday and hoped that she would go home today. Pts. [name] came at end of visit. SW spoke with Hospice RN who stated discharge orders have been written for today. Please call [facility] with any Hospice related questions or concerns [phone]. Vonna Kotyk, LCSW

## 2013-07-10 NOTE — Progress Notes (Signed)
PULMONARY  / CRITICAL CARE MEDICINE  Name: Miranda Cruz MRN: [id number] DOB: Apr 26, 1930    ADMISSION DATE:  07/08/2013 CONSULTATION DATE:  07/08/13  REFERRING MD :  Lambert Keto, MD PRIMARY SERVICE: [facility]  CHIEF COMPLAINT:  SOB  BRIEF PATIENT DESCRIPTION:  79 year old female with PMH relevant for CHF with LVEF 60f 30 to 35%, CAD, HTN, CKD and recurrent pleural effusions. She has a PleurX catheter on the right but now presents with a new large symptomatic left pleural effusion. Consult called to assist with thoracentesis.   SIGNIFICANT EVENTS / STUDIES:  Chest X ray with new large left pleural effusion. 11-1 left thora 2000 cc LINES / TUBES: Peripheral IV's Rt pleuryx cath>> CULTURES: No cultures  ANTIBIOTICS: No antibiotics  SUBJECTIVE:  NAD, sitting in chair, HOH  VITAL SIGNS: Temp:  [98.1 F (36.7 C)-98.3 F (36.8 C)] 98.3 F (36.8 C) (11/03 1324) Pulse Rate:  [70] 70 (11/03 0621) Resp:  [17-18] 18 (11/03 0621) BP: (93-124)/(42-56) 124/56 mmHg (11/03 0621) SpO2:  [94 %-96 %] 96 % (11/03 0621) Weight:  [169 lb 15.6 oz (77.1 kg)] 169 lb 15.6 oz (77.1 kg) (11/03 0621) HEMODYNAMICS:   VENTILATOR SETTINGS:   INTAKE / OUTPUT: Intake/Output     11/02 0701 - 11/03 0700 11/03 0701 - 11/04 0700   P.O. 720 240   Total Intake(mL/kg) 720 (9.3) 240 (3.1)   Net +720 +240        Urine Occurrence 400 x 1 x     PHYSICAL EXAMINATION: General: Pleasant female patient no distress Lymph: No cervical, supraclavicular, or axillary lymphadenopathy. Heart: Normal S1, S2. No murmurs, rubs, or gallops appreciated. No bruits, equal pulses. Lungs: Diminished breath sounds to the left base. Right PleurX catheter in place. Normal upper airway sounds without evidence of stridor. Abdomen: Abdomen soft, non-tender and not distended, normoactive bowel sounds. Musculoskeletal: No clubbing or synovitis. Mild 1+ LE edema. Skin: No rashes or lesions Neuro: No focal neurologic  deficits  LABS:  CBC Recent Labs     07/08/13  1558  07/09/13  0616  WBC  9.3  6.5  HGB  12.2  12.3  HCT  40.5  40.5  PLT  186  153   Coag's No results found for this basename: APTT, INR,  in the last 72 hours BMET Recent Labs     07/08/13  1558  07/09/13  0616  07/10/13  0730  NA  137  142  134*  K  5.5*  5.1  4.5  CL  95*  99  92*  CO2  36*  36*  31  BUN  54*  55*  66*  CREATININE  1.59*  1.52*  1.67*  GLUCOSE  224*  118*  152*   Electrolytes Recent Labs     07/08/13  1558  07/09/13  0616  07/10/13  0730  CALCIUM  9.0  8.9  8.5   Liver Enzymes Recent Labs     07/08/13  1558  07/09/13  0616  AST  29  36  ALT  8  9  ALKPHOS  118*  101  BILITOT  0.5  0.6  ALBUMIN  2.8*  2.3*   Cardiac Enzymes Recent Labs     07/08/13  1558  TROPONINI  <0.30  PROBNP  7455.0*   PLEURAL FLUID 11/1 >> transudative w/ TProt 2.8, LDH 50...  Imaging Dg Chest 2 View  07/10/2013   CLINICAL DATA:  Pleural effusion.  Shortness of breath  and cough.  EXAM: CHEST  2 VIEW  COMPARISON:  07/08/2013  FINDINGS: Dual lead left-sided pacemaker unchanged. The lungs are somewhat hypoinflated with mild worsening the hazy bilateral perihilar opacification likely interstitial edema. There are worsening small to moderate bilateral pleural effusions. The cardiomediastinal silhouette and remainder of the exam is unchanged.  IMPRESSION: Worsening mild in bilateral hazy perihilar opacification likely interstitial edema, although cannot completely exclude infection. Worsening small to moderate bilateral pleural effusions.   Electronically Signed   By: Elberta Fortis M.D.   On: 07/10/2013 07:07   Dg Chest 2 View  07/08/2013   CLINICAL DATA:  Shortness of breath  EXAM: CHEST  2 VIEW  COMPARISON:  06/07/2013  FINDINGS: Large left pleural effusion is noted with obscuration of the underlying lung. Small right pleural effusion is noted. Right-sided pleural drain is in place. Obscuration of the right lower lobe  is also noted. Heart size appears enlarged but is not optimally evaluated due to obscuration. No pneumothorax. Dual lead left-sided pacer in place.  IMPRESSION: Increased now large left and small right pleural effusions obscuring the underlying lung.   Electronically Signed   By: Christiana Pellant M.D.   On: 07/08/2013 17:11   Dg Chest Port 1 View  07/08/2013   CLINICAL DATA:  Status post left thoracentesis  EXAM: PORTABLE CHEST - 1 VIEW  COMPARISON:  Film from earlier in the same day.  FINDINGS: There is been near-complete resolution of the patient's left-sided pleural effusion following thoracentesis. No pneumothorax is noted. Minimal left basilar atelectasis is noted. The cardiac shadow is stable. A pacing device is again seen. Stable right-sided pleural effusion. A pleural drainage catheter is noted on the right.  IMPRESSION: Near-complete resolution of a left-sided pleural effusion following thoracentesis. No pneumothorax is noted.  Persistent small right-sided effusion.   Electronically Signed   By: Alcide Clever M.D.   On: 07/08/2013 20:38     ASSESSMENT: 79 year old female with PMH relevant for CHF with LVEF 39f 30 to 35%, CAD, HTN, CKD and recurrent pleural effusions. She has a PleurX catheter on the right but now presents with a new large symptomatic left pleural effusion. Consult called to assist with thoracentesis => drained 2L clear yellow fluid, transudative... 11/2 breathing much improved, CHF Rx & adjust in diuretics per [facility] & DrBensimhon/ Shirlee Latch 11-3 NAD, cxr noted   RECOMMENDATIONS: - Left thoracentesis performed with no immediate complications and significant symptomatic relief  - 2L of clear amber fluid removed and sent for micro, cell count, cytology, chemistry=> transudate - Chest X ray post thor much improved -as outpt wold have pcxr and follow up pulm on Friday -if rising or symptoms increased, consider placement of additional pleurex with Dr Zorita Pang with pulm as outpt for  palliation -PCCM available as needed.  Brett Canales Minor ACNP [facility] PCCM Pager [phone] till 3 pm If no answer page [phone] 07/10/2013, 9:38 AM   I have fully examined this patient and agree with above findings.    And edited in full  Mcarthur Rossetti. Tyson Alias, MD, FACP Pgr: [phone] [facility]

## 2013-07-11 ENCOUNTER — Telehealth: Payer: Self-pay | Admitting: Internal Medicine

## 2013-07-11 ENCOUNTER — Encounter: Payer: Self-pay | Admitting: Internal Medicine

## 2013-07-11 NOTE — Telephone Encounter (Signed)
RECOMMENDATIONS:  - Left thoracentesis performed with no immediate complications and significant symptomatic relief  - 2L of clear amber fluid removed and sent for micro, cell count, cytology, chemistry=> transudate  - Chest X ray post thor much improved  -as outpt wold have pcxr and follow up pulm on Friday  -if rising or symptoms increased, consider placement of additional pleurex with Dr Zorita Pang with pulm as outpt for palliation  -PCCM available as needed. ----  I spoke with daughter. Pt is scheduled to come in and see MW on Friday for HFU. Nothing further needed

## 2013-07-12 ENCOUNTER — Ambulatory Visit (HOSPITAL_COMMUNITY)
Admission: RE | Admit: 2013-07-12 | Discharge: 2013-07-12 | Disposition: A | Discharge status: Home / Self Care | Source: Ambulatory Visit | Attending: Internal Medicine | Admitting: Internal Medicine

## 2013-07-12 VITALS — BP 130/62 | HR 70 | Wt 171.2 lb

## 2013-07-12 DIAGNOSIS — I2510 Atherosclerotic heart disease of native coronary artery without angina pectoris: Secondary | ICD-10-CM | POA: Insufficient documentation

## 2013-07-12 DIAGNOSIS — I509 Heart failure, unspecified: Secondary | ICD-10-CM | POA: Insufficient documentation

## 2013-07-12 DIAGNOSIS — J90 Pleural effusion, not elsewhere classified: Secondary | ICD-10-CM | POA: Insufficient documentation

## 2013-07-12 DIAGNOSIS — I5022 Chronic systolic (congestive) heart failure: Secondary | ICD-10-CM | POA: Insufficient documentation

## 2013-07-12 LAB — BODY FLUID CULTURE: Culture: NO GROWTH

## 2013-07-12 MED ORDER — TORSEMIDE 20 MG PO TABS: 40.0000 mg | ORAL_TABLET | Freq: Every day | ORAL | Status: DC

## 2013-07-12 NOTE — Patient Instructions (Signed)
Increase torsemide 40 mg twice a day  Follow up with pulmonary later this week to discuss repeat thoracentesis or pleurex catheter.  BMET next week with Hospice  Follow up 2 weeks

## 2013-07-12 NOTE — Progress Notes (Signed)
Patient ID: Miranda Cruz, female   DOB: 1929/09/14, 79 y.o.   MRN: [id number] PCP: Dr. Felipa Eth  82 with history of chronic atrial fibrillation, CAD, ischemic cardiomyopathy with end stage CHF and cardiorenal syndrome presents for followup after recent hospitalization.  Patient was admitted in 05/2013 with CHF and bilateral pleural effusions.  She had had a Pleurx catheter on the right prior to admission. She was diuresed and sent home to live with her daughter. Hospice is now involved.    Post Hospital Follow up: Presented to the hospital over the weekend for SOB and was found to have pleural effusion on L, -2liters. K+ on admission 5.5 and daily PO potassium discontinued. + productive yellow cough. Reports breathing is not any better. Daughter reports not urinating much. More fatigued. No appetite. Denies SOB sitting. +fatigued and DOE.   Labs (10/14): K 3.4, creatinine 1.63       (07/10/13): K 4.5, creatinine 1.67  PMH: 1. CKD: Cardiorenal component.  2. HTN 3. Type II diabetes 4. Gout 5. CAD: s/p PCI in 1995 and 2000. 6. Bilateral THR 7. Hernia repair 8. Recurrent bilateral pleural effusions: Likely from CHF.  Has right Pleurx catheter.   9. Ischemic cardiomyopathy: Echo (8/14) with EF 30-35%, dyskinetic apex and anteroseptal wall.   10. Atrial fibrillation: Chronic.  Never on coumadin.   SH: Lives with daughter, nonsmoker.   FH: No premature CAD  ROS: All systems reviewed and negative except as per HPI.   Current Outpatient Prescriptions  Medication Sig Dispense Refill  . acetaminophen (TYLENOL) 500 MG tablet Take 1,000 mg by mouth daily as needed for pain.      Marland Kitchen allopurinol (ZYLOPRIM) 100 MG tablet Take 100 mg by mouth every morning.       Marland Kitchen ALPRAZolam (XANAX) 1 MG tablet Take 1 tablet (1 mg total) by mouth at bedtime.  30 tablet  0  . aspirin EC 81 MG tablet Take 81 mg by mouth at bedtime.      Marland Kitchen atorvastatin (LIPITOR) 40 MG tablet Take 40 mg by mouth at bedtime.       . feeding  supplement (GLUCERNA SHAKE) LIQD Take 237 mLs by mouth 3 (three) times daily between meals.  30 Can  0  . fluticasone (FLONASE) 50 MCG/ACT nasal spray Place 2 sprays into the nose 2 (two) times daily as needed for rhinitis or allergies.      Marland Kitchen glyBURIDE (DIABETA) 2.5 MG tablet Take 1.25 mg by mouth daily as needed (if sugar is over 120).      Marland Kitchen guaiFENesin (MUCINEX) 600 MG 12 hr tablet Take 1 tablet (600 mg total) by mouth 2 (two) times daily.  60 tablet  0  . levalbuterol (XOPENEX HFA) 45 MCG/ACT inhaler Inhale 1-2 puffs into the lungs 3 (three) times daily as needed for wheezing or shortness of breath.      . levothyroxine (SYNTHROID, LEVOTHROID) 125 MCG tablet Take 125 mcg by mouth daily before breakfast.       . Menthol, Topical Analgesic, (BENGAY EX) Apply 1 application topically daily as needed (hand pain).      . metoprolol succinate (TOPROL-XL) 25 MG 24 hr tablet Take 12.5 mg by mouth daily.      . nitroGLYCERIN (NITROSTAT) 0.4 MG SL tablet Place 0.4 mg under the tongue as needed for chest pain. x3 doses as needed for chest pain      . oxyCODONE (ROXICODONE INTENSOL) 100 MG/5ML concentrated solution Take 0.8 mLs (16 mg total)  by mouth every 2 (two) hours as needed.  50 mL  0  . pantoprazole (PROTONIX) 40 MG tablet Take 40 mg by mouth daily.      . polyethylene glycol (MIRALAX / GLYCOLAX) packet Take 17 g by mouth daily.  14 each  0  . sevelamer carbonate (RENVELA) 800 MG tablet Take 800 mg by mouth 2 (two) times daily. Breakfast and dinner      . torsemide (DEMADEX) 20 MG tablet Take 20 mg by mouth 2 (two) times daily.      . Vitamin D, Ergocalciferol, (DRISDOL) 50000 UNITS CAPS Take 50,000 Units by mouth every Monday, Wednesday, and Friday. Approximately every other day       No current facility-administered medications for this encounter.   Filed Vitals:   07/12/13 0954  BP: 130/62  Pulse: 70  Weight: 171 lb 4 oz (77.678 kg)  SpO2: 60%  Oxygen tank had no O2 on and placed on 6L and  O2 sats came back to 98% on 3L   General: NAD, chronically ill-appearing; 3 L O2 Neck:  JVP 10-12 no thyromegaly or thyroid nodule.  Lungs: Decreased breath sounds L>R and crackles throughout  CV: Nondisplaced PMI.  Heart irregular S1/S2, no S3/S4, no murmur.  1+ bilateral ankle edema.  No carotid bruit.  Normal pedal pulses.  Abdomen: Soft, nontender, no hepatosplenomegaly, no distention.  Skin: Intact without lesions or rashes.  Neurologic: Alert and oriented x 3.  Psych: Normal affect. Extremities: No clubbing or cyanosis.   Assessment/Plan: 1. Chronic systolic CHF: Ischemic cardiomyopathy.  Last echo with EF 30-35%.  End stage CHF with cardiorenal syndrome.  She has bilateral pleural effusions likely due to CHF. Hospice is following. Admitted to the hospital over the weekend with L thoracentesis 2L out. She reports breathing is better, but family reports more fatigue and DOE. Not urinating and weight is up about 10 lbs since discharge. She has elevated JVP on exam.  - Will increase torsemide to 40 mg BID. - Continue Toprol XL    - BMET next week with hospice and followup in 2 wks.  - Had hyperkalemia when admitted to the hospital and no longer on K+ as above check next week. 2. Recurrent bilateral pleural effusions: Has Pleurx catheter on R but not draining at home; Admitted over the weekend for L thoracentesis. Lung sounds continue to be diminished on L side. She has follow up with pulmonology on Friday for repeat CXR and discussion about whether she needs L Pleurx catheter placed or another thoracentesis. The main goal is for the patient to be comfortable. 3. CKD: Cardiorenal component. With increase in torsemide will check BMET next week.  4. CAD: Stable with no chest pain.  Continue ASA 81, statin, Toprol XL.  5. Chronic atrial fibrillation: She has never been on coumadin.  I would not start this now that she is getting hospice care.   Ulla Potash B NP-C 07/12/2013  Patient seen  with NP, agree with the above note. Ischemic cardiomyopathy with systolic CHF and bilateral pleural effusions.  Agree with increasing torsemide to 40 mg bid for volume overload with BMET next week.  Will see pulmonary on Friday, must make decision re: repeated left thoracentesis or placing Pleurx catheter on left. Ultimate goal is comfort.  She is followed by hospice.   Marca Ancona 07/12/2013

## 2013-07-13 ENCOUNTER — Emergency Department (HOSPITAL_COMMUNITY)

## 2013-07-13 ENCOUNTER — Encounter (HOSPITAL_COMMUNITY): Payer: Self-pay | Admitting: Emergency Medicine

## 2013-07-13 ENCOUNTER — Inpatient Hospital Stay (HOSPITAL_COMMUNITY)
Admission: EM | Admit: 2013-07-13 | Discharge: 2013-08-07 | DRG: 291 | Disposition: E | Attending: Internal Medicine | Admitting: Internal Medicine

## 2013-07-13 ENCOUNTER — Inpatient Hospital Stay (HOSPITAL_COMMUNITY)

## 2013-07-13 DIAGNOSIS — I2589 Other forms of chronic ischemic heart disease: Secondary | ICD-10-CM | POA: Diagnosis present

## 2013-07-13 DIAGNOSIS — E43 Unspecified severe protein-calorie malnutrition: Secondary | ICD-10-CM | POA: Diagnosis present

## 2013-07-13 DIAGNOSIS — J9601 Acute respiratory failure with hypoxia: Secondary | ICD-10-CM | POA: Diagnosis present

## 2013-07-13 DIAGNOSIS — J90 Pleural effusion, not elsewhere classified: Secondary | ICD-10-CM

## 2013-07-13 DIAGNOSIS — M109 Gout, unspecified: Secondary | ICD-10-CM | POA: Diagnosis present

## 2013-07-13 DIAGNOSIS — J9620 Acute and chronic respiratory failure, unspecified whether with hypoxia or hypercapnia: Secondary | ICD-10-CM | POA: Diagnosis present

## 2013-07-13 DIAGNOSIS — E875 Hyperkalemia: Secondary | ICD-10-CM | POA: Diagnosis present

## 2013-07-13 DIAGNOSIS — I5023 Acute on chronic systolic (congestive) heart failure: Secondary | ICD-10-CM

## 2013-07-13 DIAGNOSIS — Z79899 Other long term (current) drug therapy: Secondary | ICD-10-CM

## 2013-07-13 DIAGNOSIS — I5022 Chronic systolic (congestive) heart failure: Secondary | ICD-10-CM

## 2013-07-13 DIAGNOSIS — M545 Low back pain, unspecified: Secondary | ICD-10-CM | POA: Diagnosis present

## 2013-07-13 DIAGNOSIS — Z96649 Presence of unspecified artificial hip joint: Secondary | ICD-10-CM

## 2013-07-13 DIAGNOSIS — N179 Acute kidney failure, unspecified: Secondary | ICD-10-CM | POA: Diagnosis present

## 2013-07-13 DIAGNOSIS — Z66 Do not resuscitate: Secondary | ICD-10-CM | POA: Diagnosis present

## 2013-07-13 DIAGNOSIS — Z7982 Long term (current) use of aspirin: Secondary | ICD-10-CM

## 2013-07-13 DIAGNOSIS — R627 Adult failure to thrive: Secondary | ICD-10-CM | POA: Diagnosis present

## 2013-07-13 DIAGNOSIS — I509 Heart failure, unspecified: Secondary | ICD-10-CM | POA: Diagnosis present

## 2013-07-13 DIAGNOSIS — I4891 Unspecified atrial fibrillation: Secondary | ICD-10-CM | POA: Diagnosis present

## 2013-07-13 DIAGNOSIS — M129 Arthropathy, unspecified: Secondary | ICD-10-CM | POA: Diagnosis present

## 2013-07-13 DIAGNOSIS — D72829 Elevated white blood cell count, unspecified: Secondary | ICD-10-CM | POA: Diagnosis present

## 2013-07-13 DIAGNOSIS — J9600 Acute respiratory failure, unspecified whether with hypoxia or hypercapnia: Secondary | ICD-10-CM

## 2013-07-13 DIAGNOSIS — E7800 Pure hypercholesterolemia, unspecified: Secondary | ICD-10-CM | POA: Diagnosis present

## 2013-07-13 DIAGNOSIS — Z950 Presence of cardiac pacemaker: Secondary | ICD-10-CM

## 2013-07-13 DIAGNOSIS — E119 Type 2 diabetes mellitus without complications: Secondary | ICD-10-CM | POA: Diagnosis present

## 2013-07-13 DIAGNOSIS — I252 Old myocardial infarction: Secondary | ICD-10-CM

## 2013-07-13 DIAGNOSIS — E039 Hypothyroidism, unspecified: Secondary | ICD-10-CM | POA: Diagnosis present

## 2013-07-13 DIAGNOSIS — Z515 Encounter for palliative care: Secondary | ICD-10-CM

## 2013-07-13 DIAGNOSIS — I5043 Acute on chronic combined systolic (congestive) and diastolic (congestive) heart failure: Principal | ICD-10-CM | POA: Diagnosis present

## 2013-07-13 DIAGNOSIS — Z9861 Coronary angioplasty status: Secondary | ICD-10-CM

## 2013-07-13 DIAGNOSIS — G8929 Other chronic pain: Secondary | ICD-10-CM | POA: Diagnosis present

## 2013-07-13 DIAGNOSIS — I130 Hypertensive heart and chronic kidney disease with heart failure and stage 1 through stage 4 chronic kidney disease, or unspecified chronic kidney disease: Secondary | ICD-10-CM | POA: Diagnosis present

## 2013-07-13 DIAGNOSIS — Z885 Allergy status to narcotic agent status: Secondary | ICD-10-CM

## 2013-07-13 DIAGNOSIS — G9340 Encephalopathy, unspecified: Secondary | ICD-10-CM | POA: Diagnosis present

## 2013-07-13 DIAGNOSIS — I2510 Atherosclerotic heart disease of native coronary artery without angina pectoris: Secondary | ICD-10-CM | POA: Diagnosis present

## 2013-07-13 DIAGNOSIS — I255 Ischemic cardiomyopathy: Secondary | ICD-10-CM

## 2013-07-13 DIAGNOSIS — J9383 Other pneumothorax: Secondary | ICD-10-CM | POA: Diagnosis present

## 2013-07-13 DIAGNOSIS — N184 Chronic kidney disease, stage 4 (severe): Secondary | ICD-10-CM | POA: Diagnosis present

## 2013-07-13 LAB — CBC WITH DIFFERENTIAL/PLATELET
Basophils Absolute: 0 10*3/uL (ref 0.0–0.1)
Eosinophils Relative: 0 % (ref 0–5)
HCT: 42.7 % (ref 36.0–46.0)
Hemoglobin: 12.8 g/dL (ref 12.0–15.0)
Lymphocytes Relative: 7 % — ABNORMAL LOW (ref 12–46)
Lymphs Abs: 0.9 10*3/uL (ref 0.7–4.0)
MCV: 98.2 fL (ref 78.0–100.0)
Monocytes Absolute: 0.6 10*3/uL (ref 0.1–1.0)
Monocytes Relative: 5 % (ref 3–12)
Neutro Abs: 10 10*3/uL — ABNORMAL HIGH (ref 1.7–7.7)
RBC: 4.35 MIL/uL (ref 3.87–5.11)
RDW: 15.7 % — ABNORMAL HIGH (ref 11.5–15.5)
WBC: 11.4 10*3/uL — ABNORMAL HIGH (ref 4.0–10.5)

## 2013-07-13 LAB — BASIC METABOLIC PANEL
BUN: 85 mg/dL — ABNORMAL HIGH (ref 6–23)
CO2: 38 mEq/L — ABNORMAL HIGH (ref 19–32)
Calcium: 9 mg/dL (ref 8.4–10.5)
Chloride: 91 mEq/L — ABNORMAL LOW (ref 96–112)
Creatinine, Ser: 2.7 mg/dL — ABNORMAL HIGH (ref 0.50–1.10)
GFR calc non Af Amer: 16 mL/min — ABNORMAL LOW (ref >90)
Glucose, Bld: 220 mg/dL — ABNORMAL HIGH (ref 70–99)

## 2013-07-13 LAB — GLUCOSE, CAPILLARY: Glucose-Capillary: 173 mg/dL — ABNORMAL HIGH (ref 70–99)

## 2013-07-13 MED ORDER — SODIUM CHLORIDE 0.9 % IV SOLN: 250.0000 mL | INTRAVENOUS | Status: DC | PRN

## 2013-07-13 MED ORDER — ALLOPURINOL 100 MG PO TABS
100.0000 mg | ORAL_TABLET | Freq: Every morning | ORAL | Status: DC
  Filled 2013-07-13 (×2): qty 1

## 2013-07-13 MED ORDER — LEVALBUTEROL TARTRATE 45 MCG/ACT IN AERO: 1.0000 | INHALATION_SPRAY | Freq: Three times a day (TID) | RESPIRATORY_TRACT | Status: DC | PRN

## 2013-07-13 MED ORDER — SEVELAMER CARBONATE 800 MG PO TABS
800.0000 mg | ORAL_TABLET | Freq: Two times a day (BID) | ORAL | Status: DC
  Filled 2013-07-13 (×5): qty 1

## 2013-07-13 MED ORDER — INSULIN REGULAR BOLUS VIA INFUSION: 10.0000 [IU] | Freq: Once | INTRAVENOUS | Status: DC

## 2013-07-13 MED ORDER — ATORVASTATIN CALCIUM 40 MG PO TABS
40.0000 mg | ORAL_TABLET | Freq: Every day | ORAL | Status: DC
  Filled 2013-07-13 (×3): qty 1

## 2013-07-13 MED ORDER — ACETAMINOPHEN 650 MG RE SUPP: 650.0000 mg | Freq: Four times a day (QID) | RECTAL | Status: DC | PRN

## 2013-07-13 MED ORDER — SORBITOL 70 % SOLN
30.0000 mL | Freq: Every day | Status: DC | PRN
  Filled 2013-07-13: qty 30

## 2013-07-13 MED ORDER — HEPARIN SODIUM (PORCINE) 5000 UNIT/ML IJ SOLN
5000.0000 [IU] | Freq: Three times a day (TID) | INTRAMUSCULAR | Status: DC
  Administered 2013-07-14 – 2013-07-15 (×5): 5000 [IU] via SUBCUTANEOUS
  Filled 2013-07-13 (×8): qty 1

## 2013-07-13 MED ORDER — INSULIN ASPART 100 UNIT/ML ~~LOC~~ SOLN: 0.0000 [IU] | Freq: Three times a day (TID) | SUBCUTANEOUS | Status: DC

## 2013-07-13 MED ORDER — SODIUM CHLORIDE 0.9 % IJ SOLN: 3.0000 mL | INTRAMUSCULAR | Status: DC | PRN

## 2013-07-13 MED ORDER — ALPRAZOLAM 0.25 MG PO TABS: 1.0000 mg | ORAL_TABLET | Freq: Every day | ORAL | Status: DC

## 2013-07-13 MED ORDER — NITROGLYCERIN 0.4 MG SL SUBL: 0.4000 mg | SUBLINGUAL_TABLET | SUBLINGUAL | Status: DC | PRN

## 2013-07-13 MED ORDER — LEVOTHYROXINE SODIUM 125 MCG PO TABS
125.0000 ug | ORAL_TABLET | Freq: Every day | ORAL | Status: DC
  Filled 2013-07-13 (×3): qty 1

## 2013-07-13 MED ORDER — INSULIN ASPART 100 UNIT/ML ~~LOC~~ SOLN: 0.0000 [IU] | Freq: Every day | SUBCUTANEOUS | Status: DC

## 2013-07-13 MED ORDER — PANTOPRAZOLE SODIUM 40 MG PO TBEC: 40.0000 mg | DELAYED_RELEASE_TABLET | Freq: Every day | ORAL | Status: DC

## 2013-07-13 MED ORDER — GLUCERNA SHAKE PO LIQD: 237.0000 mL | Freq: Three times a day (TID) | ORAL | Status: DC

## 2013-07-13 MED ORDER — ACETAMINOPHEN 325 MG PO TABS: 650.0000 mg | ORAL_TABLET | Freq: Four times a day (QID) | ORAL | Status: DC | PRN

## 2013-07-13 MED ORDER — ASPIRIN EC 81 MG PO TBEC
81.0000 mg | DELAYED_RELEASE_TABLET | Freq: Every day | ORAL | Status: DC
  Filled 2013-07-13 (×3): qty 1

## 2013-07-13 MED ORDER — TORSEMIDE 20 MG PO TABS
40.0000 mg | ORAL_TABLET | Freq: Two times a day (BID) | ORAL | Status: DC
  Filled 2013-07-13 (×6): qty 2

## 2013-07-13 MED ORDER — POLYETHYLENE GLYCOL 3350 17 G PO PACK
17.0000 g | PACK | Freq: Every day | ORAL | Status: DC
  Filled 2013-07-13 (×2): qty 1

## 2013-07-13 MED ORDER — OXYCODONE HCL 20 MG/ML PO CONC
16.0000 mg | ORAL | Status: DC | PRN
  Administered 2013-07-14 – 2013-07-15 (×4): 16 mg via ORAL
  Filled 2013-07-13 (×6): qty 1

## 2013-07-13 MED ORDER — ONDANSETRON HCL 4 MG/2ML IJ SOLN: 4.0000 mg | Freq: Four times a day (QID) | INTRAMUSCULAR | Status: DC | PRN

## 2013-07-13 MED ORDER — ALUM & MAG HYDROXIDE-SIMETH 200-200-20 MG/5ML PO SUSP: 30.0000 mL | Freq: Four times a day (QID) | ORAL | Status: DC | PRN

## 2013-07-13 MED ORDER — ONDANSETRON HCL 4 MG PO TABS: 4.0000 mg | ORAL_TABLET | Freq: Four times a day (QID) | ORAL | Status: DC | PRN

## 2013-07-13 MED ORDER — INSULIN ASPART 100 UNIT/ML IV SOLN: 10.0000 [IU] | Freq: Once | INTRAVENOUS | Status: DC

## 2013-07-13 MED ORDER — SODIUM CHLORIDE 0.9 % IJ SOLN
3.0000 mL | Freq: Two times a day (BID) | INTRAMUSCULAR | Status: DC
  Administered 2013-07-14 (×3): 3 mL via INTRAVENOUS

## 2013-07-13 MED ORDER — ONDANSETRON HCL 4 MG/2ML IJ SOLN: 4.0000 mg | Freq: Three times a day (TID) | INTRAMUSCULAR | Status: DC | PRN

## 2013-07-13 MED ORDER — METOPROLOL SUCCINATE 12.5 MG HALF TABLET
12.5000 mg | ORAL_TABLET | Freq: Every day | ORAL | Status: DC
  Filled 2013-07-13 (×2): qty 1

## 2013-07-13 MED ORDER — DEXTROSE 50 % IV SOLN: 0.5000 | Freq: Once | INTRAVENOUS | Status: DC

## 2013-07-13 NOTE — Procedures (Signed)
Thoracentesis Procedure Note  Pre-operative Diagnosis: Pleural effusion  Post-operative Diagnosis: Improved pleural effusion  Indications: Large left pleural effusion in pt with CHF  Procedure Details  Consent: Informed consent was obtained. Risks of the procedure were discussed including: infection, bleeding, pain, pneumothorax.  Under sterile conditions the patient was positioned. Betadine solution and sterile drapes were utilized.  1% plain lidocaine was used to anesthetize the 7th rib space. Fluid was obtained without any difficulties and minimal blood loss.  A dressing was applied to the wound and wound care instructions were provided.   Findings 2000 ml of clear straw-colored pleural fluid was obtained. A sample was sent to Pathology for cytogenetics, flow, and cell counts, as well as for infection analysis.  Complications:  None; patient tolerated the procedure well.          Condition: stable  Plan A follow up chest x-ray was ordered. Bed Rest for 2 hours. Tylenol 650 mg. for pain.  Leona Singleton, MD Family Practice PGY-2 07/29/2013 10:00 PM  U/S used in placement.   I scrubbed in and supervised the entire procedure.  Patient seen and examined, agree with above note.  I dictated the care and orders written for this patient under my direction.  Alyson Reedy, MD [phone]

## 2013-07-13 NOTE — H&P (Signed)
[facility] History and Physical  Miranda Cruz:[id number] DOB: 1929-12-30 DOA: 07-30-13  Referring physician:  PCP: Hoyle Sauer, MD  Specialists:   Chief Complaint: SOB  HPI: Miranda Cruz is a 79 y.o. female with a past medical history of ischemic cardiac myopathy, ejection fraction of 30-35%, recurrent pleural effusions, status post right-sided pleural catheter placement, recently discharged from our service on 07/10/2013 at which time she presented with complaints of shortness of breath, found to have a large left-sided pleural effusion undergoing thoracentesis with removal of 2 L of fluid. She was discharged home in stable condition with home hospice services. She presents to the emergent apartment today with worsening shortness of breath, becoming increasingly lethargic, having minimal by mouth intake, with an overall steep functional decline since her recent hospitalization. Family members do not report fevers, chills, nausea vomiting, chest pain or focal deficits. She was evaluated by hospice nurse today, found to have significant diminished breath sounds to left lung, referred to the emergency department for further evaluation. She was found to be hypoxemic on presentation with an O2 sat of 60%, placed on a 100% nonrebreather. A chest x-ray showed the presence of a large left-sided pleural effusion. I discussed case with pulmonary critical care medicine for thoracentesis this evening as well as consideration for placement of left-sided Pleurx catheter. Patient unable to participate in her primary care or provide history. History was obtained from emergency room staff and family members who were present at bedside                                           Review of Systems: Could not obtain a reliable review of systems.   Past Medical History  Diagnosis Date  . Myocardial infarction 1995  . CHF (congestive heart failure)   . Pacemaker   . Hypertension   . High cholesterol   .  Pleural effusion on right 04/27/2013    [name] 04/27/2013 (04/27/2013)  . Chronic kidney disease (CKD), stage IV (severe)     [name] 04/27/2013 (04/27/2013)  . Pneumonia 1948; 1954; 02/2013; ?04/27/2013    "real bad the first 2 times"; don't remember how bad in 02/2013; might have it now"  (04/27/2013)  . Exertional shortness of breath     "for the past week" (04/27/2013)  . Hypothyroidism   . Type II diabetes mellitus   . History of blood transfusion 2010    "w/hip replacement" (04/27/2013)  . Arthritis     "back; hands" (04/27/2013)  . Chronic lower back pain   . Gout     "several times; left ankle; fingers" (04/27/2013)   Past Surgical History  Procedure Laterality Date  . Coronary angioplasty  1995  . Coronary angioplasty with stent placement  2000    "?1" (04/27/2013)  . Abdominal wall mesh  removal  07/2007    "took the part out that was infected" (04/27/2013)  . Total hip arthroplasty Left 2009  . Total hip arthroplasty Right 2010  . Insert / replace / remove pacemaker  ~ 2011  . Hernia repair  06/2007    "umbilical hernia repair" (04/27/2013)  . Cataract extraction w/ intraocular lens implant Right 05/2012  . Joint replacement    . Thoracentesis Right 04/28/2013   Social History:  reports that she has never smoked. She has never used smokeless tobacco. She reports that she does not drink  alcohol or use illicit drugs.   Allergies  Allergen Reactions  . Lisinopril     Informed by patient and family that medication causes kidney failure in patient and during last hospitalization was told by her cardiologist not to take this med.  . Morphine And Related Other (See Comments)    Made her loopy  . Phenergan [Promethazine Hcl] Other (See Comments)    Made her crazy  . Prednisone Other (See Comments)    Confusion, bad dreams, insomnia  . Trazodone And Nefazodone Other (See Comments)    hallucinations     Family History  Problem Relation Age of Onset  . Other Mother   . Other Father      Prior to Admission medications   Medication Sig Start Date End Date Taking? Authorizing Provider  acetaminophen (TYLENOL) 500 MG tablet Take 1,000 mg by mouth daily as needed for pain.   Yes Historical Provider, MD  allopurinol (ZYLOPRIM) 100 MG tablet Take 100 mg by mouth every morning.    Yes Historical Provider, MD  ALPRAZolam Prudy Feeler) 1 MG tablet Take 1 tablet (1 mg total) by mouth at bedtime. 06/08/13  Yes Meredeth Ide, MD  aspirin EC 81 MG tablet Take 81 mg by mouth at bedtime.   Yes Historical Provider, MD  atorvastatin (LIPITOR) 40 MG tablet Take 40 mg by mouth at bedtime.    Yes Historical Provider, MD  feeding supplement (GLUCERNA SHAKE) LIQD Take 237 mLs by mouth 3 (three) times daily between meals. 06/08/13  Yes Meredeth Ide, MD  fluticasone (FLONASE) 50 MCG/ACT nasal spray Place 2 sprays into the nose 2 (two) times daily as needed for rhinitis or allergies.   Yes Historical Provider, MD  glyBURIDE (DIABETA) 2.5 MG tablet Take 1.25 mg by mouth daily as needed (if sugar is over 120).   Yes Historical Provider, MD  guaiFENesin (MUCINEX) 600 MG 12 hr tablet Take 1 tablet (600 mg total) by mouth 2 (two) times daily. 02/11/13  Yes Ripudeep Jenna Luo, MD  levalbuterol [facility] HFA) 45 MCG/ACT inhaler Inhale 1-2 puffs into the lungs 3 (three) times daily as needed for wheezing or shortness of breath. 02/11/13  Yes Ripudeep Jenna Luo, MD  levothyroxine (SYNTHROID, LEVOTHROID) 125 MCG tablet Take 125 mcg by mouth daily before breakfast.    Yes Historical Provider, MD  Menthol, Topical Analgesic, (BENGAY EX) Apply 1 application topically daily as needed (hand pain).   Yes Historical Provider, MD  metoprolol succinate (TOPROL-XL) 25 MG 24 hr tablet Take 12.5 mg by mouth daily.   Yes Historical Provider, MD  nitroGLYCERIN (NITROSTAT) 0.4 MG SL tablet Place 0.4 mg under the tongue as needed for chest pain. x3 doses as needed for chest pain   Yes Historical Provider, MD  oxyCODONE (ROXICODONE INTENSOL) 100  MG/5ML concentrated solution Take 16 mg by mouth every 4 (four) hours as needed (pain).   Yes Historical Provider, MD  pantoprazole (PROTONIX) 40 MG tablet Take 40 mg by mouth daily.   Yes Historical Provider, MD  polyethylene glycol (MIRALAX / GLYCOLAX) packet Take 17 g by mouth daily. 06/08/13  Yes Meredeth Ide, MD  sevelamer carbonate (RENVELA) 800 MG tablet Take 800 mg by mouth 2 (two) times daily. Breakfast and dinner   Yes Historical Provider, MD  torsemide (DEMADEX) 20 MG tablet Take 40 mg by mouth 2 (two) times daily.   Yes Historical Provider, MD  Vitamin D, Ergocalciferol, (DRISDOL) 50000 UNITS CAPS Take 50,000 Units by mouth every Monday,  Wednesday, and Friday. Approximately every other day   Yes Historical Provider, MD   Physical Exam: Filed Vitals:   07/28/2013 1607  BP: 104/43  Pulse: 78  Temp: 97.7 F (36.5 C)  Resp: 22     General:  Chronically ill-appearing, in mild to moderate respiratory distress. 100% nonrebreather in place  Eyes: Pupils are equal round and reactive to light extraocular movement is intact. Dry oral mucosa  Neck: Positive jugular venous distention, neck is symmetrical, supple  Cardiovascular: Irregular rate and rhythm, tachycardic, normal S1-S2  Respiratory: Absent breath sounds to left lung, right lung clear to auscultation no wheezing or rhonchi noted  Abdomen: Soft nontender nondistended positive bowel sounds  Skin: No rashes or lesions noted  Musculoskeletal: Present range of motion to all extremities, no edema noted  Psychiatric: Patient is lethargic, however arousable to voice. Minimal a verbal.  Neurologic: Patient lethargic. No facial droop or slurred speech noted. Having not focal neurologic examination, 5 of 5 muscle strength to extremities.  Labs on Admission:  Basic Metabolic Panel:  Recent Labs Lab 07/08/13 1558 07/09/13 0616 07/10/13 0730 2013/07/28 1629  NA 137 142 134* 136  K 5.5* 5.1 4.5 5.8*  CL 95* 99 92* 91*  CO2 36*  36* 31 38*  GLUCOSE 224* 118* 152* 220*  BUN 54* 55* 66* 85*  CREATININE 1.59* 1.52* 1.67* 2.70*  CALCIUM 9.0 8.9 8.5 9.0   Liver Function Tests:  Recent Labs Lab 07/08/13 1558 07/09/13 0616  AST 29 36  ALT 8 9  ALKPHOS 118* 101  BILITOT 0.5 0.6  PROT 6.8 6.0  ALBUMIN 2.8* 2.3*   No results found for this basename: LIPASE, AMYLASE,  in the last 168 hours No results found for this basename: AMMONIA,  in the last 168 hours CBC:  Recent Labs Lab 07/08/13 1558 07/09/13 0616 07/28/13 1629  WBC 9.3 6.5 11.4*  NEUTROABS  --   --  10.0*  HGB 12.2 12.3 12.8  HCT 40.5 40.5 42.7  MCV 99.0 97.4 98.2  PLT 186 153 242   Cardiac Enzymes:  Recent Labs Lab 07/08/13 1558  TROPONINI <0.30    BNP (last 3 results)  Recent Labs  05/29/13 2056 05/30/13 1424 07/08/13 1558  PROBNP 9064.0* 8898.0* 7455.0*   CBG: No results found for this basename: GLUCAP,  in the last 168 hours  Radiological Exams on Admission: Dg Chest 2 View  07/28/2013   CLINICAL DATA:  Shortness of breath, cough, congestion  EXAM: CHEST  2 VIEW  COMPARISON:  07/10/2013  FINDINGS: There is now complete opacification of the left hemi thorax with interval increase in the previously seen pleural effusion, with complete obscuration of the underlying left lung. Moderate right pleural effusion is reidentified with obscuration of the underlying lung as well. This is stable. Heart size is not accurately assessed. Dual lead left-sided pacer in place. No acute osseous abnormality.  IMPRESSION: Enlargement of now large left pleural effusion with complete opacification of the left hemi thorax.   Electronically Signed   By: Christiana Pellant M.D.   On: 07/28/2013 17:14    EKG: Independently reviewed.   Assessment/Plan Active Problems:   ARF (acute renal failure)   CKD (chronic kidney disease) stage 4, GFR 15-29 ml/min   DM (diabetes mellitus)   Cardiomyopathy, ischemic   Encephalopathy acute   Acute respiratory failure  with hypoxia   Pleural effusion, left   Severe protein-calorie malnutrition   Chronic systolic CHF (congestive heart failure)   1. Recurrent  left-sided pleural effusion. Patient with a history of ischemic cardiac myopathy, having an ejection fraction of 30-35% with history of recurrent right-sided pleural effusion status post Pleurx catheter placement. She was recently discharged from our service on 07/10/2013 at which time she underwent thoracentesis with removal of 2 L of fluid. She presents with worsening shortness of breath, x-ray today showing reaccumulation of her left sided pleural effusion. I discussed case with pulmonary critical care medicine, requesting consultation for thoracentesis as well as consideration for Pleurx catheter placement to left lung. Appreciate assistance. 2. Acute on chronic hypoxemic respiratory failure. Patient presented in respiratory distress, having oxygen saturations of 60%. Likely secondary to reaccumulation of left-sided pleural effusion. Pulmonary critical care consulted, plan for thoracentesis this evening. 3. Chronic systolic congestive heart failure, with most recent transthoracic echocardiogram performed on 04/29/2013 showing ejection fraction of 30-35%, dyskinesis of the entire anteroseptal and apical myocardium. Will continue torsemide 40 mg by mouth twice a day, obtain daily weights and strict ins and outs. 4. Acute on chronic renal failure. She has a history chronic kidney disease with a baseline creatinine near 1.6-1.7. Patient having deterioration to kidney function as lab work today showing a creatinine of 2.7 with BUN of 85. On 07/10/2013 she had a creatinine 1.67 with you and 66. She is on diuretic therapy with torsemide 40 mg by mouth twice a day. She also has ischemic heart myopathy with ejection fraction of 30-35%. Worsening kidney function likely secondary to cardiorenal syndrome. 5. Hyperkalemia. Likely secondary to acute renal failure. Lab work  showing a potassium of 5.8. Will administer 10 units of insulin with 1/2 amp of D50. 6. Leukocytosis. Lab work showing a white count of 11,400. She is currently afebrile. Will monitor. 7. Type 2 diabetes mellitus. Will perform Accu-Cheks q. a.c. each bedtime with sliding scale coverage, hold glyburide for now. 8. Coronary artery disease. Continue antiplatelet therapy, statin, and beta blocker. 9. DVT prophylaxis. Heparin 5000 units 3 times a day    Code Status: DO NOT RESUSCITATE Family Communication: Plan discussed with patient's family members present at bedside Disposition Plan: Admit to the inpatient service, pulmonary critical care consultation placed for thoracentesis  Time spent: 70 minutes  Jeralyn Bennett [facility] Pager [phone]  If 7PM-7AM, please contact night-coverage [email or web address] Password [facility] 07/14/2013, 6:31 PM

## 2013-07-13 NOTE — Consult Note (Signed)
PULMONARY  / CRITICAL CARE MEDICINE  Name: Miranda Cruz MRN: [id number] DOB: Dec 19, 1929    ADMISSION DATE:  07/14/2013 CONSULTATION DATE:  07/08/13  REFERRING MD :  Lambert Keto, MD PRIMARY SERVICE: [facility]  CHIEF COMPLAINT:  SOB  BRIEF PATIENT DESCRIPTION:  79 year old female with PMH relevant for CHF with LVEF 15f 30 to 35%, CAD, HTN, CKD and recurrent pleural effusions. She has a PleurX catheter on the right but now presents with a new large symptomatic left pleural effusion. Consult called to assist with thoracentesis.   SIGNIFICANT EVENTS / STUDIES:  Chest X ray with new large left pleural effusion.  LINES / TUBES: Peripheral IV's R pleurex catheter date unknown  CULTURES: No cultures  ANTIBIOTICS: No antibiotics  PAST MEDICAL HISTORY :  Past Medical History  Diagnosis Date  . Myocardial infarction 1995  . CHF (congestive heart failure)   . Pacemaker   . Hypertension   . High cholesterol   . Pleural effusion on right 04/27/2013    [name] 04/27/2013 (04/27/2013)  . Chronic kidney disease (CKD), stage IV (severe)     [name] 04/27/2013 (04/27/2013)  . Pneumonia 1948; 1954; 02/2013; ?04/27/2013    "real bad the first 2 times"; don't remember how bad in 02/2013; might have it now"  (04/27/2013)  . Exertional shortness of breath     "for the past week" (04/27/2013)  . Hypothyroidism   . Type II diabetes mellitus   . History of blood transfusion 2010    "w/hip replacement" (04/27/2013)  . Arthritis     "back; hands" (04/27/2013)  . Chronic lower back pain   . Gout     "several times; left ankle; fingers" (04/27/2013)   Past Surgical History  Procedure Laterality Date  . Coronary angioplasty  1995  . Coronary angioplasty with stent placement  2000    "?1" (04/27/2013)  . Abdominal wall mesh  removal  07/2007    "took the part out that was infected" (04/27/2013)  . Total hip arthroplasty Left 2009  . Total hip arthroplasty Right 2010  . Insert / replace / remove  pacemaker  ~ 2011  . Hernia repair  06/2007    "umbilical hernia repair" (04/27/2013)  . Cataract extraction w/ intraocular lens implant Right 05/2012  . Joint replacement    . Thoracentesis Right 04/28/2013   Prior to Admission medications   Medication Sig Start Date End Date Taking? Authorizing Provider  acetaminophen (TYLENOL) 500 MG tablet Take 1,000 mg by mouth daily as needed for pain.   Yes Historical Provider, MD  allopurinol (ZYLOPRIM) 100 MG tablet Take 100 mg by mouth every morning.    Yes Historical Provider, MD  ALPRAZolam Prudy Feeler) 1 MG tablet Take 1 tablet (1 mg total) by mouth at bedtime. 06/08/13  Yes Meredeth Ide, MD  aspirin EC 81 MG tablet Take 81 mg by mouth at bedtime.   Yes Historical Provider, MD  atorvastatin (LIPITOR) 40 MG tablet Take 40 mg by mouth at bedtime.    Yes Historical Provider, MD  feeding supplement (GLUCERNA SHAKE) LIQD Take 237 mLs by mouth 3 (three) times daily between meals. 06/08/13  Yes Meredeth Ide, MD  fluticasone (FLONASE) 50 MCG/ACT nasal spray Place 2 sprays into the nose 2 (two) times daily as needed for rhinitis or allergies.   Yes Historical Provider, MD  glyBURIDE (DIABETA) 2.5 MG tablet Take 1.25 mg by mouth daily as needed (if sugar is over 120).   Yes Historical Provider,  MD  guaiFENesin (MUCINEX) 600 MG 12 hr tablet Take 1 tablet (600 mg total) by mouth 2 (two) times daily. 02/11/13  Yes Ripudeep Jenna Luo, MD  levalbuterol [facility] HFA) 45 MCG/ACT inhaler Inhale 1-2 puffs into the lungs 3 (three) times daily as needed for wheezing or shortness of breath. 02/11/13  Yes Ripudeep Jenna Luo, MD  levothyroxine (SYNTHROID, LEVOTHROID) 125 MCG tablet Take 125 mcg by mouth daily before breakfast.    Yes Historical Provider, MD  Menthol, Topical Analgesic, (BENGAY EX) Apply 1 application topically daily as needed (hand pain).   Yes Historical Provider, MD  metoprolol succinate (TOPROL-XL) 25 MG 24 hr tablet Take 12.5 mg by mouth daily.   Yes Historical Provider, MD   nitroGLYCERIN (NITROSTAT) 0.4 MG SL tablet Place 0.4 mg under the tongue as needed for chest pain. x3 doses as needed for chest pain   Yes Historical Provider, MD  oxyCODONE (ROXICODONE INTENSOL) 100 MG/5ML concentrated solution Take 0.3 mLs (6 mg total) by mouth every 2 (two) hours as needed for pain. 06/08/13  Yes Meredeth Ide, MD  pantoprazole (PROTONIX) 40 MG tablet Take 40 mg by mouth daily.   Yes Historical Provider, MD  polyethylene glycol (MIRALAX / GLYCOLAX) packet Take 17 g by mouth daily. 06/08/13  Yes Meredeth Ide, MD  potassium chloride (K-DUR) 10 MEQ tablet Take 1 tablet (10 mEq total) by mouth daily. 06/16/13  Yes Laurey Morale, MD  sevelamer carbonate (RENVELA) 800 MG tablet Take 800 mg by mouth 2 (two) times daily. Breakfast and dinner   Yes Historical Provider, MD  torsemide (DEMADEX) 20 MG tablet Take 20 mg by mouth 2 (two) times daily. 06/08/13  Yes Meredeth Ide, MD  Vitamin D, Ergocalciferol, (DRISDOL) 50000 UNITS CAPS Take 50,000 Units by mouth every Monday, Wednesday, and Friday. Approximately every other day   Yes Historical Provider, MD   Allergies  Allergen Reactions  . Lisinopril     Informed by patient and family that medication causes kidney failure in patient and during last hospitalization was told by her cardiologist not to take this med.  . Morphine And Related Other (See Comments)    Made her loopy  . Phenergan [Promethazine Hcl] Other (See Comments)    Made her crazy  . Prednisone Other (See Comments)    Confusion, bad dreams, insomnia  . Trazodone And Nefazodone Other (See Comments)    hallucinations     FAMILY HISTORY:  Family History  Problem Relation Age of Onset  . Other Mother   . Other Father    SOCIAL HISTORY:  reports that she has never smoked. She has never used smokeless tobacco. She reports that she does not drink alcohol or use illicit drugs.  REVIEW OF SYSTEMS:  All systems reviewed and found negative except for what I mentioned in the  HPI.  SUBJECTIVE:   VITAL SIGNS: Temp:  [97.3 F (36.3 C)-97.7 F (36.5 C)] 97.3 F (36.3 C) (11/06 2020) Pulse Rate:  [69-78] 70 (11/06 2020) Resp:  [11-22] 14 (11/06 2020) BP: (104-110)/(43-89) 110/70 mmHg (11/06 2020) SpO2:  [78 %-98 %] 98 % (11/06 2020) Weight:  [78.2 kg (172 lb 6.4 oz)] 78.2 kg (172 lb 6.4 oz) (11/06 2020) HEMODYNAMICS:   VENTILATOR SETTINGS:   INTAKE / OUTPUT: Intake/Output   None     PHYSICAL EXAMINATION: General: Pleasant female patient in mild respiratory distress. Eyes: Anicteric sclerae. ENT: Oropharynx clear. Moist mucous membranes. No thrush Lymph: No cervical, supraclavicular, or axillary lymphadenopathy.  Heart: Normal S1, S2. No murmurs, rubs, or gallops appreciated. No bruits, equal pulses. Lungs: Diminished breath sounds to the left. Right PleurX catheter in place. Normal upper airway sounds without evidence of stridor. Abdomen: Abdomen soft, non-tender and not distended, normoactive bowel sounds. No hepatosplenomegaly or masses. Musculoskeletal: No clubbing or synovitis. Mild 1+ LE edema. Skin: No rashes or lesions Neuro: No focal neurologic deficits.   LABS:  CBC Recent Labs     08/06/2013  1629  WBC  11.4*  HGB  12.8  HCT  42.7  PLT  242   Coag's No results found for this basename: APTT, INR,  in the last 72 hours BMET Recent Labs     08/03/2013  1629  NA  136  K  5.8*  CL  91*  CO2  38*  BUN  85*  CREATININE  2.70*  GLUCOSE  220*   Electrolytes Recent Labs     07/29/2013  1629  CALCIUM  9.0   Sepsis Markers No results found for this basename: LACTICACIDVEN, PROCALCITON, O2SATVEN,  in the last 72 hours ABG No results found for this basename: PHART, PCO2ART, PO2ART,  in the last 72 hours Liver Enzymes No results found for this basename: AST, ALT, ALKPHOS, BILITOT, ALBUMIN,  in the last 72 hours Cardiac Enzymes No results found for this basename: TROPONINI, PROBNP,  in the last 72 hours Glucose No results found  for this basename: GLUCAP,  in the last 72 hours  Imaging Dg Chest 2 View  08/02/2013   CLINICAL DATA:  Shortness of breath, cough, congestion  EXAM: CHEST  2 VIEW  COMPARISON:  07/10/2013  FINDINGS: There is now complete opacification of the left hemi thorax with interval increase in the previously seen pleural effusion, with complete obscuration of the underlying left lung. Moderate right pleural effusion is reidentified with obscuration of the underlying lung as well. This is stable. Heart size is not accurately assessed. Dual lead left-sided pacer in place. No acute osseous abnormality.  IMPRESSION: Enlargement of now large left pleural effusion with complete opacification of the left hemi thorax.   Electronically Signed   By: Christiana Pellant M.D.   On: 07/27/2013 17:14     ASSESSMENT: 79 year old female with PMH relevant for CHF with LVEF 67f 30 to 35%, CAD, HTN, CKD and recurrent pleural effusions. She has a PleurX catheter on the right but now presents with a new large symptomatic left pleural effusion. Consult called to assist with thoracentesis.    RECOMMENDATIONS: - Left thoracentesis performed with no immediate complications and had significant symptomatic relief  - 2L of clear amber fluid removed and sent for micro, cell count, cytology, chemistry. - Chest X ray ordered   I have personally obtained a history, examined the patient, evaluated laboratory and imaging results, formulated the assessment and plan and placed orders.  Alyson Reedy, MD Pulmonary and Critical Care Medicine [facility] Pager: [phone]  08/04/2013, 8:26 PM

## 2013-07-13 NOTE — ED Provider Notes (Signed)
Medical screening examination/treatment/procedure(s) were conducted as a shared visit with non-physician practitioner(s) and myself.  I personally evaluated the patient during the encounter.  EKG Interpretation   None       Pt who has severe CHF with known effusions, has a pleurex drain on right already, has had recurrent effusions, symptomatic on left, was just seen, admitted and discharged, returns with SOB, hypoxia at home.  Pt is under hospice care.  Pt with sig effusion on left again.  Likely pt would need thoracentesis tonight for symptom control, then another drain placed onto left side with CT surgery for continued symptom control and comfort measures.    Impression:  Symptomatic left pleural effusion Renal insufficiency  Mild hyperkalemia   Gavin Pound. Tyniya Kuyper, MD 08/06/2013 [id number]

## 2013-07-13 NOTE — ED Provider Notes (Signed)
CSN: [id number]     Arrival date & time 07/17/2013  1603 History   First MD Initiated Contact with Patient 07/21/2013 1612     No chief complaint on file.  (Consider location/radiation/quality/duration/timing/severity/associated sxs/prior Treatment) HPI Comments: Patient is an 79 year old female with an extensive past medical history including CKD, diabetes, CAD, chronic CHF who presents with shortness of breath for the past 2 days. Symptoms started gradually and progressively worsened since the onset. Patient was seen in the ED 5 days ago with the same symptoms and was diagnosed with a pleural effusion. History is provided by the patient's daughters who are at the bedside. They report the patient has been eating and drinking less than normal due to increased breathing effort. Patient's hospice nurse recommended the patient come to the ED when she was at the house today. Patient is a DNR.    Past Medical History  Diagnosis Date  . Myocardial infarction 1995  . CHF (congestive heart failure)   . Pacemaker   . Hypertension   . High cholesterol   . Pleural effusion on right 04/27/2013    [name] 04/27/2013 (04/27/2013)  . Chronic kidney disease (CKD), stage IV (severe)     [name] 04/27/2013 (04/27/2013)  . Pneumonia 1948; 1954; 02/2013; ?04/27/2013    "real bad the first 2 times"; don't remember how bad in 02/2013; might have it now"  (04/27/2013)  . Exertional shortness of breath     "for the past week" (04/27/2013)  . Hypothyroidism   . Type II diabetes mellitus   . History of blood transfusion 2010    "w/hip replacement" (04/27/2013)  . Arthritis     "back; hands" (04/27/2013)  . Chronic lower back pain   . Gout     "several times; left ankle; fingers" (04/27/2013)   Past Surgical History  Procedure Laterality Date  . Coronary angioplasty  1995  . Coronary angioplasty with stent placement  2000    "?1" (04/27/2013)  . Abdominal wall mesh  removal  07/2007    "took the part out that was infected"  (04/27/2013)  . Total hip arthroplasty Left 2009  . Total hip arthroplasty Right 2010  . Insert / replace / remove pacemaker  ~ 2011  . Hernia repair  06/2007    "umbilical hernia repair" (04/27/2013)  . Cataract extraction w/ intraocular lens implant Right 05/2012  . Joint replacement    . Thoracentesis Right 04/28/2013   Family History  Problem Relation Age of Onset  . Other Mother   . Other Father    History  Substance Use Topics  . Smoking status: Never Smoker   . Smokeless tobacco: Never Used  . Alcohol Use: No   OB History   Grav Para Term Preterm Abortions TAB SAB Ect Mult Living                 Review of Systems  Constitutional: Positive for appetite change.  Respiratory: Positive for shortness of breath.   All other systems reviewed and are negative.    Allergies  Lisinopril; Morphine and related; Phenergan; Prednisone; and Trazodone and nefazodone  Home Medications   Current Outpatient Rx  Name  Route  Sig  Dispense  Refill  . acetaminophen (TYLENOL) 500 MG tablet   Oral   Take 1,000 mg by mouth daily as needed for pain.         Marland Kitchen allopurinol (ZYLOPRIM) 100 MG tablet   Oral   Take 100 mg by mouth every  morning.          Marland Kitchen ALPRAZolam (XANAX) 1 MG tablet   Oral   Take 1 tablet (1 mg total) by mouth at bedtime.   30 tablet   0   . aspirin EC 81 MG tablet   Oral   Take 81 mg by mouth at bedtime.         Marland Kitchen atorvastatin (LIPITOR) 40 MG tablet   Oral   Take 40 mg by mouth at bedtime.          . feeding supplement (GLUCERNA SHAKE) LIQD   Oral   Take 237 mLs by mouth 3 (three) times daily between meals.   30 Can   0   . fluticasone (FLONASE) 50 MCG/ACT nasal spray   Nasal   Place 2 sprays into the nose 2 (two) times daily as needed for rhinitis or allergies.         Marland Kitchen glyBURIDE (DIABETA) 2.5 MG tablet   Oral   Take 1.25 mg by mouth daily as needed (if sugar is over 120).         Marland Kitchen guaiFENesin (MUCINEX) 600 MG 12 hr tablet   Oral    Take 1 tablet (600 mg total) by mouth 2 (two) times daily.   60 tablet   0   . levalbuterol (XOPENEX HFA) 45 MCG/ACT inhaler   Inhalation   Inhale 1-2 puffs into the lungs 3 (three) times daily as needed for wheezing or shortness of breath.         . levothyroxine (SYNTHROID, LEVOTHROID) 125 MCG tablet   Oral   Take 125 mcg by mouth daily before breakfast.          . Menthol, Topical Analgesic, (BENGAY EX)   Apply externally   Apply 1 application topically daily as needed (hand pain).         . metoprolol succinate (TOPROL-XL) 25 MG 24 hr tablet   Oral   Take 12.5 mg by mouth daily.         . nitroGLYCERIN (NITROSTAT) 0.4 MG SL tablet   Sublingual   Place 0.4 mg under the tongue as needed for chest pain. x3 doses as needed for chest pain         . oxyCODONE (ROXICODONE INTENSOL) 100 MG/5ML concentrated solution   Oral   Take 16 mg by mouth every 4 (four) hours as needed (pain).         . pantoprazole (PROTONIX) 40 MG tablet   Oral   Take 40 mg by mouth daily.         . polyethylene glycol (MIRALAX / GLYCOLAX) packet   Oral   Take 17 g by mouth daily.   14 each   0   . sevelamer carbonate (RENVELA) 800 MG tablet   Oral   Take 800 mg by mouth 2 (two) times daily. Breakfast and dinner         . torsemide (DEMADEX) 20 MG tablet   Oral   Take 40 mg by mouth 2 (two) times daily.         . Vitamin D, Ergocalciferol, (DRISDOL) 50000 UNITS CAPS   Oral   Take 50,000 Units by mouth every Monday, Wednesday, and Friday. Approximately every other day          BP 104/43  Pulse 78  Temp(Src) 97.7 F (36.5 C) (Oral)  Resp 22  SpO2 78% Physical Exam  Nursing note and vitals reviewed. Constitutional: She is oriented  to person, place, and time. She appears well-developed and well-nourished. No distress.  HENT:  Head: Normocephalic and atraumatic.  Eyes: Conjunctivae and EOM are normal.  Neck: Normal range of motion.  Cardiovascular: Normal rate and  regular rhythm.  Exam reveals no gallop and no friction rub.   No murmur heard. Pulmonary/Chest: She has rales. She exhibits no tenderness.  Increased breathing effort. Patient with non rebreather mask. Diminished lung sounds at bilateral lung bases with associated rales.   Abdominal: Soft. She exhibits no distension. There is no tenderness. There is no rebound and no guarding.  Musculoskeletal: Normal range of motion.  Neurological: She is alert and oriented to person, place, and time. Coordination normal.  Speech is goal-oriented. Moves limbs without ataxia.   Skin: Skin is warm and dry.  Psychiatric: She has a normal mood and affect. Her behavior is normal.    ED Course  Procedures (including critical care time) Labs Review Labs Reviewed  CBC WITH DIFFERENTIAL - Abnormal; Notable for the following:    WBC 11.4 (*)    RDW 15.7 (*)    Neutrophils Relative % 87 (*)    Neutro Abs 10.0 (*)    Lymphocytes Relative 7 (*)    All other components within normal limits  BASIC METABOLIC PANEL - Abnormal; Notable for the following:    Potassium 5.8 (*)    Chloride 91 (*)    CO2 38 (*)    Glucose, Bld 220 (*)    BUN 85 (*)    Creatinine, Ser 2.70 (*)    GFR calc non Af Amer 16 (*)    GFR calc Af Amer 18 (*)    All other components within normal limits   Imaging Review Dg Chest Port 1 View  07/21/2013   CLINICAL DATA:  Follow-up pneumothorax  EXAM: PORTABLE CHEST - 1 VIEW  COMPARISON:  07/14/2013  FINDINGS: There is no pneumothorax. There is a right-sided chest tube in unchanged and satisfactory position directed towards the hilum. There are low lung volumes. There is bilateral interstitial thickening likely related to low lung volumes. There is a small left pleural effusion. Stable heart size. Dual lead cardiac pacer are noted.  IMPRESSION: Right-sided chest tube without a pneumothorax.  Bilateral interstitial thickening and small left pleural effusion concerning for mild underlying  congestive failure.   Electronically Signed   By: Elige Ko   On: 07/21/2013 09:36   Dg Chest Port 1 View  07/14/2013   CLINICAL DATA:  Left-sided pneumothorax.  EXAM: PORTABLE CHEST - 1 VIEW  COMPARISON:  07/31/2013  FINDINGS: 1423 hr. The left apical pleural line visualized on the previous study is no longer evident, compatible with interval resolution of the tiny left apical pneumothorax. There is persistent bibasilar atelectasis or infiltrate. Right chest tube remains in place. The cardio pericardial silhouette is enlarged. Left delete permanent pacemaker again noted. Telemetry leads overlie the chest.  IMPRESSION: Interval resolution of tiny left apical pneumothorax.  Cardiomegaly with bibasilar collapse/consolidation and small bilateral pleural effusions.   Electronically Signed   By: Kennith Center M.D.   On: 07/14/2013 14:30    EKG Interpretation     Ventricular Rate:  70 PR Interval:  172 QRS Duration: 169 QT Interval:  435 QTC Calculation: 469 R Axis:   -14 Text Interpretation:  Sinus rhythm Nonspecific intraventricular conduction delay Borderline T abnormalities, lateral leads ED PHYSICIAN INTERPRETATION AVAILABLE IN [facility]            MDM  1. Pleural effusion   2. Acute respiratory failure   3. ARF (acute renal failure)   4. Cardiomyopathy, ischemic   5. DM (diabetes mellitus)   6. Pleural effusion, right   7. Acute respiratory failure with hypoxia   8. Acute on chronic systolic CHF (congestive heart failure)   9. Chronic systolic CHF (congestive heart failure)   10. Cardiac pacemaker in situ   11. Encephalopathy acute     5:34 PM Chest xray shows enlargement of left pleural effusion. Labs show worsening renal function and hyperkalemia. Patient currently on 15L oxygen with non-rebreather and oxygen saturation of 97%. I will consult hospitalist for admission.     Emilia Beck, [place] 07/14/2013 2335

## 2013-07-14 ENCOUNTER — Inpatient Hospital Stay (HOSPITAL_COMMUNITY)

## 2013-07-14 ENCOUNTER — Inpatient Hospital Stay: Payer: Medicare Other | Admitting: Internal Medicine

## 2013-07-14 DIAGNOSIS — I5023 Acute on chronic systolic (congestive) heart failure: Secondary | ICD-10-CM

## 2013-07-14 DIAGNOSIS — I509 Heart failure, unspecified: Secondary | ICD-10-CM

## 2013-07-14 DIAGNOSIS — I5022 Chronic systolic (congestive) heart failure: Secondary | ICD-10-CM

## 2013-07-14 LAB — BASIC METABOLIC PANEL
BUN: 92 mg/dL — ABNORMAL HIGH (ref 6–23)
BUN: 90 mg/dL — ABNORMAL HIGH (ref 6–23)
CO2: 33 mEq/L — ABNORMAL HIGH (ref 19–32)
Calcium: 8.5 mg/dL (ref 8.4–10.5)
Calcium: 8.3 mg/dL — ABNORMAL LOW (ref 8.4–10.5)
Chloride: 88 mEq/L — ABNORMAL LOW (ref 96–112)
Chloride: 89 mEq/L — ABNORMAL LOW (ref 96–112)
Creatinine, Ser: 2.97 mg/dL — ABNORMAL HIGH (ref 0.50–1.10)
Creatinine, Ser: 3.04 mg/dL — ABNORMAL HIGH (ref 0.50–1.10)
GFR calc Af Amer: 16 mL/min — ABNORMAL LOW (ref >90)
GFR calc Af Amer: 16 mL/min — ABNORMAL LOW (ref >90)
GFR calc non Af Amer: 14 mL/min — ABNORMAL LOW (ref >90)
GFR calc non Af Amer: 14 mL/min — ABNORMAL LOW (ref >90)
Glucose, Bld: 136 mg/dL — ABNORMAL HIGH (ref 70–99)
Glucose, Bld: 143 mg/dL — ABNORMAL HIGH (ref 70–99)
Potassium: 6.5 mEq/L (ref 3.5–5.1)
Sodium: 131 mEq/L — ABNORMAL LOW (ref 135–145)

## 2013-07-14 LAB — BODY FLUID CELL COUNT WITH DIFFERENTIAL
Eos, Fluid: 14 %
Lymphs, Fluid: 8 %
Monocyte-Macrophage-Serous Fluid: 9 % — ABNORMAL LOW (ref 50–90)
Neutrophil Count, Fluid: 70 % — ABNORMAL HIGH (ref 0–25)
Total Nucleated Cell Count, Fluid: 634 cu mm (ref 0–1000)

## 2013-07-14 LAB — CBC
HCT: 39.8 % (ref 36.0–46.0)
Hemoglobin: 12 g/dL (ref 12.0–15.0)
MCH: 29.7 pg (ref 26.0–34.0)
MCHC: 30.2 g/dL (ref 30.0–36.0)
MCV: 98.5 fL (ref 78.0–100.0)
Platelets: 232 10*3/uL (ref 150–400)
RBC: 4.04 MIL/uL (ref 3.87–5.11)
RDW: 15.4 % (ref 11.5–15.5)
WBC: 11.9 10*3/uL — ABNORMAL HIGH (ref 4.0–10.5)

## 2013-07-14 LAB — BLOOD GAS, ARTERIAL
Acid-Base Excess: 10.8 mmol/L — ABNORMAL HIGH (ref 0.0–2.0)
Acid-Base Excess: 9.7 mmol/L — ABNORMAL HIGH (ref 0.0–2.0)
Bicarbonate: 38.5 mEq/L — ABNORMAL HIGH (ref 20.0–24.0)
Drawn by: 275531
FIO2: 0.5 %
O2 Saturation: 95.5 %
Patient temperature: 97.4
TCO2: 41.4 mmol/L (ref 0–100)
TCO2: 40.3 mmol/L (ref 0–100)
pCO2 arterial: 94.9 mmHg (ref 35.0–45.0)
pH, Arterial: 7.232 — ABNORMAL LOW (ref 7.350–7.450)
pO2, Arterial: 78.9 mmHg — ABNORMAL LOW (ref 80.0–100.0)

## 2013-07-14 LAB — PROTEIN, BODY FLUID

## 2013-07-14 LAB — PH, BODY FLUID: pH, Fluid: 8.5

## 2013-07-14 LAB — LACTATE DEHYDROGENASE, PLEURAL OR PERITONEAL FLUID: LD, Fluid: 142 U/L — ABNORMAL HIGH (ref 3–23)

## 2013-07-14 LAB — GLUCOSE, CAPILLARY
Glucose-Capillary: 155 mg/dL — ABNORMAL HIGH (ref 70–99)
Glucose-Capillary: 150 mg/dL — ABNORMAL HIGH (ref 70–99)

## 2013-07-14 LAB — CHOLESTEROL, BODY FLUID

## 2013-07-14 LAB — PROTEIN, TOTAL: Total Protein: 6.2 g/dL (ref 6.0–8.3)

## 2013-07-14 MED ORDER — SODIUM CHLORIDE 0.9 % IV SOLN
INTRAVENOUS | Status: DC
  Administered 2013-07-14: 12:00:00 via INTRAVENOUS

## 2013-07-14 MED ORDER — SODIUM CHLORIDE 0.9 % IV SOLN
1.0000 g | Freq: Once | INTRAVENOUS | Status: AC
  Administered 2013-07-14: 1 g via INTRAVENOUS
  Filled 2013-07-14: qty 10

## 2013-07-14 MED ORDER — SODIUM POLYSTYRENE SULFONATE 15 GM/60ML PO SUSP
30.0000 g | Freq: Once | ORAL | Status: AC
  Administered 2013-07-14: 30 g via ORAL
  Filled 2013-07-14: qty 120

## 2013-07-14 MED ORDER — SCOPOLAMINE 1 MG/3DAYS TD PT72
1.0000 | MEDICATED_PATCH | TRANSDERMAL | Status: DC
  Administered 2013-07-14: 1.5 mg via TRANSDERMAL
  Filled 2013-07-14: qty 1

## 2013-07-14 MED ORDER — INSULIN ASPART 100 UNIT/ML IV SOLN
10.0000 [IU] | Freq: Once | INTRAVENOUS | Status: AC
  Administered 2013-07-14: 10 [IU] via INTRAVENOUS

## 2013-07-14 MED ORDER — BIOTENE DRY MOUTH MT LIQD
15.0000 mL | Freq: Two times a day (BID) | OROMUCOSAL | Status: DC
  Administered 2013-07-14 (×2): 15 mL via OROMUCOSAL

## 2013-07-14 MED ORDER — DEXTROSE 50 % IV SOLN
1.0000 | Freq: Once | INTRAVENOUS | Status: AC
  Administered 2013-07-14: 50 mL via INTRAVENOUS
  Filled 2013-07-14: qty 50

## 2013-07-14 NOTE — Progress Notes (Signed)
Pt does not appear distressed. Sats 86- 88%. Non productive cough relieved with repositioning. Family aware of deteriorating lab values. They are willing to persit with kayolactate tx. Awaiting central line placement tonight.

## 2013-07-14 NOTE — Progress Notes (Signed)
Room 2C 04 Helyn Numbers - [facility] RN Visit-R.Alexsia Klindt RN  Related admission to [facility] diagnosis of systolic heart failure.  Pt is DNR code-can use cardioversion only prn.    Pt arousable, lying in bed, with no complaints of pain, on Venti mask.  2 dtrs present at bedside.  Attending-Dr. Butler Denmark, NP Revonda Standard, Staff RN present.  Dr. Butler Denmark requesting decrease in 02-attempt to get to [place],  due to high C02 levels, use IS and re-evaluate overnite to decide if Pleurix needs to be inserted in Left chest.   Patient's home medication list is on shadow chart.   Pt had large L/sided pleural effusion drained via thoracentesis last pm per CCM. Discussion as to attempting another thoracentesis today or evaluating treatments today and make determination tomorrow 11/7.    Please call [facility] @ [phone]- ask for RN Liaison or after hours,ask for on-call RN with any hospice needs.   Thank you.  Joneen Boers, RN  [facility]  Hospice Liaison  [phone])

## 2013-07-14 NOTE — Progress Notes (Signed)
Event: 07-17-2013 @ 2000:  Notified by RN that received to floor from ED lethargic w/ 02 sats in the 80's on NRB at 100%. RN repositioned pt and performed a sternal rub. 02 sats increased to 90%. There is some question regarding pt's code status. Pt arrived to ED as comfort care w/ in home Hospice. MD notes indicate pt is a DNR however RN reports pt now a partial "meds only" code. Apparently HPOA (her son) wants her to be a limited code. NP to bedside. Subjective: Pt unable to give specific information d/t lethargy and tenuous respiratory status. Objective: Miranda Cruz is a 79 y/o female w/ a past medical history of ischemic cardiac myopathy, ejection fraction of 30-35%, recurrent pleural effusions, status post right-sided pleural catheter placement, recently discharged from our service on 07/10/2013 at which time she presented with complaints of shortness of breath, found to have a large left-sided pleural effusion undergoing thoracentesis with removal of 2 L of fluid. She was d/c'd home w/ Hospice services in the home. She returned to ED today w/ worsening SOB, lethargy, minimal PO intake and overall decline since last admission. CXR revealed a large (L) sided pleural effusion. PCCM was consulted and have agreed to perform a paracentesis tonight. At bedside pt noted very lethargic though responsive to voice. Unable to auscultate BS over (L) lung, (R) side somewhat diminished but otherwise CTA. 02 sats currently 92% on NRB @ 100%. Pt is afebrile, BP-110/70, P-70, R-16. CXR on admission revealed "enlargement of now large left pleural effusion with complete opacification of the left hemithorax".  Assessment/Plan: 1. Acute on chronic hypoxic respiratory failure: associated w/ recurrent pleural effusions, currently w/ large (L) sided pleural effusion. Pt awaiting thoracentesis tonight per CCM. Given pt's current tenuous respiratory status and unclear wishes from the family regarding code status will transfer to SDU for  thoracentesis and close monitoring overnight. Plan discussed w/ family who are agreeable. I discussed plan for transfer to SDU w/ Dr Marchelle Gearing w/ [facility]. Will need to clarify code status in am. Will continue to monitor closely in SDU.   Miranda Chang, [facility] Pager [phone]

## 2013-07-14 NOTE — Consult Note (Signed)
Advanced Heart Failure Team Consult Note   Primary Cardiologist:  Miranda Cruz/Miranda Cruz  Reason for Consultation: Pleural effusion/HF  HPI:    Miranda Cruz is an 79 y/o woman with history of chronic atrial fibrillation, CAD, ischemic cardiomyopathy with end stage CHF  (EF 30%), cardiorenal syndrome and bilateral pleural effusions s/p R pleurex catheter. Now on Hospice  Recently admitted with CHF and bilateral pleural effusions. L>>>R. She had had a Pleurx catheter on the right prior to admission. Underwent L thoracentesis. Pending Pleurex placement.   Readmitted with respiratory distress. Found to have recurrent large L effusion. And is s/p thoracentesis. Feeling better. Weight back to baseline.  ABG with pco2 93. Cr up  1.7 -> 2.7-> 3.04  K = 6.5   Review of Systems: [y] = yes, [ ]  = no   General: Weight gain Cove.Etienne ]; Weight loss [ ] ; Anorexia Cove.Etienne ]; Fatigue [ y]; Fever [ ] ; Chills [ ] ; Weakness Cove.Etienne ]  Cardiac: Chest pain/pressure [ ] ; Resting SOB [ y]; Exertional SOB Cove.Etienne ]; Orthopnea [ y]; Pedal Edema [ y]; Palpitations [ ] ; Syncope [ ] ; Presyncope [ ] ; Paroxysmal nocturnal dyspnea[ ]   Pulmonary: Cough [ ] ; Wheezing[ ] ; Hemoptysis[ ] ; Sputum [ ] ; Snoring [ ]   GI: Vomiting[ ] ; Dysphagia[ ] ; Melena[ ] ; Hematochezia [ ] ; Heartburn[ ] ; Abdominal pain [ ] ; Constipation [ ] ; Diarrhea [ ] ; BRBPR [ ]   GU: Hematuria[ ] ; Dysuria [ ] ; Nocturia[ ]   Vascular: Pain in legs with walking [ ] ; Pain in feet with lying flat [ ] ; Non-healing sores [ ] ; Stroke [ ] ; TIA [ ] ; Slurred speech [ ] ;  Neuro: Headaches[ ] ; Vertigo[ ] ; Seizures[ ] ; Paresthesias[ ] ;Blurred vision [ ] ; Diplopia [ ] ; Vision changes [ ]   Ortho/Skin: Arthritis [ ] ; Joint pain Cove.Etienne ]; Muscle pain [ ] ; Joint swelling [ ] ; Back Pain [ ] ; Rash [ ]   Psych: Depression[ ] ; Anxiety[ ]   Heme: Bleeding problems [ ] ; Clotting disorders [ ] ; Anemia [ ]   Endocrine: Diabetes [ ] ; Thyroid dysfunction[ ]   Home Medications Prior to Admission medications   Medication  Sig Start Date End Date Taking? Authorizing Provider  acetaminophen (TYLENOL) 500 MG tablet Take 1,000 mg by mouth daily as needed for pain.   Yes Historical Provider, MD  allopurinol (ZYLOPRIM) 100 MG tablet Take 100 mg by mouth every morning.    Yes Historical Provider, MD  ALPRAZolam Prudy Feeler) 1 MG tablet Take 1 tablet (1 mg total) by mouth at bedtime. 06/08/13  Yes Miranda Ide, MD  aspirin EC 81 MG tablet Take 81 mg by mouth at bedtime.   Yes Historical Provider, MD  atorvastatin (LIPITOR) 40 MG tablet Take 40 mg by mouth at bedtime.    Yes Historical Provider, MD  feeding supplement (GLUCERNA SHAKE) LIQD Take 237 mLs by mouth 3 (three) times daily between meals. 06/08/13  Yes Miranda Ide, MD  fluticasone (FLONASE) 50 MCG/ACT nasal spray Place 2 sprays into the nose 2 (two) times daily as needed for rhinitis or allergies.   Yes Historical Provider, MD  glyBURIDE (DIABETA) 2.5 MG tablet Take 1.25 mg by mouth daily as needed (if sugar is over 120).   Yes Historical Provider, MD  guaiFENesin (MUCINEX) 600 MG 12 hr tablet Take 1 tablet (600 mg total) by mouth 2 (two) times daily. 02/11/13  Yes Miranda Jenna Luo, MD  levalbuterol [facility] HFA) 45 MCG/ACT inhaler Inhale 1-2 puffs into the lungs 3 (three) times daily as needed for wheezing or  shortness of breath. 02/11/13  Yes Miranda Jenna Luo, MD  levothyroxine (SYNTHROID, LEVOTHROID) 125 MCG tablet Take 125 mcg by mouth daily before breakfast.    Yes Historical Provider, MD  Menthol, Topical Analgesic, (BENGAY EX) Apply 1 application topically daily as needed (hand pain).   Yes Historical Provider, MD  metoprolol succinate (TOPROL-XL) 25 MG 24 hr tablet Take 12.5 mg by mouth daily.   Yes Historical Provider, MD  nitroGLYCERIN (NITROSTAT) 0.4 MG SL tablet Place 0.4 mg under the tongue as needed for chest pain. x3 doses as needed for chest pain   Yes Historical Provider, MD  oxyCODONE (ROXICODONE INTENSOL) 100 MG/5ML concentrated solution Take 16 mg by mouth every  4 (four) hours as needed (pain).   Yes Historical Provider, MD  pantoprazole (PROTONIX) 40 MG tablet Take 40 mg by mouth daily.   Yes Historical Provider, MD  polyethylene glycol (MIRALAX / GLYCOLAX) packet Take 17 g by mouth daily. 06/08/13  Yes Miranda Ide, MD  sevelamer carbonate (RENVELA) 800 MG tablet Take 800 mg by mouth 2 (two) times daily. Breakfast and dinner   Yes Historical Provider, MD  torsemide (DEMADEX) 20 MG tablet Take 40 mg by mouth 2 (two) times daily.   Yes Historical Provider, MD  Vitamin D, Ergocalciferol, (DRISDOL) 50000 UNITS CAPS Take 50,000 Units by mouth every Monday, Wednesday, and Friday. Approximately every other day   Yes Historical Provider, MD    Past Medical History: Past Medical History  Diagnosis Date  . Myocardial infarction 1995  . CHF (congestive heart failure)   . Pacemaker   . Hypertension   . High cholesterol   . Pleural effusion on right 04/27/2013    Miranda Cruz 04/27/2013 (04/27/2013)  . Chronic kidney disease (CKD), stage IV (severe)     Miranda Cruz 04/27/2013 (04/27/2013)  . Pneumonia 1948; 1954; 02/2013; ?04/27/2013    "real bad the first 2 times"; don't remember how bad in 02/2013; might have it now"  (04/27/2013)  . Exertional shortness of breath     "for the past week" (04/27/2013)  . Hypothyroidism   . Type II diabetes mellitus   . History of blood transfusion 2010    "w/hip replacement" (04/27/2013)  . Arthritis     "back; hands" (04/27/2013)  . Chronic lower back pain   . Gout     "several times; left ankle; fingers" (04/27/2013)    Past Surgical History: Past Surgical History  Procedure Laterality Date  . Coronary angioplasty  1995  . Coronary angioplasty with stent placement  2000    "?1" (04/27/2013)  . Abdominal wall mesh  removal  07/2007    "took the part out that was infected" (04/27/2013)  . Total hip arthroplasty Left 2009  . Total hip arthroplasty Right 2010  . Insert / replace / remove pacemaker  ~ 2011  . Hernia repair  06/2007     "umbilical hernia repair" (04/27/2013)  . Cataract extraction w/ intraocular lens implant Right 05/2012  . Joint replacement    . Thoracentesis Right 04/28/2013    Family History: Family History  Problem Relation Age of Onset  . Other Mother   . Other Father     Social History: History   Social History  . Marital Status: Widowed    Spouse Name: N/A    Number of Children: N/A  . Years of Education: N/A   Social History Main Topics  . Smoking status: Never Smoker   . Smokeless tobacco: Never Used  . Alcohol Use:  No  . Drug Use: No  . Sexual Activity: No   Other Topics Concern  . None   Social History Narrative  . None    Allergies:  Allergies  Allergen Reactions  . Lisinopril     Informed by patient and family that medication causes kidney failure in patient and during last hospitalization was told by her cardiologist not to take this med.  . Morphine And Related Other (See Comments)    Made her loopy  . Phenergan [Promethazine Hcl] Other (See Comments)    Made her crazy  . Prednisone Other (See Comments)    Confusion, bad dreams, insomnia  . Trazodone And Nefazodone Other (See Comments)    hallucinations     Objective:    Vital Signs:   Temp:  [97 F (36.1 C)-98.2 F (36.8 C)] 98.2 F (36.8 C) (11/07 1300) Pulse Rate:  [69-78] 70 (11/07 1300) Resp:  [10-22] 17 (11/07 1300) BP: (95-120)/(42-89) 120/47 mmHg (11/07 1300) SpO2:  [78 %-100 %] 95 % (11/07 1300) FiO2 (%):  [50 %] 50 % (11/07 1300) Weight:  [75 kg (165 lb 5.5 oz)-78.2 kg (172 lb 6.4 oz)] 75 kg (165 lb 5.5 oz) (11/07 0500) Last BM Date: 07/12/13  Weight change: Filed Weights   08/09/2013 2020 Aug 09, 2013 2104 07/14/13 0500  Weight: 78.2 kg (172 lb 6.4 oz) 77.1 kg (169 lb 15.6 oz) 75 kg (165 lb 5.5 oz)    Intake/Output:   Intake/Output Summary (Last 24 hours) at 07/14/13 1410 Last data filed at 07/14/13 1200  Gross per 24 hour  Intake     60 ml  Output      0 ml  Net     60 ml      Physical Exam: General: chronically ill-appearing; lethargic but arouses and can converse a bit Neck: hard to see JVD. Does not appear overly elevated no thyromegaly or thyroid nodule.  Lungs: Diffuse rhonchi anteriorly CV: Nondisplaced PMI. Heart irregular S1/S2, no S3/S4, 3/6 SEM LSB. tr bilateral ankle edema.Abdomen: Soft, nontender, no hepatosplenomegaly, no distention.  Skin: Intact without lesions or rashes.  Neurologic: moves all 4 spontaneously  Psych: lethargic Extremities: No clubbing or cyanosis.   Telemetry: AF  Labs: Basic Metabolic Panel:  Recent Labs Lab 07/09/13 0616 07/10/13 0730 2013/08/09 1629 07/14/13 0530 07/14/13 0810  NA 142 134* 136 131* 133*  K 5.1 4.5 5.8* NOT DONE 6.5*  CL 99 92* 91* 88* 89*  CO2 36* 31 38* 33* 33*  GLUCOSE 118* 152* 220* 136* 143*  BUN 55* 66* 85* 92* 90*  CREATININE 1.52* 1.67* 2.70* 2.97* 3.04*  CALCIUM 8.9 8.5 9.0 8.5 8.3*    Liver Function Tests:  Recent Labs Lab 07/08/13 1558 07/09/13 0616 Aug 09, 2013 2230  AST 29 36  --   ALT 8 9  --   ALKPHOS 118* 101  --   BILITOT 0.5 0.6  --   PROT 6.8 6.0 6.2  ALBUMIN 2.8* 2.3*  --    No results found for this basename: LIPASE, AMYLASE,  in the last 168 hours No results found for this basename: AMMONIA,  in the last 168 hours  CBC:  Recent Labs Lab 07/08/13 1558 07/09/13 0616 09-Aug-2013 1629 07/14/13 0530  WBC 9.3 6.5 11.4* 11.9*  NEUTROABS  --   --  10.0*  --   HGB 12.2 12.3 12.8 12.0  HCT 40.5 40.5 42.7 39.8  MCV 99.0 97.4 98.2 98.5  PLT 186 153 242 232    Cardiac Enzymes:  Recent Labs Lab 07/08/13 1558  TROPONINI <0.30    BNP: BNP (last 3 results)  Recent Labs  05/29/13 2056 05/30/13 1424 07/08/13 1558  PROBNP 9064.0* 8898.0* 7455.0*    CBG:  Recent Labs Lab 07-22-13 2157 07/14/13 0755 07/14/13 1315  GLUCAP 173* 155* 150*    Coagulation Studies: No results found for this basename: LABPROT, INR,  in the last 72 hours  Other  results:   Imaging: Dg Chest 2 View  07/22/13   CLINICAL DATA:  Shortness of breath, cough, congestion  EXAM: CHEST  2 VIEW  COMPARISON:  07/10/2013  FINDINGS: There is now complete opacification of the left hemi thorax with interval increase in the previously seen pleural effusion, with complete obscuration of the underlying left lung. Moderate right pleural effusion is reidentified with obscuration of the underlying lung as well. This is stable. Heart size is not accurately assessed. Dual lead left-sided pacer in place. No acute osseous abnormality.  IMPRESSION: Enlargement of now large left pleural effusion with complete opacification of the left hemi thorax.   Electronically Signed   By: Christiana Pellant M.D.   On: 07/22/2013 17:14   Dg Chest Port 1 View  07/22/13   CLINICAL DATA:  82-year- female status post thoracentesis. Initial encounter.  EXAM: PORTABLE CHEST - 1 VIEW  COMPARISON:  1659 hr the same day and earlier.  FINDINGS: Portable AP semi upright view at at 2205 hrs. Markedly reduced left pleural effusion. Cardiac and mediastinal contours now visible. Left lung ventilation appears near normal. Cardiomegaly. Left chest cardiac pacemaker. Difficult to exclude a small left apical pneumothorax on this image (arrow).  Right chest tube remains in place. Right lung base pleural effusion and/or airspace opacity is stable.  IMPRESSION: 1. Markedly decreased left pleural effusion status post thoracentesis. Difficult to exclude a tiny left apical pneumothorax.  2. Stable right lung.   Electronically Signed   By: Augusto Gamble M.D.   On: July 22, 2013 22:32      Medications:     Current Medications: . allopurinol  100 mg Oral q morning - 10a  . ALPRAZolam  1 mg Oral QHS  . antiseptic oral rinse  15 mL Mouth Rinse BID  . aspirin EC  81 mg Oral QHS  . atorvastatin  40 mg Oral QHS  . calcium gluconate  1 g Intravenous Once  . dextrose  0.5 ampule Intravenous Once  . dextrose  1 ampule Intravenous  Once  . feeding supplement (GLUCERNA SHAKE)  237 mL Oral TID BM  . heparin  5,000 Units Subcutaneous Q8H  . insulin aspart  0-15 Units Subcutaneous TID WC  . insulin aspart  0-5 Units Subcutaneous QHS  . insulin aspart  10 Units Intravenous Once  . insulin aspart  10 Units Intravenous Once  . levothyroxine  125 mcg Oral QAC breakfast  . metoprolol succinate  12.5 mg Oral Daily  . pantoprazole  40 mg Oral Daily  . polyethylene glycol  17 g Oral Daily  . sevelamer carbonate  800 mg Oral BID WC  . sodium chloride  3 mL Intravenous Q12H  . sodium polystyrene  30 g Oral Once  . torsemide  40 mg Oral BID     Infusions: . sodium chloride 50 mL/hr at 07/14/13 1145      Assessment:   1. Acute on chronic hypercarbic respiratory failure 2. Recurrent bilateral pleural effusions 3. Acute on chronic renal failure, stage IV 4. Chronic atrial fibrillation  5. CAD 6. Hyperkalemia 7.  DNR/DNI   Plan/Discussion:    She has become progressively ill over past few months and now I feel she is truly end-stage and may be uncomfortable at times.   I discussed the situation with two of her daughters at length and we discussed options of pushing on with current aggressive therapy + possible Pleurex cath on Monday versus making her comfortable with morphine gtt.  They are very realistic about her situation and realize how sick she is but they are also hopeful that they can get her a few more good weeks.   I told them we will continue to follow closely and if she deteriorates we can switch to comfort care at any point. Volume status currently ok.   I have written for repeat BMET and ABG later this afternoon.   We will follow.  Miranda Cruz 3:56 PM Advanced Heart Failure Team Pager [phone] (M-F; 7a - 4p)  Please contact [facility] for night-coverage after hours (4p -7a ) and weekends on [email or web address]

## 2013-07-14 NOTE — Progress Notes (Signed)
 [facility] Progress Note [place] TEAM 1 - Stepdown ICU Team   Miranda Cruz ZHY:[id number] DOB: 1930/06/25 DOA: 07/17/2013 PCP: Hoyle Sauer, MD  Brief narrative: 79 y.o. female with a past medical history of ischemic cardiomyopathy (ejection fraction of 30-35%),  recurrent pleural effusions, status post right-sided pleural catheter placement. She was recently discharged from our service on 07/10/2013 after presenting with complaints of shortness of breath. She was found to have a large left-sided pleural effusion and subsequently underwent thoracentesis with removal of 2 L of fluid. She was discharged home in stable condition with home hospice services and oxygen.   She returned to the [facility] apartment with worsening shortness of breath, progressive lethargy, minimal by mouth intake, with an overall steep functional decline since her recent hospitalization. Family members did not report fevers, chills, nausea vomiting, chest pain or focal deficits. She was evaluated by hospice nurse prior to arrival and was found to have significant diminished breath sounds on left lung side. She was referred to the emergency department for further evaluation. She was found to be hypoxemic upon presentation with an O2 sat of 60% and was placed on a 100% nonrebreather. A chest x-ray showed re-accumulation of prior large left pleural effusion. Admitting MD discussed the case with pulmonary critical care medicine. Plans were to proceed with a therapeutic thoracentesis. Also consideration for placement of left-sided Pleurx catheter this admission. Patient unable to participate in her primary care or provide history. History was obtained from emergency room staff and family members who were present at bedside    Assessment/Plan: Active Problems:   ARF (acute renal failure)/CKD (chronic kidney disease) stage 4, GFR 15-29 ml/min/hyperkalemia -progressive and likely due to attempts to diureses effusions -Lasix  dc'd-GFR poor  Hyperkalemia -K up and pt looks dry and has not been eating so will give temporizing meds, very gentle IVFs (not eating or drinking) and Kayexalate    Acute respiratory failure with hypoxia and hypercapnea/Recurrent Pleural effusion, left -CXR markedly better post thoracentesis but suspect effusion will re-accumulate  -IR does not place PleurX tubes so if pt/family desire to pursue (after d/w Pulmonary and Cardiology) will need to consult TCTS -EOL issues d/w family -decrease O2 from 100% NRB to VM- goal PUlse ox is 90% - pulmonary to follow today    DM (diabetes mellitus) -CBG stable -cont SSI    Cardiomyopathy, ischemic/Chronic systolic CHF (congestive heart failure) -per Cards    Encephalopathy acute -due to hypercapnea and to some extent azotemia and hypoxia    Severe protein-calorie malnutrition -EOL/FTT -allow food from home   DVT prophylaxis: Subcutaneous heparin Code Status: Partial code: No CPR, no defibrillation but okay with cardioversion, okay with antiarrhythmic drugs and vasoactive drugs Family Communication: Patient and 2 daughters at bedside Disposition Plan/Expected LOS: Step down Isolation: None Nutritional Status: Acute on chronic protein calorie malnutrition related to underlying severe cardiorespiratory dizzy  Consultants: Pulmonary Cardiology Outpatient hospice  Procedures: Left thoracentesis  11/6  Antibiotics: None  HPI/Subjective: Patient awake, surprisingly, despite PCO2 93! Occasionally making jokes. Family at bedside. Patient denies at this time shortness of breath, chest pain or other issues  Objective: Blood pressure 104/46, pulse 70, temperature 97.4 F (36.3 C), temperature source Axillary, resp. rate 18, height 5\' 2"  (1.575 m), weight 165 lb 5.5 oz (75 kg), SpO2 97.00%.  Intake/Output Summary (Last 24 hours) at 07/14/13 1119 Last data filed at 07/14/13 1000  Gross per 24 hour  Intake      0 ml  Output  0 ml    Net      0 ml     Exam: General: Minimal respiratory distress as evidenced by tachypnea with movement and mild use of accessory muscles occasionally with pre- Lungs: Coarse to auscultation bilaterally without wheezes or crackles, 100 % NRB-wet sounding cough Cardiovascular: Regular rate and ventricular rhythm without murmur gallop or rub normal S1 and S2, no peripheral edema or JVD Abdomen: Nontender, nondistended, soft, bowel sounds positive, no rebound, no ascites, no appreciable mass Musculoskeletal: No significant cyanosis, clubbing of bilateral lower extremities Neurological: Awake and oriented at least to name and place, weakly moves all extremities x 4 without focal neurological deficits, CN 2-12 intact  Scheduled Meds:  Scheduled Meds: . allopurinol  100 mg Oral q morning - 10a  . ALPRAZolam  1 mg Oral QHS  . antiseptic oral rinse  15 mL Mouth Rinse BID  . aspirin EC  81 mg Oral QHS  . atorvastatin  40 mg Oral QHS  . dextrose  0.5 ampule Intravenous Once  . feeding supplement (GLUCERNA SHAKE)  237 mL Oral TID BM  . heparin  5,000 Units Subcutaneous Q8H  . insulin aspart  0-15 Units Subcutaneous TID WC  . insulin aspart  0-5 Units Subcutaneous QHS  . insulin aspart  10 Units Intravenous Once  . levothyroxine  125 mcg Oral QAC breakfast  . metoprolol succinate  12.5 mg Oral Daily  . pantoprazole  40 mg Oral Daily  . polyethylene glycol  17 g Oral Daily  . sevelamer carbonate  800 mg Oral BID WC  . sodium chloride  3 mL Intravenous Q12H  . torsemide  40 mg Oral BID   Continuous Infusions:   **Reviewed in detail by the Attending Physician  Data Reviewed: Basic Metabolic Panel:  Recent Labs Lab 07/09/13 0616 07/10/13 0730 07/11/2013 1629 07/14/13 0530 07/14/13 0810  NA 142 134* 136 131* 133*  K 5.1 4.5 5.8* NOT DONE 6.5*  CL 99 92* 91* 88* 89*  CO2 36* 31 38* 33* 33*  GLUCOSE 118* 152* 220* 136* 143*  BUN 55* 66* 85* 92* 90*  CREATININE 1.52* 1.67* 2.70* 2.97*  3.04*  CALCIUM 8.9 8.5 9.0 8.5 8.3*   Liver Function Tests:  Recent Labs Lab 07/08/13 1558 07/09/13 0616  2230  AST 29 36  --   ALT 8 9  --   ALKPHOS 118* 101  --   BILITOT 0.5 0.6  --   PROT 6.8 6.0 6.2  ALBUMIN 2.8* 2.3*  --    No results found for this basename: LIPASE, AMYLASE,  in the last 168 hours No results found for this basename: AMMONIA,  in the last 168 hours CBC:  Recent Labs Lab 07/08/13 1558 07/09/13 0616 07/22/2013 1629 07/14/13 0530  WBC 9.3 6.5 11.4* 11.9*  NEUTROABS  --   --  10.0*  --   HGB 12.2 12.3 12.8 12.0  HCT 40.5 40.5 42.7 39.8  MCV 99.0 97.4 98.2 98.5  PLT 186 153 242 232   Cardiac Enzymes:  Recent Labs Lab 07/08/13 1558  TROPONINI <0.30   BNP (last 3 results)  Recent Labs  05/29/13 2056 05/30/13 1424 07/08/13 1558  PROBNP 9064.0* 8898.0* 7455.0*   CBG:  Recent Labs Lab 08/05/2013 2157  GLUCAP 173*    Recent Results (from the past 240 hour(s))  BODY FLUID CULTURE     Status: None   Collection Time    07/08/13  8:15 PM      Result  Value Range Status   Specimen Description THORACENTESIS LEFT FLUID   Final   Special Requests NONE   Final   Gram Stain     Final   Value: RARE WBC NO ORGANISMS SEEN     CYTOSPIN     Performed at [facility]   Culture     Final   Value: NO GROWTH 3 DAYS     Performed at [facility]   Report Status 07/12/2013 FINAL   Final  MRSA PCR SCREENING     Status: None   Collection Time      9:21 PM      Result Value Range Status   MRSA by PCR NEGATIVE  NEGATIVE Final   Comment:            The GeneXpert MRSA Assay (FDA     approved for NASAL specimens     only), is one component of a     comprehensive MRSA colonization     surveillance program. It is not     intended to diagnose MRSA     infection nor to guide or     monitor treatment for     MRSA infections.  BODY FLUID CULTURE     Status: None   Collection Time    08/02/2013 11:52 PM      Result Value  Range Status   Specimen Description PLEURAL FLUID   Final   Special Requests NONE   Final   Gram Stain     Final   Value: FEW WBC PRESENT,BOTH PMN AND MONONUCLEAR     NO ORGANISMS SEEN     Performed at [facility]   Culture PENDING   Incomplete   Report Status PENDING   Incomplete     Studies:  Recent x-ray studies have been reviewed in detail by the Attending Physician     Junious Silk, ANP [facility] Office  [phone] Pager [phone]  **If unable to reach the above provider after paging please contact the Flow Manager @ [phone]  On-Call/Text Page:      [email or web address]      password [email or web address]  If 7PM-7AM, please contact night-coverage [email or web address] Password [email or web address] 07/14/2013, 11:19 AM   LOS: 1 day   I have examined the patient, reviewed the chart and modified the above note which I agree with.   Farris Blash,MD [phone] 07/14/2013, 1:31 PM

## 2013-07-14 NOTE — Progress Notes (Addendum)
PULMONARY  / CRITICAL CARE MEDICINE  Name: Miranda Cruz MRN: [id number] DOB: [birth year]    ADMISSION DATE:  07/19/2013 CONSULTATION DATE:  07/08/13  REFERRING MD :  [facility] PRIMARY SERVICE: [facility]  CHIEF COMPLAINT:  SOB  BRIEF PATIENT DESCRIPTION: 79 year old female with PMH relevant for CHF with LVEF 60f 30 to 35%, CAD, HTN, CKD and recurrent pleural effusions. She has a PleurX catheter on the right but now presents with a new large symptomatic left pleural effusion. Consult called to assist with thoracentesis.   SIGNIFICANT EVENTS / STUDIES:   LINES / TUBES: R tunneled pleural cath ??? >>>  CULTURES:  ANTIBIOTICS:  SUBJECTIVE:   VITAL SIGNS: Temp:  [97 F (36.1 C)-98.2 F (36.8 C)] 98.2 F (36.8 C) (11/07 1300) Pulse Rate:  [69-78] 70 (11/07 1300) Resp:  [10-22] 17 (11/07 1300) BP: (95-120)/(42-89) 120/47 mmHg (11/07 1300) SpO2:  [78 %-100 %] 95 % (11/07 1300) FiO2 (%):  [50 %] 50 % (11/07 1300) Weight:  [75 kg (165 lb 5.5 oz)-78.2 kg (172 lb 6.4 oz)] 75 kg (165 lb 5.5 oz) (11/07 0500)  PHYSICAL EXAMINATION: General:elderly chronically ill appearing female, not currently in acute distress . Eyes: Anicteric sclerae. ENT: Oropharynx clear. Moist mucous membranes. No thrush Lymph: No cervical, supraclavicular, or axillary lymphadenopathy. Heart: Normal S1, S2. No murmurs, rubs, or gallops appreciated. No bruits, equal pulses. Lungs:Right PleurX catheter in place.scattered rhonchi, decreased on left Abdomen: Abdomen soft, non-tender and not distended, normoactive bowel sounds. No hepatosplenomegaly or masses. Musculoskeletal: No clubbing or synovitis. Mild 1+ LE edema. Skin: No rashes or lesions Neuro: No focal neurologic deficits.   Recent Labs Lab 07/08/13 1558 07/09/13 0616 07/10/13 0730 07/09/2013 1629 07/27/2013 2230 07/14/13 0530 07/14/13 0745 07/14/13 0810  HGB 12.2 12.3  --  12.8  --  12.0  --   --   WBC 9.3 6.5  --  11.4*  --  11.9*  --   --   PLT 186 153  --  242   --  232  --   --   NA 137 142 134* 136  --  131*  --  133*  K 5.5* 5.1 4.5 5.8*  --  NOT DONE  --  6.5*  CL 95* 99 92* 91*  --  88*  --  89*  CO2 36* 36* 31 38*  --  33*  --  33*  GLUCOSE 224* 118* 152* 220*  --  136*  --  143*  BUN 54* 55* 66* 85*  --  92*  --  90*  CREATININE 1.59* 1.52* 1.67* 2.70*  --  2.97*  --  3.04*  CALCIUM 9.0 8.9 8.5 9.0  --  8.5  --  8.3*  AST 29 36  --   --   --   --   --   --   ALT 8 9  --   --   --   --   --   --   ALKPHOS 118* 101  --   --   --   --   --   --   BILITOT 0.5 0.6  --   --   --   --   --   --   PROT 6.8 6.0  --   --  6.2  --   --   --   ALBUMIN 2.8* 2.3*  --   --   --   --   --   --   TROPONINI <  0.30  --   --   --   --   --   --   --   PHART  --   --   --   --   --   --  7.232*  --   PCO2ART  --   --   --   --   --   --  93.7*  --   PO2ART  --   --   --   --   --   --  117.0*  --   HCO3  --   --   --   --   --   --  38.5*  --   O2SAT  --   --   --   --   --   --  99.0  --    Pleural fluid analysis results:  Site:  Date:   Pleural fluid Serum   LDH: 142 LDH:223   (0.64 ratio)  Protein: 2.7 Protein: 6.2 (0.43 ratio)  Cholesterol: 32 Cholesterol:90   Afb:   Gm stain:    Culture:   Fungal:    Hct:    Color: yellow   Wbc: 634   Neutrophils: 70   Lymph:    Ph:    Rheumatoid factor:   Adenosine Deaminase:   Glucose:    Cytology:       Special notes:      PCXR:  11/6 >>> Improved aeration on left, small residual apical ptx   ASSESSMENT AND PLAN:.  Acute on chronic combined congestive heart failure Recurrent bilateral pleural effusions - transsudative S/p R tunneled pleural catheter placement Acute on chronic respiratory failure Small apical pneumothorax after thoracentesis on the left  -->  Continue diuresis -->  F/u CXR to document resolution of pneumothorax -->  Recommend tunneled pleural catheter placement on the left by IR (it has been requested)  I have personally obtained history, examined patient, evaluated and  interpreted laboratory and imaging results, reviewed medical records, formulated assessment / plan and placed orders.  Lonia Farber, MD Pulmonary and Critical Care Medicine [facility] Pager: [phone]  07/14/2013, 2:02 PM

## 2013-07-14 NOTE — Discharge Summary (Deleted)
 PULMONARY  / CRITICAL CARE MEDICINE  Name: Miranda Cruz MRN: [id number] DOB: January 04, 1930    ADMISSION DATE:  08/05/2013 CONSULTATION DATE:  07/08/13  REFERRING MD :  Lambert Keto, MD PRIMARY SERVICE: [facility]  CHIEF COMPLAINT:  SOB  BRIEF PATIENT DESCRIPTION:  79 year old female with PMH relevant for CHF with LVEF 34f 30 to 35%, CAD, HTN, CKD and recurrent pleural effusions. She has a PleurX catheter on the right but now presents with a new large symptomatic left pleural effusion. Consult called to assist with thoracentesis.   SIGNIFICANT EVENTS / STUDIES:  Chest X ray with new large left pleural effusion. Site:  Date:   Pleural fluid Serum   LDH: 142 LDH:223   (0.64 ratio)  Protein: 2.7 Protein: 6.2 (0.43 ratio)  Cholesterol: 32 Cholesterol:90   Afb:   Gm stain:    Culture:   Fungal:    Hct:    Color: yellow   Wbc: 634   Neutrophils: 70   Lymph:    Ph:    Rheumatoid factor:   Adenosine Deaminase:   Glucose:    Cytology:       Special notes:       LINES / TUBES: Peripheral IV's R pleurex catheter date unknown  CULTURES: No cultures  ANTIBIOTICS: No antibiotics  SUBJECTIVE:   VITAL SIGNS: Temp:  [97 F (36.1 C)-97.7 F (36.5 C)] 97.4 F (36.3 C) (11/06 2331) Pulse Rate:  [69-78] 70 (11/07 0756) Resp:  [10-22] 18 (11/07 0756) BP: (95-114)/(42-89) 104/46 mmHg (11/07 0756) SpO2:  [78 %-100 %] 97 % (11/07 0919) FiO2 (%):  [50 %] 50 % (11/07 0919) Weight:  [75 kg (165 lb 5.5 oz)-78.2 kg (172 lb 6.4 oz)] 75 kg (165 lb 5.5 oz) (11/07 0500) 4 liters  HEMODYNAMICS:   VENTILATOR SETTINGS: Vent Mode:  [-]  FiO2 (%):  [50 %] 50 % INTAKE / OUTPUT: Intake/Output     11/06 0701 - 11/07 0700 11/07 0701 - 11/08 0700   P.O.  60   Total Intake(mL/kg)  60 (0.8)   Net   +60        Urine Occurrence 1 x      PHYSICAL EXAMINATION: General:elderly chronically ill appearing female, not currently in acute distress . Eyes: Anicteric sclerae. ENT: Oropharynx  clear. Moist mucous membranes. No thrush Lymph: No cervical, supraclavicular, or axillary lymphadenopathy. Heart: Normal S1, S2. No murmurs, rubs, or gallops appreciated. No bruits, equal pulses. Lungs:Right PleurX catheter in place.scattered rhonchi, decreased on left Abdomen: Abdomen soft, non-tender and not distended, normoactive bowel sounds. No hepatosplenomegaly or masses. Musculoskeletal: No clubbing or synovitis. Mild 1+ LE edema. Skin: No rashes or lesions Neuro: No focal neurologic deficits.   LABS:  CBC Recent Labs     07/28/2013  1629  07/14/13  0530  WBC  11.4*  11.9*  HGB  12.8  12.0  HCT  42.7  39.8  PLT  242  232   Coag's No results found for this basename: APTT, INR,  in the last 72 hours BMET Recent Labs     08/01/2013  1629  07/14/13  0530  07/14/13  0810  NA  136  131*  133*  K  5.8*  NOT DONE  6.5*  CL  91*  88*  89*  CO2  38*  33*  33*  BUN  85*  92*  90*  CREATININE  2.70*  2.97*  3.04*  GLUCOSE  220*  136*  143*  Electrolytes Recent Labs     07/29/2013  1629  07/14/13  0530  07/14/13  0810  CALCIUM  9.0  8.5  8.3*   Sepsis Markers No results found for this basename: LACTICACIDVEN, PROCALCITON, O2SATVEN,  in the last 72 hours ABG Recent Labs     07/14/13  0745  PHART  7.232*  PCO2ART  93.7*  PO2ART  117.0*   Liver Enzymes No results found for this basename: AST, ALT, ALKPHOS, BILITOT, ALBUMIN,  in the last 72 hours Cardiac Enzymes No results found for this basename: TROPONINI, PROBNP,  in the last 72 hours Glucose Recent Labs       2157  07/14/13  0755  GLUCAP  173*  155*    Imaging Dg Chest 2 View  07/12/2013   CLINICAL DATA:  Shortness of breath, cough, congestion  EXAM: CHEST  2 VIEW  COMPARISON:  07/10/2013  FINDINGS: There is now complete opacification of the left hemi thorax with interval increase in the previously seen pleural effusion, with complete obscuration of the underlying left lung. Moderate right pleural  effusion is reidentified with obscuration of the underlying lung as well. This is stable. Heart size is not accurately assessed. Dual lead left-sided pacer in place. No acute osseous abnormality.  IMPRESSION: Enlargement of now large left pleural effusion with complete opacification of the left hemi thorax.   Electronically Signed   By: Christiana Pellant M.D.   On: 07/08/2013 17:14   Dg Chest Port 1 View  07/09/2013   CLINICAL DATA:  82-year- female status post thoracentesis. Initial encounter.  EXAM: PORTABLE CHEST - 1 VIEW  COMPARISON:  1659 hr the same day and earlier.  FINDINGS: Portable AP semi upright view at at 2205 hrs. Markedly reduced left pleural effusion. Cardiac and mediastinal contours now visible. Left lung ventilation appears near normal. Cardiomegaly. Left chest cardiac pacemaker. Difficult to exclude a small left apical pneumothorax on this image (arrow).  Right chest tube remains in place. Right lung base pleural effusion and/or airspace opacity is stable.  IMPRESSION: 1. Markedly decreased left pleural effusion status post thoracentesis. Difficult to exclude a tiny left apical pneumothorax.  2. Stable right lung.   Electronically Signed   By: Augusto Gamble M.D.   On: 07/26/2013 22:32     ASSESSMENT:.  Recurrent left effusion s/p thora 11/6. Fluid analysis c/w transudate. This is likely a result of her HF with LVEF 19f 30 to 35%, CAD, HTN, CKD, just like the effusion on the right.  >Now s/p left thoracentesis w/ improved aeration, but very small apical PTX on left. This will likely re-occur due to the end-stage nature of her heart failure.  Small apical left PTX   RECOMMENDATIONS: - continue diuresis  - f/u cxr to be sure PTX not enlarged - when fluid re-collects will need palliative pleur-x on left    I have personally obtained a history, examined the patient, evaluated laboratory and imaging results, formulated the assessment and plan and placed orders.    07/14/2013, 1:12  PM

## 2013-07-14 NOTE — Consult Note (Signed)
PULMONARY  / CRITICAL CARE MEDICINE  Name: Miranda Cruz MRN: [id number] DOB: Jan 13, 1930    ADMISSION DATE:  08/04/2013 CONSULTATION DATE:  07/14/2013   REFERRING MD :  Calvert Cantor, MD PRIMARY SERVICE: [facility]  CHIEF COMPLAINT:  Need IV access  BRIEF PATIENT DESCRIPTION/History of Present Illness: 79 yo female with PMH of CHF with LVEF 30-35%, HTN, CKD and recurrent pleural effusions who was initially admitted with worsening shortness of breath and lethargy. She was found to have a CXR with a large L pleural effusion s/p thoracentesis 07/24/2013 with some symptomatic improvement but continued hypoxia and worsening hypercarbia. During this admission, she has had worsening acute on chronic renal failure and worsening hyperkalemia which is being medically managed. We are now consulted evaluation for central venous catheter placement in the setting of difficulty with peripheral IV access.  SIGNIFICANT EVENTS / STUDIES:  Thoracentesis 07/25/2013 CXR 07/14/2013 interval resolution of tiny L apica PTX, small bilateral pleural effusions   LINES / TUBES: R peripheral IV R pleurex catheter date unknown   PAST MEDICAL HISTORY :  Past Medical History  Diagnosis Date  . Myocardial infarction 1995  . CHF (congestive heart failure)   . Pacemaker   . Hypertension   . High cholesterol   . Pleural effusion on right 04/27/2013    [name] 04/27/2013 (04/27/2013)  . Chronic kidney disease (CKD), stage IV (severe)     [name] 04/27/2013 (04/27/2013)  . Pneumonia 1948; 1954; 02/2013; ?04/27/2013    "real bad the first 2 times"; don't remember how bad in 02/2013; might have it now"  (04/27/2013)  . Exertional shortness of breath     "for the past week" (04/27/2013)  . Hypothyroidism   . Type II diabetes mellitus   . History of blood transfusion 2010    "w/hip replacement" (04/27/2013)  . Arthritis     "back; hands" (04/27/2013)  . Chronic lower back pain   . Gout     "several times; left ankle; fingers"  (04/27/2013)   Past Surgical History  Procedure Laterality Date  . Coronary angioplasty  1995  . Coronary angioplasty with stent placement  2000    "?1" (04/27/2013)  . Abdominal wall mesh  removal  07/2007    "took the part out that was infected" (04/27/2013)  . Total hip arthroplasty Left 2009  . Total hip arthroplasty Right 2010  . Insert / replace / remove pacemaker  ~ 2011  . Hernia repair  06/2007    "umbilical hernia repair" (04/27/2013)  . Cataract extraction w/ intraocular lens implant Right 05/2012  . Joint replacement    . Thoracentesis Right 04/28/2013   Prior to Admission medications   Medication Sig Start Date End Date Taking? Authorizing Provider  acetaminophen (TYLENOL) 500 MG tablet Take 1,000 mg by mouth daily as needed for pain.   Yes Historical Provider, MD  allopurinol (ZYLOPRIM) 100 MG tablet Take 100 mg by mouth every morning.    Yes Historical Provider, MD  ALPRAZolam Prudy Feeler) 1 MG tablet Take 1 tablet (1 mg total) by mouth at bedtime. 06/08/13  Yes Meredeth Ide, MD  aspirin EC 81 MG tablet Take 81 mg by mouth at bedtime.   Yes Historical Provider, MD  atorvastatin (LIPITOR) 40 MG tablet Take 40 mg by mouth at bedtime.    Yes Historical Provider, MD  feeding supplement (GLUCERNA SHAKE) LIQD Take 237 mLs by mouth 3 (three) times daily between meals. 06/08/13  Yes Meredeth Ide, MD  fluticasone Aleda Grana)  50 MCG/ACT nasal spray Place 2 sprays into the nose 2 (two) times daily as needed for rhinitis or allergies.   Yes Historical Provider, MD  glyBURIDE (DIABETA) 2.5 MG tablet Take 1.25 mg by mouth daily as needed (if sugar is over 120).   Yes Historical Provider, MD  guaiFENesin (MUCINEX) 600 MG 12 hr tablet Take 1 tablet (600 mg total) by mouth 2 (two) times daily. 02/11/13  Yes Ripudeep Jenna Luo, MD  levalbuterol [facility] HFA) 45 MCG/ACT inhaler Inhale 1-2 puffs into the lungs 3 (three) times daily as needed for wheezing or shortness of breath. 02/11/13  Yes Ripudeep Jenna Luo, MD   levothyroxine (SYNTHROID, LEVOTHROID) 125 MCG tablet Take 125 mcg by mouth daily before breakfast.    Yes Historical Provider, MD  Menthol, Topical Analgesic, (BENGAY EX) Apply 1 application topically daily as needed (hand pain).   Yes Historical Provider, MD  metoprolol succinate (TOPROL-XL) 25 MG 24 hr tablet Take 12.5 mg by mouth daily.   Yes Historical Provider, MD  nitroGLYCERIN (NITROSTAT) 0.4 MG SL tablet Place 0.4 mg under the tongue as needed for chest pain. x3 doses as needed for chest pain   Yes Historical Provider, MD  oxyCODONE (ROXICODONE INTENSOL) 100 MG/5ML concentrated solution Take 16 mg by mouth every 4 (four) hours as needed (pain).   Yes Historical Provider, MD  pantoprazole (PROTONIX) 40 MG tablet Take 40 mg by mouth daily.   Yes Historical Provider, MD  polyethylene glycol (MIRALAX / GLYCOLAX) packet Take 17 g by mouth daily. 06/08/13  Yes Meredeth Ide, MD  sevelamer carbonate (RENVELA) 800 MG tablet Take 800 mg by mouth 2 (two) times daily. Breakfast and dinner   Yes Historical Provider, MD  torsemide (DEMADEX) 20 MG tablet Take 40 mg by mouth 2 (two) times daily.   Yes Historical Provider, MD  Vitamin D, Ergocalciferol, (DRISDOL) 50000 UNITS CAPS Take 50,000 Units by mouth every Monday, Wednesday, and Friday. Approximately every other day   Yes Historical Provider, MD   Allergies  Allergen Reactions  . Lisinopril     Informed by patient and family that medication causes kidney failure in patient and during last hospitalization was told by her cardiologist not to take this med.  . Morphine And Related Other (See Comments)    Made her loopy  . Phenergan [Promethazine Hcl] Other (See Comments)    Made her crazy  . Prednisone Other (See Comments)    Confusion, bad dreams, insomnia  . Trazodone And Nefazodone Other (See Comments)    hallucinations     FAMILY HISTORY:  Family History  Problem Relation Age of Onset  . Other Mother   . Other Father    SOCIAL  HISTORY:  reports that she has never smoked. She has never used smokeless tobacco. She reports that she does not drink alcohol or use illicit drugs.  REVIEW OF SYSTEMS:  Patient denies pain and endorses some shortness of breath, otherwise unable to obtain due to patient delirium.  VITAL SIGNS: Temp:  [97 F (36.1 C)-98.4 F (36.9 C)] 98.2 F (36.8 C) (11/07 2025) Pulse Rate:  [70] 70 (11/07 2025) Resp:  [10-21] 21 (11/07 2025) BP: (95-120)/(42-58) 110/43 mmHg (11/07 2025) SpO2:  [93 %-100 %] 93 % (11/07 2025) FiO2 (%):  [50 %] 50 % (11/07 2025) Weight:  [165 lb 5.5 oz (75 kg)-169 lb 15.6 oz (77.1 kg)] 165 lb 5.5 oz (75 kg) (11/07 0500) HEMODYNAMICS:   VENTILATOR SETTINGS: Vent Mode:  [-]  FiO2 (%):  [  50 %] 50 % INTAKE / OUTPUT: Intake/Output     11/07 0701 - 11/08 0700   P.O. 60   I.V. (mL/kg) 412.5 (5.5)   Total Intake(mL/kg) 472.5 (6.3)   Net +472.5       Urine Occurrence 3 x     PHYSICAL EXAMINATION: General:  Elderly female in mild respiratory distress HEENT:  Extraocular movements intact, moist mucous membranes, Ventimask in place Cardiovascular:  Irregular rhythm, 2-3/6 systolic ejection murmur at left sternal border, no edema Lungs:  Diffuse bilateral rhonchi, mildly increased work of breathing with use of abdominal muscles Abdomen:  Soft, nontender, nondistended, +bowel sounds Musculoskeletal/Extremitites:  No clubbing or cyanosis Skin:  No rash Neuro:  Arousable to voice, lethargic, able to move all extremities  LABS:  CBC Recent Labs     2013/08/08  1629  07/14/13  0530  WBC  11.4*  11.9*  HGB  12.8  12.0  HCT  42.7  39.8  PLT  242  232   Coag's No results found for this basename: APTT, INR,  in the last 72 hours BMET Recent Labs     08-08-13  1629  07/14/13  0530  07/14/13  0810  NA  136  131*  133*  K  5.8*  NOT DONE  6.5*  CL  91*  88*  89*  CO2  38*  33*  33*  BUN  85*  92*  90*  CREATININE  2.70*  2.97*  3.04*  GLUCOSE  220*  136*   143*   Electrolytes Recent Labs     08-08-13  1629  07/14/13  0530  07/14/13  0810  CALCIUM  9.0  8.5  8.3*   Sepsis Markers No results found for this basename: LACTICACIDVEN, PROCALCITON, O2SATVEN,  in the last 72 hours ABG Recent Labs     07/14/13  0745  07/14/13  1749  PHART  7.232*  7.220*  PCO2ART  93.7*  94.9*  PO2ART  117.0*  78.9*   Liver Enzymes No results found for this basename: AST, ALT, ALKPHOS, BILITOT, ALBUMIN,  in the last 72 hours Cardiac Enzymes No results found for this basename: TROPONINI, PROBNP,  in the last 72 hours Glucose Recent Labs     2013/08/08  2157  07/14/13  0755  07/14/13  1315  GLUCAP  173*  155*  150*    Imaging  Dg Chest Port 1 View  07/14/2013   CLINICAL DATA:  Left-sided pneumothorax.  EXAM: PORTABLE CHEST - 1 VIEW  COMPARISON:  Aug 08, 2013  FINDINGS: 1423 hr. The left apical pleural line visualized on the previous study is no longer evident, compatible with interval resolution of the tiny left apical pneumothorax. There is persistent bibasilar atelectasis or infiltrate. Right chest tube remains in place. The cardio pericardial silhouette is enlarged. Left delete permanent pacemaker again noted. Telemetry leads overlie the chest.  IMPRESSION: Interval resolution of tiny left apical pneumothorax.  Cardiomegaly with bibasilar collapse/consolidation and small bilateral pleural effusions.   Electronically Signed   By: Kennith Center M.D.   On: 07/14/2013 14:30    ASSESSMENT: 79 year old female with PMH of CHF with EF 30-35%, HTN, CKD and recurrent pleural effusions, now with hypoxic and hypercarbic respiratory failure, acute on chronic kidney failure and hyperkalemia. We are now consulted for evaluation of central venous catheter placement due to difficulty with peripheral IV access.  RECOMMENDATIONS: - At this time, the risk of central venous catheter placement appears to outweigh the potential benefit.  The patient does have a functioning  peripheral IV in her right wrist. In addition, her most recent ABG shows persistent hypercarbia and hypoxia and patient on exam does show some increased work of breathing and she would have difficulty being placed in Trendelenburg position for the procedure. - I spoke extensively with the family and reviewed the patient's DNR status as well as her current situation in which they would not pursue other invasive interventions such as dialysis and question whether to put the patient through the planned L pleurX catheter placement. As such, they seem much more interested in her comfort and are not interested in CVC placement at this time. - If further access is needed and family is amenable to it, would recommend considering PICC line placement instead.  - If plans change and CVC is deemed necessary, we would be happy to help.  I have personally obtained a history, examined the patient, evaluated laboratory and imaging results and formulated the assessment and plan.   Allisyn Kunz S. [facility] Pulmonary and Critical Care Medicine [facility] Pager: [phone]  07/14/2013, 8:39 PM

## 2013-07-14 NOTE — Progress Notes (Signed)
MD aware, patient's family and POA have refused placement of a central line, administration of kayexalate, and have recently refused lab draws.

## 2013-07-15 ENCOUNTER — Inpatient Hospital Stay (HOSPITAL_COMMUNITY)

## 2013-07-15 DIAGNOSIS — Z950 Presence of cardiac pacemaker: Secondary | ICD-10-CM

## 2013-07-15 DIAGNOSIS — G9340 Encephalopathy, unspecified: Secondary | ICD-10-CM

## 2013-07-15 LAB — BASIC METABOLIC PANEL
CO2: 28 mEq/L (ref 19–32)
Calcium: 8.2 mg/dL — ABNORMAL LOW (ref 8.4–10.5)
Chloride: 91 mEq/L — ABNORMAL LOW (ref 96–112)
Creatinine, Ser: 3.59 mg/dL — ABNORMAL HIGH (ref 0.50–1.10)
GFR calc Af Amer: 13 mL/min — ABNORMAL LOW (ref >90)
Potassium: 6.9 mEq/L (ref 3.5–5.1)
Sodium: 134 mEq/L — ABNORMAL LOW (ref 135–145)

## 2013-07-15 LAB — CBC
HCT: 39.4 % (ref 36.0–46.0)
MCH: 29.6 pg (ref 26.0–34.0)
MCHC: 30.2 g/dL (ref 30.0–36.0)
MCV: 98 fL (ref 78.0–100.0)
Platelets: 233 10*3/uL (ref 150–400)
RDW: 15.3 % (ref 11.5–15.5)
WBC: 10.5 10*3/uL (ref 4.0–10.5)

## 2013-07-15 LAB — GLUCOSE, CAPILLARY
Glucose-Capillary: 141 mg/dL — ABNORMAL HIGH (ref 70–99)
Glucose-Capillary: 138 mg/dL — ABNORMAL HIGH (ref 70–99)

## 2013-07-15 MED ORDER — LORAZEPAM 2 MG/ML IJ SOLN
1.0000 mg/h | INTRAVENOUS | Status: DC
  Administered 2013-07-15: 1 mg/h via INTRAVENOUS
  Administered 2013-07-15: 6 mg/h via INTRAVENOUS
  Filled 2013-07-15 (×2): qty 25

## 2013-07-15 MED ORDER — SODIUM CHLORIDE 0.9 % IV SOLN
75.0000 ug/h | INTRAVENOUS | Status: DC
  Filled 2013-07-15: qty 50

## 2013-07-15 MED ORDER — DEXTROSE 50 % IV SOLN
1.0000 | Freq: Once | INTRAVENOUS | Status: AC
  Administered 2013-07-15: 50 mL via INTRAVENOUS
  Filled 2013-07-15: qty 50

## 2013-07-15 MED ORDER — LORAZEPAM 2 MG/ML IJ SOLN
2.0000 mg | Freq: Once | INTRAMUSCULAR | Status: AC
  Administered 2013-07-15: 2 mg via INTRAVENOUS
  Filled 2013-07-15: qty 1

## 2013-07-15 MED ORDER — LORAZEPAM BOLUS VIA INFUSION
2.0000 mg | Freq: Once | INTRAVENOUS | Status: AC
  Administered 2013-07-15: 2 mg via INTRAVENOUS
  Filled 2013-07-15: qty 2

## 2013-07-15 MED ORDER — LORAZEPAM 2 MG/ML IJ SOLN
INTRAMUSCULAR | Status: AC
  Filled 2013-07-15: qty 1

## 2013-07-15 MED ORDER — FENTANYL CITRATE 0.05 MG/ML IJ SOLN
50.0000 ug | Freq: Once | INTRAMUSCULAR | Status: AC
  Administered 2013-07-15: 50 ug via INTRAVENOUS

## 2013-07-15 MED ORDER — SODIUM CHLORIDE 0.9 % IV SOLN
10.0000 ug/h | INTRAVENOUS | Status: DC
  Administered 2013-07-15: 10 ug/h via INTRAVENOUS
  Filled 2013-07-15: qty 50

## 2013-07-15 MED ORDER — FENTANYL CITRATE 0.05 MG/ML IJ SOLN
INTRAMUSCULAR | Status: AC
  Administered 2013-07-15: 50 ug via INTRAVENOUS
  Filled 2013-07-15: qty 2

## 2013-07-15 MED ORDER — FENTANYL CITRATE 0.05 MG/ML IJ SOLN
25.0000 ug | INTRAMUSCULAR | Status: DC | PRN
  Administered 2013-07-15 (×2): 50 ug via INTRAVENOUS
  Filled 2013-07-15 (×2): qty 2

## 2013-07-15 MED ORDER — INSULIN ASPART 100 UNIT/ML ~~LOC~~ SOLN
8.0000 [IU] | Freq: Once | SUBCUTANEOUS | Status: AC
  Administered 2013-07-15: 8 [IU] via INTRAVENOUS

## 2013-07-15 MED ORDER — LORAZEPAM 2 MG/ML IJ SOLN
1.0000 mg | Freq: Once | INTRAMUSCULAR | Status: AC
  Administered 2013-07-15: 1 mg via INTRAVENOUS

## 2013-07-17 ENCOUNTER — Ambulatory Visit: Payer: Medicare Other | Admitting: Cardiology

## 2013-07-17 LAB — BODY FLUID CULTURE: Culture: NO GROWTH

## 2013-07-18 ENCOUNTER — Telehealth: Payer: Self-pay | Admitting: Internal Medicine

## 2013-07-18 NOTE — Telephone Encounter (Signed)
Mellody Dance W/ Ivor Messier & Louanne Skye Aware D/C Will be signed By Dr.Bensimhon  07/17/2013/KM

## 2013-07-18 NOTE — Telephone Encounter (Signed)
Faxed Copy Of D/C rec, Faxing to [facility] S/Bensimhon For Signature  01-Aug-2013/KM

## 2013-07-19 ENCOUNTER — Ambulatory Visit: Payer: Medicare Other | Admitting: Cardiology

## 2013-07-20 ENCOUNTER — Institutional Professional Consult (permissible substitution): Payer: Medicare Other | Admitting: Internal Medicine

## 2013-07-25 ENCOUNTER — Encounter (HOSPITAL_COMMUNITY): Payer: Medicare Other

## 2013-08-07 NOTE — Progress Notes (Signed)
CRITICAL VALUE ALERT  Critical value received:  k 6.9  Date of notification:  07/19/2013   Time of notification:  0727  Critical value read back:yes  Nurse who received alert:  Barnie Del RN  MD notified (1st page):  Rizwan Md  Time of first page:  0727  MD notified (2nd page):  Time of second page:  Responding MD:  Butler Denmark MD   Time MD responded:  [phone]

## 2013-08-07 NOTE — Progress Notes (Signed)
Related admission to Hospice diagnosis of systolic heart failure.  Patient is a partial code.  Found patient with many family members at the bedside following discussions with physician.  Family has elected comfort care only and patient will receive Fentanyl gtt for pain management.  Continue comfort care and anticipate hospital death.  Encourage a call to Hospice at [phone] with any needs.  April Costella Hatcher, RN, BSN Hospice

## 2013-08-07 NOTE — Progress Notes (Signed)
Pt on comfort care measures only.  Upon arrival to room at shift change, pt comfortable. No s/s of distress or pain.  Ativan/fentanyl IV gtts infusing.  Family at bedside.  Will reposition per request of the family.  No concerns or needs at this time. Will continue to monitor.  M.Foster Simpson, RN

## 2013-08-07 NOTE — Progress Notes (Signed)
[facility] Progress Note [place] TEAM 1 - Stepdown ICU Team   Miranda Cruz:[id number] DOB: 03-Jun-1930 DOA: 08/01/2013 PCP: Hoyle Sauer, MD  Brief narrative: 79 y.o. female with a past medical history of ischemic cardiomyopathy (ejection fraction of 30-35%),  recurrent pleural effusions, status post right-sided pleural catheter placement. She was recently discharged from our service on 07/10/2013 after presenting with complaints of shortness of breath. She was found to have a large left-sided pleural effusion and subsequently underwent thoracentesis with removal of 2 L of fluid. She was discharged home in stable condition with home hospice services and oxygen.   She returned to the [facility] apartment with worsening shortness of breath, progressive lethargy, minimal by mouth intake, with an overall steep functional decline since her recent hospitalization. Family members did not report fevers, chills, nausea vomiting, chest pain or focal deficits. She was evaluated by hospice nurse prior to arrival and was found to have significant diminished breath sounds on left lung side. She was referred to the emergency department for further evaluation. She was found to be hypoxemic upon presentation with an O2 sat of 60% and was placed on a 100% nonrebreather. A chest x-ray showed re-accumulation of prior large left pleural effusion. Admitting MD discussed the case with pulmonary critical care medicine. Plans were to proceed with a therapeutic thoracentesis. Also consideration for placement of left-sided Pleurx catheter this admission. Patient unable to participate in her primary care or provide history. History was obtained from emergency room staff and family members who were present at bedside    Assessment/Plan: Active Problems:   ARF (acute renal failure)/CKD (chronic kidney disease) stage 4, GFR 15-29 ml/min/hyperkalemia  Hyperkalemia    Acute respiratory failure with hypoxia and  hypercapnea/Recurrent Pleural effusion, left O2 requirement increased to 100% once again last night    DM (diabetes mellitus)    Cardiomyopathy, ischemic/Chronic systolic CHF (congestive heart failure)    Encephalopathy acute -due to hypercapnea and to some extent azotemia and hypoxia    Severe protein-calorie malnutrition -EOL/FTT  Pt now is comfort care only. Fentanyl started but pt apparently having jerking from it. Family requesting Benzo's instead. Start Ativan- give bolus an start infusion.    DVT prophylaxis: Subcutaneous heparin Code Status: DNR Family Communication: 2 daughters at bedside Disposition Plan/Expected LOS: Step down Isolation: None Nutritional Status: Acute on chronic protein calorie malnutrition related to underlying severe cardiorespiratory dizzy  Consultants: Pulmonary Cardiology Outpatient hospice  Procedures: Left thoracentesis  11/6  Antibiotics: None  HPI/Subjective: Patient unresponsive  Objective: Blood pressure 112/44, pulse 70, temperature 98.4 F (36.9 C), temperature source Axillary, resp. rate 15, height 5\' 2"  (1.575 m), weight 75 kg (165 lb 5.5 oz), SpO2 92.00%.  Intake/Output Summary (Last 24 hours) at 07/14/2013 1227 Last data filed at 07/09/2013 0600  Gross per 24 hour  Intake  912.5 ml  Output      0 ml  Net  912.5 ml     Exam: General: obtunded Lungs: Coarse to auscultation bilaterally without wheezes or crackles, 100 % NRB Cardiovascular: Regular rate and ventricular rhythm without murmur gallop or rub normal S1 and S2, no peripheral edema or JVD Abdomen: Nontender, nondistended, soft, bowel sounds positive, no rebound, no ascites, no appreciable mass Musculoskeletal: No significant cyanosis, clubbing of bilateral lower extremities   Scheduled Meds:  Scheduled Meds: . dextrose  0.5 ampule Intravenous Once  . LORazepam      . scopolamine  1 patch Transdermal Q72H   Continuous Infusions: . LORazepam (  ATIVAN)  infusion      **Reviewed in detail by the Attending Physician  Data Reviewed: Basic Metabolic Panel:  Recent Labs Lab 07/10/13 0730 07/28/2013 1629 07/14/13 0530 07/14/13 0810 07/13/2013 0535  NA 134* 136 131* 133* 134*  K 4.5 5.8* NOT DONE 6.5* 6.9*  CL 92* 91* 88* 89* 91*  CO2 31 38* 33* 33* 28  GLUCOSE 152* 220* 136* 143* 132*  BUN 66* 85* 92* 90* 99*  CREATININE 1.67* 2.70* 2.97* 3.04* 3.59*  CALCIUM 8.5 9.0 8.5 8.3* 8.2*   Liver Function Tests:  Recent Labs Lab 07/08/13 1558 07/09/13 0616 07/20/2013 2230  AST 29 36  --   ALT 8 9  --   ALKPHOS 118* 101  --   BILITOT 0.5 0.6  --   PROT 6.8 6.0 6.2  ALBUMIN 2.8* 2.3*  --    No results found for this basename: LIPASE, AMYLASE,  in the last 168 hours No results found for this basename: AMMONIA,  in the last 168 hours CBC:  Recent Labs Lab 07/08/13 1558 07/09/13 0616 08/04/2013 1629 07/14/13 0530 07/24/2013 0535  WBC 9.3 6.5 11.4* 11.9* 10.5  NEUTROABS  --   --  10.0*  --   --   HGB 12.2 12.3 12.8 12.0 11.9*  HCT 40.5 40.5 42.7 39.8 39.4  MCV 99.0 97.4 98.2 98.5 98.0  PLT 186 153 242 232 233   Cardiac Enzymes:  Recent Labs Lab 07/08/13 1558  TROPONINI <0.30   BNP (last 3 results)  Recent Labs  05/29/13 2056 05/30/13 1424 07/08/13 1558  PROBNP 9064.0* 8898.0* 7455.0*   CBG:  Recent Labs Lab 07/10/2013 2157 07/14/13 0755 07/14/13 1315 07/14/13 2212 07/31/2013 0753  GLUCAP 173* 155* 150* 141* 138*    Recent Results (from the past 240 hour(s))  BODY FLUID CULTURE     Status: None   Collection Time    07/08/13  8:15 PM      Result Value Range Status   Specimen Description THORACENTESIS LEFT FLUID   Final   Special Requests NONE   Final   Gram Stain     Final   Value: RARE WBC NO ORGANISMS SEEN     CYTOSPIN     Performed at [facility]   Culture     Final   Value: NO GROWTH 3 DAYS     Performed at [facility]   Report Status 07/12/2013 FINAL   Final  MRSA PCR SCREENING      Status: None   Collection Time    08/03/2013  9:21 PM      Result Value Range Status   MRSA by PCR NEGATIVE  NEGATIVE Final   Comment:            The GeneXpert MRSA Assay (FDA     approved for NASAL specimens     only), is one component of a     comprehensive MRSA colonization     surveillance program. It is not     intended to diagnose MRSA     infection nor to guide or     monitor treatment for     MRSA infections.  AFB CULTURE WITH SMEAR     Status: None   Collection Time    07/19/2013 11:52 PM      Result Value Range Status   Specimen Description PLEURAL FLUID   Final   Special Requests NONE   Final   ACID FAST SMEAR  Final   Value: NO ACID FAST BACILLI SEEN     Performed at [facility]   Culture     Final   Value: CULTURE WILL BE EXAMINED FOR 6 WEEKS BEFORE ISSUING A FINAL REPORT     Performed at [facility]   Report Status PENDING   Incomplete  BODY FLUID CULTURE     Status: None   Collection Time    08/03/2013 11:52 PM      Result Value Range Status   Specimen Description PLEURAL FLUID   Final   Special Requests NONE   Final   Gram Stain     Final   Value: FEW WBC PRESENT,BOTH PMN AND MONONUCLEAR     NO ORGANISMS SEEN     Performed at [facility]   Culture PENDING   Incomplete   Report Status PENDING   Incomplete  FUNGUS CULTURE W SMEAR     Status: None   Collection Time    07/21/2013 11:52 PM      Result Value Range Status   Specimen Description PLEURAL FLUID   Final   Special Requests NONE   Final   Fungal Smear     Final   Value: NO YEAST OR FUNGAL ELEMENTS SEEN     Performed at [facility]   Culture     Final   Value: CULTURE IN PROGRESS FOR FOUR WEEKS     Performed at [facility]   Report Status PENDING   Incomplete       Loyd Marhefka,MD [phone] 07/29/2013, 12:27 PM

## 2013-08-07 NOTE — Progress Notes (Signed)
Advanced Heart Failure Team Consult Note   Primary Cardiologist:  Skains/Bensimhon  Reason for Consultation: Pleural effusion/HF  HPI:    More obtunded. Dyspneic. pCO 95  K 6.9  Renal function getting worse    Objective:    Vital Signs:   Temp:  [98.2 F (36.8 C)-98.4 F (36.9 C)] 98.4 F (36.9 C) (11/08 0415) Pulse Rate:  [70] 70 (11/08 0800) Resp:  [12-21] 15 (11/08 0800) BP: (88-120)/(31-48) 112/44 mmHg (11/08 0800) SpO2:  [76 %-100 %] 92 % (11/08 0803) FiO2 (%):  [50 %] 50 % (11/07 2025) Weight:  [75 kg (165 lb 5.5 oz)] 75 kg (165 lb 5.5 oz) (11/08 0500) Last BM Date: 07/12/13  Weight change: Filed Weights   09-Aug-2013 2104 07/14/13 0500 08/05/2013 0500  Weight: 77.1 kg (169 lb 15.6 oz) 75 kg (165 lb 5.5 oz) 75 kg (165 lb 5.5 oz)    Intake/Output:   Intake/Output Summary (Last 24 hours) at 08/06/2013 0835 Last data filed at 07/12/2013 0600  Gross per 24 hour  Intake  972.5 ml  Output      0 ml  Net  972.5 ml     Physical Exam: General: chronically ill-appearing; lethargic but arouses mildly. dyspneic Neck: hard to see JVD. Does not appear overly elevated no thyromegaly or thyroid nodule.  Lungs: Diffuse rhonchi anteriorly CV: Nondisplaced PMI. Heart irregular S1/S2, no S3/S4, 3/6 SEM LSB. tr bilateral ankle edema.Abdomen: Soft, nontender, no hepatosplenomegaly, no distention.  Skin: Intact without lesions or rashes.  Neurologic: moves all 4 spontaneously  Psych: lethargic Extremities: No clubbing or cyanosis.   Telemetry: AF  Labs: Basic Metabolic Panel:  Recent Labs Lab 07/10/13 0730 08/09/13 1629 07/14/13 0530 07/14/13 0810 07/24/2013 0535  NA 134* 136 131* 133* 134*  K 4.5 5.8* NOT DONE 6.5* 6.9*  CL 92* 91* 88* 89* 91*  CO2 31 38* 33* 33* 28  GLUCOSE 152* 220* 136* 143* 132*  BUN 66* 85* 92* 90* 99*  CREATININE 1.67* 2.70* 2.97* 3.04* 3.59*  CALCIUM 8.5 9.0 8.5 8.3* 8.2*    Liver Function Tests:  Recent Labs Lab 07/08/13 1558  07/09/13 0616 2013-08-09 2230  AST 29 36  --   ALT 8 9  --   ALKPHOS 118* 101  --   BILITOT 0.5 0.6  --   PROT 6.8 6.0 6.2  ALBUMIN 2.8* 2.3*  --    No results found for this basename: LIPASE, AMYLASE,  in the last 168 hours No results found for this basename: AMMONIA,  in the last 168 hours  CBC:  Recent Labs Lab 07/08/13 1558 07/09/13 0616 08-09-13 1629 07/14/13 0530 08/03/2013 0535  WBC 9.3 6.5 11.4* 11.9* 10.5  NEUTROABS  --   --  10.0*  --   --   HGB 12.2 12.3 12.8 12.0 11.9*  HCT 40.5 40.5 42.7 39.8 39.4  MCV 99.0 97.4 98.2 98.5 98.0  PLT 186 153 242 232 233    Cardiac Enzymes:  Recent Labs Lab 07/08/13 1558  TROPONINI <0.30    BNP: BNP (last 3 results)  Recent Labs  05/29/13 2056 05/30/13 1424 07/08/13 1558  PROBNP 9064.0* 8898.0* 7455.0*    CBG:  Recent Labs Lab August 09, 2013 2157 07/14/13 0755 07/14/13 1315 07/14/13 2212 07/17/2013 0753  GLUCAP 173* 155* 150* 141* 138*    Coagulation Studies: No results found for this basename: LABPROT, INR,  in the last 72 hours  Other results:   Imaging: Dg Chest 2 View  08-09-2013   CLINICAL  DATA:  Shortness of breath, cough, congestion  EXAM: CHEST  2 VIEW  COMPARISON:  07/10/2013  FINDINGS: There is now complete opacification of the left hemi thorax with interval increase in the previously seen pleural effusion, with complete obscuration of the underlying left lung. Moderate right pleural effusion is reidentified with obscuration of the underlying lung as well. This is stable. Heart size is not accurately assessed. Dual lead left-sided pacer in place. No acute osseous abnormality.  IMPRESSION: Enlargement of now large left pleural effusion with complete opacification of the left hemi thorax.   Electronically Signed   By: Christiana Pellant M.D.   On: 07/12/2013 17:14   Dg Chest Port 1 View  07/14/2013   CLINICAL DATA:  Left-sided pneumothorax.  EXAM: PORTABLE CHEST - 1 VIEW  COMPARISON:  07/12/2013  FINDINGS:  1423 hr. The left apical pleural line visualized on the previous study is no longer evident, compatible with interval resolution of the tiny left apical pneumothorax. There is persistent bibasilar atelectasis or infiltrate. Right chest tube remains in place. The cardio pericardial silhouette is enlarged. Left delete permanent pacemaker again noted. Telemetry leads overlie the chest.  IMPRESSION: Interval resolution of tiny left apical pneumothorax.  Cardiomegaly with bibasilar collapse/consolidation and small bilateral pleural effusions.   Electronically Signed   By: Kennith Center M.D.   On: 07/14/2013 14:30   Dg Chest Port 1 View  07/08/2013   CLINICAL DATA:  82-year- female status post thoracentesis. Initial encounter.  EXAM: PORTABLE CHEST - 1 VIEW  COMPARISON:  1659 hr the same day and earlier.  FINDINGS: Portable AP semi upright view at at 2205 hrs. Markedly reduced left pleural effusion. Cardiac and mediastinal contours now visible. Left lung ventilation appears near normal. Cardiomegaly. Left chest cardiac pacemaker. Difficult to exclude a small left apical pneumothorax on this image (arrow).  Right chest tube remains in place. Right lung base pleural effusion and/or airspace opacity is stable.  IMPRESSION: 1. Markedly decreased left pleural effusion status post thoracentesis. Difficult to exclude a tiny left apical pneumothorax.  2. Stable right lung.   Electronically Signed   By: Augusto Gamble M.D.   On: 07/19/2013 22:32     Medications:     Current Medications: . allopurinol  100 mg Oral q morning - 10a  . ALPRAZolam  1 mg Oral QHS  . antiseptic oral rinse  15 mL Mouth Rinse BID  . aspirin EC  81 mg Oral QHS  . atorvastatin  40 mg Oral QHS  . dextrose  0.5 ampule Intravenous Once  . feeding supplement (GLUCERNA SHAKE)  237 mL Oral TID BM  . heparin  5,000 Units Subcutaneous Q8H  . insulin aspart  0-15 Units Subcutaneous TID WC  . insulin aspart  0-5 Units Subcutaneous QHS  . insulin aspart   10 Units Intravenous Once  . levothyroxine  125 mcg Oral QAC breakfast  . metoprolol succinate  12.5 mg Oral Daily  . pantoprazole  40 mg Oral Daily  . polyethylene glycol  17 g Oral Daily  . scopolamine  1 patch Transdermal Q72H  . sevelamer carbonate  800 mg Oral BID WC  . sodium chloride  3 mL Intravenous Q12H  . torsemide  40 mg Oral BID    Infusions: . sodium chloride 50 mL/hr at 07/14/13 1145     Assessment:   1. Acute on chronic hypercarbic respiratory failure 2. Recurrent bilateral pleural effusions 3. Acute on chronic renal failure, stage IV 4. Chronic atrial fibrillation  5. CAD 6. Hyperkalemia 7. DNR/DNI 8. Morphine allergy    Plan/Discussion:    She has become progressively ill over past few months and now I feel she is truly end-stage and may be uncomfortable at times.   Long talk with family and we have decided to make her comfort care. Will d/c all non-essential things and start Fentanyl drip. Family considering turning pacer off as well.    Truman Hayward 8:35 AM Advanced Heart Failure Team Pager [phone] (M-F; 7a - 4p)  Please contact [facility] for night-coverage after hours (4p -7a ) and weekends on [email or web address]

## 2013-08-07 NOTE — Progress Notes (Signed)
Monitor reading asystole. RR 0. No oxygen saturation.  Aundra Millet, RN and Ricki, RN pronounced at Jan 30, 2226.  Comforted family at bedside.  [facility] notified.  M.Foster Simpson, RN

## 2013-08-07 DEATH — deceased

## 2013-08-11 LAB — FUNGUS CULTURE W SMEAR: Fungal Smear: NONE SEEN

## 2013-08-25 LAB — AFB CULTURE WITH SMEAR (NOT AT ARMC): Acid Fast Smear: NONE SEEN

## 2013-09-07 NOTE — Discharge Summary (Addendum)
Death Summary  Miranda Cruz UJW:[id number] DOB: [birth year] DOA: 07-Aug-2013  PCP: Miranda Sauer, MD  Admit date: 08-07-2013 Date of Death: 11/8//2014  Final Diagnoses:  Active Problems:   ARF (acute renal failure)   CKD (chronic kidney disease) stage 4, GFR 15-29 ml/min   DM (diabetes mellitus)   Cardiomyopathy, ischemic   Encephalopathy acute   Acute respiratory failure with hypoxia   Pleural effusion, left   Severe protein-calorie malnutrition   Chronic systolic CHF (congestive heart failure)   History of present illness:  80 y.o. female with a past medical history of ischemic cardiomyopathy (ejection fraction of 30-35%), recurrent pleural effusions, status post right-sided pleural catheter placement. She was recently discharged from our service on 07/10/2013 after presenting with complaints of shortness of breath. She was found to have a large left-sided pleural effusion and subsequently underwent thoracentesis with removal of 2 L of fluid. She was discharged home in stable condition with home hospice services and oxygen.   She returned to the [facility] apartment with worsening shortness of breath, progressive lethargy, minimal by mouth intake, with an overall steep functional decline since her recent hospitalization. Family members did not report fevers, chills, nausea vomiting, chest pain or focal deficits. She was evaluated by hospice nurse prior to arrival and was found to have significant diminished breath sounds on left lung side. She was referred to the emergency department for further evaluation. She was found to be hypoxemic upon presentation with an O2 sat of 60% and was placed on a 100% nonrebreather. A chest x-ray showed re-accumulation of prior large left pleural effusion. Admitting MD discussed the case with pulmonary critical care medicine. Plans were to proceed with a therapeutic thoracentesis. Also consideration for placement of left-sided Pleurx catheter this admission.  Patient unable to participate in her primary care or provide history.   Hospital Course:  Acute respiratory failure with hypoxia and hypercapnea/Recurrent  Pleural effusion, left / Systolic CHF - Miranda Cruz had a  considerable decline over the past few month due to end stage CHF - she underwent thoracentesis on 08/08/2023 however, began to decline again rather quickly - a pleurex vs comfort care with a morphine infusion was discussed with the patient's family and on 11/8 a palliative care consult was requested for comfort care only.  - The patient expired on 11/8 at 2227- her family was present  ARF (acute renal failure)/CKD (chronic kidney disease) stage 4, GFR 15-29 ml/min/hyperkalemia   Hyperkalemia   DM (diabetes mellitus)   Cardiomyopathy, ischemic/Chronic systolic CHF (congestive heart failure)   Encephalopathy acute  -due to hypercapnea and to some extent azotemia and hypoxia   Severe protein-calorie malnutrition    Time: 35 min  Signed:  Michelangelo Cruz  [facility] 07/31/2013, 9:40 AM

## 2014-01-19 IMAGING — CR DG CHEST 2V
2 series · 2 of 2 positions shown · non-contrast
Comparison: 05/10/2013

CLINICAL DATA: Shortness of breath, cough, congestion. Pleural
fluid, drain in place.

EXAM:
CHEST  2 VIEW

[w chest lat]
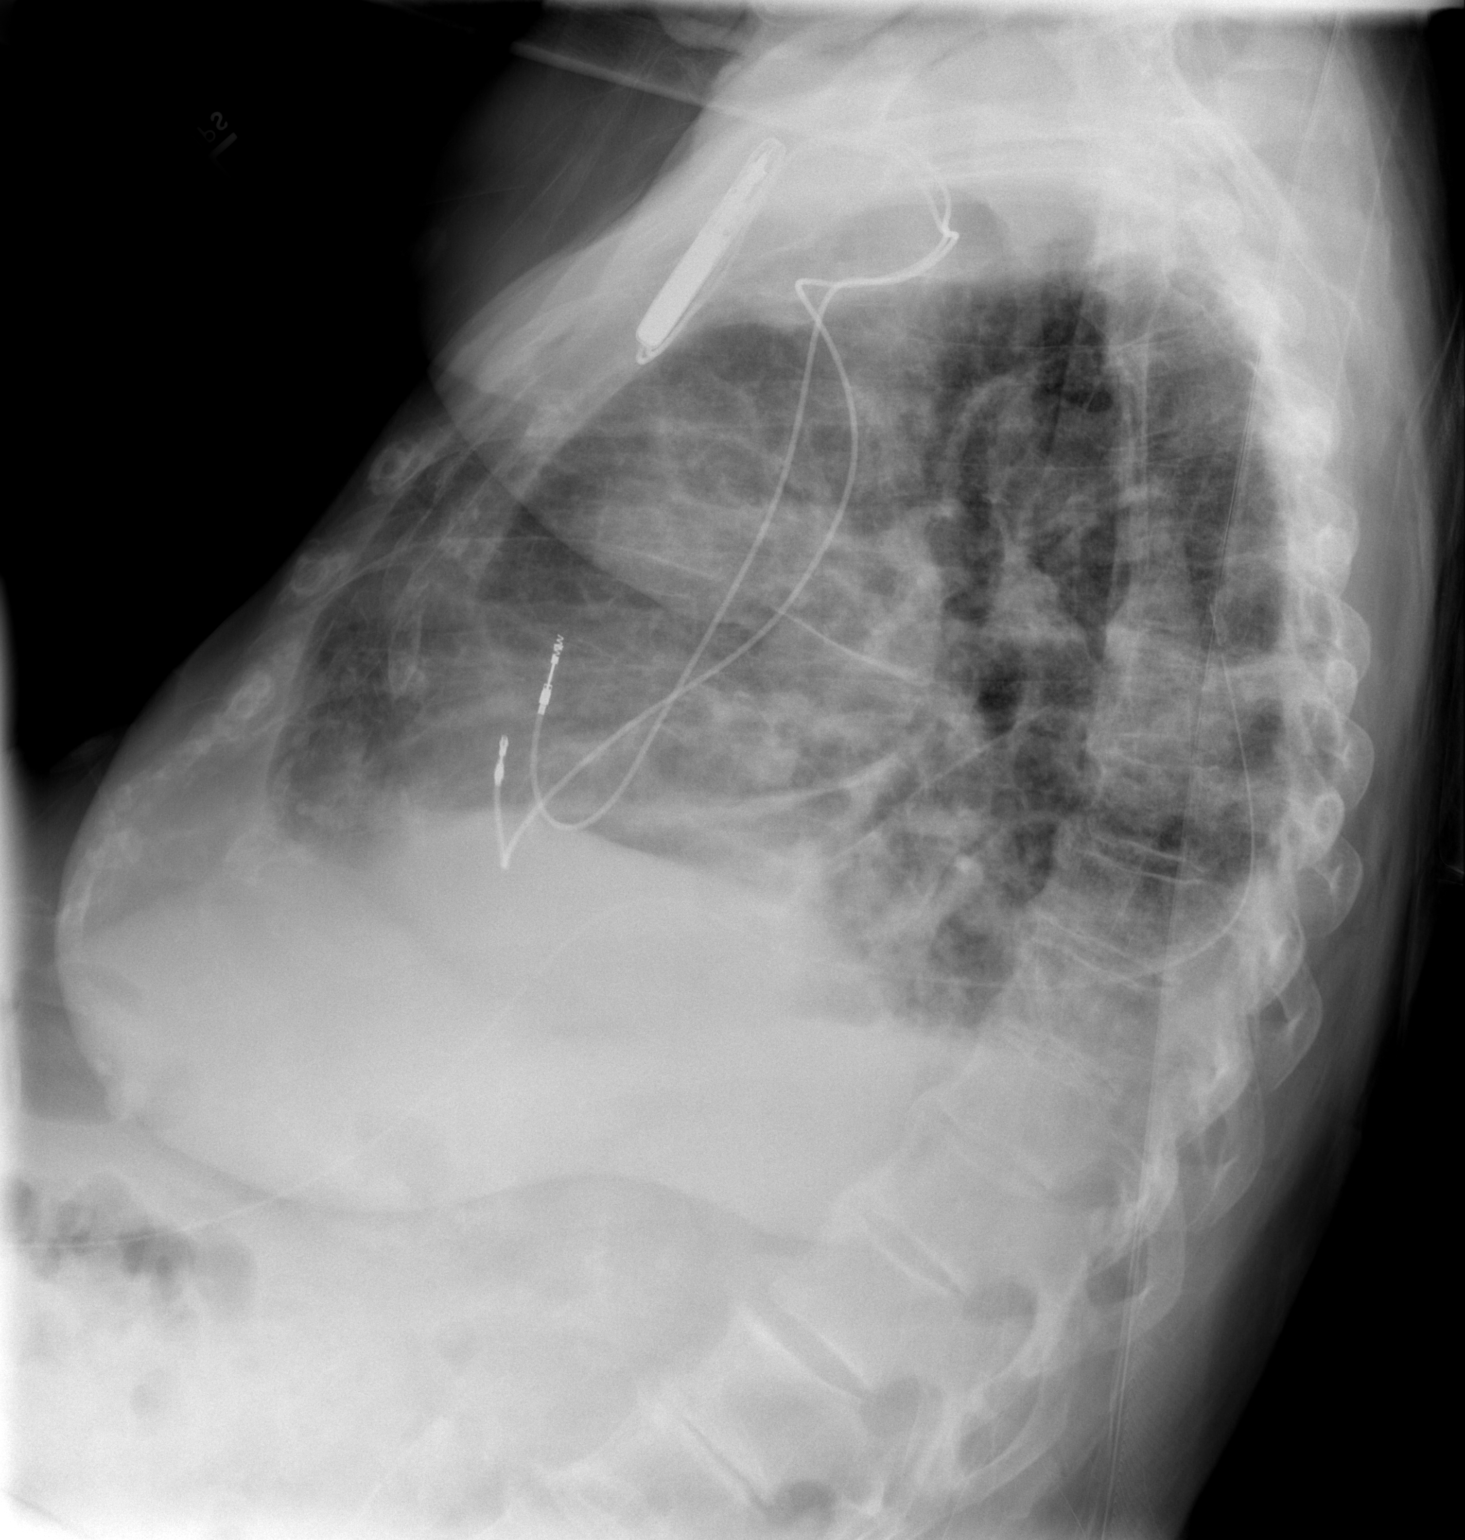

[w chest pa]
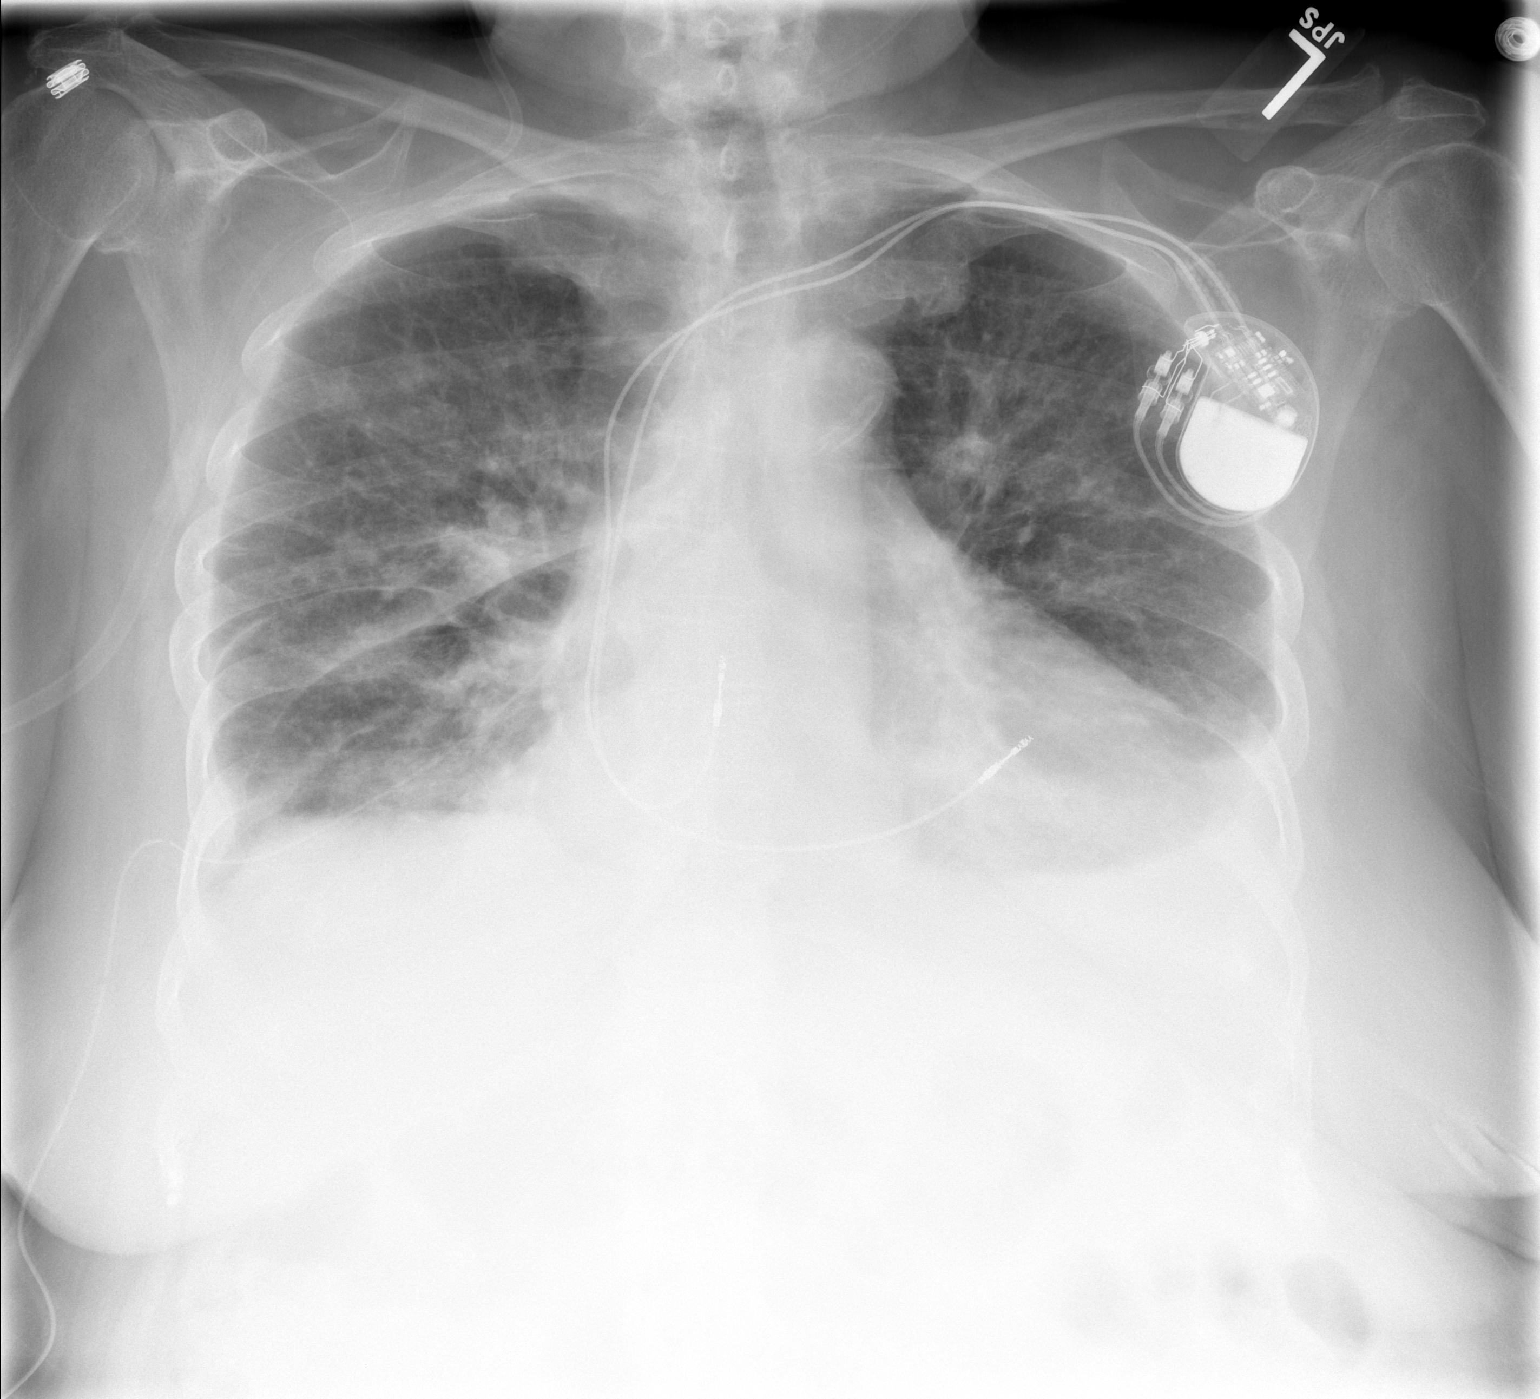

[2 of 2 positions shown; findings below may reference images not displayed]

FINDINGS: Left pacer is in place. Cardiomegaly with vascular congestion.
Suspect mild interstitial edema. Bibasilar atelectasis. Small left
pleural effusion, increased since prior study. Right pleural drain
in place without significant right effusion or pneumothorax.
IMPRESSION: Small left pleural effusion. Right pleural drain in place without
visible significant effusion or pneumothorax.

Cardiomegaly, bibasilar atelectasis. Suspect mild pulmonary edema.

## 2014-01-29 IMAGING — CR DG CHEST 1V PORT
1 series · 1 of 1 positions shown · non-contrast
Comparison: 05/30/2013.

CLINICAL DATA: Cough and congestion

EXAM:
PORTABLE CHEST - 1 VIEW

[AP]
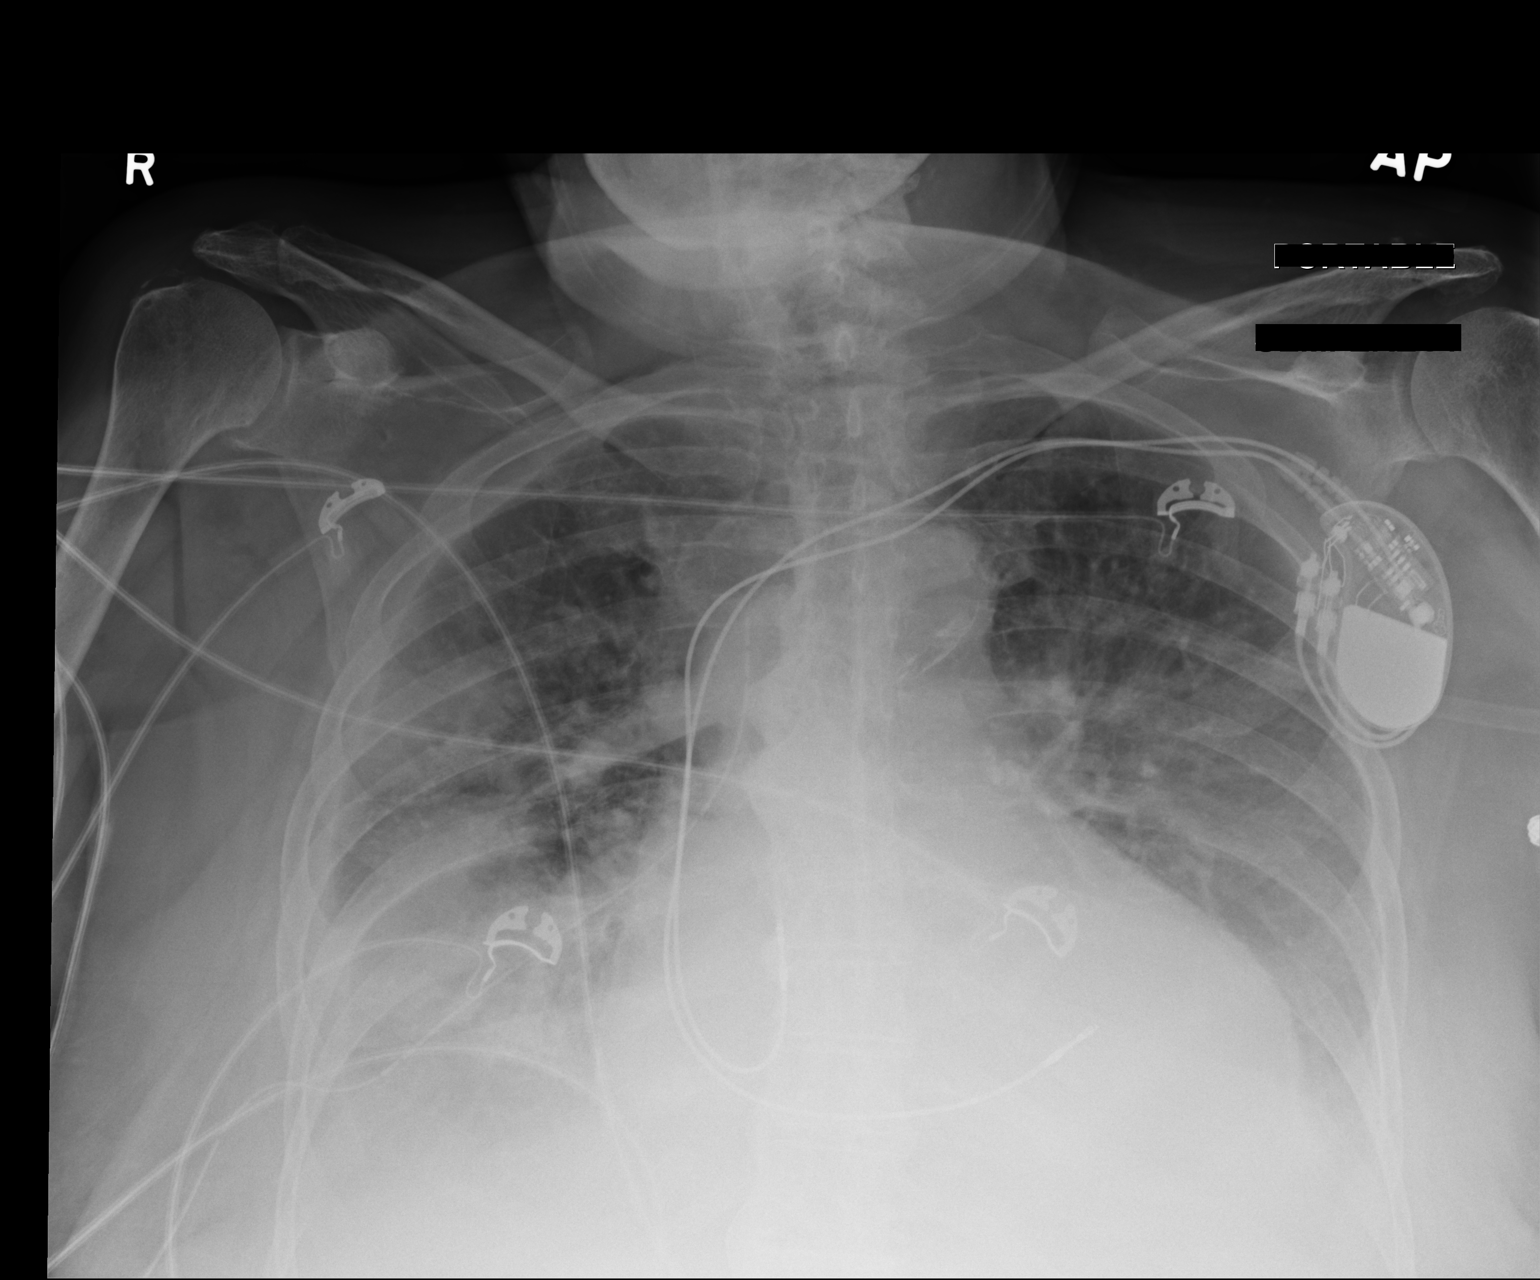

[1 of 1 positions shown; findings below may reference images not displayed]

FINDINGS: Unchanged positioning of dual-chamber left approach pacer.
Right-sided pleural drain in stable position.

No change in cardiomegaly. Haziness of the bilateral chest
consistent with effusions, unchanged. Air bronchograms noted at the
right base. No pneumothorax or edema.
IMPRESSION: 1. Stable volume bilateral pleural effusions. Right-sided pleural
drain in stable position.
2. Bibasilar atelectasis or consolidation.

## 2014-02-01 IMAGING — CR DG CHEST 1V PORT
1 series · 1 of 1 positions shown · non-contrast
Comparison: 06/01/2013

CLINICAL DATA: Shortness of breath.

EXAM:
PORTABLE CHEST - 1 VIEW

[AP]
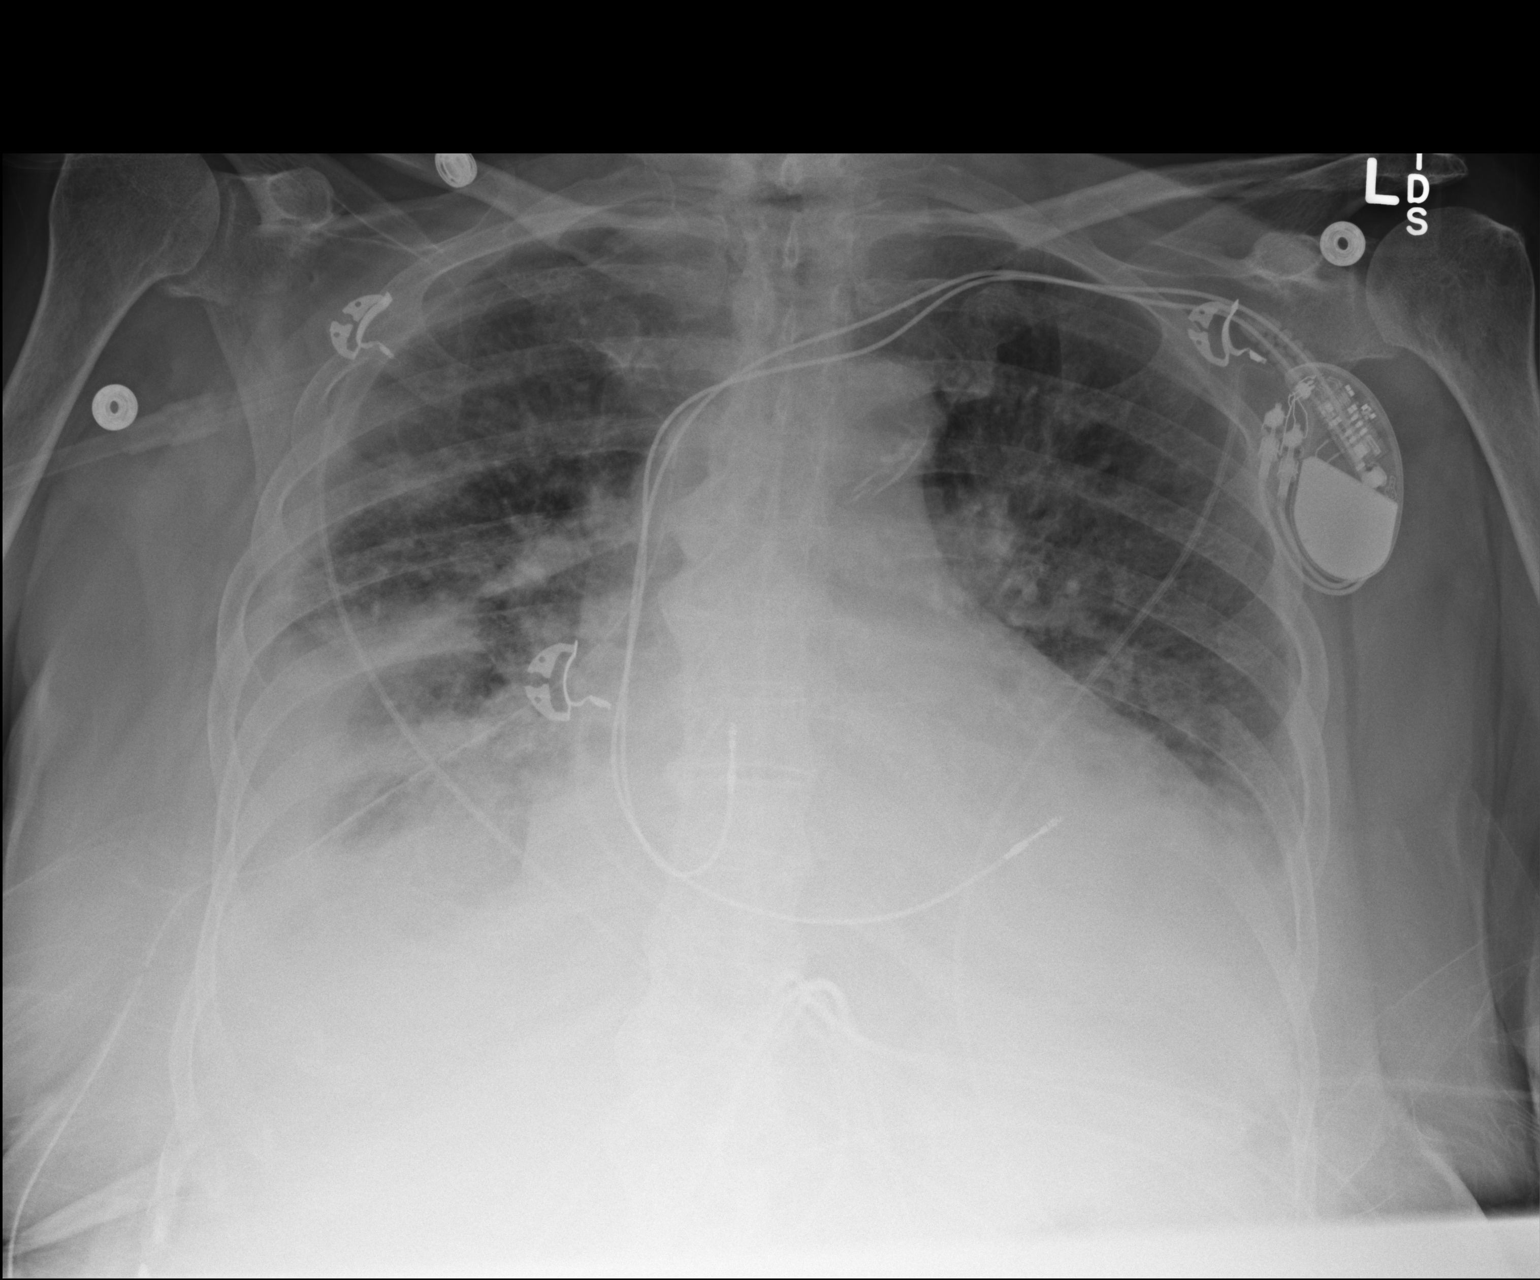

[1 of 1 positions shown; findings below may reference images not displayed]

FINDINGS: Lung base opacity is consistent with bilateral effusions with
associated atelectasis and/or infiltrate. This is similar to the
prior study allowing for differences in patient positioning.
Right-sided chest tube is stable with its tip along the upper medial
right hemi thorax.

No pneumothorax.

The cardiac silhouette is mildly enlarged. The mediastinum is normal
in contour.
IMPRESSION: No significant change from the most recent prior exam.

Bilateral effusions with associated lung base opacity, which may
reflect atelectasis or infiltrate. Right chest tube is stable. No
pneumothorax.

## 2014-02-04 IMAGING — XA IR EVAL AND MANAGEMENT
1 series · 1 of 1 positions shown · non-contrast
Comparison: none

IR RADIOLOGIST EVALUATION AND MANAGEMENT OF NINE DRAINING RIGHT
TUNNELED PLEURAL DRAIN

Date: 06/07/2013
CLINICAL HISTORY: 82-year-old female with history of CHF and
chronic symptomatic recurrent right pleural effusion.  She had a
tunneled pleural drain placed on May 11, 2013 with removal of
1.3 liters of pleural fluid.  She is currently admitted with
shortness of breath.  Her pleural drain is not draining well and
she has a confirmed right pleural effusion by ultrasound
evaluation.

[Series 1: fl neuro · 1 of 1 slices shown]
[im 1/1]
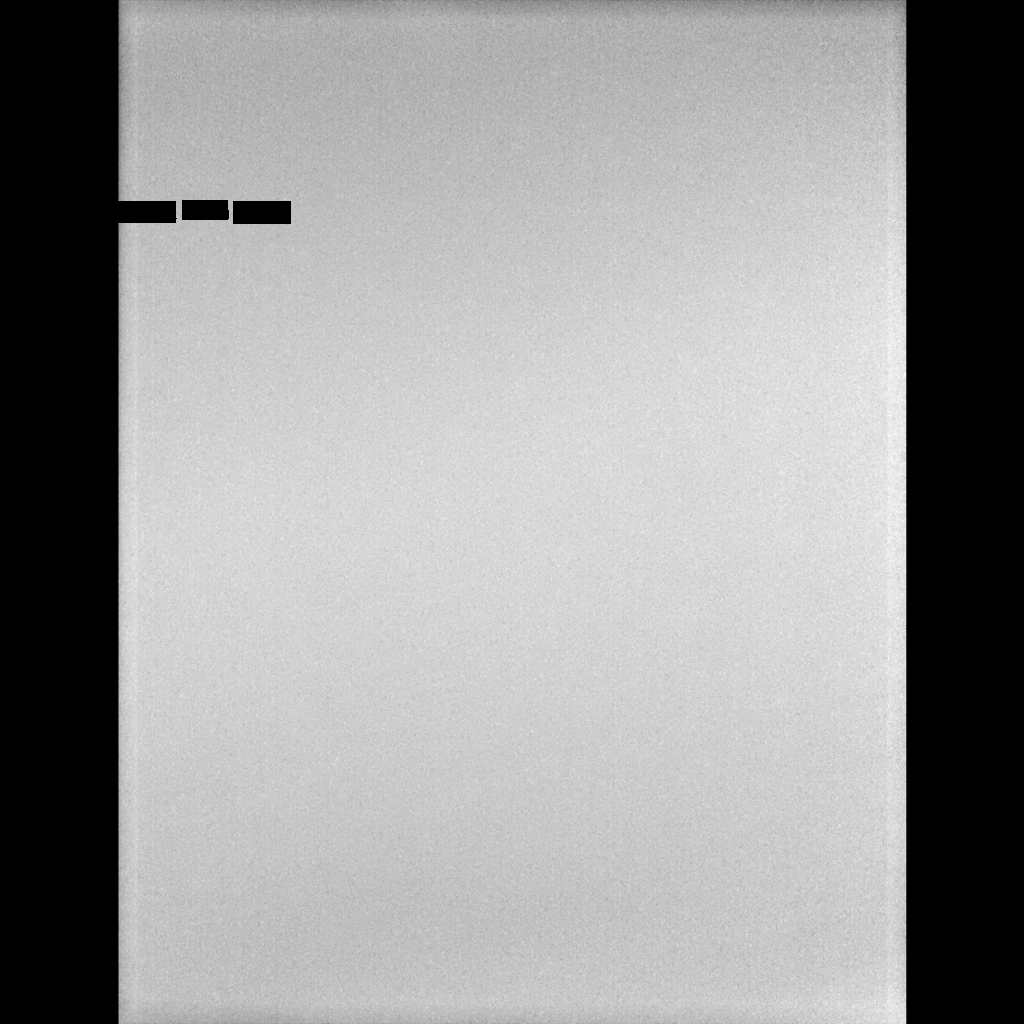

[1 of 1 positions shown; findings below may reference images not displayed]

Procedures Performed:
1. Manipulation the right pleural drain including saline flushes
and wire cannulation
2.  Successful aspiration of 1.35 liters of yellow serous pleural
fluid

PROCEDURE/FINDINGS:

The external portion of the right tunneled pleural drainage
catheter was prepped and draped in the usual fashion.  Guide was
removed from the tubing.  The catheter was then flushed with a hand
injection of saline.  The catheter flushes easily but would not
aspirate.  Therefore, a glide wire was advanced through the lumen
of the catheter and manipulated.  This was performed several times.
The catheter began to drain spontaneously.  The catheter was hooked
to the normal Aspira drainage bag.  The catheter was draining
albeit very slowly.  To expedite the process and provide maximal
drainage, the catheter was hooked up to an evacuated collection
bottle (Vacutainer).  A total of 2611 ml of clear yellow ascitic
fluid was successfully aspirated.

The patient tolerated the procedure well.  There was no immediate
complication.
IMPRESSION: Successful aspiration of 1.35 liters of serous pleural fluid from
the tunneled os.  Drainage catheter.

Of note, a significant degree of suction was required for
aspiration.  The catheter does drain into the drainage bag albeit
very slowly.

Recommend having the patient lay as supine as she can tolerate
during drainage attempts.  Also recommend leaving the bag in a
gravity dependent position and attempted to drain for at least 1 -
2 hours as the drainage will likely be very slow.  Consider
draining every other day.

Findings were discussed with Dr. Au by Dr. Kesu
immediately following the procedure.
